# Patient Record
Sex: Female | Born: 1935 | Race: White | Hispanic: No | Smoking: Never smoker
Health system: Southern US, Community
[De-identification: ages and names within clinical notes are randomized; demographics above are authoritative.]

## PROBLEM LIST (undated history)

## (undated) DIAGNOSIS — D369 Benign neoplasm, unspecified site: Secondary | ICD-10-CM

## (undated) DIAGNOSIS — I340 Nonrheumatic mitral (valve) insufficiency: Secondary | ICD-10-CM

## (undated) DIAGNOSIS — N390 Urinary tract infection, site not specified: Secondary | ICD-10-CM

## (undated) DIAGNOSIS — K219 Gastro-esophageal reflux disease without esophagitis: Secondary | ICD-10-CM

## (undated) DIAGNOSIS — M1990 Unspecified osteoarthritis, unspecified site: Secondary | ICD-10-CM

## (undated) DIAGNOSIS — K5730 Diverticulosis of large intestine without perforation or abscess without bleeding: Secondary | ICD-10-CM

## (undated) DIAGNOSIS — K589 Irritable bowel syndrome without diarrhea: Secondary | ICD-10-CM

## (undated) DIAGNOSIS — R55 Syncope and collapse: Secondary | ICD-10-CM

## (undated) DIAGNOSIS — I5032 Chronic diastolic (congestive) heart failure: Secondary | ICD-10-CM

## (undated) DIAGNOSIS — R911 Solitary pulmonary nodule: Secondary | ICD-10-CM

## (undated) DIAGNOSIS — R569 Unspecified convulsions: Secondary | ICD-10-CM

## (undated) DIAGNOSIS — K648 Other hemorrhoids: Secondary | ICD-10-CM

## (undated) DIAGNOSIS — G629 Polyneuropathy, unspecified: Secondary | ICD-10-CM

## (undated) HISTORY — DX: Chronic diastolic (congestive) heart failure: I50.32

## (undated) HISTORY — PX: OTHER SURGICAL HISTORY: SHX169

## (undated) HISTORY — PX: TOTAL ABDOMINAL HYSTERECTOMY: SHX209

## (undated) HISTORY — DX: Other hemorrhoids: K64.8

## (undated) HISTORY — DX: Diverticulosis of large intestine without perforation or abscess without bleeding: K57.30

## (undated) HISTORY — DX: Benign neoplasm, unspecified site: D36.9

## (undated) HISTORY — PX: CHOLECYSTECTOMY: SHX55

## (undated) HISTORY — DX: Irritable bowel syndrome, unspecified: K58.9

## (undated) HISTORY — DX: Polyneuropathy, unspecified: G62.9

## (undated) HISTORY — DX: Solitary pulmonary nodule: R91.1

## (undated) HISTORY — PX: WRIST SURGERY: SHX841

## (undated) HISTORY — DX: Syncope and collapse: R55

## (undated) HISTORY — PX: BACK SURGERY: SHX140

## (undated) HISTORY — PX: APPENDECTOMY: SHX54

## (undated) HISTORY — PX: FOOT SURGERY: SHX648

## (undated) HISTORY — DX: Gastro-esophageal reflux disease without esophagitis: K21.9

## (undated) HISTORY — DX: Nonrheumatic mitral (valve) insufficiency: I34.0

---

## 2001-10-10 ENCOUNTER — Encounter: Admission: RE | Admit: 2001-10-10 | Discharge: 2001-10-10 | Payer: Self-pay | Admitting: Rheumatology

## 2001-10-10 ENCOUNTER — Encounter: Payer: Self-pay | Admitting: Rheumatology

## 2001-12-20 ENCOUNTER — Encounter: Admission: RE | Admit: 2001-12-20 | Discharge: 2001-12-20 | Payer: Self-pay | Admitting: Family Medicine

## 2001-12-20 ENCOUNTER — Encounter (HOSPITAL_COMMUNITY): Admission: RE | Admit: 2001-12-20 | Discharge: 2002-01-19 | Payer: Self-pay | Admitting: Family Medicine

## 2002-01-02 ENCOUNTER — Ambulatory Visit (HOSPITAL_COMMUNITY): Admission: RE | Admit: 2002-01-02 | Discharge: 2002-01-02 | Payer: Self-pay | Admitting: Internal Medicine

## 2002-03-07 ENCOUNTER — Encounter (HOSPITAL_COMMUNITY): Admission: RE | Admit: 2002-03-07 | Discharge: 2002-04-06 | Payer: Self-pay | Admitting: Rheumatology

## 2002-04-25 ENCOUNTER — Encounter (HOSPITAL_COMMUNITY): Admission: RE | Admit: 2002-04-25 | Discharge: 2002-05-25 | Payer: Self-pay | Admitting: Rheumatology

## 2002-12-26 ENCOUNTER — Ambulatory Visit (HOSPITAL_COMMUNITY): Admission: RE | Admit: 2002-12-26 | Discharge: 2002-12-26 | Payer: Self-pay | Admitting: Orthopaedic Surgery

## 2002-12-26 ENCOUNTER — Encounter: Payer: Self-pay | Admitting: Orthopaedic Surgery

## 2003-10-06 ENCOUNTER — Ambulatory Visit (HOSPITAL_COMMUNITY): Admission: RE | Admit: 2003-10-06 | Discharge: 2003-10-06 | Payer: Self-pay | Admitting: Orthopaedic Surgery

## 2003-10-06 ENCOUNTER — Encounter: Payer: Self-pay | Admitting: Orthopaedic Surgery

## 2005-02-04 ENCOUNTER — Encounter: Admission: RE | Admit: 2005-02-04 | Discharge: 2005-02-04 | Payer: Self-pay | Admitting: Vascular Surgery

## 2005-02-14 ENCOUNTER — Ambulatory Visit: Payer: Self-pay | Admitting: Internal Medicine

## 2005-06-06 ENCOUNTER — Ambulatory Visit: Payer: Self-pay | Admitting: Internal Medicine

## 2005-06-16 ENCOUNTER — Ambulatory Visit: Payer: Self-pay | Admitting: Internal Medicine

## 2005-06-16 ENCOUNTER — Ambulatory Visit (HOSPITAL_COMMUNITY): Admission: RE | Admit: 2005-06-16 | Discharge: 2005-06-16 | Payer: Self-pay | Admitting: Internal Medicine

## 2005-06-16 HISTORY — PX: COLONOSCOPY: SHX174

## 2005-06-20 ENCOUNTER — Ambulatory Visit (HOSPITAL_COMMUNITY): Admission: RE | Admit: 2005-06-20 | Discharge: 2005-06-20 | Payer: Self-pay | Admitting: Internal Medicine

## 2005-06-30 ENCOUNTER — Ambulatory Visit (HOSPITAL_COMMUNITY): Admission: RE | Admit: 2005-06-30 | Discharge: 2005-06-30 | Payer: Self-pay | Admitting: Orthopaedic Surgery

## 2005-08-01 ENCOUNTER — Ambulatory Visit: Payer: Self-pay | Admitting: Internal Medicine

## 2005-08-02 ENCOUNTER — Encounter (HOSPITAL_COMMUNITY): Admission: RE | Admit: 2005-08-02 | Discharge: 2005-09-01 | Payer: Self-pay | Admitting: Internal Medicine

## 2005-10-13 ENCOUNTER — Ambulatory Visit: Payer: Self-pay | Admitting: Internal Medicine

## 2005-12-01 ENCOUNTER — Ambulatory Visit: Payer: Self-pay | Admitting: Internal Medicine

## 2006-09-29 ENCOUNTER — Ambulatory Visit: Payer: Self-pay | Admitting: Internal Medicine

## 2006-12-16 ENCOUNTER — Ambulatory Visit: Payer: Self-pay | Admitting: Cardiology

## 2006-12-18 ENCOUNTER — Encounter: Payer: Self-pay | Admitting: Cardiology

## 2006-12-21 ENCOUNTER — Encounter (INDEPENDENT_AMBULATORY_CARE_PROVIDER_SITE_OTHER): Payer: Self-pay | Admitting: Specialist

## 2006-12-21 ENCOUNTER — Ambulatory Visit (HOSPITAL_COMMUNITY): Admission: RE | Admit: 2006-12-21 | Discharge: 2006-12-21 | Payer: Self-pay | Admitting: Internal Medicine

## 2006-12-21 ENCOUNTER — Ambulatory Visit: Payer: Self-pay | Admitting: Internal Medicine

## 2006-12-21 HISTORY — PX: ESOPHAGOGASTRODUODENOSCOPY: SHX1529

## 2007-04-09 ENCOUNTER — Ambulatory Visit: Payer: Self-pay | Admitting: Internal Medicine

## 2007-04-12 ENCOUNTER — Encounter (HOSPITAL_COMMUNITY): Admission: RE | Admit: 2007-04-12 | Discharge: 2007-05-12 | Payer: Self-pay | Admitting: Orthopaedic Surgery

## 2007-11-28 ENCOUNTER — Ambulatory Visit: Payer: Self-pay | Admitting: Internal Medicine

## 2008-02-20 ENCOUNTER — Ambulatory Visit: Payer: Self-pay | Admitting: Cardiology

## 2008-02-22 ENCOUNTER — Ambulatory Visit: Payer: Self-pay | Admitting: Cardiology

## 2008-02-27 ENCOUNTER — Ambulatory Visit: Payer: Self-pay | Admitting: Cardiology

## 2008-02-27 ENCOUNTER — Encounter: Payer: Self-pay | Admitting: Physician Assistant

## 2008-03-10 ENCOUNTER — Ambulatory Visit (HOSPITAL_COMMUNITY): Admission: RE | Admit: 2008-03-10 | Discharge: 2008-03-10 | Payer: Self-pay | Admitting: Orthopaedic Surgery

## 2008-03-13 ENCOUNTER — Ambulatory Visit: Payer: Self-pay | Admitting: Cardiology

## 2008-03-17 ENCOUNTER — Encounter: Payer: Self-pay | Admitting: Cardiology

## 2008-03-31 ENCOUNTER — Ambulatory Visit: Payer: Self-pay | Admitting: Cardiology

## 2008-05-05 ENCOUNTER — Ambulatory Visit: Payer: Self-pay | Admitting: Internal Medicine

## 2008-05-05 ENCOUNTER — Ambulatory Visit: Payer: Self-pay

## 2008-05-15 ENCOUNTER — Ambulatory Visit (HOSPITAL_COMMUNITY): Admission: RE | Admit: 2008-05-15 | Discharge: 2008-05-15 | Payer: Self-pay | Admitting: Internal Medicine

## 2008-05-15 ENCOUNTER — Ambulatory Visit: Payer: Self-pay | Admitting: Internal Medicine

## 2008-06-04 ENCOUNTER — Ambulatory Visit: Payer: Self-pay | Admitting: Internal Medicine

## 2008-06-18 ENCOUNTER — Encounter: Admission: RE | Admit: 2008-06-18 | Discharge: 2008-06-18 | Payer: Self-pay | Admitting: Neurology

## 2008-10-26 ENCOUNTER — Encounter: Payer: Self-pay | Admitting: Cardiology

## 2008-10-27 ENCOUNTER — Encounter: Payer: Self-pay | Admitting: Cardiology

## 2008-12-30 ENCOUNTER — Encounter: Payer: Self-pay | Admitting: Cardiology

## 2009-01-07 ENCOUNTER — Ambulatory Visit: Payer: Self-pay | Admitting: Cardiology

## 2009-01-12 ENCOUNTER — Encounter: Payer: Self-pay | Admitting: Cardiology

## 2009-01-14 ENCOUNTER — Encounter: Payer: Self-pay | Admitting: Cardiology

## 2009-02-17 ENCOUNTER — Encounter: Payer: Self-pay | Admitting: Cardiology

## 2009-02-25 ENCOUNTER — Encounter: Payer: Self-pay | Admitting: Cardiology

## 2009-02-25 ENCOUNTER — Encounter: Payer: Self-pay | Admitting: Urgent Care

## 2009-02-25 ENCOUNTER — Encounter: Payer: Self-pay | Admitting: *Deleted

## 2009-02-25 ENCOUNTER — Ambulatory Visit: Payer: Self-pay | Admitting: Internal Medicine

## 2009-02-25 DIAGNOSIS — K5730 Diverticulosis of large intestine without perforation or abscess without bleeding: Secondary | ICD-10-CM | POA: Insufficient documentation

## 2009-02-25 DIAGNOSIS — K219 Gastro-esophageal reflux disease without esophagitis: Secondary | ICD-10-CM | POA: Insufficient documentation

## 2009-02-25 DIAGNOSIS — L290 Pruritus ani: Secondary | ICD-10-CM | POA: Insufficient documentation

## 2009-02-25 DIAGNOSIS — Z91041 Radiographic dye allergy status: Secondary | ICD-10-CM | POA: Insufficient documentation

## 2009-02-25 DIAGNOSIS — K589 Irritable bowel syndrome without diarrhea: Secondary | ICD-10-CM | POA: Insufficient documentation

## 2009-03-02 ENCOUNTER — Ambulatory Visit: Payer: Self-pay | Admitting: Cardiology

## 2009-04-30 ENCOUNTER — Encounter: Payer: Self-pay | Admitting: Cardiology

## 2009-05-04 ENCOUNTER — Encounter: Payer: Self-pay | Admitting: Gastroenterology

## 2009-05-05 ENCOUNTER — Encounter: Payer: Self-pay | Admitting: Cardiology

## 2009-08-14 ENCOUNTER — Telehealth (INDEPENDENT_AMBULATORY_CARE_PROVIDER_SITE_OTHER): Payer: Self-pay

## 2009-08-14 ENCOUNTER — Encounter: Payer: Self-pay | Admitting: Cardiology

## 2009-08-17 ENCOUNTER — Telehealth (INDEPENDENT_AMBULATORY_CARE_PROVIDER_SITE_OTHER): Payer: Self-pay

## 2009-09-23 DIAGNOSIS — R55 Syncope and collapse: Secondary | ICD-10-CM | POA: Insufficient documentation

## 2009-10-15 ENCOUNTER — Ambulatory Visit: Payer: Self-pay | Admitting: Cardiology

## 2009-10-15 DIAGNOSIS — K2289 Other specified disease of esophagus: Secondary | ICD-10-CM | POA: Insufficient documentation

## 2009-10-15 DIAGNOSIS — K228 Other specified diseases of esophagus: Secondary | ICD-10-CM

## 2009-10-15 DIAGNOSIS — J984 Other disorders of lung: Secondary | ICD-10-CM | POA: Insufficient documentation

## 2009-10-15 DIAGNOSIS — K449 Diaphragmatic hernia without obstruction or gangrene: Secondary | ICD-10-CM | POA: Insufficient documentation

## 2009-10-15 DIAGNOSIS — H8110 Benign paroxysmal vertigo, unspecified ear: Secondary | ICD-10-CM | POA: Insufficient documentation

## 2009-10-22 ENCOUNTER — Ambulatory Visit: Payer: Self-pay | Admitting: Internal Medicine

## 2009-10-23 ENCOUNTER — Ambulatory Visit: Payer: Self-pay | Admitting: Internal Medicine

## 2009-10-23 DIAGNOSIS — R198 Other specified symptoms and signs involving the digestive system and abdomen: Secondary | ICD-10-CM | POA: Insufficient documentation

## 2009-10-28 ENCOUNTER — Ambulatory Visit (HOSPITAL_COMMUNITY): Admission: RE | Admit: 2009-10-28 | Discharge: 2009-10-28 | Payer: Self-pay | Admitting: Internal Medicine

## 2009-10-30 ENCOUNTER — Encounter: Payer: Self-pay | Admitting: Internal Medicine

## 2009-11-06 ENCOUNTER — Encounter: Payer: Self-pay | Admitting: Cardiology

## 2009-11-16 ENCOUNTER — Encounter: Payer: Self-pay | Admitting: Gastroenterology

## 2009-11-16 ENCOUNTER — Encounter: Payer: Self-pay | Admitting: Cardiology

## 2009-11-24 ENCOUNTER — Encounter: Payer: Self-pay | Admitting: Internal Medicine

## 2009-11-25 ENCOUNTER — Encounter: Admission: RE | Admit: 2009-11-25 | Discharge: 2009-12-17 | Payer: Self-pay | Admitting: Cardiology

## 2009-12-15 ENCOUNTER — Telehealth (INDEPENDENT_AMBULATORY_CARE_PROVIDER_SITE_OTHER): Payer: Self-pay | Admitting: *Deleted

## 2009-12-19 DIAGNOSIS — R569 Unspecified convulsions: Secondary | ICD-10-CM

## 2009-12-19 HISTORY — DX: Unspecified convulsions: R56.9

## 2009-12-25 ENCOUNTER — Encounter: Payer: Self-pay | Admitting: Cardiology

## 2009-12-29 ENCOUNTER — Encounter: Payer: Self-pay | Admitting: Cardiology

## 2010-01-20 ENCOUNTER — Encounter: Payer: Self-pay | Admitting: Cardiology

## 2010-01-26 ENCOUNTER — Encounter: Payer: Self-pay | Admitting: Cardiology

## 2010-02-02 ENCOUNTER — Encounter: Payer: Self-pay | Admitting: Cardiology

## 2010-05-27 ENCOUNTER — Encounter: Payer: Self-pay | Admitting: Cardiology

## 2010-06-10 ENCOUNTER — Ambulatory Visit: Payer: Self-pay | Admitting: Cardiology

## 2010-06-10 DIAGNOSIS — I38 Endocarditis, valve unspecified: Secondary | ICD-10-CM | POA: Insufficient documentation

## 2010-06-10 DIAGNOSIS — I2789 Other specified pulmonary heart diseases: Secondary | ICD-10-CM | POA: Insufficient documentation

## 2010-08-03 ENCOUNTER — Encounter: Payer: Self-pay | Admitting: Urgent Care

## 2010-08-04 ENCOUNTER — Encounter (INDEPENDENT_AMBULATORY_CARE_PROVIDER_SITE_OTHER): Payer: Self-pay | Admitting: *Deleted

## 2010-08-04 ENCOUNTER — Encounter: Payer: Self-pay | Admitting: Gastroenterology

## 2010-08-24 ENCOUNTER — Ambulatory Visit: Payer: Self-pay | Admitting: Internal Medicine

## 2010-08-24 DIAGNOSIS — R109 Unspecified abdominal pain: Secondary | ICD-10-CM | POA: Insufficient documentation

## 2010-08-25 ENCOUNTER — Telehealth (INDEPENDENT_AMBULATORY_CARE_PROVIDER_SITE_OTHER): Payer: Self-pay | Admitting: *Deleted

## 2010-08-26 ENCOUNTER — Encounter: Payer: Self-pay | Admitting: Cardiology

## 2010-08-31 ENCOUNTER — Encounter: Payer: Self-pay | Admitting: Cardiology

## 2010-09-01 ENCOUNTER — Ambulatory Visit: Payer: Self-pay | Admitting: Internal Medicine

## 2010-09-02 ENCOUNTER — Encounter (INDEPENDENT_AMBULATORY_CARE_PROVIDER_SITE_OTHER): Payer: Self-pay

## 2010-09-03 ENCOUNTER — Encounter: Payer: Self-pay | Admitting: Internal Medicine

## 2010-09-03 LAB — CONVERTED CEMR LAB
Albumin: 3.9 g/dL (ref 3.5–5.2)
BUN: 13 mg/dL (ref 6–23)
CO2: 28 meq/L (ref 19–32)
Calcium: 9.6 mg/dL (ref 8.4–10.5)
Chloride: 109 meq/L (ref 96–112)
Creatinine, Ser: 0.79 mg/dL (ref 0.40–1.20)
Glucose, Bld: 60 mg/dL — ABNORMAL LOW (ref 70–99)
Hemoglobin: 14.4 g/dL (ref 12.0–15.0)
Lymphocytes Relative: 32 % (ref 12–46)
Lymphs Abs: 1.8 10*3/uL (ref 0.7–4.0)
MCHC: 32.5 g/dL (ref 30.0–36.0)
Monocytes Absolute: 0.5 10*3/uL (ref 0.1–1.0)
Monocytes Relative: 8 % (ref 3–12)
Neutro Abs: 3.1 10*3/uL (ref 1.7–7.7)
Neutrophils Relative %: 54 % (ref 43–77)
Potassium: 4.4 meq/L (ref 3.5–5.3)
RBC: 5.08 M/uL (ref 3.87–5.11)
TSH: 5.694 microintl units/mL — ABNORMAL HIGH (ref 0.350–4.500)
WBC: 5.7 10*3/uL (ref 4.0–10.5)

## 2010-09-06 ENCOUNTER — Encounter: Payer: Self-pay | Admitting: Gastroenterology

## 2010-09-16 ENCOUNTER — Encounter: Payer: Self-pay | Admitting: Cardiology

## 2010-09-17 ENCOUNTER — Encounter: Payer: Self-pay | Admitting: Cardiology

## 2010-09-22 ENCOUNTER — Encounter (INDEPENDENT_AMBULATORY_CARE_PROVIDER_SITE_OTHER): Payer: Self-pay | Admitting: *Deleted

## 2010-11-18 ENCOUNTER — Ambulatory Visit: Payer: Self-pay | Admitting: Internal Medicine

## 2010-12-15 ENCOUNTER — Encounter: Payer: Self-pay | Admitting: Cardiology

## 2010-12-15 ENCOUNTER — Ambulatory Visit: Payer: Self-pay | Admitting: Cardiology

## 2011-01-18 NOTE — Assessment & Plan Note (Signed)
Summary: follow up visit - cdg   Visit Type:  Follow-up Visit Primary Care Provider:  sassor  Chief Complaint:  follow up- abd pain before and after bm.  History of Present Illness: Followup GERD. No reflux symptoms - taking Dexilant 60 mg orally twice daily. Cardiology stopped her flu cortisone recently. She lost her sister with whom she was very close back in July of this year. She states she's not been hungry. She has lost 10 pounds since November of last year. Upper GI series demonstrated a moderate size hiatal hernia and esophageal dysmotility. Stool was negative for blood. Last colonoscopy 2006-diverticulosis. Has some vague midabdominal pain from time to time; states she does not get hungry. Gallbladder is out. No nausea or vomiting melena or hematochezia.  Current Medications (verified): 1)  Colace 100 Mg Caps (Docusate Sodium) .... Take One By Mouth On Mon-Wed-Fri 2)  Levothyroxine Sodium 50 Mcg Tabs (Levothyroxine Sodium) .... Take 1 Tablet By Mouth Once A Day 3)  [facility] Spectravite Senior  Tabs (Multiple Vitamins-Minerals) .... Take 1 Tablet By Mouth Once A Day 4)  Calcium Carbonate 600 Mg Tabs (Calcium Carbonate) .... One By Mouth Daily 5)  Triple Flex 500-400-125 Mg Tabs (Glucosamine-Chondroitin-Msm) .... Take 1 Tablet By Mouth Once A Day 6)  Folic Acid 800 Mcg Tabs (Folic Acid) .... Take 1 Tablet By Mouth Once A Day 7)  Dexilant 60 Mg Cpdr (Dexlansoprazole) .... Take 1 Tablet By Mouth Two Times A Day 8)  Topiramate 25 Mg Tabs (Topiramate) .Marland Kitchen.. 1 By Mouth Qam & 2 By Mouth Qpm 9)  Vitamin D 400 Unit Caps (Cholecalciferol) .... Take 1 Tablet By Mouth Once A Day 10)  [facility]vs Cranberry 475 Mg Caps (Cranberry) .... Once Daily 11)  Flonase 50 Mcg/act Susp (Fluticasone Propionate) .... As Needed 12)  Align  Caps (Probiotic Product) .... Once Daily 13)  Lidocaine-Hydrocortisone Ace 3-0.5 % Crea (Lidocaine-Hydrocortisone Ace) .... As Needed 14)  Gabapentin 300 Mg Caps (Gabapentin) .... Take 1  Tablet By Mouth Once A Day 15)  Singulair 10 Mg Tabs (Montelukast Sodium) .... Take 1 Tablet By Mouth Once A Day 16)  Vitamin C 500 Mg Tabs (Ascorbic Acid) .... Take 1 Tablet By Mouth Once A Day 17)  Super B Complex  Tabs (B Complex-C) .... Take 1 Tablet By Mouth Once A Day 18)  Loratadine 10 Mg Tabs (Loratadine) .... Once Daily  Allergies (verified): 1)  ! Indocin 2)  ! Cephalexin 3)  ! Morphine 4)  ! Penicillin 5)  ! Codeine 6)  ! Levaquin 7)  ! * Ivp Dye 8)  ! Levaquin 9)  ! Bactrim 10)  * Contrast Dye  Past History:  Past Medical History: Last updated: 10/15/2009 SYNCOPE AND COLLAPSE (ICD-780.2) CONTRAST DYE ALLERGY (ICD-V15.08) DIVERTICULOSIS OF COLON (ICD-562.10) PRURITUS ANI (ICD-698.0) IRRITABLE BOWEL SYNDROME (ICD-564.1) GERD (ICD-530.81) Peripheral polyneuropathy Migraine headaches  1. Syncope with recurrence.   2. Positive tilt-table test on Florinef.   3. Peripheral polyneuropathy confirmed by EMG and nerve conduction       velocities.   4. Migraine headaches.   5. Ruled out for pulmonary embolism in 2007.   6. No evidence of structural heart disease.   Past Surgical History: Last updated: 09/23/2009 wrist foot Abdominal Hysterectomy-Total Appendectomy Back Surgery  Family History: Last updated: September 10, 2010 Father: deceased Mother: deceased Siblings: 6 brothers- 1 living, 5 sisters- 2 living No FH of Colon Cancer:  Social History: Last updated: 09/23/2009 Retired  Widowed  Tobacco Use - No.  Alcohol Use - no Regular Exercise - yes Drug Use - no  Risk Factors: Exercise: yes (09/23/2009)  Risk Factors: Smoking Status: never (06/10/2010)  Family History: Father: deceased Mother: deceased Siblings: 6 brothers- 1 living, 5 sisters- 2 living No FH of Colon Cancer:  Vital Signs:  Patient profile:   77 year old female Height:      70 inches Weight:      202 pounds BMI:     29.09 Temp:     97.8 degrees F oral Pulse rate:   68 /  minute BP sitting:   108 / 70  (left arm) Cuff size:   regular  Vitals Entered By: Hendricks Limes LPN (August 24, 2010 8:49 AM)  Physical Exam  General:  pleasant alert or elderly lady in no distress Lungs:  clear to auscultation Heart:  regular rate rhythm without murmur gallop rub Abdomen:  flat positive bowel sounds soft nontender without appreciable mass or organomegaly  Impression & Recommendations: Impression: Pleasant elderly lady with long-standing GERD well-controlled on b.i.d. Dexalant.  Some anorexia recently with the loss of her sister. States she  just doesn't  get hungry. Has lost 10 pounds in the past 9 months.   GI evaluation to date as outlined above. I wonder if cessation of fludrocortisone has anything to do with her symptoms. Very vague abdominal pain doesn't appear to be significant at this time.  Recommendations: She may stop  taking her morning dose of Dexalant; continue taking Dexalant 60 mg orally in the evening before supper nightly. Check IFOBT, Chem-20, TSH and CBC. follow-up visit here 3 months. If she continues to have intermittent dizziness, she is urged to get back with Drs. DeGent and Sasser.  Other Orders: T-Comprehensive Metabolic Panel [id number]) T-CBC w/Diff [id number]) T-TSH [phone])  Appended Document: Orders Update    Clinical Lists Changes  Orders: Added new Service order of Est. Patient Level III (32440) - Signed

## 2011-01-18 NOTE — Letter (Signed)
Summary: Engineer, materials at [facility]  [address]   [place], [place] [address]   Phone: [phone]  Fax: [phone]        September 22, 2010 MRN: [id number]   Ladasia Dix [address] [place], [place]  [address]   Dear [name],  Your test ordered by Selena Batten has been reviewed by your physician (or physician assistant) and was found to be normal or stable. Your physician (or physician assistant) felt no changes were needed at this time.  ____ Echocardiogram  ____ Cardiac Stress Test  __X__ Lab Work - cortisol levels within normal limits   ____ Peripheral vascular study of arms, legs or neck  ____ CT scan or X-ray  ____ Lung or Breathing test  ____ Other:   Thank you.   [name], LPN    Duane Boston, M.D., F.A.C.C. Thressa Sheller, M.D., F.A.C.C. Oneal Grout, M.D., F.A.C.C. Cheree Ditto, M.D., F.A.C.C. Daiva Nakayama, M.D., F.A.C.C. Kenney Houseman, M.D., F.A.C.C. Jeanne Ivan, PA-C

## 2011-01-18 NOTE — Assessment & Plan Note (Signed)
Summary: 3 MO FU W/RMR/GERD,ABD PAIN/SS   Visit Type:  Follow-up Visit Primary Care Provider:  sasser  Chief Complaint:  follow up- was doing good untill thanksgiving- ate some chocolate.  History of Present Illness: 77 year old lady followup GERD. Patient has done extremely well on Dexalant 60 mg orally daily taken in the evenings. No dysphagia or odynaphagia. She states she overinduldged over Thanksgiving and ate  some chocolate ice cream  and developed reflux symptoms following.  She notes some constipation with Vesicare. She also notes a dry mouth.  Her cardiologist has put her back on Florinef.  Current Problems (verified): 1)  Abdominal Pain  (ICD-789.00) 2)  Hypertension, Pulmonary  (ICD-416.8) 3)  Valvular Heart Disease  (ICD-424.90) 4)  Other Symptoms Involving Digestive System Other  (ICD-787.99) 5)  Other Specified Disorder of The Esophagus  (ICD-530.89) 6)  Hiatal Hernia With Reflux  (ICD-553.3) 7)  Pulmonary Nodule  (ICD-518.89) 8)  Benign Positional Vertigo  (ICD-386.11) 9)  Vasovagal Syncope  (ICD-780.2) 10)  Syncope and Collapse  (ICD-780.2) 11)  Contrast Dye Allergy  (ICD-V15.08) 12)  Diverticulosis of Colon  (ICD-562.10) 13)  Pruritus Ani  (ICD-698.0) 14)  Irritable Bowel Syndrome  (ICD-564.1) 15)  Gerd  (ICD-530.81)  Current Medications (verified): 1)  Colace 100 Mg Caps (Docusate Sodium) .... Take One By Mouth On Mon-Wed-Fri 2)  Levothyroxine Sodium 50 Mcg Tabs (Levothyroxine Sodium) .... Take 1 Tablet By Mouth Once A Day 3)  [facility] Spectravite Senior  Tabs (Multiple Vitamins-Minerals) .... Take 1 Tablet By Mouth Once A Day 4)  Calcium Carbonate 600 Mg Tabs (Calcium Carbonate) .... One By Mouth Daily 5)  Triple Flex 500-400-125 Mg Tabs (Glucosamine-Chondroitin-Msm) .... Take 1 Tablet By Mouth Once A Day 6)  Folic Acid 800 Mcg Tabs (Folic Acid) .... Take 1 Tablet By Mouth Once A Day 7)  Dexilant 60 Mg Cpdr (Dexlansoprazole) .... One By Mouth Each Evening 8)   Topiramate 25 Mg Tabs (Topiramate) .Marland Kitchen.. 1 By Mouth Qam & 2 By Mouth Qpm 9)  Vitamin D 400 Unit Caps (Cholecalciferol) .... Take 1 Tablet By Mouth Once A Day 10)  [facility] Cranberry 475 Mg Caps (Cranberry) .... Once Daily 11)  Flonase 50 Mcg/act Susp (Fluticasone Propionate) .... As Needed 12)  Align  Caps (Probiotic Product) .... Once Daily 13)  Lidocaine-Hydrocortisone Ace 3-0.5 % Crea (Lidocaine-Hydrocortisone Ace) .... As Needed 14)  Gabapentin 300 Mg Caps (Gabapentin) .... Take 1 Tablet By Mouth Once A Day 15)  Singulair 10 Mg Tabs (Montelukast Sodium) .... Take 1 Tablet By Mouth Once A Day 16)  Vitamin C 500 Mg Tabs (Ascorbic Acid) .... Take 1 Tablet By Mouth Once A Day 17)  Super B Complex  Tabs (B Complex-C) .... Take 1 Tablet By Mouth Once A Day 18)  Loratadine 10 Mg Tabs (Loratadine) .... Once Daily 19)  Fludrocortisone Acetate 0.1 Mg Tabs (Fludrocortisone Acetate) .... Take One Tab On Monday, Wednesday, & Friday 20)  Vesicare 10 Mg Tabs (Solifenacin Succinate) .... Once Daily  Allergies (verified): 1)  ! Indocin 2)  ! Cephalexin 3)  ! Morphine 4)  ! Penicillin 5)  ! Codeine 6)  ! Levaquin 7)  ! * Ivp Dye 8)  ! Levaquin 9)  ! Bactrim 10)  * Contrast Dye  Past History:  Past Medical History: Last updated: 10/15/2009 SYNCOPE AND COLLAPSE (ICD-780.2) CONTRAST DYE ALLERGY (ICD-V15.08) DIVERTICULOSIS OF COLON (ICD-562.10) PRURITUS ANI (ICD-698.0) IRRITABLE BOWEL SYNDROME (ICD-564.1) GERD (ICD-530.81) Peripheral polyneuropathy Migraine headaches  1. Syncope with recurrence.  2. Positive tilt-table test on Florinef.   3. Peripheral polyneuropathy confirmed by EMG and nerve conduction       velocities.   4. Migraine headaches.   5. Ruled out for pulmonary embolism in 2007.   6. No evidence of structural heart disease.   Past Surgical History: Last updated: 09/23/2009 wrist foot Abdominal Hysterectomy-Total Appendectomy Back Surgery  Family History: Last updated:  Sep 08, 2010 Father: deceased Mother: deceased Siblings: 6 brothers- 1 living, 5 sisters- 2 living No FH of Colon Cancer:  Social History: Last updated: 09/23/2009 Retired  Widowed  Tobacco Use - No.  Alcohol Use - no Regular Exercise - yes Drug Use - no  Risk Factors: Exercise: yes (09/23/2009)  Risk Factors: Smoking Status: never (06/10/2010)  Vital Signs:  Patient profile:   77 year old female Height:      70 inches Weight:      205 pounds BMI:     29.52 Temp:     97.6 degrees F oral Pulse rate:   72 / minute BP sitting:   124 / 80  (left arm) Cuff size:   regular  Vitals Entered By: Hendricks Limes LPN (November 18, 2010 9:53 AM)  Physical Exam  General:  pleasant alert lady appears her baseline Abdomen:  somewhat obese positive bowel sounds soft, nontender without appreciable mass or organomegaly  Impression & Recommendations: Impression: 77 year old lady with long-standing GERD, symptoms really controlled overall very well with Dexalant 60 mg daily taken in the evening -  better than any prior regimens.  Recent constipation most likely related to the anticholinergic effects of Vesicare.  Recommendations: Continue Dexalant 60 mg orally daily indefinitely - samples provided.  Add Colace 100 mg orally b.i.d. to her regimen. I've also recommended MiraLax 17 g orally each day-  if no bowel movement on any given day..  Unless something comes up, plan to see this nice lady back in one year.  Appended Document: Orders Update    Clinical Lists Changes  Orders: Added new Service order of Est. Patient Level IV (85277) - Signed

## 2011-01-18 NOTE — Miscellaneous (Signed)
Summary: Orders Update - AM CORTISOL,SERUM ALDOSTERONE, PRA  Clinical Lists Changes  Orders: Added new Test order of T- * Misc. Laboratory test [phone]) - Signed

## 2011-01-18 NOTE — Medication Information (Signed)
Summary: DEXILANT DR 60 MG   DEXILANT DR 60 MG   Imported By: Rexene Alberts 08/04/2010 09:32:01  _____________________________________________________________________  External Attachment:    Type:   Image     Comment:   External Document  Appended Document: DEXILANT DR 60 MG  pt has an appt to see Dr Jena Gauss on 08/24/10- cdg

## 2011-01-18 NOTE — Letter (Signed)
Summary: Normal Results Letter  [facility]  [address]   [place], [place] [address]   Phone: [phone]  Fax: [phone]    September 02, 2010  [name] [address] [place], [place]  [address] 1930/02/08   Dear [name],   Our office has been trying to contact you.  We just wanted to inform you that your stool test was normal.  If you have any questions, please call the office at [phone].   Thank you,    Hendricks Limes, LPN Cloria Spring, LPN  [facility] Ph: [phone]   Fax: [phone]

## 2011-01-18 NOTE — Letter (Signed)
Summary: [facility]  [facility]   Imported By: Zachary George 01/20/2010 11:32:20  _____________________________________________________________________  External Attachment:    Type:   Image     Comment:   External Document

## 2011-01-18 NOTE — Medication Information (Signed)
Summary: DEXILANT DR 60MG   DEXILANT DR 60MG    Imported By: Rexene Alberts 08/04/2010 10:05:06  _____________________________________________________________________  External Attachment:    Type:   Image     Comment:   External Document  Appended Document: DEXILANT DR 60MG     Prescriptions: DEXILANT 60 MG CPDR (DEXLANSOPRAZOLE) Take 1 tablet by mouth two times a day  #60 x 0   Entered and Authorized by:   Leanna Battles. Dixon Boos   Signed by:   Leanna Battles Lewis PA-C on 08/04/2010   Method used:   [facility]  [address] [phone]* (retail)       [address]       [place], [place]  [address]       Ph: [phone]       Fax: [phone]   RxID:   [id number]

## 2011-01-18 NOTE — Letter (Signed)
Summary: [facility] Fara Chute  [facility] DR. PAUL SASSER   Imported By: Zachary George 06/10/2010 11:14:15  _____________________________________________________________________  External Attachment:    Type:   Image     Comment:   External Document

## 2011-01-18 NOTE — Miscellaneous (Signed)
Summary: Rehab Report/ THERAPY EVALUATION FAXED  Rehab Report/ THERAPY EVALUATION FAXED   Imported By: Dorise Hiss 01/28/2010 14:02:04  _____________________________________________________________________  External Attachment:    Type:   Image     Comment:   External Document

## 2011-01-18 NOTE — Medication Information (Signed)
Summary: DEXILANT DR 60MG   DEXILANT DR 60MG    Imported By: Rexene Alberts 08/03/2010 09:31:07  _____________________________________________________________________  External Attachment:    Type:   Image     Comment:   External Document  Appended Document: DEXILANT DR 60MG  needs OV 1st or can get from PCP.  NS x 2 previously.  Appended Document: DEXILANT DR 60MG  MAILED LETTER FOR PT TO CALL FOR APPT

## 2011-01-18 NOTE — Miscellaneous (Signed)
Summary: update med list - Florinef  Clinical Lists Changes  Medications: Added new medication of FLUDROCORTISONE ACETATE 0.1 MG TABS (FLUDROCORTISONE ACETATE) take one tab on Monday, Wednesday, & Friday

## 2011-01-18 NOTE — Assessment & Plan Note (Signed)
Summary: 6 month fu-recv call -vs  Medications Added COLACE 100 MG CAPS (DOCUSATE SODIUM) take one by mouth on Mon-Wed-Fri [facility] SPECTRAVITE SENIOR  TABS (MULTIPLE VITAMINS-MINERALS) Take 1 tablet by mouth once a day TRIPLE FLEX 500-400-125 MG TABS (GLUCOSAMINE-CHONDROITIN-MSM) Take 1 tablet by mouth once a day FOLIC ACID 800 MCG TABS (FOLIC ACID) Take 1 tablet by mouth once a day VITAMIN D 400 UNIT CAPS (CHOLECALCIFEROL) Take 1 tablet by mouth once a day GABAPENTIN 300 MG CAPS (GABAPENTIN) Take 1 tablet by mouth once a day SINGULAIR 10 MG TABS (MONTELUKAST SODIUM) Take 1 tablet by mouth once a day VITAMIN C 500 MG TABS (ASCORBIC ACID) Take 1 tablet by mouth once a day SUPER B COMPLEX  TABS (B COMPLEX-C) Take 1 tablet by mouth once a day        Visit Type:  Follow-up Primary Provider:  Drue Dun   History of Present Illness: 77 year old female, history of neurocardiogenic syncope, normal LVF, mild mitral regurgitation, and moderate pulmonary hypertension, presents for scheduled 6 month followup.  At time of last visit, Dr. Andee Lineman referred her for a BPV evaluation of her persistent dizziness. They concluded that her symptoms were not consistent with BPV, but that she appeared to have balance and vestibular deficits. She informs me today that she was also told that physical therapy would not be beneficial. She has not had any recurrent syncope, since her last visit here. However, she continues to experience intermittent gait instability, and occasionally uses a cane. She denies any recent falls.  Dr. Andee Lineman also referred her for a followup CT angiogram of the chest, for previously noted lung nodule. This indicated no evidence of neoplasm, and no further followup was recommended. A previously noted nodule in the right lower lobe was felt to be due to old infection.  Preventive Screening-Counseling & Management  Alcohol-Tobacco     Smoking Status: never  Current Medications  (verified): 1)  Colace 100 Mg Caps (Docusate Sodium) .... Take One By Mouth On Mon-Wed-Fri 2)  Levothyroxine Sodium 50 Mcg Tabs (Levothyroxine Sodium) .... Take 1 Tablet By Mouth Once A Day 3)  [facility] Spectravite Senior  Tabs (Multiple Vitamins-Minerals) .... Take 1 Tablet By Mouth Once A Day 4)  Calcium Carbonate 600 Mg Tabs (Calcium Carbonate) .... One By Mouth Daily 5)  Triple Flex 500-400-125 Mg Tabs (Glucosamine-Chondroitin-Msm) .... Take 1 Tablet By Mouth Once A Day 6)  Folic Acid 800 Mcg Tabs (Folic Acid) .... Take 1 Tablet By Mouth Once A Day 7)  Dexilant 60 Mg Cpdr (Dexlansoprazole) .... Take 1 Tablet By Mouth Two Times A Day 8)  Dilantin 100 Mg Caps (Phenytoin Sodium Extended) .... 2 By Mouth Once Daily Bedtime 9)  Topiramate 25 Mg Tabs (Topiramate) .Marland Kitchen.. 1 By Mouth Qam & 2 By Mouth Qpm 10)  Fludrocortisone Acetate 0.1 Mg Tabs (Fludrocortisone Acetate) .... One  On Mon Wed &fri 11)  Vitamin D 400 Unit Caps (Cholecalciferol) .... Take 1 Tablet By Mouth Once A Day 12)  [facility]vs Cranberry 475 Mg Caps (Cranberry) .... Once Daily 13)  Flonase 50 Mcg/act Susp (Fluticasone Propionate) .... As Needed 14)  Align  Caps (Probiotic Product) .... Once Daily 15)  Lidocaine-Hydrocortisone Ace 3-0.5 % Crea (Lidocaine-Hydrocortisone Ace) .... As Needed 16)  Gabapentin 300 Mg Caps (Gabapentin) .... Take 1 Tablet By Mouth Once A Day 17)  Singulair 10 Mg Tabs (Montelukast Sodium) .... Take 1 Tablet By Mouth Once A Day 18)  Vitamin C 500 Mg Tabs (Ascorbic Acid) .Marland KitchenMarland KitchenMarland Kitchen  Take 1 Tablet By Mouth Once A Day 19)  Super B Complex  Tabs (B Complex-C) .... Take 1 Tablet By Mouth Once A Day  Allergies: 1)  ! Indocin 2)  ! Cephalexin 3)  ! Morphine 4)  ! Penicillin 5)  ! Codeine 6)  ! Levaquin 7)  ! * Ivp Dye 8)  ! Levaquin 9)  ! Bactrim 10)  * Contrast Dye  Comments:  Nurse/Medical Assistant: The patient's medication list and allergies were reviewed with the patient and were updated in the Medication and Allergy  Lists.  Past History:  Past Medical History: Last updated: 10/15/2009 SYNCOPE AND COLLAPSE (ICD-780.2) CONTRAST DYE ALLERGY (ICD-V15.08) DIVERTICULOSIS OF COLON (ICD-562.10) PRURITUS ANI (ICD-698.0) IRRITABLE BOWEL SYNDROME (ICD-564.1) GERD (ICD-530.81) Peripheral polyneuropathy Migraine headaches  1. Syncope with recurrence.   2. Positive tilt-table test on Florinef.   3. Peripheral polyneuropathy confirmed by EMG and nerve conduction       velocities.   4. Migraine headaches.   5. Ruled out for pulmonary embolism in 2007.   6. No evidence of structural heart disease.   Review of Systems  The patient denies chest pain, syncope, and dyspnea on exertion.         denies tachypalpitations. Remaining systems reviewed, and are negative.  Vital Signs:  Patient profile:   77 year old female Height:      70 inches Weight:      203 pounds Pulse rate:   70 / minute BP sitting:   141 / 115  (left arm) Cuff size:   large  Vitals Entered By: Carlye Grippe (June 10, 2010 10:40 AM)  Serial Vital Signs/Assessments:  Time      Position  BP       Pulse  Resp  Temp     By 11:20 AM  R Arm     109/74                         Cyril Loosen, RN, BSN 11:20 AM  L Arm     106/72                         Cyril Loosen, RN, BSN   Physical Exam  Additional Exam:  GEN:77 year old female, sitting upright, in no distress HEENT: NCAT,PERRLA,EOMI NECK: palpable pulses, no bruits; no JVD; no TM LUNGS: CTA bilaterally HEART: RRR (S1S2); no significant murmurs; no rubs; no gallops ABD: soft, NT; intact BS EXT: intact distal pulses; no edema SKIN: warm, dry MUSC: no obvious deformity NEURO: A/O (x3)     Impression & Recommendations:  Problem # 1:  SYNCOPE AND COLLAPSE (ICD-780.2) no recurrent episodes, since last visit here. Subsequent evaluation for BPV proved negative, but suggested balance and vestibular deficits. Patient does suggest symptoms of gait instability, but no recent falls.  She occasionally ambulates with the use of a cane. No further cardiac workup at this time. Will schedule a return clinic visit with Dr. Andee Lineman in 6 months.  Problem # 2:  PULMONARY NODULE (ICD-518.89) subsequent followup CT angiogram of the chest was negative for evidence of neoplasm; no further workup was recommended.  Problem # 3:  VALVULAR HEART DISEASE (ICD-424.90) Previous echocardiogram from October '10 indicated mild MR. We'll continue to monitor clinically.   Problem # 4:  HYPERTENSION, PULMONARY (ICD-416.8) Assessed as moderate by 2-D echo, 8/10. Continue to monitor closely.  Patient Instructions: 1)  Your physician wants you to follow-up  in: 6 months. You will receive a reminder letter in the mail one-two months in advance. If you don't receive a letter, please call our office to schedule the follow-up appointment. 2)  Your physician recommends that you continue on your current medications as directed. Please refer to the Current Medication list given to you today.

## 2011-01-18 NOTE — Letter (Signed)
Summary: Plan of Care, Need to Discuss  [facility]  [address]   [place], [place] [address]   Phone: [phone]  Fax: [phone]    September 02, 2010  [name] [address] [place], [place]  [address] May 02, 1930   Dear [name],   We are writing this letter to inform you of treatment plans and/or discuss your plan of care.  We have tried several times to contact you; however, we have yet to reach you.  We ask that you please contact our office for follow-up on your gastrointestinal issues.  We can  be reached at [phone] to schedule an appointment, or to speak with someone regarding your health care needs.  Please do not neglect your health.   Sincerely,    Cloria Spring LPN  [facility] Ph: [phone]    Fax: [phone]

## 2011-01-18 NOTE — Medication Information (Signed)
Summary: [name] DR  [name] DR   Imported By: Rexene Alberts 09/06/2010 10:10:47  _____________________________________________________________________  External Attachment:    Type:   Image     Comment:   External Document  Appended Document: DEXILANT DR    Prescriptions: DEXILANT 60 MG CPDR (DEXLANSOPRAZOLE) one by mouth each evening  #30 x 11   Entered and Authorized by:   Leanna Battles. Dixon Boos   Signed by:   Leanna Battles Lewis PA-C on 09/06/2010   Method used:   [facility]        [facility]  [address] [id number]       [address]       [place], [place]  [address]       Ph: [phone] or [phone]       Fax: [phone]   RxID:   [id number]

## 2011-01-18 NOTE — Progress Notes (Signed)
Summary: restart Florinef   Phone Note Call from Patient   Summary of Call: Wanted return call, stated she had question for Dr. Andee Lineman.  Left message to return call.  [name], LPN  August 25, 2010 10:02 AM   Follow-up for Phone Call        Saw PA in June and said he did not mention whether or not she was to continue her Florinef.  States her bottle had no refills, MD must authorize.  She did not ask this at her OV.  Advised her that typically that means that she just needs to call the office to request refill and that it does not mean that she is to stop med.  Stated that she was just not comfortable restarting until she checks with Dr. Andee Lineman first.  Please advise, not due for f/u again till December.   Follow-up by: [name], LPN,  August 27, 2010 10:47 AM  Additional Follow-up for Phone Call Additional follow up Details #1::        Would keep on Florinef 0.1 mg by mouth M/W/F as previously taken.  Additional Follow-up by: [name], MD, [facility],  August 31, 2010 6:40 AM    Additional Follow-up for Phone Call Additional follow up Details #2::    Patient notified.   Advised 6 refills were approved on 6/29 to [facility]  States she wanted to pick up in [place] today.  Advised her that she can call and have transferred [facility] another [facility] location herself since refill has already been done.  Patient verbalized understanding.  Follow-up by: [name], LPN,  August 31, 2010 1:38 PM

## 2011-01-18 NOTE — Assessment & Plan Note (Signed)
Summary: DROPPED OFF STOOL/SS   Pt returned one iFOBT and it was negative.    Allergies: 1)  ! Indocin 2)  ! Cephalexin 3)  ! Morphine 4)  ! Penicillin 5)  ! Codeine 6)  ! Levaquin 7)  ! * Ivp Dye 8)  ! Levaquin 9)  ! Bactrim 10)  * Contrast Dye  Other Orders: Immuno-chemical Fecal Occult ([address])  Appended Document: DROPPED OFF STOOL/SS stool negative for blood; keep f/u appt as planned  Appended Document: DROPPED OFF STOOL/SS DS has been trying to contact pt for several days. Will mail letter to pt  Appended Document: DROPPED OFF STOOL/SS pt aware

## 2011-01-18 NOTE — Miscellaneous (Signed)
Summary: Rehab Report/ CARDIAC REHAB DISCHARGE SUMMARY [facility]   Rehab Report/ CARDIAC REHAB DISCHARGE SUMMARY [facility]   Imported By: Dorise Hiss 05/27/2010 15:02:18  _____________________________________________________________________  External Attachment:    Type:   Image     Comment:   External Document

## 2011-01-18 NOTE — Letter (Signed)
Summary: Recall Office Visit  [facility]  [address]   [place], [place] [address]   Phone: [phone]  Fax: [phone]      August 04, 2010   Marji Buzby [address] [place], [place]  [address] Apr 25, 1930   Dear [name],   According to our records, it is time for you to schedule a follow-up office visit with [place].   At your convenience, please call [phone] to schedule an office visit. If you have any questions, concerns, or feel that this letter is in error, we would appreciate your call.   Sincerely,    Diana Eves  [facility] Ph: [phone]   Fax: [phone]

## 2011-01-18 NOTE — Letter (Signed)
Summary: RECORDS FROM DR [facility] FROM DR SASSER   Imported By: Rexene Alberts 09/03/2010 10:44:29  _____________________________________________________________________  External Attachment:    Type:   Image     Comment:   External Document

## 2011-01-18 NOTE — Letter (Signed)
Summary: External Correspondence/ FAXED [facility]  External Correspondence/ FAXED [facility]   Imported By: Dorise Hiss 01/05/2010 12:02:33  _____________________________________________________________________  External Attachment:    Type:   Image     Comment:   External Document

## 2011-01-18 NOTE — Miscellaneous (Signed)
Summary: Rehab Report/ [facility]  Rehab Report/ [facility]   Imported By: Dorise Hiss 12/28/2009 15:01:23  _____________________________________________________________________  External Attachment:    Type:   Image     Comment:   External Document

## 2011-01-20 NOTE — Assessment & Plan Note (Signed)
Summary: f22m  --agh  Medications Added COLACE 100 MG CAPS (DOCUSATE SODIUM) take one by mouth daily TOPIRAMATE 25 MG TABS (TOPIRAMATE) Take 2 tablet by mouth two times a day CRANBERRY JUICE EXTRACT 1000 MG CAPS (CRANBERRY) Take 2 tablet by mouth once a day PREDNISONE 20 MG TABS (PREDNISONE) Take 1 tablet by mouth once a day      Allergies Added:   Visit Type:  Follow-up Primary Kaizlee Carlino:  sasser   History of Present Illness: the patient is a 77 year old female with a prior history of neurocardiogenic syncope, normal left ventricular function mild mitral regurgitation and moderate pulmonary hypertension. The patient's dizziness has improved. She has been maintained on Florinef. There was some question whether she had benign positional vertigo but this appeared not to be the case. She's also been followed with CT and chest for a previous lung nodule. This is felt to be stable now.  Patient has no history of significant structural heart disease. She denies any chest pain shortness of breath orthopnea PND. She does have some complaints of generalized weakness.X.  The patient also complains of neck and back pain and is followed by primary care physician for this problem    Preventive Screening-Counseling & Management  Alcohol-Tobacco     Smoking Status: never  Current Medications (verified): 1)  Colace 100 Mg Caps (Docusate Sodium) .... Take One By Mouth Daily 2)  Levothyroxine Sodium 50 Mcg Tabs (Levothyroxine Sodium) .... Take 1 Tablet By Mouth Once A Day 3)  [facility] Spectravite Senior  Tabs (Multiple Vitamins-Minerals) .... Take 1 Tablet By Mouth Once A Day 4)  Calcium Carbonate 600 Mg Tabs (Calcium Carbonate) .... One By Mouth Daily 5)  Triple Flex 500-400-125 Mg Tabs (Glucosamine-Chondroitin-Msm) .... Take 1 Tablet By Mouth Once A Day 6)  Folic Acid 800 Mcg Tabs (Folic Acid) .... Take 1 Tablet By Mouth Once A Day 7)  Dexilant 60 Mg Cpdr (Dexlansoprazole) .... One By Mouth Each  Evening 8)  Topiramate 25 Mg Tabs (Topiramate) .... Take 2 Tablet By Mouth Two Times A Day 9)  Cranberry Juice Extract 1000 Mg Caps (Cranberry) .... Take 2 Tablet By Mouth Once A Day 10)  Flonase 50 Mcg/act Susp (Fluticasone Propionate) .... As Needed 11)  Align  Caps (Probiotic Product) .... Once Daily 12)  Lidocaine-Hydrocortisone Ace 3-0.5 % Crea (Lidocaine-Hydrocortisone Ace) .... As Needed 13)  Singulair 10 Mg Tabs (Montelukast Sodium) .... Take 1 Tablet By Mouth Once A Day 14)  Vitamin C 500 Mg Tabs (Ascorbic Acid) .... Take 1 Tablet By Mouth Once A Day 15)  Super B Complex  Tabs (B Complex-C) .... Take 1 Tablet By Mouth Once A Day 16)  Loratadine 10 Mg Tabs (Loratadine) .... Once Daily 17)  Fludrocortisone Acetate 0.1 Mg Tabs (Fludrocortisone Acetate) .... Take One Tab On Monday, Wednesday, & Friday 18)  Vesicare 10 Mg Tabs (Solifenacin Succinate) .... Once Daily 19)  Prednisone 20 Mg Tabs (Prednisone) .... Take 1 Tablet By Mouth Once A Day  Allergies (verified): 1)  ! Indocin 2)  ! Cephalexin 3)  ! Morphine 4)  ! Penicillin 5)  ! Codeine 6)  ! Levaquin 7)  ! * Ivp Dye 8)  ! Levaquin 9)  ! Bactrim 10)  * Contrast Dye  Comments:  Nurse/Medical Assistant: The patient's medication list and allergies were reviewed with the patient and were updated in the Medication and Allergy Lists.  Past History:  Past Medical History: Last updated: 10/15/2009 SYNCOPE AND COLLAPSE (ICD-780.2) CONTRAST DYE  ALLERGY (ICD-V15.08) DIVERTICULOSIS OF COLON (ICD-562.10) PRURITUS ANI (ICD-698.0) IRRITABLE BOWEL SYNDROME (ICD-564.1) GERD (ICD-530.81) Peripheral polyneuropathy Migraine headaches  1. Syncope with recurrence.   2. Positive tilt-table test on Florinef.   3. Peripheral polyneuropathy confirmed by EMG and nerve conduction       velocities.   4. Migraine headaches.   5. Ruled out for pulmonary embolism in 2007.   6. No evidence of structural heart disease.   Past Surgical  History: Last updated: 09/23/2009 wrist foot Abdominal Hysterectomy-Total Appendectomy Back Surgery  Family History: Last updated: 29-Aug-2010 Father: deceased Mother: deceased Siblings: 6 brothers- 1 living, 5 sisters- 2 living No FH of Colon Cancer:  Social History: Last updated: 09/23/2009 Retired  Widowed  Tobacco Use - No.  Alcohol Use - no Regular Exercise - yes Drug Use - no  Risk Factors: Exercise: yes (09/23/2009)  Risk Factors: Smoking Status: never (12/15/2010)  Review of Systems       The patient complains of fatigue.  The patient denies malaise, fever, weight gain/loss, vision loss, decreased hearing, hoarseness, chest pain, palpitations, shortness of breath, prolonged cough, wheezing, sleep apnea, coughing up blood, abdominal pain, blood in stool, nausea, vomiting, diarrhea, heartburn, incontinence, blood in urine, muscle weakness, joint pain, leg swelling, rash, skin lesions, headache, fainting, dizziness, depression, anxiety, enlarged lymph nodes, easy bruising or bleeding, and environmental allergies.    Vital Signs:  Patient profile:   77 year old female Height:      70 inches Weight:      206 pounds Pulse rate:   66 / minute BP sitting:   113 / 79  (left arm) Cuff size:   large  Vitals Entered By: Carlye Grippe (December 15, 2010 10:10 AM)  Physical Exam  Additional Exam:  GEN:77 year old female, sitting upright, in no distress HEENT: NCAT,PERRLA,EOMI NECK: palpable pulses, no bruits; no JVD; no TM LUNGS: CTA bilaterally HEART: RRR (S1S2); no significant murmurs; no rubs; no gallops ABD: soft, NT; intact BS EXT: intact distal pulses; no edema SKIN: warm, dry MUSC: no obvious deformity NEURO: A/O (x3)     Impression & Recommendations:  Problem # 1:  VALVULAR HEART DISEASE (ICD-424.90) the patient has history of mild mitral regurgitation. She does however have pulmonary hypertension and therefore an echo will be scheduled in 6  months. Orders: EKG w/ Interpretation (93000)  Problem # 2:  PULMONARY NODULE (ICD-518.89) stable  Problem # 3:  SYNCOPE AND COLLAPSE (ICD-780.2) postictal table test and neurocardiogenic syncope improved with Florinef Her updated medication list for this problem includes:    Prednisone 20 Mg Tabs (Prednisone) .Marland Kitchen... Take 1 tablet by mouth once a day  Patient Instructions: 1)  Echo in 6 months prior to next visit 2)  Follow up in  6 months

## 2011-03-01 ENCOUNTER — Encounter: Payer: Self-pay | Admitting: Urgent Care

## 2011-03-08 NOTE — Medication Information (Signed)
Summary: [facility]  [facility]   Imported By: Rexene Alberts 03/01/2011 14:44:48  _____________________________________________________________________  External Attachment:    Type:   Image     Comment:   External Document

## 2011-05-03 NOTE — Assessment & Plan Note (Signed)
[facility]                          [facility] OFFICE NOTE   KARIA, EHRESMAN                    MRN:          [id number]  DATE:02/20/2008                            DOB:          16-Sep-1930    REASON FOR CONSULTATION:  Syncope.   HISTORY OF THE PRESENT ILLNESS:  The patient is a delightful 77 year old  female with a history of atypical chest pain, previously referred to [place]  here at [facility] in December 2007.  At that time, she was  evaluated with an adenosine stress Cardiolite for atypical chest pain,  and this was negative for any definite evidence of ischemia.  A 2-D  echocardiogram was also done, revealing mild LVH with normal LVEF (55 -  60%) and no wall motion abnormalities or significant valvular heart  disease.  There was some suggestion of mild pulmonary hypertension,  however.  The patient is now referred to Dr. Andee Lineman for evaluation of  recent episodes of recurrent syncope.   The patient describes several discrete episodes over the recent past few  weeks, during which she feels that she momentarily passes out.  However, she is aware when she hits the floor or ground.  Her initial  episode was on October 27, 2008, which resulted in a laceration of the  chin, requiring four stitches in the emergency room, as well as fracture  of the left wrist.  She apparently did not require hospitalization.  Since then, she has had several episodes, some of which have been  witnessed.  There are never any prodromal symptoms of chest pain, tachy  palpitations, diaphoresis or nausea.  There is also no sensation of  vertigo.  Also of note, these symptoms are not strictly correlated to  postural changes.  In fact, her first episode occurred as she was about  to get out of her car, during which she fell directly onto the sidewalk.  She had not yet even stood up.  The patient denies any prior or current  history of tachy palpitations.  She  also denies any exertional chest  discomfort.  She does report some exertional dyspnea, however.   Electrocardiogram today reveals normal sinus rhythm at 69 beats per  minute with normal axis and nonspecific ST abnormalities.   ALLERGIES:  PENICILLIN.   CURRENT MEDICATIONS:  1. Gabapentin 300 twice a day.  2. Prevacid 30 twice a day.  3. Citalopram 30 daily.  4. Celebrex 200 twice a day.  5. Docusate 100 twice a day.  6. Levothyroxine 75 mg daily.  7. Doxycycline 100 twice a day.  8. Glucosamine.   PAST MEDICAL HISTORY:  1. Atypical chest pain:      a.     Low-risk adenosine stress Cardiolite, EF 64%, December 2007.  2. Mild pulmonary hypertension:      a.     By 2-D echo, December 2007.  3. GERD.  4. Treated hypothyroidism.  5. Peripheral neuropathy.  6. Osteoarthritis.  7. Chronic sinusitis.  8. Nonobstructive cerebrovascular disease by carotid Dopplers in 2005.   PAST SURGICAL HISTORY:  1. Appendectomy.  2. Lumbar laminectomy.  3. Bilateral foot surgery.  4. Bilateral carpal tunnel release.  5. TAH/BSO.   REVIEW OF SYSTEMS:  Patient has noted some recent occasional sharp left  temporal headaches.  She has difficulty in standing after falling to the  floor.  She has been having to use a cane for assistance in ambulation.  She also is on her third round of antibiotics for treatment of recent  bronchitis.  She denies any history of hypertension or diabetes  mellitus.  Otherwise as noted per the HPI.  The remaining systems are  negative.   SOCIAL HISTORY:  The patient is widowed, has four grown children and  lives alone.  She has never smoked tobacco or used alcohol.   FAMILY HISTORY:  The patient's father is deceased at age 54 due to  complications of stroke.  Mother died at age 48.   PHYSICAL EXAMINATION:  VITAL SIGNS:  Blood pressure 117/70, and pulse 64  and regular.  Weight 217 pounds.  GENERAL APPEARANCE:  In general this is a 77 year old female sitting   upright and in no distress.  HEENT:  Normocephalic and atraumatic.  NECK:  The neck has palpable bilateral carotid pulses without bruits; no  JVD at 90 degrees.  LUNGS:  The lungs are clear to auscultation in all fields.  HEART:  The heart has a regular rate and rhythm, S1 and S2.  There is a  soft grade 1/6 short systolic ejection murmur at the base.  No rubs.  ABDOMEN:  The abdomen is soft and nontender with intact bowel sounds.  No pulsatile the epigastric mass.  EXTREMITIES:  The extremities have minimally palpable dorsalis pedis  pulses with no significant pedal edema.  NEUROLOGIC EXAMINATION:  Neurologically she is alert and oriented.   IMPRESSION:  1. Recurrent syncope:      a.     Rule out dysrhythmia or sick sinus syndrome.      b.     Rule out dysautonomia.  2. History of preserved left ventricular function.   PLAN:  1. Two-dimensional echocardiography for reassessment of left      ventricular function and rule out of underlying structural      abnormalities.  2. CardioNet monitoring to rule out dysrhythmia.  3. The patient is to refrain from driving, pending completion of her      evaluation.  4. Check orthostatic blood pressures to rule out postural hypotension.  5. Computerized tomographic scan of the head, with contrast, to rule      out primary neurogenic etiology.  6. Erythrocyte sedimentation rate and CBC/differential.  If the ESR is      elevated, then this may suggest that the patient has polymyalgia      rheumatica as a possible explanation for her generalized limb      weakness, as well as recent headache.  If, however, this is      negative, then we would consider repeat nerve conduction studies.      The patient reports having had these done in the remote past.  7. Schedule early clinic follow up for review of these study results      and further recommendations.      Gene Serpe, PA-C  Electronically Signed      Learta Codding, MD,FACC   Electronically Signed   GS/MedQ  DD: 02/20/2008  DT: 02/21/2008  Job #: [id number]   cc:   Fara Chute

## 2011-05-03 NOTE — Discharge Summary (Signed)
[name], [name]             ACCOUNT NO.:  [id number]   MEDICAL RECORD NO.:  [id number]          PATIENT TYPE:  OIB   LOCATION:  2899                         FACILITY:  [facility]   PHYSICIAN:  Doylene Canning. Ladona Ridgel, MD    DATE OF BIRTH:  July 17, 1930   DATE OF ADMISSION:  05/15/2008  DATE OF DISCHARGE:                               DISCHARGE SUMMARY   This patient has several allergies and they are to MORPHINE, PENICILLIN,  LEVAQUIN, DOXYCYCLINE, INDOCIN, and CODEINE.   FINAL DIAGNOSES:  1. Tilt study on May 15, 2008, no inducible syncope.      a.     Carotid sinus massage administered and a negative study       there.      b.     Overall blood pressure drop of 60 mm of mercury with       questionable clinical significance.  2. History of recurrent syncope.      a.     Echocardiogram on February 27, 2008, ejection fraction of 55%       to 60%.  No left ventricular wall motion abnormalities.  All       valves are normal.  Moderate pulmonary hypertension.      b.     Carotid duplex ultrasound on May 05, 2008, a tortuous       redundant proximal right common carotid artery with 1-2, 360-       degree loops.  She has a serpentine proximal left common carotid       artery.  The internal carotid arteries 0% to 39% stenosis with no       plaque.      c.     CardioNet monitor, no arrhythmias noted during the study,       even though the patient had clinical occurrences of presyncope.      d.     Cardiolite study on December 2007, negative for ischemia.      e.     The patient has history of headaches.  Erythrocyte       sedimentation rate was negative.  The patient rules out for       temporal arteritis/polymyalgia rheumatica.   SECONDARY DIAGNOSES:  1. Peripheral neuropathy.  2. History of migraine headaches.  3. Remote pulmonary embolism.  4. Status post cholecystectomy, appendectomy, lateral foot surgery,      and hysterectomy.   PROCEDURE:  May 15, 2008, head-up tilt study, which was  negative for  inducible syncope, Dr. [name].  The patient referred back to Dr.  [name].   BRIEF HISTORY:  Ms. [name] is a 77 year old female.  She has history of  recurrent syncope.  She had episodes of syncope when she was wearing a  monitor and there were no obvious arrhythmias.   She notes these episodes have been present for over a year, the first  one occurred back in December 2008.  The patient had an upper  respiratory infection.  There have been occasional episodes going back 5  years, however.   Since January of this year 2009.  She has had several episodes of frank  syncope.  Usually, these occur when she is standing.  She often falls,  but she had two episodes while she was sitting.  They are sudden and  onset.  They have lasted only a few seconds.  She has no prodrome or no  warning.  There was no loss of bowel or bladder tone.  She feels weak  and tired after the episodes.  They are occasionally related to  headache.   This patient has unexplained syncope.  The record was not completely  clear as to what was happening to this patient.  Recommendation was made  for a tilt study.  She will appear at [facility] electively.   HOSPITAL COURSE:  The patient presents electively on May 15, 2008.  She  underwent head-up tilt study.  She had a carotid massage and there was  no inducible syncope.  There was however a 60 mm drop in blood pressure.  The patient was without symptoms with this, however, and this has a  questionable clinical significance.   The patient discharging home on her pre-procedure medications, which  include,  1. Gabapentin 300 mg twice daily.  2. Prevacid 30 mg twice daily.  3. Citalopram 30 mg daily.  4. Celebrex 200 mg twice daily.  5. Docusate 100 mg twice daily.  6. Levothyroxine 75 mcg daily.  7. Doxycycline 100 mg twice daily.  8. Glucosamine.  9. The patient also takes Culturelle daily.   We will check to see if she has to  follow with Dr. Andee Lineman and put this  on her discharge summary as well.   LABORATORY STUDIES:  None pertinent to this admission.      Maple Mirza, PA      Doylene Canning. Ladona Ridgel, MD  Electronically Signed    GM/MEDQ  D:  05/15/2008  T:  05/16/2008  Job:  [id number]   cc:   Learta Codding, MD,FACC

## 2011-05-03 NOTE — Assessment & Plan Note (Signed)
[facility]                          [facility] OFFICE NOTE   [name], [name]                    MRN:          [id number]  DATE:03/02/2009                            DOB:          Oct 09, 1930    REFERRING PHYSICIAN:  Dr. Fara Chute   HISTORY OF PRESENT ILLNESS:  The patient is a 79-year female with  history of syncope with the last episode in 2009.  The patient has  reportedly a known polyneuropathy, but also a seizure disorder and  followed by Dr. Pearlean Brownie.  She has requested EMGs and nerve conduction  velocities, but have not obtained these results.  The patient states  that she still has episodes of staggering wide-based gait.  She also  reports dizziness, she has been seen by ENT.  She was told that there  possibly could be a problem in her inner ear.  She also had a tilt-table  testing done which was positive and recommendations were given to  increase salt and water intake.  We added Florinef into the patient's  medical regimen which has now caused some edema in the lower  extremities.  However, she has had no further syncope.   MEDICATIONS:  1. Phenytoin 100 mg 2 tablets at bedtime.  2. Levothyroxine 50 mcg p.o. daily.  3. Kapidex 60 mg p.o. b.i.d.  4. Enablex 50 mg p.o. daily.  5. Fludrocortisone 0.1 mg p.o. daily.  6. Benefiber 2 tablets p.o. daily.  7. Celebrex 200 mg p.o. b.i.d.  8. Docusate 100 mg p.o. daily.  9. Topamax 25 mg 2 tablets p.o. b.i.d.  10.Calcium D.  11.Vitamin C.  12.Vitamin B.  13.Glucosamine.  14.Folic acid.  15.Multivitamin.  16.Vitamin E.   PHYSICAL EXAMINATION:  VITAL SIGNS:  Blood pressure 133/81, heart rate  64, weight 216.  NECK:  Normal carotid upstrokes.  No carotid bruits.  LUNGS:  Clear breath sounds bilaterally.  HEART:  Regular rate and rhythm.  Normal S1 and S2.  No murmurs, rubs,  or gallops.  ABDOMEN:  Soft, nontender.  No rebound or guarding.  Good bowel sounds.  EXTREMITIES:  No  cyanosis or clubbing.  There is 1+ ankle edema.  NEURO:  The patient is alert, oriented, and grossly nonfocal.   PROBLEM LIST:  1. Syncope with recurrence.  2. Positive tilt-table test on Florinef.  3. Peripheral polyneuropathy confirmed by EMG and nerve conduction      velocities.  4. Migraine headaches.  5. Ruled out for pulmonary embolism in 2007.  6. No evidence of structural heart disease.   PLAN:  1. The patient does have some peripheral edema likely from Florinef      and we will cut back to 0.1 mg Monday, Wednesday, Friday.  2. No further cardiac workup, however, is required.  I suggested the      patient follow up with her ENT doctor, as it likely that there is      an ENT cause to her dizziness.     Learta Codding, MD,FACC  Electronically Signed    GED/MedQ  DD: 03/02/2009  DT: 03/03/2009  Job #:  [id number]   cc:   Fara Chute

## 2011-05-03 NOTE — Assessment & Plan Note (Signed)
[facility]                         ELECTROPHYSIOLOGY OFFICE NOTE   HALYNN, REITANO                    MRN:          [id number]  DATE:05/05/2008                            DOB:          1930/07/21    Ms. [name] is referred today by Dr. Andee Lineman for consideration and  evaluation of recurrent syncope.  The patient is a very pleasant elderly  woman with a history of recurrent episodes of syncope.  She apparently  was wearing a monitor when she had one of these, and there was no  obvious arrhythmias, although it is unclear that she actually passed  out.  She notes that these episodes been present now for over a year  with first one occurring after a URI back in December of 2008.  In  retrospect, she does note that there may have been occasional episodes  over 5 years ago.  Since January of this year, she has had several  episodes of frank syncope.  Usually, these are when she is standing.  She will often fall.  She has had actually two episodes occurring while  she is sitting.  There is a sudden onset.  They typically last only for  a few seconds.  There is no warning.  She has had no loss of bowel or  bladder tone.  She does feel weak and tired after the episodes.  They  are occasionally related to headache.   Her past medical history is notable for peripheral neuropathy.  She has  a history of migraine headaches.  She has a remote history of pulmonary  embolism.  She is status post cholecystectomy.  She does not have known  structural heart disease.   FAMILY HISTORY:  Is noncontributory with her advanced age.   MEDICATIONS:  Include:  1. Gabapentin 300 twice daily.  2. Prevacid 30 a day.  3. Celebrex 200 twice a day.  4. Colace 100 twice daily.  5. Synthroid 75 mcg daily.  6. Doxycycline and glucosamine.   PAST SURGICAL HISTORY:  Is notable for appendectomy and laminectomy and  bilateral foot surgery.  She is status post hysterectomy.   SOCIAL HISTORY:  The patient is widowed.  She has four grown children.  She denies tobacco or ethanol use.   REVIEW OF SYSTEMS:  Is notable for occasional sharp headache.  She walks  with a cane.  She has had multiple treatments for bronchitis with  productive cough largely resolved.  Otherwise all systems reviewed and  negative except as noted in HPI.   PHYSICAL EXAMINATION:  The patient is a pleasant well-appearing elderly  woman in no acute distress.  The patient's only significant finding by  echocardiogram was fairly moderate pulmonary hypertension.  The rest of  her physical exam is notable for a blood pressure of 130/92, the pulse  was 76 and regular, respirations were 18.  Weight was 221 pounds.  HEENT:  Normocephalic and atraumatic.  Pupils equal and round.  Oropharynx moist.  Sclerae anicteric.  NECK:  Revealed 7-cm jugular venous distention.  There was no  thyromegaly.  Trachea was midline.  Carotid 2+ and  symmetric.  LUNGS:  Clear bilaterally to auscultation.  No wheezes, rales or rhonchi  were present.  There is no increased work of breathing.  CARDIOVASCULAR EXAM:  Revealed a regular rhythm with normal S1 and S2.  There are no murmurs, rubs or gallops that I could appreciate.  I did  not appreciate a right-sided S4 today.  PMI was not enlarged nor was it  laterally displaced.  ABDOMINAL EXAM:  Was soft and nontender.  There was no organomegaly.  Bowel sounds were present.  There was no rebound or guarding.  EXTREMITIES:  Demonstrated no cyanosis, clubbing or edema.  Pulses 2+  and symmetric.  NEUROLOGIC:  Alert and oriented x3.  Cranial nerves intact.  Strength  was 5/5 and symmetric.   Prior EKG demonstrates sinus rhythm.  Prior echocardiograms as  previously noted.   IMPRESSION:  Unexplained syncope.  The record is not completely clear,  and I need to confirm with Dr. Andee Lineman.  It appears though that she has  worn a monitor and had syncope when she did not have  bradycardia, though  it is not completely clear.  I have recommended that she proceed with  head-up tilt table testing.  Part of her syncopal prodrome sounds like  she is having neurally mediated phenomena, part of it sounds like she  may having bradycardia as a cause.  Hopefully, a tilt table will help [place]  short this out.  She may ultimately require implantable loop recorder  insertion as well.     Doylene Canning. Ladona Ridgel, MD  Electronically Signed    GWT/MedQ  DD: 05/05/2008  DT: 05/05/2008  Job #: [id number]   cc:   Learta Codding, MD,FACC

## 2011-05-03 NOTE — Assessment & Plan Note (Signed)
[facility]                          [facility] NOTE   [name], [name]                    MRN:          [id number]  DATE:03/31/2008                            DOB:          [birth year]    REFERRING PHYSICIAN:  Fara Chute   HISTORY OF PRESENT ILLNESS:  The patient is a pleasant 77 year old  female with history of recurrent syncope which has been longstanding.  She was seen in the office by Rozell Searing on February 20, 2008.  She  underwent cardiovascular testing with a echocardiogram which showed  essentially normal LV function with mild to moderate pulmonary  hypertension.  The patient had a prior CT scan in 2007 which was  negative for pulmonary embolism and chronic thromboembolic disease.  She  also underwent a CardioNet monitor, and, despite several episodes of  lightheadedness, dizziness and presyncope, no significant arrhythmias  were noted.  The patient also in the past has had a negative ischemia  workup done.  Cardiolite is dated December 18, 2006.  Please see the  dictated note from Gene Serpe regarding description the patient's  syncope. Of importance, however, is that the patient states that over  the last 2 weeks her symptoms have significantly improved.  It is also  of note that she states that usually she develops a shooting pain in the  left temporal area which then radiates over the remainder of the cranium  anteriorly and occipitally.  That is followed by a dull headache, and  briefly thereafter she will pass out.  Of note, the patient had a sed  rate done to rule out temporal arthritis, and this was negative.  The  patient has also a significant amount of stress.  She has a known  peripheral polyneuropathy in upper and lower extremities.  Of note,  also noticed a pulsatile mass at the base of the patient's right neck  related to her carotid artery.   MEDICATIONS:  Are listed in the chart.   PHYSICAL EXAMINATION:   VITAL SIGNS:  Blood pressure 126/80, heart rate  70, and weight 229 pounds.  NECK:  Normal carotid strokes, no carotid bruits.  There is a pulsatile  mass at the base of the right neck related to the patient's carotid  artery.  There are no carotid bruits, however.  LUNGS:  Clear breath sounds bilaterally.  HEART:  Regular rate and rhythm.  Normal S1 andS2.  No murmurs, rubs or  gallops.  ABDOMEN:  Soft, nontender.  No rebound or guarding.  Good bowel sounds.  EXTREMITIES:  No cyanosis, clubbing or edema.  NEUROLOGIC:  Patient is alert, oriented and grossly nonfocal.   PROBLEM LIST:  1. Recurrent syncope.      a.     Rule out vasodepressor syncope (no evidence of       cardioinhibitor response with negative CardioNet monitoring).      b.     Rule out migraine accompene'.      c.     Rule out Pott syndrome, unlikely.  2. Peripheral polyneuropathy.  3. Migraine headaches.  4. Rule out for pulmonary  embolism in 2007.  5. Negative ischemia workup and no definite structural heart disease.   PLAN:  1. The patient's syncope remains puzzling . It does not appear to be      related to cardioinhibitor response.  There is no evidence of      bradycardia on CardioNet monitor. However, there could still be a      vasodepressor response associated, and I suggest the patient may      require further EP evaluation with tilt table testing.  2. The patient also will be referred for neurology evaluation.  3. She already had a CT scan of the head which was unrevealing, but      she may need ENG and nerve conduction velocity testing done.  4. The patient will follow up with EP and neurology, the former for      possible tilt table testing.  5. The patient will also get carotid Doppler study done in our      [place] office to evaluate her pulsatile right carotid mass      which could represent a common carotid artery aneurysm.     Diana Codding, MD,FACC  Electronically Signed    GED/MedQ   DD: 03/31/2008  DT: 03/31/2008  Job #: [id number]   cc:   Fara Chute

## 2011-05-03 NOTE — Assessment & Plan Note (Signed)
[facility]                          [facility] OFFICE NOTE   ONIE, KASPAREK                    MRN:          [id number]  DATE:01/07/2009                            DOB:          February 14, 1930    REFERRING PHYSICIAN:  Fara Chute   HISTORY OF PRESENT ILLNESS:  The patient is a 77 year old female with a  history of recurrent syncope, although her last syncopal episode was in  November 2009.  The patient has a known polyneuropathy and also seizure  disorder is followed by Dr. Pearlean Brownie.  The entire etiology of her  neuropathic syndrome, however, has not been determined yet.  She still  has episodes of a staggering and wide-based gait.  She also has frequent  episodes of dizziness.  She did undergo tilt table testing after I  referred the patient to Dr. Ladona Ridgel and she reportedly tested positive,  and recommendations were given to increase her salt and water intake.  This is rather difficult for the patient to do enough given her  prescription of Florinef 0.1 mg p.o. daily today to see if this might  help her symptomatology.  From a cardiac perspective, however, the  patient is stable.   MEDICATIONS:  1. Gabapentin 300 mg p.o. b.i.d.  2. Prevacid.  3. Citalopram.  4. Celebrex.  5. Docusate sodium.  6. Levothyroxine.  7. B complex.  8. Folic acid.  9. Calcium.  10.Spectravite.  11.Glucosamine.  12.Topamax.  13.Levothyroxine.  14.Propoxyphene.  15.Meclizine p.r.n.   PHYSICAL EXAMINATION:  VITAL SIGNS:  Blood pressure is 120/78 and heart  rate is 62.  GENERAL:  A well-nourished white female in no apparent distress.  HEENT:  Pupils, eyes are clear. Conjunctivae are clear.  NECK:  Supple.  Normal carotid upstroke and no carotid bruits.  LUNGS:  Clear breath sounds bilaterally.  HEART:  Regular rate and rhythm.  Normal S1 and S2.  No murmur, rubs, or  gallops.  ABDOMEN:  Soft and nontender.  No rebound or guarding.  Good bowel  sounds.  EXTREMITIES:  No cyanosis, clubbing, or edema.   PROBLEM LIST:  1. Recurrent syncope.      a.     Vasodepressor syncope, positive tilt table test.      b.     Seizure disorder.      c.     Peripheral polyneuropathy confirmed by electromyelography       and nerve conduction velocity studies (I do not have those results       available per Dr. Pearlean Brownie).  2. Rule out migraine headaches.  3. Rule out for pulmonary embolism in 2007, negative ischemia workup      and no definite structural heart disease.   PLAN:  1. Please see my note of March 31, 2008, when I set up the patient for      all the referrals including Neurology and if he unfortunately had      gotten no results back from any of the consultants then we will      request all the study results.  2. Given the fact that she has  unexplained polyneuropathy, which      appears to be progressive, I am planning to consider diagnosis of      amyloidosis and I have ordered SPEP, UPEP, immunofixation, and      serum free light chain assay.  3. I will have the patient come back in 1 month after I start her on      Florinef and reassess the situation, and spent some extra time in      the office and see if we can find a unifying diagnosis for her      problems.     Learta Codding, MD,FACC  Electronically Signed    GED/MedQ  DD: 01/07/2009  DT: 01/07/2009  Job #: [id number]   cc:   Fara Chute

## 2011-05-03 NOTE — Assessment & Plan Note (Signed)
NAMEDEVI, HOPMAN              CHART#:  [id number]   DATE:  06/04/2008                       DOB:  [birth year]   Followup gastroesophageal reflux disease and history of left-sided  diverticulosis from 2003 colonoscopy.   The patient returns, she is having some occasional breakthrough symptoms  on Prevacid 30 mg orally b.i.d., although unfortunately she has gained 8  pounds since her last visit and is significantly over her ideal body  weight.  Unfortunately, she has been blacking out.  She had a negative  cardiac workup under direction of Dr Andee Lineman.  She is going to see the  neurologist in the near future.  In one of her syncopal episode, she  suffered a left wrist fracture and that has slowed her down  considerably.  She has not been as much active as she has been  previously because of her fracture.  She is taking a probiotic way of  Culturette once daily.  She is having fairly regular bowel movements.  She has a history of diverticulosis.   Her concern is reflux symptoms have flared up at night and early  morning.  She has retrosternal burning and burning throat and nausea in  the morning, which she attributes to gastroesophageal reflux disease.   CURRENT MEDICATIONS:  See updated list.   ALLERGIES:  INDOCIN, CEPHALEXIN, MORPHINE, PENICILLIN, CODEINE,  LEVAQUIN, and IVP DYE.   PHYSICAL EXAMINATION:  GENERAL:  Today, she appears at baseline.  VITAL SIGNS:  Weight 220, height 5 feet 10, temperature 98.5, BP 110/76,  and pulse 64.  SKIN:  Warm and dry.  CHEST:  Lungs are clear to auscultation.  CARDIAC:  Regular rate and rhythm without murmur, gallop, or rub.  ABDOMEN:  Nondistended, obese, positive bowel sounds, soft, nontender  without appreciable mass or splenomegaly.   ASSESSMENT:  Gastroesophageal reflux disease symptoms, somewhat  refracted to b.i.d. Prevacid.  This was in the setting of progressive  weight gain.  I pointed out  that progressive weight gain  will  undermine our efforts treating gastroesophageal reflux disease.   Constipation much better on probiotic therapy.   RECOMMENDATIONS:  1. Continue probiotic way of Culturette daily.  2. We will switch her out to Kapidex 60 mg b.i.d. for the next 2 weeks      to see if this works better for her.  She is to let us know.  I      have provided her with samples.  3. I have encouraged her to try to accomplish weight loss at 20 pounds      in the next year.  Unless something comes up, plan to see this nice      lady back in 1 year.       Jonathon Bellows, M.D.  Electronically Signed     RMR/MEDQ  D:  06/04/2008  T:  06/05/2008  Job:  [id number]   cc:   Fara Chute

## 2011-05-03 NOTE — Op Note (Signed)
[name], [name]             ACCOUNT NO.:  [id number]   MEDICAL RECORD NO.:  [id number]          PATIENT TYPE:  OIB   LOCATION:  2899                         FACILITY:  [facility]   PHYSICIAN:  Doylene Canning. Ladona Ridgel, MD    DATE OF BIRTH:  06-21-1930   DATE OF PROCEDURE:  05/15/2008  DATE OF DISCHARGE:                               OPERATIVE REPORT   PROCEDURE PERFORMED:  Head-up tilt-table testing with carotid sinus  massage.   INTRODUCTION:  The patient is a very pleasant elderly woman with a  history of recurrent syncope.  She has worn a monitor and no obvious  arrhythmias demonstrated while she was wearing the monitor.  The patient  is now because of her unexplained syncope is referred for head-up tilt-  table testing.   PROCEDURE:  After informed consent was obtained, the patient was taken  to the Diagnostic EP Lab in a fasting state.  After usual preparation,  she was placed in the supine position.  Her initial blood pressure was  in the 10s with a heart rate in the 70 beat per minute range.  She was  tilted in a 70-degree position for a total of 45 minutes.  Her blood  pressure initially decreased from the mid 150s to the mid 130s.  Her  heart rate did not change.  Over the next 45 minutes, she was observed  and her blood pressure gradually drifted down.  After approximately 30  minutes of tilting, the blood pressure reached its low of 85/58, heart  rate remained in the 60-70 range.  A carotid massage was carried out  both in the right and left carotid arteries sequentially and this  demonstrated no bradycardia.  The patient remained with blood pressures  in the low 100 to the high at 90s over the 45 minutes of tilting and  then was placed back in the supine position and was returned to her room  in satisfactory condition.   COMPLICATIONS:  There were no immediate procedure complications.   RESULTS:  Demonstrates a head-up tilt-table test which was negative for  inducible  syncope but was positive for demonstration of a drop of blood  pressure of over 50 points from baseline supine position to the head-up  tilt-table position.  The clinical significance of this blood pressure  drop is unclear though it does suggest that she at least had some venous  insufficiency and venous pooling.      Doylene Canning. Ladona Ridgel, MD  Electronically Signed     GWT/MEDQ  D:  05/15/2008  T:  05/16/2008  Job:  [id number]   cc:   Learta Codding, MD,FACC

## 2011-05-06 NOTE — Op Note (Signed)
[facility]  Patient:    [name], [name] Visit Number: [id number] MRN: [id number]          Service Type: END Location: DAY Attending Physician:  Jonathon Bellows Dictated by:   Roetta Sessions, M.D. Proc. Date: 01/02/02 Admit Date:  01/02/2002   CC:         Fara Chute, M.D., [facility], [place], [place].   Operative Report  PROCEDURE:  EGD with biopsy followed by screening colonoscopy.  INDICATIONS FOR PROCEDURE:  The patient is a 77 year old lady with long-standing gastroesophageal reflux symptoms.  EGD is now being done to further evaluate her long-standing symptoms and to evaluate her for complications.  She is also undergoing screening colonoscopy. This approach has been discussed with the patient previously in my office.  The potential risks, benefits, alternatives had been reviewed and questions answered, and she is agreeable.  Please see my December 04, 2001, office note for more information.  ANESTHESIA: Conscious sedation.  PROCEDURE NOTE:  O2 saturation, blood pressure, pulse rate, and respirations were monitored throughout the entirity of both procedures.  Conscious sedation for both procedures, Versed 3 mg in divided doses, Demerol 50 mg IV in divided doses, and Cetacaine spray for topical oropharyngeal anesthesia.  INSTRUMENTS:  Olympus video gastroscope and colonoscope.  EGD FINDINGS:  Examination of ______ esophagus revealed normal mucosa and no evidence of Barretts esophagus.  There was a patulous EG junction and stomach.  The gastric cavity was emptied and insufflated with air.  Thorough examination of the gastric mucosa including retroflexion of the proximal stomach and esophagogastric junction demonstrated only multiple small, 2-3 mm polyps.  The pylorus was patent and easily traversed. The descending duodenum, bulb, and second portion appeared normal.  ______ diagnostic maneuver was performed.  One of the polyps in  the fundus was biopsied.  The patient tolerated the procedure well and was prepared for colonoscopy.  Digital rectal examination revealed no abnormalities.  ENDOSCOPIC FINDINGS:  The prep was good.  Rectum:  The examination of the rectal mucosa including retroflexed view of the anal verge revealed no internal hemorrhoids.  Colonic mucosa was ______ from the rectosigmoid junction through the left transverse, right colon, to the area of the appendiceal orifice, the ileocecal valve, and cecum.  The patient had shallow left-sided diverticula.  The remainder of the colonic mucosa appeared normal from the level of the cecum and ileocecal valve.  The scope was slowly withdrawn.  All previously mentioned mucosal surfaces were again seen.  No other abnormalities were observed.  The patient tolerated the procedure well. The cecum, ileocecal valve, and appendiceal orifice were photographed for the record.  The patient tolerated both procedures well and was reactive.  ENDOSCOPY IMPRESSION:  Patulous EG junction.  Fundal polyps of doubtful clinical significance, biopsied.  The remainder of the upper GI tract appeared normal.  COLONOSCOPY FINDINGS: 1. Internal hemorrhoids; otherwise, normal rectum. 2. Shallow left-sided diverticulum. 3. Remainder of colonic mucosa appearing normal.  RECOMMENDATIONS: 1. Diverticulosis literature provided to the patient. 2. Continue Nexium. 3. Follow up on pathology. 4. Office visit with [place] in three to four weeks to assess her progress. Dictated by:   Roetta Sessions, M.D. Attending Physician:  Jonathon Bellows DD:  01/02/02 TD:  01/02/02 Job: [id number]

## 2011-05-06 NOTE — H&P (Signed)
NAMEDAWSYN, ZURN             ACCOUNT NO.:  [id number]   MEDICAL RECORD NO.:  [id number]          PATIENT TYPE:  AMB   LOCATION:  DAY                           FACILITY:  [facility]   PHYSICIAN:  R. Roetta Sessions, M.D. DATE OF BIRTH:  [birth year]   DATE OF ADMISSION:  06/16/2005  DATE OF DISCHARGE:  LH                                HISTORY & PHYSICAL   CHIEF COMPLAINT:  Heme positive stools.   HISTORY OF PRESENT ILLNESS:  The patient is a pleasant 77 year old Caucasian  female followed in primary by Dr. Fara Chute over in [place] who was found to  be hemoccult positive on digital exam by Dr. Neita Carp recently. She was  complaining of some pruritus ani. She has not had any gross blood per rectum  or anything that sounds like melena recently. She tells me her itching has  resolved. She was having two to three bowel movements daily, but really  since taking Citrucel fairly regularly, she is having one formed bowel  movement daily. She denies having any incontinence recently. She went to a  conference in [place] over this past weekend and developed nausea and  vomiting which lasted approximately 24 hours. This has resolved. Her reflux  symptoms were well controlled on Prevacid 30 mg orally b.i.d. She underwent  a colonoscopy back on January 02, 2002 for colorectal cancer screening. She  was found to have internal hemorrhoids and some swallow left sided  diverticula. Remainder of colonic mucosa appeared normal. EGD revealed a  patulous EG junction and a fundal polyp (biopsy confirmed benignity). The  remainder of upper GI tract appeared normal. Dr. Neita Carp prescribed  Proctofoam which has been associated with essential resolution of her  pruritus ani symptoms.   PAST MEDICAL HISTORY:  1.  Hypothyroidism.  2.  Chronic osteoarthritis.   PAST SURGICAL HISTORY:  Back and bilateral wrist surgery, both feet.  Hysterectomy, D&C, appendectomy.   CURRENT MEDICATIONS:  1.  Synthroid 75 mcg  daily.  2.  Premarin 0.125 mg daily.  3.  Celebrex 200 mg 1 b.i.d.  4.  Multivitamin daily.  5.  Vitamin E supplement daily.  6.  Vitamin C.  7.  Calcium supplement daily.  8.  Glucosamine once daily.  9.  Darvocet-N 100 p.r.n.  10. Prevacid 30 mg orally b.i.d.  11. Levbid 1 b.i.d.  12. Proctofoam b.i.d.   ALLERGIES:  KEFLEX, PENICILLIN, CODEINE, MORPHINE, INDOCIN.   SOCIAL HISTORY:  There is no family history of colorectal cancer. She  recently lost a brother to a chronic illness related to a blood disorder.   REVIEW OF SYSTEMS:  No recent chest pain. No dyspnea. No fever, chills. She  has lost 5-1/2 pounds since her last visit here in an concerned effort to  become more healthy.   PHYSICAL EXAMINATION:  GENERAL:  An elderly 77 year old lady resting  comfortably in no acute distress. Weight 208, height 5 foot 10. Temperature  98.1, BP 132/72, pulse 70.  SKIN:  Warm and dry.  CHEST:  Lungs are clear to auscultation.  CARDIAC:  Regular rate and rhythm without murmur.  ABDOMEN:  Nondistended. Positive bowel sounds. Soft, nontender. Without  appreciable mass or organomegaly.  EXTREMITIES:  No edema.  RECTAL:  Deferred until the time of colonoscopy.   IMPRESSION:  The patient is a pleasant 77 year old lady with recent symptoms  consistent with pruritus ani. She was found to have Hemoccult positive on  digital rectal exam. Clinically, she has not noted any GI bleeding, and it  has been three and a half years since she last had her lower GI tract  imaged. I feel it would be in her best interest and agree with Dr. Neita Carp  that colonoscopy now would be in order. To this end, I have recommended a  colonoscopy to the patient. Potential risks, benefits, and alternatives have  been reviewed. She wonders if she should not have her upper GI tract  evaluated, since it has also been three and a half years since this was  done.   I do not feel strongly that this is an issue. She recently  had a self-  limited illness characterized by nausea and vomiting when she was on a trip.  This certainly could have been a viral or toxic/food poisoning episode. I  suspect those symptoms will indeed turn out to be self-limiting; however, we  will leave the door open should she have any interim symptoms between now  and time colonoscopy is to be performed. In the meantime, I have encouraged  her to continue taking Citrucel daily as this I feel is helping this nice  lady and finished a course of Proctofoam as previously prescribed by Dr.  Neita Carp.   Gastroesophageal reflux disease reflux disease symptoms seem to be well  controlled on a b.i.d. regimen of Prevacid. She is to continue that for the  time being. Further recommendations to follow.   I would like to thank Dr. Fara Chute for allowing me to see this nice lady  once again.       RMR/MEDQ  D:  06/06/2005  T:  06/06/2005  Job:  [id number]

## 2011-05-06 NOTE — Op Note (Signed)
NAMECARIGAN, LISTER             ACCOUNT NO.:  [id number]   MEDICAL RECORD NO.:  [id number]          PATIENT TYPE:  AMB   LOCATION:  DAY                           FACILITY:  [facility]   PHYSICIAN:  R. Roetta Sessions, M.D. DATE OF BIRTH:  1930/05/25   DATE OF PROCEDURE:  DATE OF DISCHARGE:                               OPERATIVE REPORT   PREOPERATIVE DIAGNOSIS:   POSTOPERATIVE DIAGNOSIS:   PROCEDURE:  Esophagogastroduodenoscopy with biopsy.   SURGEON:  Dr. Jonathon Bellows.   ANESTHESIA:   INDICATIONS FOR PROCEDURE:  The patient is a 77 year old lady with  recent atypical chest and upper abdominal discomfort for which she is  admitted times two to ________ Hospital.  She is currently admitted  there now.  Dr. Neita Carp called me and asked me to look at her upper GI  tract.  This lady has had extensive cardiac evaluation and the cause for  symptoms have not been found.  She did have a HIDA scan last night with  [facility].  She was given two doses of CCK, and her gallbladder EF was zero.   The patient tells me this morning that she has some vague reproduction  of her chest syndromes after this study was done last evening.   It is notable this lady had a HIDA with fatty meal challenge ordered by  me over at [facility] back on August 02, 2005.  Her gallbladder EF was  71% at that time, with fatty meal challenge.  An EGD is now being done  to ________ potential risks, benefits and alternatives have been  reviewed.  She does have an erythematous rash on her trunk and arms  which she has had for a few days.  It is thought that this may be due to  sulfa reaction versus CT contrast reaction.   MONITORING:  Oxygen saturation, blood pressure, and pulse oximetry were  monitored throughout the entire procedure.   SEDATION:  Versed 3 mg IV, Demerol 50 mg IV in divided doses.  Cetacaine  spray for topical pharyngeal anesthesia.  She had Benadryl 25 mg IV.   INSTRUMENT:  Phentex video system.   FINDINGS:  Examination of the esophagus revealed normal-appearing  esophageal mucosa.  The EG junction was patulous and easily traversed.  Entering the stomach, the ________ gastric area was empty and  insufflated with air.  During the examination, the gastric mucosa,  retroflexion of the proximal stomach, esophagogastric junction, ________  , hiatal hernia, and multiple 3 to 4 mm fundal gland appearing polyps.  The gastric mucosa otherwise appeared normal.  The pylorus was patent  and easily traversed.  Examination of the bulb and second portion  revealed no abnormalities.  Therapeutic/diagnostic maneuver was  performed:  One of fundal gland polyps were biopsied for histologic  study.   The patient tolerated the procedure well ________ endoscopy.   IMPRESSION:  1. Normal esophagus.  2. Patulous EG junction, small to moderate sized hiatal hernia,      multiple fundal gland type appearing polyps biopsied, otherwise      normal.  Stomach:  Normal ________ .  DISCUSSION:  EG findings do not explain the patient's recent symptoms.  She does appear to have significant biliary dyskinesia on a recent HIDA  with CCK challenge times two.  This is noted to be a marked diminution  in gallbladder function as compared to the HIDA with fatty meal  challenge back in August of 2006.  She does report to me a vague  reproduction of her chest symptoms last evening after the Hiatus scan.   Her symptoms are atypical and somewhat unusual.   RECOMMENDATIONS:  1. Continue proton pump inhibitor.  2. Go ahead and consider elective cholecystectomy, although, it cannot      be guaranteed that all of her symptoms would subside once she gets      her gallbladder out.  3. I have discussed my findings and recommendations with Dr. Fara Chute, as well as the patient and family members this morning.      Jonathon Bellows, M.D.  Electronically Signed     RMR/MEDQ  D:  12/21/2006  T:  12/21/2006  Job:   [id number]   cc:   Fara Chute  Fax: [phone]   Jonita Albee, [facility]

## 2011-05-06 NOTE — Assessment & Plan Note (Signed)
[name], [name]              CHART#:  [id number]   DATE:  12/18/2007                       DOB:  [birth year]   Follow-up Gastroesophageal Reflux Disease, gastric polyps proven to be  benign on EGD status post microscopic cholecystectomy related to  dyskinesia early 2008, left sided diverticulosis on 2003 colonoscopy.   Chronic constipation.  Last seen 04/09/07.  She has been under quite a  bit of stress this past year with her brother, has advanced Alzheimer's.  She did quit her job but then started doing some part time work back  with employer again.  She does not take her fiber all the time.  She  took Librarian, academic for a few weeks (samples). Felt much better but then came off  this agent.  She does take a generic fiber supplement daily when she  remembers.  She has not had any rectal bleeding or melena.  Her weight  is up 4 pounds from what it was in 03/2007.   CURRENT MEDICATIONS:  See updated list.   ALLERGIES:  Penicillin. codeine, morphine, Indocin, cephalexin.   PHYSICAL EXAMINATION:  VITALS:  She appears her baseline, weight 212,  height 5 feet 10 inches, temp 97.8, blood pressure 130/84, pulse is 72.  CHEST:  Lungs are clear to auscultation.  CARDIAC EXAM:  Regular rate and rhythm without murmur, gallop or rub.  ABDOMEN:  Nondistended.  Positive bowel sounds soft, entirely nontender,  no appreciable mass or organomegaly.   ASSESSMENT/PLAN:  1. GERD symptoms controlled on Prevacid.  She is taking the evening      dose at bed time.  I have asked her to take it before supper as      taking this agent before a meal works better.  2. Back on a probiotic.  Will start on some Align samples one tablet      daily. She should take a daily fiber supplement and then take it      every day.  Unless something comes up plan to see this nice lady      back in 6 months.      Jonathon Bellows, M.D.  Electronically Signed    RMR/MEDQ  D:  11/28/2007  T:  11/29/2007  Job:  [id number]   cc:   Fara Chute

## 2011-05-06 NOTE — Consult Note (Signed)
[facility]  Patient:    [name], [name] Visit Number: [id number] MRN: [id number]          Service Type: [facility] Location: [facility] Attending Physician:  Aundra Dubin Dictated by:   Nathaneil Canary, M.D. Proc. Date: 04/25/02 Admit Date:  04/25/2002   CC:         Paul L. Neita Carp, M.D.   Consultation Report  CHIEF COMPLAINT:  Right knee, left ankle, back pain.  BRIEF HISTORY:  The patient returns for an earlier appointment because her left knee has been aching and swelling for 1 week.  She remembers no trauma. She had the knee x-rayed through Dr. Neita Carp yesterday.  It hurts when she puts her weight on it.  Another problem is the left ankle.  This hurts to the lateral ankle and also hurts with walking.  Her weight is down 4 pounds and is overall stable.  Her back pain persists but is okay.  She has not been able to return to doing her water aerobics.  There have been no swollen joints except possibly the right knee.  There has been no fever, cough, URIs, rashes, or shortness of breath.  MEDICATIONS: 1. Celebrex 200 mg q.d. 2. Premarin 0.625 mg q. day. 3. Synthroid 75 mcg q. day. 4. Darvocet p.r.n. 5. Aciphex q. day. 6. Elavil 10 mg q. day. 7. Actonel 35 mg q. week.  PHYSICAL EXAMINATION:  VITAL SIGNS:  Weight 220 pounds.  Blood pressure 100/70, respirations 16.  GENERAL:  No distress.  LUNGS:  Clear.  HEART:  Regular.  EXTREMITIES:  Lower extremities:  Trace edema.  MUSCULOSKELETAL:  The hands show minor degenerative type changes and are cool and nontender.  Wrists, elbows, shoulders have good range of motion.  The back has mild tenderness across the low back.  The right knee has mild warmth and no appreciable effusion.  She has moderate medial joint line tenderness.  The left ankle is cool but very tender anteriorly to the lateral malleolus.  PROCEDURE:  Right knee injections.  DESCRIPTION OF PROCEDURE:  The skin was cleaned with  Betadine and alcohol swabs and cooled with ethyl chloride.  Then 80 mg of Depo-Medrol and 1 cc. of 2% lidocaine was injected.  ASSESSMENT AND PLAN: 1. Right knee pain.  This could be osteoarthritis or a problem with the    meniscus or cartilage.  She will bring the x-rays on return so I can review    them.   She is advised to rest the knee for 24 hours and then resume fairly    normal activities. 2. Foot pain.  I felt this is a neuropathy.  I cannot really tell that it is    any better with low dose Elavil.  She will continue with this as she has    found that it is fairly reasonable for her sleep. 3. Left ankle pain.  This may be a tendonitis type of problem.  It seemed to    be very tender at the lateral malleolus.  I would consider a possible    injection or return.  FOLLOWUP:  I will see her back on May 28, 2002. Dictated by:   Nathaneil Canary, M.D. Attending Physician:  Aundra Dubin DD:  04/25/02 TD:  04/26/02 Job: [id number]

## 2011-05-06 NOTE — Consult Note (Signed)
[facility]  Patient:    [name], [name] Visit Number: [id number] MRN: [id number]          Service Type: [facility] Location: [facility] Attending Physician:  Donnal Moat Dictated by:   Nathaneil Canary, M.D. Proc. Date: 03/07/02 Admit Date:  03/07/2002   CC:         Paul L. Neita Carp, M.D.   Consultation Report  CHIEF COMPLAINT:  Back pain.  BRIEF HISTORY:  Mr. Burklow returns for follow up and she has had several things to occur since seeing her in early January.  She had 6 weeks of a sharp headache.  She was placed on prednisone for a short period of time but she said this did not help and gave her side effects.  She eventually went to a headache clinic with Dr. Santiago Glad.  Her headaches are better at this time.  She woke up with a sharp headache.  She was offered Tegretol but did not want to take this.  At this time she says her back is still hurting.  She was advised to stop her exercises when the headache was acute.  She has started to do this some recently and finds that it does help her back.  She has also had an EGD and colonoscopy since seeing her.  Her weight is up 5 lbs.  She is not having visual changes.  She does have some achiness in her jaw but not with eating.  She feels her right knee has been swollen.  MEDICATIONS: 1. Guafenex p.r.n. 2. Celebrex 200 mg q. day. 3. Premarin 0.625 mg q. day. 4. Synthroid 0.75 mg q. day. 5. Darvocet b.i.d. 6. Aciphex 1 q. day. 7. Elavil 10 mg q. day.  PHYSICAL EXAMINATION:  VITAL SIGNS:  Weight 224 pounds.  Blood pressure 130/80, respirations 18.  GENERAL:  No distress.  LUNGS:  Clear.  HEART: Regular, no murmur.  EXTREMITIES:  Lower extremities trace edema.  MUSCULOSKELETAL:  Hands and wrists are cool and nontender.  Elbows, shoulders, good range of motion.  BACK:  Was tender across the low back but only mildly. The right knee is mildly swollen and has possibly a slight effusion and  was warm.  It was not very tender with flexion.  The left knee was cool and nontender.  The ankles and feet were nontender.  ASSESSMENT AND PLAN: 1. BACK PAIN:  I suspect that the back and knee are primarily osteoarthritis    type of pain.  She will continue with the celebrex and darvocet.  I believe    she is safe in continuing with her exercises.  She has been going to water    aerobics and this would be fine.  2. FOOT PAIN:  This is a suspected neuropathy.  She has been started today on    Elavil at 10 mg h.s.  3. HEADACHES: She also has some vague achiness to her jaw.  We will check and    ESR again.  It was normal when we checked it about 4 months ago.  The    pattern of waking up with a headache is not very suggestive of temporal    arteritis.  FOLLOWUP: She will return in 3 months. Dictated by:   Nathaneil Canary, M.D. Attending Physician:  Donnal Moat DD:  03/07/02 TD:  03/08/02 Job: [id number]

## 2011-05-06 NOTE — H&P (Signed)
NAMEMARTIZA, Collins             ACCOUNT NO.:  [id number]   MEDICAL RECORD NO.:  [id number]          PATIENT TYPE:  OUT   LOCATION:  RAD                           FACILITY:  [facility]   PHYSICIAN:  R. Roetta Sessions, M.D. DATE OF BIRTH:  [birth year]   DATE OF ADMISSION:  06/30/2005  DATE OF DISCHARGE:  07/13/2006LH                                HISTORY & PHYSICAL   REASON FOR VISIT:  Follow up on gastroesophageal reflux disease, early study  pruritus ani, difficulty evacuating.   HISTORY OF PRESENT ILLNESS:  Mrs.  [name] was last seen October 13, 2005.  She was doing fairly well.  We tried her on some AnaMantle for her rectal  itching as this helps tremendously when she has it.  She uses on a p.r.n.  basis. She continues to take Prevacid 30 mg orally b.i.d. with fairly good  control of her reflux symptoms.  She has been unable to loose any weight.  She does complain of heart valve movements and difficulty cleaning herself  after a bowel movement on a regular basis.  Although, she does have a bowel  movement 1-2 times daily, she is only taken two Citrucel pills daily.  Otherwise, she has had on current illnesses.  She has been tied up with her  daughter's surgical problems out of town.   CURRENT MEDICATIONS:  See updated list.   ALLERGIES:  INDOCIN, CEPHALOSPORIN, MORPHINE, PENICILLIN, CODEINE.   PHYSICAL EXAMINATION:  VITAL SIGNS:  She appears at baseline, weight 209.5,  height 5 feet 10, temperature 97.4, blood pressure 120/80, pulse 64.  SKIN:  Warm and dry.  ABDOMEN:  Obese.  Positive bowel sounds.  Soft, nontender without  appreciable mass or organomegaly.   ASSESSMENT:  1.  Gastroesophageal reflux symptoms well controlled on b.i.d. Prevacid.  I      do not think we can really back her off as she will likely have flaring      symptoms.  She really needs to loose weight.  We discussed this goal at      some length.  2.  Some relative constipation.  She is really not taking  enough fiber      supplements, although, her bowel movement frequency is okay.  I have      asked her to stop Citrucel and start taking a syllium fiber supplement      by way of Metamucil powder or wafers with adequate fluids daily.      Samples given.  3.  Pruritus ani.  Will continued the AnaMantle cream on a p.r.n. basis.      Hopefully, bulking her fiber intake will help with personal hygiene and      her pruritus ani symptoms.   Unless something comes up, plan to see this nice lady back in three months.      Jonathon Bellows, M.D.  Electronically Signed     RMR/MEDQ  D:  12/01/2005  T:  12/01/2005  Job:  [id number]

## 2011-05-06 NOTE — Op Note (Signed)
[name], [name]             ACCOUNT NO.:  [id number]   MEDICAL RECORD NO.:  [id number]          PATIENT TYPE:  AMB   LOCATION:  DAY                           FACILITY:  [facility]   PHYSICIAN:  R. Roetta Sessions, M.D. DATE OF BIRTH:  1930/07/05   DATE OF PROCEDURE:  06/16/2005  DATE OF DISCHARGE:                                 OPERATIVE REPORT   PROCEDURE:  Colonoscopy followed by diagnostic esophagogastroduodenoscopy.   INDICATIONS FOR PROCEDURE:  The patient is a 77 year old lady found to be  Hemoccult positive. There has been no overt GI bleeding. Colonoscopy is now  being done. She also has told me today that she has had some early satiety  and does not have much of an appetite. Her symptoms are well controlled on  Prevacid. She does take Celebrex. Colonoscopy is now being done. I told her  if colonoscopy were entirely unremarkable we would consider doing an EGD.  She has asked that we do an EGD either way. Her gallbladder remains in situ.  She denies frank abdominal pain. Colonoscopy and EGD are now being done.  This approach has been discussed with patient at length. Potential risks,  benefits, and alternatives have been reviewed and questions answered. She is  agreeable. Please see documentation in the medical record.   PROCEDURE NOTE:  O2 saturation, blood pressure, pulse, and respirations were  monitored throughout the entire procedure. Conscious sedation with Versed 5  mg IV and Demerol 75 mg IV in divided doses.   FINDINGS:  COLONOSCOPY:  Digital rectal exam revealed no abnormalities.   ENDOSCOPIC FINDINGS:  Prep was adequate.   Rectum:  Examination of the rectal mucosa revealed only internal  hemorrhoids. Rectal vault was somewhat small. I was unable to retroflex  today. For the same reason, I was able to see the rectal mucosa very well.   Colon:  Colonic mucosa was surveyed from the rectosigmoid junction through  the left, transverse, and right colon to the area of  the appendiceal  orifice, ileocecal valve, and cecum. These structures were well seen and  photographed for the record. The terminal ileum was intubated to 10 cm. From  this level, the scope was slowly withdrawn, and all previously mentioned  mucosal surfaces were again seen. The patient was noted to have sigmoid  diverticula. The remainder of colonic mucosa appeared normal. The patient  tolerated the procedure well and was prepared for upper endoscopy.   FINDINGS:  ESOPHAGOGASTRODUODENOSCOPY:  Examination of the tubular esophagus  revealed a somewhat patulous EG junction. Otherwise esophageal mucosa  appeared normal.   Stomach:  Gastric cavity was empty and insufflated well with air. Thorough  examination of gastric mucosa including retroflexed view of the proximal  stomach and esophagogastric junction demonstrated multiple 2 to 3 mm fundal  gland polyps and a small hiatal hernia. Pylorus was patent and easily  traversed. Examination of bulb and second portion revealed no abnormalities.   THERAPEUTIC/DIAGNOSTIC MANEUVERS:  None.   The patient tolerated the procedure well and was reactive to endoscopy.   IMPRESSION:  COLONOSCOPY:  Internal hemorrhoids. Otherwise normal colon.  Left  sided diverticula. The remainder of colonic mucosa and terminal ileum  mucosal appeared normal.   ESOPHAGOGASTRODUODENOSCOPY:  Patulous esophagogastric junction with small  hiatal hernia. Otherwise normal esophagus and stomach aside from fundal  gland polyps. Normal D1 and D2.   RECOMMENDATIONS:  Will go ahead and get a gallbladder ultrasound. Her upper  GI symptoms are somewhat nonspecific. We will see where we stand with her  hemoglobin and baseline LFTs today. She has no significant pathology in  either upper or lower GI tract to explain the Hemoccult positive stool. She  certainly might have NSAID-induced damage of her small bowel somewhere along  the way, producing a Hemoccult positive stool. Will  review the above  mentioned studies when they become available and then will decide about a  capsule study of her small bowel. Further recommendations to follow.       RMR/MEDQ  D:  06/16/2005  T:  06/16/2005  Job:  [id number]   cc:   Fara Chute  [address] [place]  [place] [address]  Fax: [phone]

## 2011-05-06 NOTE — Assessment & Plan Note (Signed)
[facility]  Patient:    [name], [name] Visit Number: [id number] MRN: [id number]          Service Type: REC Location: [facility] Attending Physician:  Estanislado Pandy Dictated by:   Nathaneil Canary, M.D. Admit Date:  12/20/2001   CC:         Darreld Mclean, M.D.  Paul L. Neita Carp, M.D.   [facility]  CHIEF COMPLAINT:  Back pain, foot pain, hands.  This is the first office visit since seeing Ms. Eichler on October 10, 2001. She brought by the office, in early November, notes describing an ER visit on June 05, 2000.  The diagnosis was lumbar strain and x-rays showed disk narrowing at L5-S1.  She worked with a Dr. Leary Roca at some point.  X-rays of her back showed a possible pars fracture and spondylolisthesis in June 2001. The x-ray performed on October 10, 2001 showed five lumbar vertebrae and vertebroplasty noted at L4-5.  Labs from October 10, 2001 showed an ESR of 2, WBC 6.6, hemoglobin 14.7, platelets 195,000, AST 31, calcium 9.5, creatinine 0.8, glucose 79, albumin 3.8.  At this time, she continues to hurt in her feet but they are some improved. She worked with Dr. Denny Peon. Ulice Brilliant, who has performed further injections in the toe area.  I believe she was tried on Neurontin but was able to take it less than two weeks because of significant reflux.  Her weight is up 7 pounds. There has been no hand swelling but her hands ache.  She denies headaches, jaw claudication or visual changes.  She continues to ache across the low back, more on the left, and there is no radiating pain.  Her sleep is moderately nonrestorative but she says she generally sleeps well.  The past medical history, drug intolerances, family and social history are all reviewed from October 10, 2001 and are unchanged.  MEDICINES: 1. Celebrex 200 mg q.d. 2. Nexium 40 mg q.d. 3. Premarin 0.625 mg q.d. 4. Synthroid 75 mcg q.d. 5. Recent Z-Pak. 6. Darvocet p.r.n. 7. Tylenol  Extra-Strength p.r.n. 8. Guaifenex half b.i.d.  PHYSICAL EXAMINATION:  VITAL SIGNS:  Weight is 219 pounds.  Blood pressure 150/80, respirations 16, pulse 76.  GENERAL:  No distress.  SKIN:  Clear.  NECK:  Negative JVD.  LUNGS:  Clear.  HEART:  Regular with no murmur.  EXTREMITIES:  Lower extremities:  Trace edema.  MUSCULOSKELETAL:  There is no hand or finger swelling or tenderness.  Wrists, elbows, shoulders:  Good range of motion.  Trigger points at the elbow, shoulder, neck, occiput, anterior chest are mildly tender.  Low back is tender, particularly at the left SI area.  There was no guarding with movement on the table.  The hips, knees and ankles have a good range of motion and show no arthritis or swelling.  The feet have bunion formation and some overlapping toes.  They were moderately tender with compression.  NEUROLOGIC:  Strength is 5/5.  Negative SLRs.  DTRs are 2+ throughout.  A&O x3.  Sensation intact.  ASSESSMENT AND PLAN: 1. Back pain.  This is the problem that I can continue to help her with.  I    will have her go to physical therapy to learn some stretching and range of    motion to see if this will improve how she is doing.  The Celebrex helps    her back and hands.  I would not add further medicines at this time.  She    also uses Tylenol and occasional Darvocet.  I do not believe that she has    an herniated nucleus pulposus. 2. Foot pain.  She continues to work with Dr. Ulice Brilliant.  It seems that she did    not tolerate the Neurontin. 3. Doubtful polymyalgia rheumatica.  She is not describing pain in a pattern    that I would consider this diagnosis at this point.  She has also had two    fairly recent erythrocyte sedimentation rates that were normal. 4. Hypothyroidism.  She will return in two months.  TIME:  Forty minutes. Dictated by:   Nathaneil Canary, M.D. Attending Physician:  Estanislado Pandy DD:  12/20/01 TD:  12/20/01 Job: [id number]

## 2011-05-30 ENCOUNTER — Encounter: Payer: Self-pay | Admitting: Cardiology

## 2011-06-03 ENCOUNTER — Ambulatory Visit (INDEPENDENT_AMBULATORY_CARE_PROVIDER_SITE_OTHER): Payer: Medicare Other | Admitting: Internal Medicine

## 2011-06-03 ENCOUNTER — Encounter: Payer: Self-pay | Admitting: Internal Medicine

## 2011-06-03 DIAGNOSIS — R059 Cough, unspecified: Secondary | ICD-10-CM

## 2011-06-03 DIAGNOSIS — K219 Gastro-esophageal reflux disease without esophagitis: Secondary | ICD-10-CM

## 2011-06-03 DIAGNOSIS — R05 Cough: Secondary | ICD-10-CM

## 2011-06-03 NOTE — Progress Notes (Signed)
Primary Care Physician:  Estanislado Pandy, MD  Primary Gastroenterologist:  Dr. Jena Gauss  Chief Complaint  Patient presents with  . Follow-up    cough alot and has hoarness    HPI:  [name] is a 77 y.o. female here here for followup. Long-standing GERD has been well-controlled on Dexilant 60 mg orally daily she had been on a twice a day regimen but were able to back off to once daily. She does have a breakthrough reflux symptoms she describes as heartburn 3 times or so weakly. No dysphagia done of angina rectal bleeding or melena. She continues to be troubled with chronic nonproductive cough. She continues to be significantly over her ideal body weight. She has a tendency to consume quite a bit of junk last colonoscopy 2006. Diverticulosis. Due for screening 2016.  Past Medical History  Diagnosis Date  . Syncope and collapse   . Allergy to radiographic dye   . Diverticulosis of colon (without mention of hemorrhage)   . Pruritus ani   . Irritable bowel syndrome   . GERD (gastroesophageal reflux disease)   . Peripheral polyneuropathy     confirmed by EMG and nerve conduction velocities   . Migraine     syncope with recurrence. postive table-tilt test on Florinef. reuled out pulmonary embolism in 2007. no evidence of structural heart disease.    Past Surgical History  Procedure Date  . Wrist surgery   . Foot surgery   . Total abdominal hysterectomy   . Appendectomy   . Back surgery   . Back pain     going to chiropracator for back and hip pain    Current Outpatient Prescriptions  Medication Sig Dispense Refill  . B Complex-Folic Acid (SUPER B COMPLEX MAXI) TABS Take 1 tablet by mouth daily.        . calcium carbonate (OS-CAL) 600 MG TABS Take 600 mg by mouth daily.        . cefUROXime (CEFTIN) 250 MG tablet Take 250 mg by mouth 2 (two) times daily.        . Cranberry Juice Extract 1000 MG CAPS Take 2 capsules by mouth daily.        Marland Kitchen dexlansoprazole (DEXILANT) 60 MG  capsule Take 60 mg by mouth every evening.        . docusate sodium (COLACE) 100 MG capsule Take 100 mg by mouth daily.        Marland Kitchen FLUDROCORTISONE ACETATE PO Take 1 tablet by mouth. Take 1 0.1mg  tablet every M, W, Fr.       . fluticasone (FLONASE) 50 MCG/ACT nasal spray Place 2 sprays into the nose as needed.        . folic acid (FOLVITE) 800 MCG tablet Take 800 mcg by mouth daily.        . Glucosamine-Chondroitin-MSM (TRIPLE FLEX) 500-400-125 MG TABS Take 1 tablet by mouth daily.        Marland Kitchen levothyroxine (SYNTHROID) 50 MCG tablet Take 50 mcg by mouth daily.        . Lidocaine-Hydrocortisone Ace 3-0.5 % CREA Apply topically as needed.        . loratadine (CLARITIN) 10 MG tablet Take 10 mg by mouth daily.        . montelukast (SINGULAIR) 10 MG tablet Take 10 mg by mouth daily.        . Multiple Vitamins-Minerals ([employer] SPECTRAVITE SENIOR) TABS Take 1 tablet by mouth daily.        . Probiotic Product (  ALIGN PO) Take by mouth daily.        . solifenacin (VESICARE) 10 MG tablet Take 10 mg by mouth daily.        Marland Kitchen topiramate (TOPAMAX) 25 MG tablet Take 50 mg by mouth 2 (two) times daily.        . vitamin C (ASCORBIC ACID) 500 MG tablet Take 500 mg by mouth daily.        . predniSONE (DELTASONE) 20 MG tablet Take 20 mg by mouth daily.          Allergies as of 06/03/2011 - Review Complete 06/03/2011  Allergen Reaction Noted  . Cephalexin  02/25/2009  . Codeine  02/25/2009  . Indomethacin  02/25/2009  . Levofloxacin  02/25/2009  . Morphine  02/25/2009  . Penicillins  02/25/2009  . Sulfamethoxazole w/trimethoprim      Family History  Problem Relation Age of Onset  . Colon cancer Neg Hx     History   Social History  . Marital Status: Widowed    Spouse Name: N/A    Number of Children: N/A  . Years of Education: N/A   Occupational History  . Not on file.   Social History Main Topics  . Smoking status: Unknown If Ever Smoked  . Smokeless tobacco: Not on file   Comment: tobacco use - no    . Alcohol Use: No  . Drug Use: Yes  . Sexually Active: Not on file   Other Topics Concern  . Not on file   Social History Narrative   Retired, widowed, gets regular exercise.       ROS:  General: Negative for anorexia, weight loss, fever, chills, fatigue, weakness. Eyes: Negative for vision changes.  ENT: Negative for hoarseness, difficulty swallowing , nasal congestion. CV: Negative for chest pain, angina, palpitations, dyspnea on exertion, peripheral edema.  Respiratory: Negative for dyspnea at rest, dyspnea on exertion; patient does have chronic; no sputum, wheezing.  GI: See history of present illness. GU:  Negative for dysuria, hematuria, urinary incontinence, urinary frequency, nocturnal urination.  MS: Negative for joint pain, low back pain.  Derm: Negative for rash or itching.  Neuro: Negative for weakness, abnormal sensation, seizure, frequent headaches, memory loss, confusion.  Psych: Negative for anxiety, depression, suicidal ideation, hallucinations.  Endo: Negative for unusual weight change.  Heme: Negative for bruising or bleeding. Allergy: Negative for rash or hives.    Physical Examination: BP 124/72  Pulse 68  Temp(Src) 97.3 F (36.3 C) (Temporal)  Ht 5\' 10"  (1.778 m)  Wt 212 lb 12.8 oz (96.525 kg)  BMI 30.53 kg/m2   General: Well-nourished, well-developed in no acute distress.  Head: Normocephalic, atraumatic.   Eyes: Conjunctiva pink, no icterus. Mouth: Oropharyngeal mucosa moist and pink , no lesions erythema or exudate. Neck: Supple without thyromegaly, masses, or lymphadenopathy.  Lungs: Clear to auscultation bilaterally.  Heart: Regular rate and rhythm, no murmurs rubs or gallops.  Abdomen: Bowel sounds are normal, nontender, nondistended, no hepatosplenomegaly or masses, no abdominal bruits or    hernia , no rebound or guarding.   Extremities: No lower extremity edema.  Neuro: Alert and oriented x 4 , grossly normal neurologically.  Skin:  Warm and dry, no rash or jaundice.   Psych: Alert and cooperative, normal mood and affect.

## 2011-06-03 NOTE — Assessment & Plan Note (Signed)
Fairly well controlled with Exelon however more recently she has had breakthrough heartburn symptoms. No alarm features. She is significantly over her ideal body weight and dietary indiscretion, no doubt, is contributing to her symptoms.  Recommendations: Anti-reflux diet/lifestyle reviewed. I recommended a 10-15 pound weight loss over the next 12 months. For the next month increase Dexilant 60 mg orally twice daily samples provided.  She has a nagging cough. This is bothersome for this nice lady. I suggested she followup with Dr. Neita Carp in regards to this problem. At this time, I am not inclined to think that her reflux is related to cough although it would remain a remote possibility.  We'll plan to see this nice lady back in 6 months.  Due for probably one more screening colonoscopy in 2016.

## 2011-06-08 ENCOUNTER — Telehealth: Payer: Self-pay | Admitting: *Deleted

## 2011-06-08 ENCOUNTER — Encounter: Payer: Self-pay | Admitting: *Deleted

## 2011-06-08 ENCOUNTER — Ambulatory Visit (INDEPENDENT_AMBULATORY_CARE_PROVIDER_SITE_OTHER): Payer: Medicare Other | Admitting: Cardiology

## 2011-06-08 ENCOUNTER — Encounter: Payer: Self-pay | Admitting: Cardiology

## 2011-06-08 VITALS — BP 123/79 | HR 64 | Ht 70.0 in | Wt 212.0 lb

## 2011-06-08 DIAGNOSIS — I340 Nonrheumatic mitral (valve) insufficiency: Secondary | ICD-10-CM | POA: Insufficient documentation

## 2011-06-08 DIAGNOSIS — R55 Syncope and collapse: Secondary | ICD-10-CM

## 2011-06-08 DIAGNOSIS — I2789 Other specified pulmonary heart diseases: Secondary | ICD-10-CM

## 2011-06-08 DIAGNOSIS — R072 Precordial pain: Secondary | ICD-10-CM

## 2011-06-08 DIAGNOSIS — R079 Chest pain, unspecified: Secondary | ICD-10-CM

## 2011-06-08 NOTE — Telephone Encounter (Signed)
No precert required 

## 2011-06-08 NOTE — Assessment & Plan Note (Signed)
The patient has both typical and atypical chest pain features. We'll schedule her for a dobutamine echocardiogram as well as a resting echocardiogram.

## 2011-06-08 NOTE — Assessment & Plan Note (Signed)
We'll schedule followup echocardiogram.

## 2011-06-08 NOTE — Progress Notes (Signed)
HPI The patient is a 77 year old female with a history of neurocardiogenic syncope, normal LV function, mild mitral regurgitation moderate pulmonary hypertension. Just prior history of dizziness but has had no recurrent problems on Florinef. She has been doing well. She reports no orthopnea PND she reports no palpitations or syncope. She had some spinal problem but he appeared to have resolved. The patient does report some left-sided chest pain slightly worsened with exertion but also worsened with palpation.  Allergies  Allergen Reactions  . Cephalexin   . Codeine   . Indomethacin   . Levofloxacin   . Morphine   . Penicillins   . Sulfamethoxazole W/Trimethoprim     Current Outpatient Prescriptions on File Prior to Visit  Medication Sig Dispense Refill  . B Complex-Folic Acid (SUPER B COMPLEX MAXI) TABS Take 1 tablet by mouth daily.        . calcium carbonate (OS-CAL) 600 MG TABS Take 600 mg by mouth daily.        . cefUROXime (CEFTIN) 250 MG tablet Take 250 mg by mouth 2 (two) times daily.        . Cranberry Juice Extract 1000 MG CAPS Take 2 capsules by mouth daily.        Marland Kitchen dexlansoprazole (DEXILANT) 60 MG capsule Take 60 mg by mouth every evening.        . docusate sodium (COLACE) 100 MG capsule Take 100 mg by mouth daily.        Marland Kitchen FLUDROCORTISONE ACETATE PO Take 1 tablet by mouth. Take 1 0.1mg  tablet every M, W, Fr.       . fluticasone (FLONASE) 50 MCG/ACT nasal spray Place 2 sprays into the nose as needed.        . folic acid (FOLVITE) 800 MCG tablet Take 800 mcg by mouth daily.        . Glucosamine-Chondroitin-MSM (TRIPLE FLEX) 500-400-125 MG TABS Take 1 tablet by mouth daily.        Marland Kitchen levothyroxine (SYNTHROID) 50 MCG tablet Take 50 mcg by mouth daily.        . Lidocaine-Hydrocortisone Ace 3-0.5 % CREA Apply topically as needed.        . loratadine (CLARITIN) 10 MG tablet Take 10 mg by mouth daily.        . montelukast (SINGULAIR) 10 MG tablet Take 10 mg by mouth daily.        .  Multiple Vitamins-Minerals (CVS SPECTRAVITE SENIOR) TABS Take 1 tablet by mouth daily.        . predniSONE (DELTASONE) 20 MG tablet Take 20 mg by mouth daily.        . Probiotic Product (ALIGN PO) Take by mouth daily.        . solifenacin (VESICARE) 10 MG tablet Take 10 mg by mouth daily.        Marland Kitchen topiramate (TOPAMAX) 25 MG tablet Take 50 mg by mouth 2 (two) times daily.        . vitamin C (ASCORBIC ACID) 500 MG tablet Take 500 mg by mouth daily.          Past Medical History  Diagnosis Date  . Syncope and collapse     Neurocardiogenic syncope with positive tilt table test on Florinef  . Allergy to radiographic dye   . Diverticulosis of colon (without mention of hemorrhage)   . Pruritus ani   . Irritable bowel syndrome   . GERD (gastroesophageal reflux disease)   . Peripheral polyneuropathy  confirmed by EMG and nerve conduction velocities   . Migraine     syncope with recurrence. postive table-tilt test on Florinef. reuled out pulmonary embolism in 2007. no evidence of structural heart disease.  . Pulmonary nodule   . Mild mitral regurgitation by prior echocardiogram     Past Surgical History  Procedure Date  . Wrist surgery   . Foot surgery   . Total abdominal hysterectomy   . Appendectomy   . Back surgery   . Back pain     going to chiropracator for back and hip pain    Family History  Problem Relation Age of Onset  . Colon cancer Neg Hx     History   Social History  . Marital Status: Widowed    Spouse Name: N/A    Number of Children: N/A  . Years of Education: N/A   Occupational History  . Not on file.   Social History Main Topics  . Smoking status: Never Smoker   . Smokeless tobacco: Never Used   Comment: tobacco use - no  . Alcohol Use: No  . Drug Use: Yes  . Sexually Active: Not on file   Other Topics Concern  . Not on file   Social History Narrative   Retired, widowed, gets regular exercise.     :[id number] positives as outlined above.  The remainder of the 18  point review of systems is negative  PHYSICAL EXAM BP 123/79  Pulse 64  Ht 5\' 10"  (1.778 m)  Wt 212 lb (96.163 kg)  BMI 30.42 kg/m2  General: Well-developed, well-nourished in no distress Head: Normocephalic and atraumatic Eyes:PERRLA/EOMI intact, conjunctiva and lids normal Ears: No deformity or lesions Mouth:normal dentition, normal posterior pharynx Neck: Supple, no JVD.  No masses, thyromegaly or abnormal cervical nodes Lungs: Normal breath sounds bilaterally without wheezing.  Normal percussion Cardiac: regular rate and rhythm with normal S1 and S2, no S3 or S4.  PMI is normal.  No pathological murmurs Abdomen: Normal bowel sounds, abdomen is soft and nontender without masses, organomegaly or hernias noted.  No hepatosplenomegaly MSK: Back normal, normal gait muscle strength and tone normal Vascular: Pulse is normal in all 4 extremities Extremities: No peripheral pitting edema Neurologic: Alert and oriented x 3 Skin: Intact without lesions or rashes Lymphatics: No significant adenopathy Psychologic: Normal affect   ECG: Normal sinus rhythm normal EKG.  ASSESSMENT AND PLAN

## 2011-06-08 NOTE — Assessment & Plan Note (Signed)
Doing well on Florinef. No recurrent problems. No syncope.

## 2011-06-08 NOTE — Telephone Encounter (Signed)
dobutamine echocardiogram scheduled for 06-10-2011 @ [facility] Checking percert

## 2011-06-08 NOTE — Patient Instructions (Signed)
Your physician wants you to follow-up in: 6 months. You will receive a reminder letter in the mail one-two months in advance. If you don't receive a letter, please call our office to schedule the follow-up appointment. Your physician has requested that you have a dobutamine echocardiogram. For further information please visit [email or web address] Please follow instruction sheet as given. Your physician recommends that you continue on your current medications as directed. Please refer to the Current Medication list given to you today.

## 2011-06-10 DIAGNOSIS — R079 Chest pain, unspecified: Secondary | ICD-10-CM

## 2011-06-15 ENCOUNTER — Other Ambulatory Visit: Payer: Self-pay | Admitting: Cardiology

## 2011-06-15 ENCOUNTER — Telehealth: Payer: Self-pay | Admitting: *Deleted

## 2011-06-15 DIAGNOSIS — I517 Cardiomegaly: Secondary | ICD-10-CM

## 2011-06-15 NOTE — Telephone Encounter (Signed)
Patient notified of stress echo results & verbalized understanding.

## 2011-06-21 ENCOUNTER — Other Ambulatory Visit: Payer: Self-pay | Admitting: Cardiology

## 2011-06-21 DIAGNOSIS — R002 Palpitations: Secondary | ICD-10-CM

## 2011-06-23 ENCOUNTER — Other Ambulatory Visit (INDEPENDENT_AMBULATORY_CARE_PROVIDER_SITE_OTHER): Payer: Medicare Other | Admitting: *Deleted

## 2011-06-23 DIAGNOSIS — R002 Palpitations: Secondary | ICD-10-CM

## 2011-06-23 DIAGNOSIS — R079 Chest pain, unspecified: Secondary | ICD-10-CM

## 2011-07-05 ENCOUNTER — Encounter: Payer: Self-pay | Admitting: *Deleted

## 2011-07-11 ENCOUNTER — Encounter: Payer: Self-pay | Admitting: Cardiology

## 2011-07-11 ENCOUNTER — Other Ambulatory Visit: Payer: Self-pay | Admitting: Cardiology

## 2011-07-11 ENCOUNTER — Telehealth: Payer: Self-pay | Admitting: *Deleted

## 2011-07-11 ENCOUNTER — Ambulatory Visit (INDEPENDENT_AMBULATORY_CARE_PROVIDER_SITE_OTHER): Payer: Medicare Other | Admitting: Cardiology

## 2011-07-11 ENCOUNTER — Encounter: Payer: Self-pay | Admitting: *Deleted

## 2011-07-11 VITALS — BP 117/77 | HR 62 | Resp 21 | Ht 70.0 in | Wt 211.4 lb

## 2011-07-11 DIAGNOSIS — I5189 Other ill-defined heart diseases: Secondary | ICD-10-CM | POA: Insufficient documentation

## 2011-07-11 DIAGNOSIS — I340 Nonrheumatic mitral (valve) insufficiency: Secondary | ICD-10-CM

## 2011-07-11 DIAGNOSIS — I359 Nonrheumatic aortic valve disorder, unspecified: Secondary | ICD-10-CM

## 2011-07-11 DIAGNOSIS — R0602 Shortness of breath: Secondary | ICD-10-CM

## 2011-07-11 DIAGNOSIS — R079 Chest pain, unspecified: Secondary | ICD-10-CM

## 2011-07-11 DIAGNOSIS — R222 Localized swelling, mass and lump, trunk: Secondary | ICD-10-CM

## 2011-07-11 DIAGNOSIS — I059 Rheumatic mitral valve disease, unspecified: Secondary | ICD-10-CM

## 2011-07-11 DIAGNOSIS — I38 Endocarditis, valve unspecified: Secondary | ICD-10-CM

## 2011-07-11 DIAGNOSIS — J984 Other disorders of lung: Secondary | ICD-10-CM

## 2011-07-11 NOTE — Assessment & Plan Note (Signed)
Possible cardiac mass noted in the right atrium I stress echocardiogram. However no mention made in formal echocardiogram. Nevertheless this will be evaluated by transesophageal echocardiogram

## 2011-07-11 NOTE — Telephone Encounter (Signed)
No precert required 

## 2011-07-11 NOTE — Assessment & Plan Note (Signed)
Stress echocardiogram was within normal limits and shows no evidence of ischemia.

## 2011-07-11 NOTE — Assessment & Plan Note (Signed)
Although there is moderate tricuspid regurgitation no evidence of significant pulmonary hypertension. As outlined below we will proceed with a transesophageal echocardiogram. The patient has agreed to the procedures and risks and benefits were explained.

## 2011-07-11 NOTE — Patient Instructions (Addendum)
   TEE - Thursday morning Your physician wants you to follow up in: 6 months.  You will receive a reminder letter in the mail one-two months in advance.  If you don't receive a letter, please call our office to schedule the follow up appointment

## 2011-07-11 NOTE — Assessment & Plan Note (Signed)
Followed by the patient's primary care physician 

## 2011-07-11 NOTE — Telephone Encounter (Signed)
TEE Scheduled for 07-14-2011 @ [facility] Checking percert

## 2011-07-11 NOTE — Assessment & Plan Note (Signed)
Although I cannot hear a significant murmur on physical examination, recent echocardiogram suggests moderate mitral regurgitation, possibly severe based on PISA calculation although the jet is eccentric. Particularly in light of the patient's symptoms of increased shortness of breath we'll further evaluate with a transesophageal echocardiogram.

## 2011-07-11 NOTE — Progress Notes (Signed)
HPI The patient is a 77 year old female with a history of neurocardiogenic syncope, normal LV function and prior mild mitral regurgitation as well as moderate pulmonary hypertension. During the last office visit she complained of some left-sided chest pain slightly worse with exertion but also worsened at rest. She was referred for stress echocardiogram. This showed no ischemia however there was mention made of possible mass in the right atrium. Therefore a dedicated echocardiogram was ordered. Interpretation by Dr. Diona Browner suggested that there was moderate mitral regurgitation with a Pisa calculation suggests possibly severe mitral regurgitation. The left atrium is also mildly dilated and there was moderate tricuspid regurgitation but no significant pulmonary hypertension. The patient now presents for followup to discuss the possibility of a transesophageal echocardiogram. She states that she remains short of breath both at rest on minimal exertion she has occasional left-sided chest pain but this is very localized to the left sternal border. This is not worsened by exertion. Of note is that there was no mention made of a mass in the right atrium is previously suspected on the stress echocardiogram. The patient also had some spinal problems and had an MRI done which showed multiple hemangiomas but no spinal stenosis or nerve root encroachment.  Allergies  Allergen Reactions  . Cephalexin   . Codeine   . Indomethacin   . Levofloxacin   . Morphine   . Penicillins   . Sulfamethoxazole W/Trimethoprim     Current Outpatient Prescriptions on File Prior to Visit  Medication Sig Dispense Refill  . B Complex-Folic Acid (SUPER B COMPLEX MAXI) TABS Take 1 tablet by mouth daily.        . calcium carbonate (OS-CAL) 600 MG TABS Take 600 mg by mouth daily.        . cefUROXime (CEFTIN) 250 MG tablet Take 250 mg by mouth 2 (two) times daily.        . Cranberry Juice Extract 1000 MG CAPS Take 2 capsules by mouth  daily.        Marland Kitchen dexlansoprazole (DEXILANT) 60 MG capsule Take 60 mg by mouth every evening.       . docusate sodium (COLACE) 100 MG capsule Take 100 mg by mouth daily.        Marland Kitchen FLUDROCORTISONE ACETATE PO Take 1 tablet by mouth. Take 1 0.1mg  tablet every M, W, Fr.       . fluticasone (FLONASE) 50 MCG/ACT nasal spray Place 2 sprays into the nose as needed.        . folic acid (FOLVITE) 800 MCG tablet Take 800 mcg by mouth daily.        . Glucosamine-Chondroitin-MSM (TRIPLE FLEX) 500-400-125 MG TABS Take 1 tablet by mouth daily.        Marland Kitchen levothyroxine (SYNTHROID) 50 MCG tablet Take 50 mcg by mouth daily.        . Lidocaine-Hydrocortisone Ace 3-0.5 % CREA Apply topically as needed.        . loratadine (CLARITIN) 10 MG tablet Take 10 mg by mouth daily.        . montelukast (SINGULAIR) 10 MG tablet Take 10 mg by mouth daily.        . Multiple Vitamins-Minerals ([employer]S SPECTRAVITE SENIOR) TABS Take 1 tablet by mouth daily.        . predniSONE (DELTASONE) 20 MG tablet Take 20 mg by mouth daily.        . Probiotic Product (ALIGN PO) Take by mouth daily.        Marland Kitchen  solifenacin (VESICARE) 10 MG tablet Take 10 mg by mouth daily.        Marland Kitchen topiramate (TOPAMAX) 25 MG tablet Take 50 mg by mouth 2 (two) times daily.        . vitamin C (ASCORBIC ACID) 500 MG tablet Take 500 mg by mouth daily.          Past Medical History  Diagnosis Date  . Syncope and collapse     Neurocardiogenic syncope with positive tilt table test on Florinef  . Allergy to radiographic dye   . Diverticulosis of colon (without mention of hemorrhage)   . Pruritus ani   . Irritable bowel syndrome   . GERD (gastroesophageal reflux disease)   . Peripheral polyneuropathy     confirmed by EMG and nerve conduction velocities   . Migraine     syncope with recurrence. postive table-tilt test on Florinef. reuled out pulmonary embolism in 2007. no evidence of structural heart disease.  . Pulmonary nodule   . Mild mitral regurgitation by prior  echocardiogram     Past Surgical History  Procedure Date  . Wrist surgery   . Foot surgery   . Total abdominal hysterectomy   . Appendectomy   . Back surgery   . Back pain     going to chiropracator for back and hip pain    Family History  Problem Relation Age of Onset  . Colon cancer Neg Hx     History   Social History  . Marital Status: Widowed    Spouse Name: N/A    Number of Children: N/A  . Years of Education: N/A   Occupational History  . Not on file.   Social History Main Topics  . Smoking status: Never Smoker   . Smokeless tobacco: Never Used   Comment: tobacco use - no  . Alcohol Use: No  . Drug Use: Yes  . Sexually Active: Not on file   Other Topics Concern  . Not on file   Social History Narrative   Retired, widowed, gets regular exercise.     [id number] positives as outlined above. The remainder of the 18  point review of systems is negative  PHYSICAL EXAM BP 117/77  Pulse 62  Resp 21  Ht 5\' 10"  (1.778 m)  Wt 211 lb 6.4 oz (95.89 kg)  BMI 30.33 kg/m2  SpO2 92%  General: Well-developed, well-nourished in no distress Head: Normocephalic and atraumatic Eyes:PERRLA/EOMI intact, conjunctiva and lids normal Ears: No deformity or lesions Mouth:normal dentition, normal posterior pharynx Neck: Supple, no JVD.  No masses, thyromegaly or abnormal cervical nodes Lungs: Normal breath sounds bilaterally without wheezing.  Normal percussion Cardiac: regular rate and rhythm with normal S1 and S2, no S3 or S4.  PMI is normal.  No pathological murmurs. I could not hear a murmur of mitral regurgitation. Abdomen: Normal bowel sounds, abdomen is soft and nontender without masses, organomegaly or hernias noted.  No hepatosplenomegaly MSK: Back normal, normal gait muscle strength and tone normal Vascular: Pulse is normal in all 4 extremities Extremities: No peripheral pitting edema Neurologic: Alert and oriented x 3 Skin: Intact without lesions or  rashes Lymphatics: No significant adenopathy Psychologic: Normal affect   ECG: Not available.  ASSESSMENT AND PLAN

## 2011-07-14 DIAGNOSIS — I059 Rheumatic mitral valve disease, unspecified: Secondary | ICD-10-CM

## 2011-09-14 ENCOUNTER — Other Ambulatory Visit: Payer: Self-pay | Admitting: *Deleted

## 2011-09-14 ENCOUNTER — Other Ambulatory Visit: Payer: Self-pay

## 2011-09-14 MED ORDER — DEXLANSOPRAZOLE 60 MG PO CPDR: 60.0000 mg | DELAYED_RELEASE_CAPSULE | Freq: Every day | ORAL | Status: DC

## 2011-09-14 MED ORDER — FLUDROCORTISONE ACETATE 0.1 MG PO TABS: 0.1000 mg | ORAL_TABLET | ORAL | Status: AC

## 2011-09-30 ENCOUNTER — Encounter: Payer: Self-pay | Admitting: Internal Medicine

## 2011-10-10 ENCOUNTER — Ambulatory Visit: Payer: Medicare Other | Admitting: Gastroenterology

## 2011-10-10 ENCOUNTER — Telehealth: Payer: Self-pay | Admitting: Internal Medicine

## 2011-10-10 MED ORDER — DEXLANSOPRAZOLE 60 MG PO CPDR: 60.0000 mg | DELAYED_RELEASE_CAPSULE | Freq: Every day | ORAL | Status: DC

## 2011-10-10 NOTE — Telephone Encounter (Signed)
Pt called this afternoon. She has 4-5 Dexilant capsules left and needs for [place] to fax a refill request to [facility] for a 3 month supply. She gave me the fax [phone].  She has OV with RMR on Nov 15, but may need a few samples to hold her over until them. Any questions you can reach her at [phone]

## 2011-10-10 NOTE — Telephone Encounter (Signed)
Routed to refill box 

## 2011-10-10 NOTE — Telephone Encounter (Signed)
We do not have any Dexilant samples currently. She can check back next week. See RX.

## 2011-11-03 ENCOUNTER — Ambulatory Visit: Payer: Medicare Other | Admitting: Internal Medicine

## 2011-11-07 ENCOUNTER — Ambulatory Visit (INDEPENDENT_AMBULATORY_CARE_PROVIDER_SITE_OTHER): Payer: Medicare Other | Admitting: Internal Medicine

## 2011-11-07 ENCOUNTER — Encounter: Payer: Self-pay | Admitting: Internal Medicine

## 2011-11-07 VITALS — BP 120/70 | HR 78 | Temp 97.4°F | Ht 70.0 in | Wt 210.4 lb

## 2011-11-07 DIAGNOSIS — K219 Gastro-esophageal reflux disease without esophagitis: Secondary | ICD-10-CM

## 2011-11-07 NOTE — Patient Instructions (Signed)
Continue Dexilant daily  Use Miralax nightly as needed  See Dr. Roxy Manns in reference to hoarseness  Office visit March 2013  Colonoscopy in 2016

## 2011-11-09 NOTE — Progress Notes (Signed)
Primary Care Physician:  Estanislado Pandy, MD Primary Gastroenterologist:  Dr.   Pre-Procedure History & Physical: HPI:  [name] is a 77 y.o. female here for followup of GERD. Has had chronic hoarseness and productive cough intermittently. Typical reflux symptoms well controlled on current PPI regimen. No dysphagia. Has not seen Dr. Carmie Kanner, her ENT for hoarseness for quite a while. She has seen him for a some problems with her years more recently. She has not been able to accomplish significant weight loss he remains significantly these. No no melena. Last colonoscopy 2006-diverticulosis. The routine screening 2016.  Past Medical History  Diagnosis Date  . Syncope and collapse     Neurocardiogenic syncope with positive tilt table test on Florinef  . Allergy to radiographic dye 06/13/2005    tcs by Dr. Jena Gauss  . Diverticulosis of colon (without mention of hemorrhage)   . Pruritus ani   . Irritable bowel syndrome   . GERD (gastroesophageal reflux disease)   . Peripheral polyneuropathy     confirmed by EMG and nerve conduction velocities   . Migraine     syncope with recurrence. postive table-tilt test on Florinef. reuled out pulmonary embolism in 2007. no evidence of structural heart disease.  . Pulmonary nodule   . Mild mitral regurgitation by prior echocardiogram   . Hemorrhoids, internal 06/16/2005    tcs by Dr. Jena Gauss    Past Surgical History  Procedure Date  . Wrist surgery   . Foot surgery   . Total abdominal hysterectomy   . Appendectomy   . Back surgery   . Back pain     going to chiropracator for back and hip pain  . Colonoscopy 06/16/2005    internal hemorrhoids, L side diverticula    Prior to Admission medications   Medication Sig Start Date End Date Taking? Authorizing Provider  calcium carbonate (OS-CAL) 600 MG TABS Take 600 mg by mouth daily.     Yes Historical Provider, MD  Cranberry Juice Extract 1000 MG CAPS Take 2 capsules by mouth daily.     Yes Historical  Provider, MD  dexlansoprazole (DEXILANT) 60 MG capsule Take 1 capsule (60 mg total) by mouth daily. 10/10/11  Yes Lorenza Burton, NP  docusate sodium (COLACE) 100 MG capsule Take 100 mg by mouth daily.     Yes Historical Provider, MD  fludrocortisone (FLORINEF) 0.1 MG tablet Take 1 tablet (0.1 mg total) by mouth 3 (three) times a week. On M-W-F only 09/14/11 09/13/12 Yes Peyton Bottoms, MD  fluticasone [facility]) 50 MCG/ACT nasal spray Place 2 sprays into the nose as needed.     Yes Historical Provider, MD  Glucosamine-Chondroitin-MSM (TRIPLE FLEX) 500-400-125 MG TABS Take 1 tablet by mouth daily.     Yes Historical Provider, MD  levothyroxine (SYNTHROID) 50 MCG tablet Take 75 mcg by mouth daily.    Yes Historical Provider, MD  Lidocaine-Hydrocortisone Ace 3-0.5 % CREA Apply topically as needed.     Yes Historical Provider, MD  loratadine (CLARITIN) 10 MG tablet Take 10 mg by mouth daily.     Yes Historical Provider, MD  montelukast (SINGULAIR) 10 MG tablet Take 10 mg by mouth daily.     Yes Historical Provider, MD  Multiple Vitamins-Minerals (CVS SPECTRAVITE SENIOR) TABS Take 1 tablet by mouth daily.     Yes Historical Provider, MD  predniSONE (DELTASONE) 20 MG tablet Take 20 mg by mouth daily.     Yes Historical Provider, MD  Probiotic Product (ALIGN PO) Take by  mouth daily.     Yes Historical Provider, MD  solifenacin (VESICARE) 10 MG tablet Take 10 mg by mouth daily.     Yes Historical Provider, MD  topiramate (TOPAMAX) 25 MG tablet Take 50 mg by mouth 2 (two) times daily.     Yes Historical Provider, MD  vitamin C (ASCORBIC ACID) 500 MG tablet Take 500 mg by mouth daily.     Yes Historical Provider, MD  B Complex-Folic Acid (SUPER B COMPLEX MAXI) TABS Take 1 tablet by mouth daily.      Historical Provider, MD  cefUROXime (CEFTIN) 250 MG tablet Take 250 mg by mouth 2 (two) times daily.     Historical Provider, MD  folic acid (FOLVITE) 800 MCG tablet Take 800 mcg by mouth daily.      Historical  Provider, MD    Allergies as of 11/07/2011 - Review Complete 11/07/2011  Allergen Reaction Noted  . Cephalexin  02/25/2009  . Codeine  02/25/2009  . Indomethacin  02/25/2009  . Levofloxacin  02/25/2009  . Morphine  02/25/2009  . Sulfamethoxazole w/trimethoprim    . Penicillins Rash 02/25/2009    Family History  Problem Relation Age of Onset  . Colon cancer Neg Hx     History   Social History  . Marital Status: Widowed    Spouse Name: N/A    Number of Children: N/A  . Years of Education: N/A   Occupational History  . Not on file.   Social History Main Topics  . Smoking status: Never Smoker   . Smokeless tobacco: Never Used   Comment: tobacco use - no  . Alcohol Use: No  . Drug Use: Yes  . Sexually Active: Not on file   Other Topics Concern  . Not on file   Social History Narrative   Retired, widowed, gets regular exercise.     Review of Systems: See HPI, otherwise negative ROS  Physical Exam: BP 120/70  Pulse 78  Temp(Src) 97.4 F (36.3 C) (Temporal)  Ht 5\' 10"  (1.778 m)  Wt 210 lb 6.4 oz (95.437 kg)  BMI 30.19 kg/m2 General:   Alert,  Well-developed, well-nourished, pleasant and cooperative in NAD Head:  Normocephalic and atraumatic. ENeck:  Supple; no masses or thyromegaly. Lungs:  Clear throughout to auscultation.   No wheezes, crackles, or rhonchi. No acute distress. Heart:  Regular rate and rhythm; no murmurs, clicks, rubs,  or gallops. Abdomen:  Obese. Positive bowel sounds, soft nontender without appreciable mass or organomegalyExtremities

## 2011-11-09 NOTE — Assessment & Plan Note (Signed)
Typical GERD symptoms well controlled on her current PPI regimen. She has a productive cough and intermittent hoarseness which may or may not have anything to do with reflux. She really needs to see her ENT, Dr. Roxy Manns in [place] for further evaluation.  Recommendations:   Continue current PPI regimen.  ENT evaluation as recommended.  Followup 2013.  Routine screening colonoscopy 2016.

## 2011-11-15 NOTE — Progress Notes (Signed)
Cc to PCP 

## 2011-12-26 ENCOUNTER — Encounter: Payer: Self-pay | Admitting: Cardiology

## 2011-12-26 ENCOUNTER — Ambulatory Visit (INDEPENDENT_AMBULATORY_CARE_PROVIDER_SITE_OTHER): Payer: Medicare Other | Admitting: Cardiology

## 2011-12-26 VITALS — BP 138/80 | HR 71 | Ht 69.0 in | Wt 210.1 lb

## 2011-12-26 DIAGNOSIS — R55 Syncope and collapse: Secondary | ICD-10-CM | POA: Diagnosis not present

## 2011-12-26 MED ORDER — NITROGLYCERIN 0.4 MG SL SUBL: 0.4000 mg | SUBLINGUAL_TABLET | SUBLINGUAL | Status: DC | PRN

## 2011-12-26 NOTE — Progress Notes (Signed)
Diana Bottoms, MD, [facility] Board Certified in Adult Cardiovascular Medicine,Internal Medicine and Critical Care Medicine    CC: Followup patient with mitral regurgitation.  HPI:  Patient is an elderly female who had a prior workup for her neck mass. TEE showed no significant abnormalities. She does have moderate mitral regurgitation but is essentially asymptomatic. She has only mildly decreased exercise tolerance. She is able to do her activities of daily living without any difficulty. She is able to vacuum her house without any shortness of breath. She denies any chest pain. She does report some hoarseness and cough which does not appear to be related to any medications. She does report occasional chest pain but this is usually in the evening when laying down and typically last for 45 minutes. She has never tried nitroglycerin and her prescription has expired. She reports no chest pain on exertion    PMH: reviewed and listed in Problem List in Electronic Records (and see below) Past Medical History  Diagnosis Date  . Syncope and collapse     Neurocardiogenic syncope with positive tilt table test on Florinef  . Allergy to radiographic dye 06/13/2005    tcs by Dr. Jena Gauss  . Diverticulosis of colon (without mention of hemorrhage)   . Pruritus ani   . Irritable bowel syndrome   . GERD (gastroesophageal reflux disease)   . Peripheral polyneuropathy     confirmed by EMG and nerve conduction velocities   . Migraine     syncope with recurrence. postive table-tilt test on Florinef. reuled out pulmonary embolism in 2007. no evidence of structural heart disease.  . Pulmonary nodule   . Mild mitral regurgitation by prior echocardiogram   . Hemorrhoids, internal 06/16/2005    tcs by Dr. Jena Gauss   Past Surgical History  Procedure Date  . Wrist surgery   . Foot surgery   . Total abdominal hysterectomy   . Appendectomy   . Back surgery   . Back pain     going to chiropracator for back and  hip pain  . Colonoscopy 06/16/2005    internal hemorrhoids, L side diverticula    Allergies/SH/FHX : available in Electronic Records for review  Allergies  Allergen Reactions  . Cephalexin   . Codeine   . Indomethacin   . Levofloxacin   . Morphine   . Sulfamethoxazole W/Trimethoprim   . Penicillins Rash   History   Social History  . Marital Status: Widowed    Spouse Name: N/A    Number of Children: N/A  . Years of Education: N/A   Occupational History  . Not on file.   Social History Main Topics  . Smoking status: Never Smoker   . Smokeless tobacco: Never Used   Comment: tobacco use - no  . Alcohol Use: No  . Drug Use: Yes  . Sexually Active: Not on file   Other Topics Concern  . Not on file   Social History Narrative   Retired, widowed, gets regular exercise.    Family History  Problem Relation Age of Onset  . Colon cancer Neg Hx     Medications: Current Outpatient Prescriptions  Medication Sig Dispense Refill  . calcium carbonate (OS-CAL) 600 MG TABS Take 600 mg by mouth daily.        . cefUROXime (CEFTIN) 250 MG tablet Take 250 mg by mouth at bedtime.       . Cranberry Juice Extract 1000 MG CAPS Take 2 capsules by mouth daily.        Marland Kitchen  dexlansoprazole (DEXILANT) 60 MG capsule Take 1 capsule (60 mg total) by mouth daily.  90 capsule  3  . docusate sodium (COLACE) 100 MG capsule Take 100 mg by mouth. Takes 3 x week      . fludrocortisone (FLORINEF) 0.1 MG tablet Take 1 tablet (0.1 mg total) by mouth 3 (three) times a week. On M-W-F only  45 tablet  3  . fluticasone (FLONASE) 50 MCG/ACT nasal spray Place 2 sprays into the nose as needed.        Marland Kitchen levothyroxine (SYNTHROID) 50 MCG tablet Take 75 mcg by mouth daily.       . Lidocaine-Hydrocortisone Ace 3-0.5 % CREA Apply topically as needed.        . loratadine (CLARITIN) 10 MG tablet Take 10 mg by mouth daily.        . montelukast (SINGULAIR) 10 MG tablet Take 10 mg by mouth daily.        . Multiple  Vitamins-Minerals (CVS SPECTRAVITE SENIOR) TABS Take 1 tablet by mouth daily.        . Probiotic Product (ALIGN PO) Take by mouth daily.        . solifenacin (VESICARE) 10 MG tablet Take 10 mg by mouth daily.        Marland Kitchen topiramate (TOPAMAX) 50 MG tablet Take 50 mg by mouth 2 (two) times daily.        . vitamin C (ASCORBIC ACID) 500 MG tablet Take 500 mg by mouth daily.        . nitroGLYCERIN (NITROSTAT) 0.4 MG SL tablet Place 1 tablet (0.4 mg total) under the tongue every 5 (five) minutes as needed.  25 tablet  3    ROS: No nausea or vomiting. No fever or chills.No melena or hematochezia.No bleeding.No claudication  Physical Exam: BP 138/80  Pulse 71  Ht 5\' 9"  (1.753 m)  Wt 210 lb 1.9 oz (95.31 kg)  BMI 31.03 kg/m2 General: Well-nourished white female in no distress Neck: Normal carotid upstroke no carotid bruits. No thyromegaly nonnodular thyroid Lungs: Clear breath sounds bilaterally. No wheezing Cardiac: Regular rate and rhythm with normal S1-S2 Vascular: No edema. Normal distal pulses Skin: Warm and dry Physcologic: Normal affect  12lead ECG: Limited bedside ECHO:N/A   Patient Active Problem List  Diagnoses  . BENIGN POSITIONAL VERTIGO  . HYPERTENSION, PULMONARY  . VALVULAR HEART DISEASE  . PULMONARY NODULE  . GERD  . OTHER SPECIFIED DISORDER OF THE ESOPHAGUS  . HIATAL HERNIA WITH REFLUX  . DIVERTICULOSIS OF COLON  . IRRITABLE BOWEL SYNDROME  . PRURITUS ANI  . Syncope and collapse  . OTHER SYMPTOMS INVOLVING DIGESTIVE SYSTEM OTHER  . ABDOMINAL PAIN  . CONTRAST DYE ALLERGY  . Mild mitral regurgitation by prior echocardiogram  . Chest pain  . Cardiac mass    PLAN   The patient is a well from a cardiac perspective. No further testing is needed.  She has a history of neurocardiogenic syncope with positive tilt table test. She is doing well on Florinef.  She has some lower extremity edema which is secondary to varicose veins and I do not think we need to cut back  on her Florinef.  Asked her to discuss the etiology of her cough with her primary care physician but it sounds it may be secondary to postnasal drip

## 2011-12-26 NOTE — Patient Instructions (Signed)
Your physician wants you to follow-up in: 6 months. You will receive a reminder letter in the mail one-two months in advance. If you don't receive a letter, please call our office to schedule the follow-up appointment. Your physician recommends that you continue on your current medications as directed. Please refer to the Current Medication list given to you today. Your physician has requested that you have an echocardiogram. Echocardiography is a painless test that uses sound waves to create images of your heart. It provides your doctor with information about the size and shape of your heart and how well your heart's chambers and valves are working. This procedure takes approximately one hour. There are no restrictions for this procedure. ECHO DUE 06/2012

## 2011-12-28 ENCOUNTER — Ambulatory Visit: Payer: Medicare Other | Admitting: Cardiology

## 2011-12-30 DIAGNOSIS — R350 Frequency of micturition: Secondary | ICD-10-CM | POA: Diagnosis not present

## 2011-12-30 DIAGNOSIS — R351 Nocturia: Secondary | ICD-10-CM | POA: Diagnosis not present

## 2011-12-30 DIAGNOSIS — N390 Urinary tract infection, site not specified: Secondary | ICD-10-CM | POA: Diagnosis not present

## 2012-01-09 DIAGNOSIS — R110 Nausea: Secondary | ICD-10-CM | POA: Diagnosis not present

## 2012-01-09 DIAGNOSIS — R220 Localized swelling, mass and lump, head: Secondary | ICD-10-CM | POA: Diagnosis not present

## 2012-01-09 DIAGNOSIS — S022XXA Fracture of nasal bones, initial encounter for closed fracture: Secondary | ICD-10-CM | POA: Diagnosis not present

## 2012-01-09 DIAGNOSIS — S0083XA Contusion of other part of head, initial encounter: Secondary | ICD-10-CM | POA: Diagnosis not present

## 2012-01-09 DIAGNOSIS — S1093XA Contusion of unspecified part of neck, initial encounter: Secondary | ICD-10-CM | POA: Diagnosis not present

## 2012-01-09 DIAGNOSIS — R079 Chest pain, unspecified: Secondary | ICD-10-CM | POA: Diagnosis not present

## 2012-01-09 DIAGNOSIS — K449 Diaphragmatic hernia without obstruction or gangrene: Secondary | ICD-10-CM | POA: Diagnosis not present

## 2012-01-09 DIAGNOSIS — R51 Headache: Secondary | ICD-10-CM | POA: Diagnosis not present

## 2012-01-09 DIAGNOSIS — S40019A Contusion of unspecified shoulder, initial encounter: Secondary | ICD-10-CM | POA: Diagnosis not present

## 2012-01-09 DIAGNOSIS — S0003XA Contusion of scalp, initial encounter: Secondary | ICD-10-CM | POA: Diagnosis not present

## 2012-01-09 DIAGNOSIS — Z79899 Other long term (current) drug therapy: Secondary | ICD-10-CM | POA: Diagnosis not present

## 2012-01-09 DIAGNOSIS — M5030 Other cervical disc degeneration, unspecified cervical region: Secondary | ICD-10-CM | POA: Diagnosis not present

## 2012-01-09 DIAGNOSIS — R109 Unspecified abdominal pain: Secondary | ICD-10-CM | POA: Diagnosis not present

## 2012-01-09 DIAGNOSIS — R42 Dizziness and giddiness: Secondary | ICD-10-CM | POA: Diagnosis not present

## 2012-01-10 DIAGNOSIS — R51 Headache: Secondary | ICD-10-CM | POA: Diagnosis not present

## 2012-01-10 DIAGNOSIS — Z79899 Other long term (current) drug therapy: Secondary | ICD-10-CM | POA: Diagnosis not present

## 2012-01-10 DIAGNOSIS — R110 Nausea: Secondary | ICD-10-CM | POA: Diagnosis not present

## 2012-01-10 DIAGNOSIS — R1013 Epigastric pain: Secondary | ICD-10-CM | POA: Diagnosis not present

## 2012-01-10 DIAGNOSIS — R109 Unspecified abdominal pain: Secondary | ICD-10-CM | POA: Diagnosis not present

## 2012-01-10 DIAGNOSIS — R42 Dizziness and giddiness: Secondary | ICD-10-CM | POA: Diagnosis not present

## 2012-01-18 DIAGNOSIS — S022XXA Fracture of nasal bones, initial encounter for closed fracture: Secondary | ICD-10-CM | POA: Diagnosis not present

## 2012-01-24 DIAGNOSIS — H0230 Blepharochalasis unspecified eye, unspecified eyelid: Secondary | ICD-10-CM | POA: Diagnosis not present

## 2012-01-24 DIAGNOSIS — Z9889 Other specified postprocedural states: Secondary | ICD-10-CM | POA: Diagnosis not present

## 2012-02-01 DIAGNOSIS — L609 Nail disorder, unspecified: Secondary | ICD-10-CM | POA: Diagnosis not present

## 2012-02-01 DIAGNOSIS — L851 Acquired keratosis [keratoderma] palmaris et plantaris: Secondary | ICD-10-CM | POA: Diagnosis not present

## 2012-02-01 DIAGNOSIS — I70209 Unspecified atherosclerosis of native arteries of extremities, unspecified extremity: Secondary | ICD-10-CM | POA: Diagnosis not present

## 2012-02-13 DIAGNOSIS — J342 Deviated nasal septum: Secondary | ICD-10-CM | POA: Diagnosis not present

## 2012-02-13 DIAGNOSIS — S022XXA Fracture of nasal bones, initial encounter for closed fracture: Secondary | ICD-10-CM | POA: Diagnosis not present

## 2012-02-20 DIAGNOSIS — Z79899 Other long term (current) drug therapy: Secondary | ICD-10-CM | POA: Diagnosis not present

## 2012-02-20 DIAGNOSIS — E669 Obesity, unspecified: Secondary | ICD-10-CM | POA: Diagnosis not present

## 2012-02-20 DIAGNOSIS — R5381 Other malaise: Secondary | ICD-10-CM | POA: Diagnosis not present

## 2012-02-20 DIAGNOSIS — E039 Hypothyroidism, unspecified: Secondary | ICD-10-CM | POA: Diagnosis not present

## 2012-02-23 DIAGNOSIS — R269 Unspecified abnormalities of gait and mobility: Secondary | ICD-10-CM | POA: Diagnosis not present

## 2012-02-23 DIAGNOSIS — G63 Polyneuropathy in diseases classified elsewhere: Secondary | ICD-10-CM | POA: Diagnosis not present

## 2012-02-28 DIAGNOSIS — R5383 Other fatigue: Secondary | ICD-10-CM | POA: Diagnosis not present

## 2012-02-28 DIAGNOSIS — M545 Low back pain, unspecified: Secondary | ICD-10-CM | POA: Diagnosis not present

## 2012-02-28 DIAGNOSIS — G43919 Migraine, unspecified, intractable, without status migrainosus: Secondary | ICD-10-CM | POA: Diagnosis not present

## 2012-02-28 DIAGNOSIS — E039 Hypothyroidism, unspecified: Secondary | ICD-10-CM | POA: Diagnosis not present

## 2012-02-28 DIAGNOSIS — R300 Dysuria: Secondary | ICD-10-CM | POA: Diagnosis not present

## 2012-02-28 DIAGNOSIS — R5381 Other malaise: Secondary | ICD-10-CM | POA: Diagnosis not present

## 2012-02-28 DIAGNOSIS — G619 Inflammatory polyneuropathy, unspecified: Secondary | ICD-10-CM | POA: Diagnosis not present

## 2012-02-28 DIAGNOSIS — K2100 Gastro-esophageal reflux disease with esophagitis, without bleeding: Secondary | ICD-10-CM | POA: Diagnosis not present

## 2012-02-28 DIAGNOSIS — N3000 Acute cystitis without hematuria: Secondary | ICD-10-CM | POA: Diagnosis not present

## 2012-03-06 DIAGNOSIS — L570 Actinic keratosis: Secondary | ICD-10-CM | POA: Diagnosis not present

## 2012-03-06 DIAGNOSIS — L280 Lichen simplex chronicus: Secondary | ICD-10-CM | POA: Diagnosis not present

## 2012-03-06 DIAGNOSIS — Z85828 Personal history of other malignant neoplasm of skin: Secondary | ICD-10-CM | POA: Diagnosis not present

## 2012-03-12 ENCOUNTER — Encounter: Payer: Self-pay | Admitting: Gastroenterology

## 2012-03-29 ENCOUNTER — Encounter: Payer: Self-pay | Admitting: Internal Medicine

## 2012-03-29 DIAGNOSIS — R350 Frequency of micturition: Secondary | ICD-10-CM | POA: Diagnosis not present

## 2012-03-29 DIAGNOSIS — N318 Other neuromuscular dysfunction of bladder: Secondary | ICD-10-CM | POA: Diagnosis not present

## 2012-04-04 DIAGNOSIS — L723 Sebaceous cyst: Secondary | ICD-10-CM | POA: Diagnosis not present

## 2012-04-04 DIAGNOSIS — L570 Actinic keratosis: Secondary | ICD-10-CM | POA: Diagnosis not present

## 2012-04-04 DIAGNOSIS — D1800 Hemangioma unspecified site: Secondary | ICD-10-CM | POA: Diagnosis not present

## 2012-04-10 ENCOUNTER — Encounter: Payer: Self-pay | Admitting: Internal Medicine

## 2012-04-10 ENCOUNTER — Ambulatory Visit (INDEPENDENT_AMBULATORY_CARE_PROVIDER_SITE_OTHER): Payer: Medicare Other | Admitting: Internal Medicine

## 2012-04-10 DIAGNOSIS — K219 Gastro-esophageal reflux disease without esophagitis: Secondary | ICD-10-CM | POA: Diagnosis not present

## 2012-04-10 DIAGNOSIS — K5900 Constipation, unspecified: Secondary | ICD-10-CM | POA: Diagnosis not present

## 2012-04-10 NOTE — Progress Notes (Signed)
Primary Care Physician:  Estanislado Pandy, MD, MD Primary Gastroenterologist:  Dr. Jena Gauss  Pre-Procedure History & Physical: HPI:  [name] is a 78 y.o. female here for GERD. Since last fall. Reflux symptoms doing well on Dexilant 60 mg orally once daily. She is seeing her ENT physician tomorrow at [place] no dysphagia no melena some chronic constipation. She's due for average risk screening colonoscopy 2016. She's not had any rectal bleeding. She's going to [facility] for weight loss.  There is one to 3 days without a bowel movement. Cannot remember to take MiraLax on a regular basis. Her VESIcare has been reduced to one half the usual dose. Her fourth r great-grandchild is on the way.  Past Medical History  Diagnosis Date  . Syncope and collapse     Neurocardiogenic syncope with positive tilt table test on Florinef  . Allergy to radiographic dye 06/13/2005    tcs by Dr. Jena Gauss  . Diverticulosis of colon (without mention of hemorrhage)   . Pruritus ani   . Irritable bowel syndrome   . GERD (gastroesophageal reflux disease)   . Peripheral polyneuropathy     confirmed by EMG and nerve conduction velocities   . Migraine     syncope with recurrence. postive table-tilt test on Florinef. reuled out pulmonary embolism in 2007. no evidence of structural heart disease.  . Pulmonary nodule   . Mild mitral regurgitation by prior echocardiogram   . Hemorrhoids, internal 06/16/2005    tcs by Dr. Jena Gauss    Past Surgical History  Procedure Date  . Wrist surgery   . Foot surgery   . Total abdominal hysterectomy   . Appendectomy   . Back surgery   . Back pain     going to chiropracator for back and hip pain  . Colonoscopy 06/16/2005    internal hemorrhoids, L side diverticula - Dr. Jena Gauss  . Esophagogastroduodenoscopy 12/21/2006      Dr. Elmer Ramp esophagus/Patulous EG junction, small to moderate sized hiatal hernia,   multiple fundal gland type appearing polyps biopsied, otherwise normal     Prior to Admission medications   Medication Sig Start Date End Date Taking? Authorizing Provider  calcium carbonate (OS-CAL) 600 MG TABS Take 600 mg by mouth daily.     Yes Historical Provider, MD  Cranberry Juice Extract 1000 MG CAPS Take 2 capsules by mouth daily.     Yes Historical Provider, MD  dexlansoprazole (DEXILANT) 60 MG capsule Take 1 capsule (60 mg total) by mouth daily. 10/10/11  Yes Joselyn Arrow, NP  fludrocortisone (FLORINEF) 0.1 MG tablet Take 1 tablet (0.1 mg total) by mouth 3 (three) times a week. On M-W-F only 09/14/11 09/13/12 Yes June Leap, MD  fluticasone [facility]) 50 MCG/ACT nasal spray Place 2 sprays into the nose as needed.     Yes Historical Provider, MD  gabapentin (NEURONTIN) 300 MG capsule Take 100 mg by mouth 3 (three) times daily.   Yes Historical Provider, MD  levothyroxine (SYNTHROID) 50 MCG tablet Take 75 mcg by mouth daily.    Yes Historical Provider, MD  Lidocaine-Hydrocortisone Ace 3-0.5 % CREA Apply topically as needed.     Yes Historical Provider, MD  loratadine (CLARITIN) 10 MG tablet Take 10 mg by mouth daily.     Yes Historical Provider, MD  montelukast (SINGULAIR) 10 MG tablet Take 10 mg by mouth daily.     Yes Historical Provider, MD  Multiple Vitamins-Minerals ([employer] SPECTRAVITE SENIOR) TABS Take 1 tablet by mouth daily.  Yes Historical Provider, MD  nitroGLYCERIN (NITROSTAT) 0.4 MG SL tablet Place 1 tablet (0.4 mg total) under the tongue every 5 (five) minutes as needed. 12/26/11  Yes June Leap, MD  Probiotic Product (ALIGN PO) Take by mouth daily.     Yes Historical Provider, MD  solifenacin (VESICARE) 10 MG tablet Take 10 mg by mouth daily.     Yes Historical Provider, MD  topiramate (TOPAMAX) 50 MG tablet Take 50 mg by mouth 3 (three) times daily. 2 in the morning  1 at night    Yes Historical Provider, MD  vitamin C (ASCORBIC ACID) 500 MG tablet Take 500 mg by mouth daily.     Yes Historical Provider, MD  cefUROXime (CEFTIN) 250 MG  tablet Take 250 mg by mouth at bedtime.     Historical Provider, MD  docusate sodium (COLACE) 100 MG capsule Take 100 mg by mouth. Takes 3 x week    Historical Provider, MD    Allergies as of 04/10/2012 - Review Complete 04/10/2012  Allergen Reaction Noted  . Cephalexin  02/25/2009  . Codeine  02/25/2009  . Indomethacin  02/25/2009  . Levofloxacin  02/25/2009  . Morphine  02/25/2009  . Sulfamethoxazole w/trimethoprim    . Penicillins Rash 02/25/2009    Family History  Problem Relation Age of Onset  . Colon cancer Neg Hx     History   Social History  . Marital Status: Widowed    Spouse Name: N/A    Number of Children: N/A  . Years of Education: N/A   Occupational History  . Not on file.   Social History Main Topics  . Smoking status: Never Smoker   . Smokeless tobacco: Never Used   Comment: tobacco use - no  . Alcohol Use: No  . Drug Use: Yes  . Sexually Active: Not on file   Other Topics Concern  . Not on file   Social History Narrative   Retired, widowed, gets regular exercise.     Review of Systems: See HPI, otherwise negative ROS  Physical Exam: BP 122/74  Pulse 68  Temp(Src) 97.7 F (36.5 C) (Temporal)  Ht 5\' 10"  (1.778 m)  Wt 208 lb 9.6 oz (94.62 kg)  BMI 29.93 kg/m2 General:   Alert,  Well-developed, well-nourished, pleasant and cooperative in NAD Skin:  Intact without significant lesions or rashes. Eyes:  Sclera clear, no icterus.   Conjunctiva pink. Ears:  Normal auditory acuity. Nose:  No deformity, discharge,  or lesions. Mouth:  No deformity or lesions. Neck:  Supple; no masses or thyromegaly. No significant cervical adenopathy. Lungs:  Clear throughout to auscultation.   No wheezes, crackles, or rhonchi. No acute distress. Heart:  Regular rate and rhythm; no murmurs, clicks, rubs,  or gallops. Abdomen: Non-distended, normal bowel sounds.  Soft and nontender without appreciable mass or hepatosplenomegaly.  Pulses:  Normal pulses  noted. Extremities:  Without clubbing or edema.

## 2012-04-10 NOTE — Assessment & Plan Note (Signed)
GERD symptoms seemed  to settled down with Dexilant 60 mg orally daily. She has a history of ENT component. She is planning  to see her ENT physician in the near future. I recommended she continue on Dexilant 60 mg daily. I feel the benefits outweigh the risks.   Constipation likely multifactorial in etiology. I'm pleased to note she's able to back off on VESIcare. I recommended she take MiraLax one half to one capful daily and get into a routine to facilitate a reasonably good bowel movement once daily to every other day.   Plan office visit here in 12 months. Will have her return in one stool sample for fecal occult blood testing.

## 2012-04-10 NOTE — Patient Instructions (Signed)
Miralax 1/2 to 1 capful daily  Continue Dexilant 60 mg daily  Office visit  12 months  Colonoscopy 2016  One stool check for occult blood

## 2012-04-11 DIAGNOSIS — J3089 Other allergic rhinitis: Secondary | ICD-10-CM | POA: Diagnosis not present

## 2012-04-11 DIAGNOSIS — J301 Allergic rhinitis due to pollen: Secondary | ICD-10-CM | POA: Diagnosis not present

## 2012-04-11 DIAGNOSIS — J3481 Nasal mucositis (ulcerative): Secondary | ICD-10-CM | POA: Diagnosis not present

## 2012-04-11 DIAGNOSIS — H903 Sensorineural hearing loss, bilateral: Secondary | ICD-10-CM | POA: Diagnosis not present

## 2012-04-20 ENCOUNTER — Ambulatory Visit (INDEPENDENT_AMBULATORY_CARE_PROVIDER_SITE_OTHER): Payer: Medicare Other | Admitting: Internal Medicine

## 2012-04-20 DIAGNOSIS — K649 Unspecified hemorrhoids: Secondary | ICD-10-CM | POA: Diagnosis not present

## 2012-04-26 DIAGNOSIS — R350 Frequency of micturition: Secondary | ICD-10-CM | POA: Diagnosis not present

## 2012-04-26 DIAGNOSIS — N390 Urinary tract infection, site not specified: Secondary | ICD-10-CM | POA: Diagnosis not present

## 2012-04-27 NOTE — Progress Notes (Signed)
Hemoccult negative stool. Followup as planned previously

## 2012-04-30 ENCOUNTER — Other Ambulatory Visit: Payer: Self-pay | Admitting: Cardiology

## 2012-04-30 DIAGNOSIS — R55 Syncope and collapse: Secondary | ICD-10-CM

## 2012-05-02 DIAGNOSIS — L851 Acquired keratosis [keratoderma] palmaris et plantaris: Secondary | ICD-10-CM | POA: Diagnosis not present

## 2012-05-02 DIAGNOSIS — L609 Nail disorder, unspecified: Secondary | ICD-10-CM | POA: Diagnosis not present

## 2012-05-02 DIAGNOSIS — I70209 Unspecified atherosclerosis of native arteries of extremities, unspecified extremity: Secondary | ICD-10-CM | POA: Diagnosis not present

## 2012-05-10 DIAGNOSIS — N390 Urinary tract infection, site not specified: Secondary | ICD-10-CM | POA: Diagnosis not present

## 2012-05-10 DIAGNOSIS — R350 Frequency of micturition: Secondary | ICD-10-CM | POA: Diagnosis not present

## 2012-05-23 DIAGNOSIS — R269 Unspecified abnormalities of gait and mobility: Secondary | ICD-10-CM | POA: Diagnosis not present

## 2012-05-23 DIAGNOSIS — G40309 Generalized idiopathic epilepsy and epileptic syndromes, not intractable, without status epilepticus: Secondary | ICD-10-CM | POA: Diagnosis not present

## 2012-05-28 DIAGNOSIS — R350 Frequency of micturition: Secondary | ICD-10-CM | POA: Diagnosis not present

## 2012-05-28 DIAGNOSIS — N318 Other neuromuscular dysfunction of bladder: Secondary | ICD-10-CM | POA: Diagnosis not present

## 2012-06-18 DIAGNOSIS — N390 Urinary tract infection, site not specified: Secondary | ICD-10-CM | POA: Diagnosis not present

## 2012-06-26 DIAGNOSIS — K2100 Gastro-esophageal reflux disease with esophagitis, without bleeding: Secondary | ICD-10-CM | POA: Diagnosis not present

## 2012-06-26 DIAGNOSIS — H6120 Impacted cerumen, unspecified ear: Secondary | ICD-10-CM | POA: Diagnosis not present

## 2012-06-26 DIAGNOSIS — H903 Sensorineural hearing loss, bilateral: Secondary | ICD-10-CM | POA: Diagnosis not present

## 2012-06-27 ENCOUNTER — Other Ambulatory Visit: Payer: Self-pay

## 2012-06-27 ENCOUNTER — Other Ambulatory Visit: Payer: Medicare Other

## 2012-06-27 ENCOUNTER — Other Ambulatory Visit (INDEPENDENT_AMBULATORY_CARE_PROVIDER_SITE_OTHER): Payer: Medicare Other

## 2012-06-27 DIAGNOSIS — I059 Rheumatic mitral valve disease, unspecified: Secondary | ICD-10-CM

## 2012-06-27 DIAGNOSIS — R609 Edema, unspecified: Secondary | ICD-10-CM | POA: Diagnosis not present

## 2012-06-27 DIAGNOSIS — K2100 Gastro-esophageal reflux disease with esophagitis, without bleeding: Secondary | ICD-10-CM | POA: Diagnosis not present

## 2012-06-27 DIAGNOSIS — G619 Inflammatory polyneuropathy, unspecified: Secondary | ICD-10-CM | POA: Diagnosis not present

## 2012-06-27 DIAGNOSIS — E039 Hypothyroidism, unspecified: Secondary | ICD-10-CM | POA: Diagnosis not present

## 2012-06-27 DIAGNOSIS — N3090 Cystitis, unspecified without hematuria: Secondary | ICD-10-CM | POA: Diagnosis not present

## 2012-06-27 DIAGNOSIS — R55 Syncope and collapse: Secondary | ICD-10-CM

## 2012-06-27 DIAGNOSIS — N3000 Acute cystitis without hematuria: Secondary | ICD-10-CM | POA: Diagnosis not present

## 2012-06-27 DIAGNOSIS — E669 Obesity, unspecified: Secondary | ICD-10-CM | POA: Diagnosis not present

## 2012-06-27 DIAGNOSIS — G622 Polyneuropathy due to other toxic agents: Secondary | ICD-10-CM | POA: Diagnosis not present

## 2012-06-27 DIAGNOSIS — R5381 Other malaise: Secondary | ICD-10-CM | POA: Diagnosis not present

## 2012-07-02 ENCOUNTER — Telehealth: Payer: Self-pay | Admitting: Physician Assistant

## 2012-07-02 NOTE — Telephone Encounter (Signed)
Patient was calling to get test results

## 2012-07-02 NOTE — Telephone Encounter (Signed)
Patient notified of stable echo results.

## 2012-07-03 ENCOUNTER — Ambulatory Visit: Payer: Medicare Other | Admitting: Cardiology

## 2012-08-07 DIAGNOSIS — R350 Frequency of micturition: Secondary | ICD-10-CM | POA: Diagnosis not present

## 2012-08-07 DIAGNOSIS — N318 Other neuromuscular dysfunction of bladder: Secondary | ICD-10-CM | POA: Diagnosis not present

## 2012-08-07 DIAGNOSIS — R82998 Other abnormal findings in urine: Secondary | ICD-10-CM | POA: Diagnosis not present

## 2012-08-10 DIAGNOSIS — B351 Tinea unguium: Secondary | ICD-10-CM | POA: Diagnosis not present

## 2012-08-10 DIAGNOSIS — I70209 Unspecified atherosclerosis of native arteries of extremities, unspecified extremity: Secondary | ICD-10-CM | POA: Diagnosis not present

## 2012-08-10 DIAGNOSIS — L851 Acquired keratosis [keratoderma] palmaris et plantaris: Secondary | ICD-10-CM | POA: Diagnosis not present

## 2012-08-22 DIAGNOSIS — R599 Enlarged lymph nodes, unspecified: Secondary | ICD-10-CM | POA: Diagnosis not present

## 2012-08-22 DIAGNOSIS — J209 Acute bronchitis, unspecified: Secondary | ICD-10-CM | POA: Diagnosis not present

## 2012-08-22 DIAGNOSIS — J040 Acute laryngitis: Secondary | ICD-10-CM | POA: Diagnosis not present

## 2012-09-10 DIAGNOSIS — Z85828 Personal history of other malignant neoplasm of skin: Secondary | ICD-10-CM | POA: Diagnosis not present

## 2012-09-10 DIAGNOSIS — L570 Actinic keratosis: Secondary | ICD-10-CM | POA: Diagnosis not present

## 2012-09-10 DIAGNOSIS — M542 Cervicalgia: Secondary | ICD-10-CM | POA: Diagnosis not present

## 2012-09-10 DIAGNOSIS — R499 Unspecified voice and resonance disorder: Secondary | ICD-10-CM | POA: Diagnosis not present

## 2012-09-12 ENCOUNTER — Other Ambulatory Visit: Payer: Self-pay | Admitting: Physician Assistant

## 2012-09-12 ENCOUNTER — Telehealth: Payer: Self-pay

## 2012-09-12 ENCOUNTER — Encounter: Payer: Self-pay | Admitting: *Deleted

## 2012-09-12 ENCOUNTER — Ambulatory Visit (INDEPENDENT_AMBULATORY_CARE_PROVIDER_SITE_OTHER): Payer: Medicare Other | Admitting: Physician Assistant

## 2012-09-12 ENCOUNTER — Encounter: Payer: Self-pay | Admitting: Physician Assistant

## 2012-09-12 VITALS — BP 102/69 | HR 64 | Ht 69.0 in | Wt 201.0 lb

## 2012-09-12 DIAGNOSIS — R072 Precordial pain: Secondary | ICD-10-CM

## 2012-09-12 DIAGNOSIS — I38 Endocarditis, valve unspecified: Secondary | ICD-10-CM

## 2012-09-12 MED ORDER — ASPIRIN EC 81 MG PO TBEC: 81.0000 mg | DELAYED_RELEASE_TABLET | Freq: Every day | ORAL | Status: DC

## 2012-09-12 MED ORDER — NITROGLYCERIN 0.4 MG SL SUBL: 0.4000 mg | SUBLINGUAL_TABLET | SUBLINGUAL | Status: DC | PRN

## 2012-09-12 NOTE — Assessment & Plan Note (Signed)
We'll order a Lexiscan Myoview for risk stratification. Patient has no known CAD, and had a nondiagnostic, low risk adenosine Cardiolite study in 2007. Patient will be started on ASA 81 mg daily, and return to see me in 2 weeks for for review of study results and further recommendations. Will also provide her with NTG tablets.

## 2012-09-12 NOTE — Patient Instructions (Signed)
   Aspirin 81mg  daily  Nitroglycerin refill sent to pharmacy Continue all other current medications.  Lexiscan Myoview (stress test) Office will contact with results Follow up in  2 weeks

## 2012-09-12 NOTE — Telephone Encounter (Signed)
Lexiscan Myoview (stress test) 786.51, 424.90 Weight 201   Thursday, September 20, 2012 [facility]

## 2012-09-12 NOTE — Progress Notes (Signed)
Primary Cardiologist: [name], MD   HPI: Patient presents for scheduled followup. When last seen in January, Dr. Andee Lineman ordered a surveillance echocardiogram for reassessment of MR.   - EF 60-65%, grade 1 diastolic dysfunction, no focal WMAs, mild MR, moderate TR.  Clinically, she reports no significant change in baseline exercise tolerance level. Of note, however, she continues to experience the same left upper chest CP, which she previously described to Dr. Andee Lineman. This is unpredictable in onset, and not correlated with activity or exertion. She had 2 episodes last month, each lasting approximately an hour, and occurring at rest. Also, she experiences mid scapular discomfort, underneath her bra strap, when vacuuming or sweeping floors. This is not related to movement, and is relieved by rest. She suggests that this is new, having developed over the last few months.   Allergies  Allergen Reactions  . Cephalexin   . Codeine   . Indomethacin   . Levofloxacin   . Morphine   . Sulfamethoxazole W-Trimethoprim   . Penicillins Rash    Current Outpatient Prescriptions  Medication Sig Dispense Refill  . Cholecalciferol (VITAMIN D-3) 1000 UNITS CAPS Take 1 capsule by mouth daily.      . clotrimazole (LOTRIMIN) 1 % cream Apply 1 application topically 2 (two) times daily.      . Cranberry-Vitamin C-Vitamin E (CRANBERRY PLUS VITAMIN C) 4200-20-3 MG-MG-UNIT CAPS Take 1 capsule by mouth daily.      Marland Kitchen dexlansoprazole (DEXILANT) 60 MG capsule Take 1 capsule (60 mg total) by mouth daily.  90 capsule  3  . docusate sodium (COLACE) 100 MG capsule Take 100 mg by mouth. Takes 3 x week      . estradiol (ESTRACE) 0.1 MG/GM vaginal cream Place 0.5 g vaginally 2 (two) times a week.      . fludrocortisone (FLORINEF) 0.1 MG tablet Take 1 tablet (0.1 mg total) by mouth 3 (three) times a week. On M-W-F only  45 tablet  3  . fluticasone (FLONASE) 50 MCG/ACT nasal spray Place 2 sprays into the nose as needed.         . gabapentin (NEURONTIN) 300 MG capsule Take 100 mg by mouth 3 (three) times daily.      . Glucosamine-Chondroitin-MSM 750-400-375 MG TABS Take 1 tablet by mouth daily.      Marland Kitchen levothyroxine (SYNTHROID) 50 MCG tablet Take 75 mcg by mouth daily.       Marland Kitchen LIDOCAINE-HYDROCORTISONE ACE EX Apply 1 application topically as needed.      . loratadine (CLARITIN) 10 MG tablet Take 10 mg by mouth daily.        . montelukast (SINGULAIR) 10 MG tablet Take 10 mg by mouth daily.        . Multiple Vitamins-Minerals ([employer] SPECTRAVITE SENIOR) TABS Take 1 tablet by mouth daily.        . nitroGLYCERIN (NITROSTAT) 0.4 MG SL tablet Place 1 tablet (0.4 mg total) under the tongue every 5 (five) minutes as needed.  25 tablet  3  . Probiotic Product (ALIGN PO) Take by mouth daily.        . solifenacin (VESICARE) 10 MG tablet Take 10 mg by mouth daily.        Marland Kitchen topiramate (TOPAMAX) 50 MG tablet 2 in the morning  1 at night       . vitamin C (ASCORBIC ACID) 500 MG tablet Take 1,000 mg by mouth daily.       . vitamin E 400 UNIT capsule Take 400  Units by mouth daily.      Marland Kitchen DISCONTD: nitroGLYCERIN (NITROSTAT) 0.4 MG SL tablet Place 1 tablet (0.4 mg total) under the tongue every 5 (five) minutes as needed.  25 tablet  3  . aspirin EC 81 MG tablet Take 1 tablet (81 mg total) by mouth daily.        Past Medical History  Diagnosis Date  . Syncope and collapse     Neurocardiogenic syncope with positive tilt table test on Florinef  . Allergy to radiographic dye 06/13/2005    tcs by Dr. Jena Gauss  . Diverticulosis of colon (without mention of hemorrhage)   . Pruritus ani   . Irritable bowel syndrome   . GERD (gastroesophageal reflux disease)   . Peripheral polyneuropathy     confirmed by EMG and nerve conduction velocities   . Migraine     syncope with recurrence. postive table-tilt test on Florinef. reuled out pulmonary embolism in 2007. no evidence of structural heart disease.  . Pulmonary nodule   . Mild mitral  regurgitation by prior echocardiogram   . Hemorrhoids, internal 06/16/2005    tcs by Dr. Jena Gauss    Past Surgical History  Procedure Date  . Wrist surgery   . Foot surgery   . Total abdominal hysterectomy   . Appendectomy   . Back surgery   . Back pain     going to chiropracator for back and hip pain  . Colonoscopy 06/16/2005    internal hemorrhoids, L side diverticula - Dr. Jena Gauss  . Esophagogastroduodenoscopy 12/21/2006      Dr. Elmer Ramp esophagus/Patulous EG junction, small to moderate sized hiatal hernia,   multiple fundal gland type appearing polyps biopsied, otherwise normal    History   Social History  . Marital Status: Widowed    Spouse Name: N/A    Number of Children: N/A  . Years of Education: N/A   Occupational History  . Not on file.   Social History Main Topics  . Smoking status: Never Smoker   . Smokeless tobacco: Never Used   Comment: tobacco use - no  . Alcohol Use: No  . Drug Use: Yes  . Sexually Active: Not on file   Other Topics Concern  . Not on file   Social History Narrative   Retired, widowed, gets regular exercise.     Family History  Problem Relation Age of Onset  . Colon cancer Neg Hx     ROS: no nausea, vomiting; no fever, chills; no melena, hematochezia; no claudication  PHYSICAL EXAM: BP 102/69  Pulse 64  Ht 5\' 9"  (1.753 m)  Wt 201 lb (91.173 kg)  BMI 29.68 kg/m2  SpO2 95% GENERAL: 78 year old female; NAD HEENT: NCAT, PERRLA, EOMI; sclera clear; no xanthelasma NECK: palpable bilateral carotid pulses, no bruits; no JVD; no TM LUNGS: CTA bilaterally CARDIAC: RRR (S1, S2); no significant murmurs; no rubs or gallops ABDOMEN: soft, non-tender; intact BS EXTREMETIES: no significant peripheral edema SKIN: warm/dry; no obvious rash/lesions MUSCULOSKELETAL: no joint deformity NEURO: no focal deficit; NL affect   EKG: reviewed and available in Electronic Records   ASSESSMENT & PLAN:  Chest pain We'll order a Lexiscan  Myoview for risk stratification. Patient has no known CAD, and had a nondiagnostic, low risk adenosine Cardiolite study in 2007. Patient will be started on ASA 81 mg daily, and return to see me in 2 weeks for for review of study results and further recommendations. Will also provide her with NTG tablets.   VALVULAR HEART  DISEASE Stable by most recent echocardiogram this past July. In fact, MR now assessed as mild, compared to previous assessment of moderate, by TEE in July 2012.    Gene Maleigh Bagot, PAC

## 2012-09-12 NOTE — Assessment & Plan Note (Signed)
Stable by most recent echocardiogram this past July. In fact, MR now assessed as mild, compared to previous assessment of moderate, by TEE in July 2012.

## 2012-09-12 NOTE — Telephone Encounter (Signed)
No precert required. Medicare & [employer]

## 2012-09-17 ENCOUNTER — Other Ambulatory Visit: Payer: Self-pay | Admitting: Cardiology

## 2012-09-17 MED ORDER — FLUDROCORTISONE ACETATE 0.1 MG PO TABS: 0.1000 mg | ORAL_TABLET | ORAL | Status: DC

## 2012-09-19 DIAGNOSIS — Z1231 Encounter for screening mammogram for malignant neoplasm of breast: Secondary | ICD-10-CM | POA: Diagnosis not present

## 2012-09-20 DIAGNOSIS — R072 Precordial pain: Secondary | ICD-10-CM | POA: Diagnosis not present

## 2012-09-20 DIAGNOSIS — I38 Endocarditis, valve unspecified: Secondary | ICD-10-CM | POA: Diagnosis not present

## 2012-09-21 DIAGNOSIS — R072 Precordial pain: Secondary | ICD-10-CM | POA: Diagnosis not present

## 2012-09-21 DIAGNOSIS — I38 Endocarditis, valve unspecified: Secondary | ICD-10-CM | POA: Diagnosis not present

## 2012-09-21 DIAGNOSIS — R079 Chest pain, unspecified: Secondary | ICD-10-CM | POA: Diagnosis not present

## 2012-09-26 ENCOUNTER — Encounter: Payer: Self-pay | Admitting: *Deleted

## 2012-09-27 ENCOUNTER — Ambulatory Visit: Payer: Medicare Other | Admitting: Physician Assistant

## 2012-10-01 ENCOUNTER — Other Ambulatory Visit: Payer: Self-pay

## 2012-10-02 MED ORDER — DEXLANSOPRAZOLE 60 MG PO CPDR: 60.0000 mg | DELAYED_RELEASE_CAPSULE | Freq: Every day | ORAL | Status: DC

## 2012-10-03 DIAGNOSIS — H2510 Age-related nuclear cataract, unspecified eye: Secondary | ICD-10-CM | POA: Diagnosis not present

## 2012-10-03 DIAGNOSIS — H1044 Vernal conjunctivitis: Secondary | ICD-10-CM | POA: Diagnosis not present

## 2012-10-03 DIAGNOSIS — H16229 Keratoconjunctivitis sicca, not specified as Sjogren's, unspecified eye: Secondary | ICD-10-CM | POA: Diagnosis not present

## 2012-10-09 DIAGNOSIS — Z23 Encounter for immunization: Secondary | ICD-10-CM | POA: Diagnosis not present

## 2012-10-12 ENCOUNTER — Ambulatory Visit (INDEPENDENT_AMBULATORY_CARE_PROVIDER_SITE_OTHER): Payer: Medicare Other | Admitting: Physician Assistant

## 2012-10-12 ENCOUNTER — Encounter: Payer: Self-pay | Admitting: Physician Assistant

## 2012-10-12 VITALS — BP 120/78 | HR 58 | Ht 69.0 in | Wt 200.0 lb

## 2012-10-12 DIAGNOSIS — R0609 Other forms of dyspnea: Secondary | ICD-10-CM | POA: Insufficient documentation

## 2012-10-12 DIAGNOSIS — R0989 Other specified symptoms and signs involving the circulatory and respiratory systems: Secondary | ICD-10-CM

## 2012-10-12 DIAGNOSIS — R072 Precordial pain: Secondary | ICD-10-CM | POA: Diagnosis not present

## 2012-10-12 DIAGNOSIS — N390 Urinary tract infection, site not specified: Secondary | ICD-10-CM | POA: Diagnosis not present

## 2012-10-12 DIAGNOSIS — R609 Edema, unspecified: Secondary | ICD-10-CM | POA: Diagnosis not present

## 2012-10-12 DIAGNOSIS — I38 Endocarditis, valve unspecified: Secondary | ICD-10-CM

## 2012-10-12 DIAGNOSIS — R0600 Dyspnea, unspecified: Secondary | ICD-10-CM

## 2012-10-12 MED ORDER — FUROSEMIDE 20 MG PO TABS: 20.0000 mg | ORAL_TABLET | Freq: Every day | ORAL | Status: DC

## 2012-10-12 NOTE — Assessment & Plan Note (Signed)
Stable by most recent echocardiogram this past July. In fact, MR now assessed as mild, compared to previous assessment of moderate, by TEE, in July 2012.

## 2012-10-12 NOTE — Assessment & Plan Note (Signed)
We'll initiate mild diuretic treatment for possible mild diastolic heart failure (grade 1 diastolic dysfunction, by recent 2-D echo). Will check comprehensive baseline labs today, including BNP level, and reassess clinical status in 2 weeks.

## 2012-10-12 NOTE — Patient Instructions (Signed)
   Begin Lasix 20mg  daily  Continue all other current medications. Labs:  CMET, BNP - today Office will contact with results Follow up in  2 weeks

## 2012-10-12 NOTE — Assessment & Plan Note (Signed)
No further cardiac workup indicated. Recent Lexiscan Cardiolite negative for definite ischemia. Patient also reports no further CP. Continue current medication regimen.

## 2012-10-12 NOTE — Progress Notes (Signed)
Primary Cardiologist:  HPI: Patient returns for early scheduled followup, and review of recent outpatient Lexiscan Cardiolite test for evaluation of atypical CP.  This was completed on October 3, and was negative for any definite evidence of scar/ischemia; EF 61%.  Clinically, she reports no further anterior CP. However, she continues to experience significant DOE, but denies orthopnea or PND. She has noted some mild, bilateral pedal edema, worse on the left.  Allergies  Allergen Reactions  . Cephalexin   . Codeine   . Indomethacin   . Levofloxacin   . Morphine   . Sulfamethoxazole W-Trimethoprim   . Penicillins Rash    Current Outpatient Prescriptions  Medication Sig Dispense Refill  . aspirin EC 81 MG tablet Take 1 tablet (81 mg total) by mouth daily.      . Cholecalciferol (VITAMIN D-3) 1000 UNITS CAPS Take 1 capsule by mouth daily.      . Cranberry-Vitamin C-Vitamin E (CRANBERRY PLUS VITAMIN C) 4200-20-3 MG-MG-UNIT CAPS Take 1 capsule by mouth daily.      Marland Kitchen dexlansoprazole (DEXILANT) 60 MG capsule Take 1 capsule (60 mg total) by mouth daily.  90 capsule  3  . docusate sodium (COLACE) 100 MG capsule Take 100 mg by mouth. Takes 1-3 x week      . estradiol (ESTRACE) 0.1 MG/GM vaginal cream Place 0.5 g vaginally 2 (two) times a week.      . fludrocortisone (FLORINEF) 0.1 MG tablet Take 1 tablet (0.1 mg total) by mouth 3 (three) times a week.  36 tablet  3  . fluticasone (FLONASE) 50 MCG/ACT nasal spray Place 2 sprays into the nose as needed.        . gabapentin (NEURONTIN) 100 MG capsule Take 200 mg by mouth as directed.      . Glucosamine-Chondroitin-MSM 750-400-375 MG TABS Take 1 tablet by mouth daily.      Marland Kitchen levothyroxine (SYNTHROID) 50 MCG tablet Take 75 mcg by mouth daily.       Marland Kitchen LIDOCAINE-HYDROCORTISONE ACE EX Apply 1 application topically as needed.      . loratadine (CLARITIN) 10 MG tablet Take 10 mg by mouth daily.        . montelukast (SINGULAIR) 10 MG tablet Take 10 mg  by mouth daily.        . Multiple Vitamins-Minerals ([employer] SPECTRAVITE SENIOR) TABS Take 1 tablet by mouth daily.        . nitroGLYCERIN (NITROSTAT) 0.4 MG SL tablet Place 1 tablet (0.4 mg total) under the tongue every 5 (five) minutes as needed.  25 tablet  3  . Probiotic Product (ALIGN PO) Take by mouth daily.        . solifenacin (VESICARE) 10 MG tablet Take 10 mg by mouth daily.        Marland Kitchen topiramate (TOPAMAX) 50 MG tablet Take 100 mg by mouth every morning. & 1 at night      . vitamin C (ASCORBIC ACID) 500 MG tablet Take 1,000 mg by mouth daily.       . vitamin E 400 UNIT capsule Take 400 Units by mouth daily.        Past Medical History  Diagnosis Date  . Syncope and collapse     Neurocardiogenic syncope with positive tilt table test on Florinef  . Allergy to radiographic dye 06/13/2005    tcs by Dr. Jena Gauss  . Diverticulosis of colon (without mention of hemorrhage)   . Pruritus ani   . Irritable bowel syndrome   .  GERD (gastroesophageal reflux disease)   . Peripheral polyneuropathy     confirmed by EMG and nerve conduction velocities   . Migraine     syncope with recurrence. postive table-tilt test on Florinef. reuled out pulmonary embolism in 2007. no evidence of structural heart disease.  . Pulmonary nodule   . Mild mitral regurgitation by prior echocardiogram   . Hemorrhoids, internal 06/16/2005    tcs by Dr. Jena Gauss    Past Surgical History  Procedure Date  . Wrist surgery   . Foot surgery   . Total abdominal hysterectomy   . Appendectomy   . Back surgery   . Back pain     going to chiropracator for back and hip pain  . Colonoscopy 06/16/2005    internal hemorrhoids, L side diverticula - Dr. Jena Gauss  . Esophagogastroduodenoscopy 12/21/2006      Dr. Elmer Ramp esophagus/Patulous EG junction, small to moderate sized hiatal hernia,   multiple fundal gland type appearing polyps biopsied, otherwise normal    History   Social History  . Marital Status: Widowed    Spouse  Name: N/A    Number of Children: N/A  . Years of Education: N/A   Occupational History  . Not on file.   Social History Main Topics  . Smoking status: Never Smoker   . Smokeless tobacco: Never Used   Comment: tobacco use - no  . Alcohol Use: No  . Drug Use: Yes  . Sexually Active: Not on file   Other Topics Concern  . Not on file   Social History Narrative   Retired, widowed, gets regular exercise.     Family History  Problem Relation Age of Onset  . Colon cancer Neg Hx     ROS: no nausea, vomiting; no fever, chills; no melena, hematochezia; no claudication  PHYSICAL EXAM: BP 120/78  Pulse 58  Ht 5\' 9"  (1.753 m)  Wt 200 lb (90.719 kg)  BMI 29.53 kg/m2 GENERAL: 78 year old female; NAD  HEENT: NCAT, PERRLA, EOMI; sclera clear; no xanthelasma  NECK: palpable bilateral carotid pulses, no bruits; no JVD; no TM  LUNGS: CTA bilaterally  CARDIAC: RRR (S1, S2); no significant murmurs; no rubs or gallops  ABDOMEN: soft, non-tender; intact BS  EXTREMETIES: Trace pedal edema, worse on left  SKIN: warm/dry; no obvious rash/lesions  MUSCULOSKELETAL: no joint deformity  NEURO: no focal deficit; NL affect    EKG:    ASSESSMENT & PLAN:  Chest pain No further cardiac workup indicated. Recent Lexiscan Cardiolite negative for definite ischemia. Patient also reports no further CP. Continue current medication regimen.  Exertional dyspnea We'll initiate mild diuretic treatment for possible mild diastolic heart failure (grade 1 diastolic dysfunction, by recent 2-D echo). Will check comprehensive baseline labs today, including BNP level, and reassess clinical status in 2 weeks.  VALVULAR HEART DISEASE Stable by most recent echocardiogram this past July. In fact, MR now assessed as mild, compared to previous assessment of moderate, by TEE, in July 2012.     Gene Cheveyo [place], PAC

## 2012-10-16 ENCOUNTER — Telehealth: Payer: Self-pay | Admitting: *Deleted

## 2012-10-16 NOTE — Telephone Encounter (Signed)
Patient informed. 

## 2012-10-16 NOTE — Telephone Encounter (Signed)
Message copied by Eustace Moore on Tue Oct 16, 2012  2:19 PM ------      Message from: Rande Brunt      Created: Tue Oct 16, 2012 12:40 PM       BNP 170. Review at early f/u OV

## 2012-10-19 DIAGNOSIS — L851 Acquired keratosis [keratoderma] palmaris et plantaris: Secondary | ICD-10-CM | POA: Diagnosis not present

## 2012-10-19 DIAGNOSIS — I70209 Unspecified atherosclerosis of native arteries of extremities, unspecified extremity: Secondary | ICD-10-CM | POA: Diagnosis not present

## 2012-10-19 DIAGNOSIS — B351 Tinea unguium: Secondary | ICD-10-CM | POA: Diagnosis not present

## 2012-10-29 ENCOUNTER — Ambulatory Visit: Payer: Medicare Other | Admitting: Physician Assistant

## 2012-11-01 ENCOUNTER — Ambulatory Visit (INDEPENDENT_AMBULATORY_CARE_PROVIDER_SITE_OTHER): Payer: Medicare Other | Admitting: Physician Assistant

## 2012-11-01 VITALS — BP 126/82 | HR 64 | Ht 69.0 in | Wt 195.8 lb

## 2012-11-01 DIAGNOSIS — R0609 Other forms of dyspnea: Secondary | ICD-10-CM

## 2012-11-01 DIAGNOSIS — I38 Endocarditis, valve unspecified: Secondary | ICD-10-CM

## 2012-11-01 DIAGNOSIS — I5032 Chronic diastolic (congestive) heart failure: Secondary | ICD-10-CM

## 2012-11-01 DIAGNOSIS — Z79899 Other long term (current) drug therapy: Secondary | ICD-10-CM | POA: Diagnosis not present

## 2012-11-01 DIAGNOSIS — R0989 Other specified symptoms and signs involving the circulatory and respiratory systems: Secondary | ICD-10-CM | POA: Diagnosis not present

## 2012-11-01 NOTE — Progress Notes (Deleted)
Primary Cardiologist: Simona Huh, MD   HPI: Post hospital followup from [facility], status post minimally invasive MV repair, by Dr. Tressie Stalker, on October 22, with concomitant MAZE procedure.  Allergies  Allergen Reactions  . Cephalexin   . Codeine   . Indomethacin   . Levofloxacin   . Morphine   . Sulfamethoxazole W-Trimethoprim   . Penicillins Rash    Current Outpatient Prescriptions  Medication Sig Dispense Refill  . aspirin EC 81 MG tablet Take 1 tablet (81 mg total) by mouth daily.      . Cholecalciferol (VITAMIN D-3) 1000 UNITS CAPS Take 1 capsule by mouth daily.      . Cranberry-Vitamin C-Vitamin E (CRANBERRY PLUS VITAMIN C) 4200-20-3 MG-MG-UNIT CAPS Take 1 capsule by mouth daily.      Marland Kitchen dexlansoprazole (DEXILANT) 60 MG capsule Take 1 capsule (60 mg total) by mouth daily.  90 capsule  3  . docusate sodium (COLACE) 100 MG capsule Take 100 mg by mouth. Takes 1-3 x week      . estradiol (ESTRACE) 0.1 MG/GM vaginal cream Place 0.5 g vaginally 2 (two) times a week.      . fludrocortisone (FLORINEF) 0.1 MG tablet Take 1 tablet (0.1 mg total) by mouth 3 (three) times a week.  36 tablet  3  . fluticasone (FLONASE) 50 MCG/ACT nasal spray Place 2 sprays into the nose as needed.        . furosemide (LASIX) 20 MG tablet Take 1 tablet (20 mg total) by mouth daily.  30 tablet  6  . gabapentin (NEURONTIN) 100 MG capsule Take 200 mg by mouth as directed.      . Glucosamine-Chondroitin-MSM 750-400-375 MG TABS Take 1 tablet by mouth daily.      Marland Kitchen levothyroxine (SYNTHROID) 50 MCG tablet Take 75 mcg by mouth daily.       Marland Kitchen LIDOCAINE-HYDROCORTISONE ACE EX Apply 1 application topically as needed.      . loratadine (CLARITIN) 10 MG tablet Take 10 mg by mouth daily.        . montelukast (SINGULAIR) 10 MG tablet Take 10 mg by mouth daily.        . Multiple Vitamins-Minerals ([employer] SPECTRAVITE SENIOR) TABS Take 1 tablet by mouth daily.        . nitroGLYCERIN (NITROSTAT) 0.4 MG SL tablet Place 1 tablet  (0.4 mg total) under the tongue every 5 (five) minutes as needed.  25 tablet  3  . Probiotic Product (ALIGN PO) Take by mouth daily.        . solifenacin (VESICARE) 10 MG tablet Take 10 mg by mouth daily.        Marland Kitchen topiramate (TOPAMAX) 50 MG tablet Take 100 mg by mouth every morning. & 1 at night      . vitamin C (ASCORBIC ACID) 500 MG tablet Take 1,000 mg by mouth daily.       . vitamin E 400 UNIT capsule Take 400 Units by mouth daily.        Past Medical History  Diagnosis Date  . Syncope and collapse     Neurocardiogenic syncope with positive tilt table test on Florinef  . Allergy to radiographic dye 06/13/2005    tcs by Dr. Jena Gauss  . Diverticulosis of colon (without mention of hemorrhage)   . Pruritus ani   . Irritable bowel syndrome   . GERD (gastroesophageal reflux disease)   . Peripheral polyneuropathy     confirmed by EMG and nerve conduction velocities   .  Migraine     syncope with recurrence. postive table-tilt test on Florinef. reuled out pulmonary embolism in 2007. no evidence of structural heart disease.  . Pulmonary nodule   . Mild mitral regurgitation by prior echocardiogram   . Hemorrhoids, internal 06/16/2005    tcs by Dr. Jena Gauss    Past Surgical History  Procedure Date  . Wrist surgery   . Foot surgery   . Total abdominal hysterectomy   . Appendectomy   . Back surgery   . Back pain     going to chiropracator for back and hip pain  . Colonoscopy 06/16/2005    internal hemorrhoids, L side diverticula - Dr. Jena Gauss  . Esophagogastroduodenoscopy 12/21/2006      Dr. Elmer Ramp esophagus/Patulous EG junction, small to moderate sized hiatal hernia,   multiple fundal gland type appearing polyps biopsied, otherwise normal    History   Social History  . Marital Status: Widowed    Spouse Name: N/A    Number of Children: N/A  . Years of Education: N/A   Occupational History  . Not on file.   Social History Main Topics  . Smoking status: Never Smoker   .  Smokeless tobacco: Never Used     Comment: tobacco use - no  . Alcohol Use: No  . Drug Use: Yes  . Sexually Active: Not on file   Other Topics Concern  . Not on file   Social History Narrative   Retired, widowed, gets regular exercise.     Family History  Problem Relation Age of Onset  . Colon cancer Neg Hx     ROS: no nausea, vomiting; no fever, chills; no melena, hematochezia; no claudication  PHYSICAL EXAM: There were no vitals taken for this visit. GENERAL: 78 year old female; NAD HEENT: NCAT, PERRLA, EOMI; sclera clear; no xanthelasma NECK: palpable bilateral carotid pulses, no bruits; no JVD; no TM LUNGS: CTA bilaterally CARDIAC: RRR (S1, S2); no significant murmurs; no rubs or gallops ABDOMEN: soft, non-tender; intact BS EXTREMETIES: intact distal pulses; no significant peripheral edema SKIN: warm/dry; no obvious rash/lesions MUSCULOSKELETAL: no joint deformity NEURO: no focal deficit; NL affect   EKG: reviewed and available in Electronic Records   ASSESSMENT & PLAN:  No problem-specific assessment & plan notes found for this encounter.   Gene Rhodes Calvert, PAC

## 2012-11-01 NOTE — Patient Instructions (Signed)
Continue all current medications. Labs - BMET, BNP  Office will contact with results Follow up in  4 months

## 2012-11-01 NOTE — Assessment & Plan Note (Signed)
Patient now appears euvolemic by history and exam. She has diuresed 5 pounds since last OV, and reports complete resolution of her peripheral edema. We will check followup BMET/BNP level. Recommend continuing low-dose Lasix and reassessing clinical status in 4 months with Dr. Diona Browner, with whom she has elected to establish.

## 2012-11-01 NOTE — Progress Notes (Signed)
Primary Cardiologist: Simona Huh, MD (new)  HPI: Patient presents for early scheduled followup.  When last seen on October 25, I started her on low-dose Lasix for treatment of possible mild DHF. Same day baseline labs were stable, but with slightly elevated BNP of 170. She presents today reporting symptomatic improvement, and essentially complete resolution of her mild peripheral edema. She has diuresed 5 pounds. She does report some cough with grayish tinged sputum, but denies fever. She does not suggest orthopnea or PND.  Allergies  Allergen Reactions  . Cephalexin   . Codeine   . Indomethacin   . Levofloxacin   . Morphine   . Sulfamethoxazole W-Trimethoprim   . Penicillins Rash    Current Outpatient Prescriptions  Medication Sig Dispense Refill  . aspirin EC 81 MG tablet Take 1 tablet (81 mg total) by mouth daily.      . Cholecalciferol (VITAMIN D-3) 1000 UNITS CAPS Take 1 capsule by mouth daily.      . Cranberry-Vitamin C-Vitamin E (CRANBERRY PLUS VITAMIN C) 4200-20-3 MG-MG-UNIT CAPS Take 1 capsule by mouth daily.      Marland Kitchen dexlansoprazole (DEXILANT) 60 MG capsule Take 1 capsule (60 mg total) by mouth daily.  90 capsule  3  . docusate sodium (COLACE) 100 MG capsule Take 100 mg by mouth. Takes 1-3 x week      . estradiol (ESTRACE) 0.1 MG/GM vaginal cream Place 0.5 g vaginally 2 (two) times a week.      . fludrocortisone (FLORINEF) 0.1 MG tablet Take 1 tablet (0.1 mg total) by mouth 3 (three) times a week.  36 tablet  3  . fluticasone (FLONASE) 50 MCG/ACT nasal spray Place 2 sprays into the nose as needed.        . furosemide (LASIX) 20 MG tablet Take 1 tablet (20 mg total) by mouth daily.  30 tablet  6  . gabapentin (NEURONTIN) 100 MG capsule Take 200 mg by mouth as directed.      . Glucosamine-Chondroitin-MSM 750-400-375 MG TABS Take 1 tablet by mouth daily.      Marland Kitchen levothyroxine (SYNTHROID) 50 MCG tablet Take 75 mcg by mouth daily.       Marland Kitchen LIDOCAINE-HYDROCORTISONE ACE EX Apply 1  application topically as needed.      . loratadine (CLARITIN) 10 MG tablet Take 10 mg by mouth daily.        . montelukast (SINGULAIR) 10 MG tablet Take 10 mg by mouth daily.        . Multiple Vitamins-Minerals ([employer]S SPECTRAVITE SENIOR) TABS Take 1 tablet by mouth daily.        . nitroGLYCERIN (NITROSTAT) 0.4 MG SL tablet Place 1 tablet (0.4 mg total) under the tongue every 5 (five) minutes as needed.  25 tablet  3  . Probiotic Product (ALIGN PO) Take by mouth daily.        . solifenacin (VESICARE) 10 MG tablet Take 10 mg by mouth daily.        Marland Kitchen topiramate (TOPAMAX) 50 MG tablet Take 100 mg by mouth every morning. & 1 at night      . vitamin C (ASCORBIC ACID) 500 MG tablet Take 1,000 mg by mouth daily.       . vitamin E 400 UNIT capsule Take 400 Units by mouth daily.        Past Medical History  Diagnosis Date  . Syncope and collapse     Neurocardiogenic syncope with positive tilt table test on Florinef  . Allergy to  radiographic dye 06/13/2005    tcs by Dr. Jena Gauss  . Diverticulosis of colon (without mention of hemorrhage)   . Pruritus ani   . Irritable bowel syndrome   . GERD (gastroesophageal reflux disease)   . Peripheral polyneuropathy     confirmed by EMG and nerve conduction velocities   . Migraine     syncope with recurrence. postive table-tilt test on Florinef. reuled out pulmonary embolism in 2007. no evidence of structural heart disease.  . Pulmonary nodule   . Mild mitral regurgitation by prior echocardiogram   . Hemorrhoids, internal 06/16/2005    tcs by Dr. Jena Gauss    Past Surgical History  Procedure Date  . Wrist surgery   . Foot surgery   . Total abdominal hysterectomy   . Appendectomy   . Back surgery   . Back pain     going to chiropracator for back and hip pain  . Colonoscopy 06/16/2005    internal hemorrhoids, L side diverticula - Dr. Jena Gauss  . Esophagogastroduodenoscopy 12/21/2006      Dr. Elmer Ramp esophagus/Patulous EG junction, small to moderate sized  hiatal hernia,   multiple fundal gland type appearing polyps biopsied, otherwise normal    History   Social History  . Marital Status: Widowed    Spouse Name: N/A    Number of Children: N/A  . Years of Education: N/A   Occupational History  . Not on file.   Social History Main Topics  . Smoking status: Never Smoker   . Smokeless tobacco: Never Used     Comment: tobacco use - no  . Alcohol Use: No  . Drug Use: Yes  . Sexually Active: Not on file   Other Topics Concern  . Not on file   Social History Narrative   Retired, widowed, gets regular exercise.     Family History  Problem Relation Age of Onset  . Colon cancer Neg Hx     ROS: no nausea, vomiting; no fever, chills; no melena, hematochezia; no claudication  PHYSICAL EXAM: BP 126/82  Pulse 64  Ht 5\' 9"  (1.753 m)  Wt 195 lb 12.8 oz (88.814 kg)  BMI 28.91 kg/m2  SpO2 98% GENERAL: 78 year old female; NAD  HEENT: NCAT, PERRLA, EOMI; sclera clear; no xanthelasma  NECK: palpable bilateral carotid pulses, no bruits; pulsating venous distention in right supraclavicular fossa LUNGS: CTA bilaterally  CARDIAC: RRR (S1, S2); no significant murmurs; no rubs or gallops  ABDOMEN: soft, non-tender; intact BS  EXTREMETIES: No significant peripheral edema SKIN: warm/dry; no obvious rash/lesions  MUSCULOSKELETAL: no joint deformity  NEURO: no focal deficit; NL affect   EKG:    ASSESSMENT & PLAN:  Chronic diastolic heart failure Patient now appears euvolemic by history and exam. She has diuresed 5 pounds since last OV, and reports complete resolution of her peripheral edema. We will check followup BMET/BNP level. Recommend continuing low-dose Lasix and reassessing clinical status in 4 months with Dr. Diona Browner, with whom she has elected to establish.   VALVULAR HEART DISEASE Most recently assessed as mild, by echocardiogram, 06/2012. Previously assessed as moderate, by TEE, July 2012.    Gene Indiyah Paone, PAC

## 2012-11-01 NOTE — Assessment & Plan Note (Signed)
Most recently assessed as mild, by echocardiogram, 06/2012. Previously assessed as moderate, by TEE, July 2012.

## 2012-11-02 ENCOUNTER — Encounter: Payer: Self-pay | Admitting: *Deleted

## 2012-11-19 DIAGNOSIS — R350 Frequency of micturition: Secondary | ICD-10-CM | POA: Diagnosis not present

## 2012-11-19 DIAGNOSIS — N390 Urinary tract infection, site not specified: Secondary | ICD-10-CM | POA: Diagnosis not present

## 2012-11-19 DIAGNOSIS — R300 Dysuria: Secondary | ICD-10-CM | POA: Diagnosis not present

## 2012-11-22 ENCOUNTER — Other Ambulatory Visit: Payer: Self-pay | Admitting: *Deleted

## 2012-11-22 MED ORDER — FUROSEMIDE 20 MG PO TABS: 20.0000 mg | ORAL_TABLET | Freq: Every day | ORAL | Status: DC

## 2012-12-03 DIAGNOSIS — R350 Frequency of micturition: Secondary | ICD-10-CM | POA: Diagnosis not present

## 2012-12-03 DIAGNOSIS — N390 Urinary tract infection, site not specified: Secondary | ICD-10-CM | POA: Diagnosis not present

## 2012-12-17 DIAGNOSIS — M779 Enthesopathy, unspecified: Secondary | ICD-10-CM | POA: Diagnosis not present

## 2012-12-22 DIAGNOSIS — Z87440 Personal history of urinary (tract) infections: Secondary | ICD-10-CM | POA: Diagnosis not present

## 2012-12-22 DIAGNOSIS — M549 Dorsalgia, unspecified: Secondary | ICD-10-CM | POA: Diagnosis not present

## 2012-12-22 DIAGNOSIS — R5383 Other fatigue: Secondary | ICD-10-CM | POA: Diagnosis not present

## 2012-12-22 DIAGNOSIS — E039 Hypothyroidism, unspecified: Secondary | ICD-10-CM | POA: Diagnosis not present

## 2012-12-22 DIAGNOSIS — Z79899 Other long term (current) drug therapy: Secondary | ICD-10-CM | POA: Diagnosis not present

## 2012-12-22 DIAGNOSIS — R5381 Other malaise: Secondary | ICD-10-CM | POA: Diagnosis not present

## 2012-12-22 DIAGNOSIS — T7840XA Allergy, unspecified, initial encounter: Secondary | ICD-10-CM | POA: Diagnosis not present

## 2012-12-24 DIAGNOSIS — E669 Obesity, unspecified: Secondary | ICD-10-CM | POA: Diagnosis not present

## 2012-12-24 DIAGNOSIS — K2100 Gastro-esophageal reflux disease with esophagitis, without bleeding: Secondary | ICD-10-CM | POA: Diagnosis not present

## 2012-12-24 DIAGNOSIS — E7800 Pure hypercholesterolemia, unspecified: Secondary | ICD-10-CM | POA: Diagnosis not present

## 2012-12-24 DIAGNOSIS — E039 Hypothyroidism, unspecified: Secondary | ICD-10-CM | POA: Diagnosis not present

## 2012-12-28 DIAGNOSIS — E039 Hypothyroidism, unspecified: Secondary | ICD-10-CM | POA: Diagnosis not present

## 2012-12-28 DIAGNOSIS — G622 Polyneuropathy due to other toxic agents: Secondary | ICD-10-CM | POA: Diagnosis not present

## 2012-12-28 DIAGNOSIS — N3000 Acute cystitis without hematuria: Secondary | ICD-10-CM | POA: Diagnosis not present

## 2012-12-28 DIAGNOSIS — J309 Allergic rhinitis, unspecified: Secondary | ICD-10-CM | POA: Diagnosis not present

## 2012-12-28 DIAGNOSIS — G619 Inflammatory polyneuropathy, unspecified: Secondary | ICD-10-CM | POA: Diagnosis not present

## 2012-12-28 DIAGNOSIS — M545 Low back pain, unspecified: Secondary | ICD-10-CM | POA: Diagnosis not present

## 2012-12-28 DIAGNOSIS — R300 Dysuria: Secondary | ICD-10-CM | POA: Diagnosis not present

## 2012-12-28 DIAGNOSIS — R350 Frequency of micturition: Secondary | ICD-10-CM | POA: Diagnosis not present

## 2013-01-04 DIAGNOSIS — I70209 Unspecified atherosclerosis of native arteries of extremities, unspecified extremity: Secondary | ICD-10-CM | POA: Diagnosis not present

## 2013-01-04 DIAGNOSIS — B351 Tinea unguium: Secondary | ICD-10-CM | POA: Diagnosis not present

## 2013-01-04 DIAGNOSIS — L851 Acquired keratosis [keratoderma] palmaris et plantaris: Secondary | ICD-10-CM | POA: Diagnosis not present

## 2013-01-07 DIAGNOSIS — G63 Polyneuropathy in diseases classified elsewhere: Secondary | ICD-10-CM | POA: Diagnosis not present

## 2013-01-07 DIAGNOSIS — G40309 Generalized idiopathic epilepsy and epileptic syndromes, not intractable, without status epilepticus: Secondary | ICD-10-CM | POA: Diagnosis not present

## 2013-01-07 DIAGNOSIS — R269 Unspecified abnormalities of gait and mobility: Secondary | ICD-10-CM | POA: Diagnosis not present

## 2013-01-14 DIAGNOSIS — N952 Postmenopausal atrophic vaginitis: Secondary | ICD-10-CM | POA: Diagnosis not present

## 2013-01-14 DIAGNOSIS — N318 Other neuromuscular dysfunction of bladder: Secondary | ICD-10-CM | POA: Diagnosis not present

## 2013-01-14 DIAGNOSIS — R3129 Other microscopic hematuria: Secondary | ICD-10-CM | POA: Diagnosis not present

## 2013-01-14 DIAGNOSIS — R350 Frequency of micturition: Secondary | ICD-10-CM | POA: Diagnosis not present

## 2013-01-28 DIAGNOSIS — R339 Retention of urine, unspecified: Secondary | ICD-10-CM | POA: Diagnosis not present

## 2013-01-28 DIAGNOSIS — N390 Urinary tract infection, site not specified: Secondary | ICD-10-CM | POA: Diagnosis not present

## 2013-01-28 DIAGNOSIS — R350 Frequency of micturition: Secondary | ICD-10-CM | POA: Diagnosis not present

## 2013-03-01 ENCOUNTER — Encounter: Payer: Self-pay | Admitting: Cardiology

## 2013-03-01 ENCOUNTER — Ambulatory Visit (INDEPENDENT_AMBULATORY_CARE_PROVIDER_SITE_OTHER): Payer: Medicare Other | Admitting: Cardiology

## 2013-03-01 VITALS — BP 114/74 | HR 59 | Ht 69.5 in | Wt 193.1 lb

## 2013-03-01 DIAGNOSIS — I5032 Chronic diastolic (congestive) heart failure: Secondary | ICD-10-CM | POA: Diagnosis not present

## 2013-03-01 DIAGNOSIS — R55 Syncope and collapse: Secondary | ICD-10-CM | POA: Diagnosis not present

## 2013-03-01 DIAGNOSIS — I340 Nonrheumatic mitral (valve) insufficiency: Secondary | ICD-10-CM

## 2013-03-01 DIAGNOSIS — I059 Rheumatic mitral valve disease, unspecified: Secondary | ICD-10-CM | POA: Diagnosis not present

## 2013-03-01 DIAGNOSIS — J019 Acute sinusitis, unspecified: Secondary | ICD-10-CM | POA: Diagnosis not present

## 2013-03-01 NOTE — Progress Notes (Signed)
Clinical Summary [name] is an 79 y.o.female presenting for office followup. She is a former patient of Dr. Andee Lineman, last seen by Mr. Serpe November 2013. She states that she has been doing reasonably well. No significant palpitations, no dizziness or syncope. States she has had some trouble with recurring urinary tract infections.  Lab work from November 2013 revealed BUN 15, creatinine 0.8, sodium 141, potassium 3.9, BNP 87. Weight is down 2 pounds from last visit.  Echocardiogram from July 2013 revealed mild LVH with LVEF 60-65%, grade 1 diastolic dysfunction, mild mitral regurgitation, moderate tricuspid regurgitation, PA systolic pressure 47 mm mercury.  Allergies  Allergen Reactions  . Cephalexin   . Codeine   . Indomethacin   . Levofloxacin   . Morphine   . Sulfamethoxazole W-Trimethoprim   . Penicillins Rash    Current Outpatient Prescriptions  Medication Sig Dispense Refill  . aspirin EC 81 MG tablet Take 1 tablet (81 mg total) by mouth daily.      . Cholecalciferol (VITAMIN D-3) 1000 UNITS CAPS Take 1 capsule by mouth daily.      . Cranberry-Vitamin C-Vitamin E (CRANBERRY PLUS VITAMIN C) 4200-20-3 MG-MG-UNIT CAPS Take 1 capsule by mouth daily.      Marland Kitchen dexlansoprazole (DEXILANT) 60 MG capsule Take 1 capsule (60 mg total) by mouth daily.  90 capsule  3  . docusate sodium (COLACE) 100 MG capsule Take 100 mg by mouth. Takes 1-3 x week      . estradiol (ESTRACE) 0.1 MG/GM vaginal cream Place 0.5 g vaginally 2 (two) times a week.      . fludrocortisone (FLORINEF) 0.1 MG tablet Take 1 tablet (0.1 mg total) by mouth 3 (three) times a week.  36 tablet  3  . fluticasone (FLONASE) 50 MCG/ACT nasal spray Place 2 sprays into the nose as needed.        . furosemide (LASIX) 20 MG tablet Take 1 tablet (20 mg total) by mouth daily.  90 tablet  3  . gabapentin (NEURONTIN) 100 MG capsule Take 200 mg by mouth as directed.      . Glucosamine-Chondroitin-MSM 750-400-375 MG TABS Take 1 tablet  by mouth daily.      . Lactobacillus (ACIDOPHILUS) 100 MG CAPS Take 100 mg by mouth daily.      Marland Kitchen levothyroxine (SYNTHROID) 50 MCG tablet Take 75 mcg by mouth daily.       Marland Kitchen LIDOCAINE-HYDROCORTISONE ACE EX Apply 1 application topically as needed.      . montelukast (SINGULAIR) 10 MG tablet Take 10 mg by mouth daily.        . Multiple Vitamins-Minerals ([employer]S SPECTRAVITE SENIOR) TABS Take 1 tablet by mouth daily.        . nitroGLYCERIN (NITROSTAT) 0.4 MG SL tablet Place 1 tablet (0.4 mg total) under the tongue every 5 (five) minutes as needed.  25 tablet  3  . Probiotic Product (ALIGN PO) Take by mouth daily.        . solifenacin (VESICARE) 10 MG tablet Take 10 mg by mouth daily.        Marland Kitchen topiramate (TOPAMAX) 50 MG tablet Take 100 mg by mouth every morning. & 1 at night      . vitamin C (ASCORBIC ACID) 500 MG tablet Take 1,000 mg by mouth daily.       . vitamin E 400 UNIT capsule Take 400 Units by mouth daily.       No current facility-administered medications for this visit.  Past Medical History  Diagnosis Date  . Syncope     Neurocardiogenic - positive tilt table test, on Florinef  . Allergy to radiographic dye   . Diverticulosis of colon (without mention of hemorrhage)     TCS by Dr. Jena Gauss  . Irritable bowel syndrome   . GERD (gastroesophageal reflux disease)   . Peripheral polyneuropathy     Confirmed by EMG and nerve conduction velocities   . Migraine   . Pulmonary nodule   . Mitral regurgitation   . Hemorrhoids, internal     TCS by Dr. Jena Gauss  . Chronic diastolic heart failure     Social History [name] reports that she has never smoked. She has never used smokeless tobacco. [name] reports that she does not drink alcohol.  Review of Systems Negative except as outlined above.  Physical Examination Filed Vitals:   03/01/13 1254  BP: 114/74  Pulse: 59   Filed Weights   03/01/13 1254  Weight: 193 lb 1.9 oz (87.599 kg)   No acute distress. HEENT:  Conjunctiva and lids normal, oropharynx clear. Neck: Supple, no elevated JVP or carotid bruits, no thyromegaly. Lungs: Clear to auscultation, nonlabored breathing at rest. Cardiac: Regular rate and rhythm, no S3. Abdomen: Soft, nontender,bowel sounds present. Extremities: No pitting edema, distal pulses 2+.   Problem List and Plan   Chronic diastolic heart failure Seems euvolemic, echocardiogram from last year showing mild diastolic dysfunction. She continues on low-dose diuretic, weight is down 2 pounds. Should be careful not to over diuresis particularly with history of neurocardiogenic syncope.  Vasovagal syncope She continues on Florinef.  Mitral regurgitation Mild by echocardiogram from last July.    Jonelle Sidle, M.D., F.A.C.C.

## 2013-03-01 NOTE — Patient Instructions (Signed)
Continue all current medications. Your physician wants you to follow up in: 6 months.  You will receive a reminder letter in the mail one-two months in advance.  If you don't receive a letter, please call our office to schedule the follow up appointment   

## 2013-03-01 NOTE — Assessment & Plan Note (Signed)
Seems euvolemic, echocardiogram from last year showing mild diastolic dysfunction. She continues on low-dose diuretic, weight is down 2 pounds. Should be careful not to over diuresis particularly with history of neurocardiogenic syncope.

## 2013-03-01 NOTE — Assessment & Plan Note (Signed)
She continues on Florinef.

## 2013-03-01 NOTE — Assessment & Plan Note (Signed)
Mild by echocardiogram from last July.

## 2013-03-06 DIAGNOSIS — D2330 Other benign neoplasm of skin of unspecified part of face: Secondary | ICD-10-CM | POA: Diagnosis not present

## 2013-03-06 DIAGNOSIS — D485 Neoplasm of uncertain behavior of skin: Secondary | ICD-10-CM | POA: Diagnosis not present

## 2013-03-06 DIAGNOSIS — Z85828 Personal history of other malignant neoplasm of skin: Secondary | ICD-10-CM | POA: Diagnosis not present

## 2013-03-06 DIAGNOSIS — L570 Actinic keratosis: Secondary | ICD-10-CM | POA: Diagnosis not present

## 2013-03-15 DIAGNOSIS — B351 Tinea unguium: Secondary | ICD-10-CM | POA: Diagnosis not present

## 2013-03-15 DIAGNOSIS — I70209 Unspecified atherosclerosis of native arteries of extremities, unspecified extremity: Secondary | ICD-10-CM | POA: Diagnosis not present

## 2013-03-15 DIAGNOSIS — L851 Acquired keratosis [keratoderma] palmaris et plantaris: Secondary | ICD-10-CM | POA: Diagnosis not present

## 2013-03-19 DIAGNOSIS — R300 Dysuria: Secondary | ICD-10-CM | POA: Diagnosis not present

## 2013-03-19 DIAGNOSIS — R339 Retention of urine, unspecified: Secondary | ICD-10-CM | POA: Diagnosis not present

## 2013-03-19 DIAGNOSIS — N952 Postmenopausal atrophic vaginitis: Secondary | ICD-10-CM | POA: Diagnosis not present

## 2013-03-19 DIAGNOSIS — R350 Frequency of micturition: Secondary | ICD-10-CM | POA: Diagnosis not present

## 2013-03-19 DIAGNOSIS — R3915 Urgency of urination: Secondary | ICD-10-CM | POA: Diagnosis not present

## 2013-03-19 DIAGNOSIS — N318 Other neuromuscular dysfunction of bladder: Secondary | ICD-10-CM | POA: Diagnosis not present

## 2013-03-19 DIAGNOSIS — R3129 Other microscopic hematuria: Secondary | ICD-10-CM | POA: Diagnosis not present

## 2013-03-22 ENCOUNTER — Ambulatory Visit: Payer: Medicare Other | Admitting: Internal Medicine

## 2013-03-26 ENCOUNTER — Ambulatory Visit: Payer: Medicare Other | Admitting: Internal Medicine

## 2013-04-09 ENCOUNTER — Ambulatory Visit (INDEPENDENT_AMBULATORY_CARE_PROVIDER_SITE_OTHER): Payer: Medicare Other | Admitting: Internal Medicine

## 2013-04-09 ENCOUNTER — Encounter: Payer: Self-pay | Admitting: Internal Medicine

## 2013-04-09 VITALS — BP 116/72 | HR 70 | Temp 98.2°F | Ht 70.0 in | Wt 195.2 lb

## 2013-04-09 DIAGNOSIS — K219 Gastro-esophageal reflux disease without esophagitis: Secondary | ICD-10-CM | POA: Diagnosis not present

## 2013-04-09 DIAGNOSIS — K5900 Constipation, unspecified: Secondary | ICD-10-CM | POA: Diagnosis not present

## 2013-04-09 DIAGNOSIS — R634 Abnormal weight loss: Secondary | ICD-10-CM | POA: Diagnosis not present

## 2013-04-09 NOTE — Patient Instructions (Signed)
Continue Dexilant 60 mg daily  [facility] magic mouthwash as directed - will call in prescription   Return one stool sample for occult blood testing  Further recommendations to follow

## 2013-04-09 NOTE — Progress Notes (Signed)
Primary Care Physician:  Estanislado Pandy, MD Primary Gastroenterologist:  Dr. Jena Gauss  Pre-Procedure History & Physical: HPI:  [name] is a 79 y.o. female here for GERD and chronic constipation. GERD symptoms well controlled on Dexilant 60 mg orally daily. No dysphagia. Chronic constipation controlled with Ducalax/  MiraLax occasionally. Last colonoscopy 2006-diverticulosis. Will be due for screening examination 2016, health permitting.  Just recently took Zyvox for bladder infection prescribed and Dr. Ian Bushman in [place]. It has helped however it she has got a very inflamed tongue and throat and wants me to look at it today.  I note a 13 pound weight loss in the past one year based on her scales. Patient states she doesn't use much as she used to previously but denies abdominal pain, melena, hematochezia  Or early satiety. She has hypothyroidism and is on supplementation. She is to see Dr. Neita Carp in followup next month.  Past Medical History  Diagnosis Date  . Syncope     Neurocardiogenic - positive tilt table test, on Florinef  . Allergy to radiographic dye   . Diverticulosis of colon (without mention of hemorrhage)     TCS by Dr. Jena Gauss  . Irritable bowel syndrome   . GERD (gastroesophageal reflux disease)   . Peripheral polyneuropathy     Confirmed by EMG and nerve conduction velocities   . Migraine   . Pulmonary nodule   . Mitral regurgitation   . Hemorrhoids, internal     TCS by Dr. Jena Gauss  . Chronic diastolic heart failure     Past Surgical History  Procedure Laterality Date  . Wrist surgery    . Foot surgery    . Total abdominal hysterectomy    . Appendectomy    . Back surgery    . Back pain      going to chiropracator for back and hip pain  . Colonoscopy  06/16/2005    internal hemorrhoids, L side diverticula - Dr. Jena Gauss  . Esophagogastroduodenoscopy  12/21/2006      Dr. Elmer Ramp esophagus/Patulous EG junction, small to moderate sized hiatal hernia,    multiple fundal gland type appearing polyps biopsied, otherwise normal    Prior to Admission medications   Medication Sig Start Date End Date Taking? Authorizing Provider  aspirin EC 81 MG tablet Take 1 tablet (81 mg total) by mouth daily. 09/12/12  Yes Prescott Parma, PA-C  Cholecalciferol (VITAMIN D-3) 1000 UNITS CAPS Take 1 capsule by mouth daily.   Yes Historical Provider, MD  Cranberry-Vitamin C-Vitamin E (CRANBERRY PLUS VITAMIN C) 4200-20-3 MG-MG-UNIT CAPS Take 1 capsule by mouth daily.   Yes Historical Provider, MD  dexlansoprazole (DEXILANT) 60 MG capsule Take 1 capsule (60 mg total) by mouth daily. 10/01/12  Yes Nira Retort, NP  docusate sodium (COLACE) 100 MG capsule Take 100 mg by mouth. Takes 1-3 x week   Yes Historical Provider, MD  estradiol (ESTRACE) 0.1 MG/GM vaginal cream Place 0.5 g vaginally 2 (two) times a week.   Yes Historical Provider, MD  fludrocortisone (FLORINEF) 0.1 MG tablet Take 1 tablet (0.1 mg total) by mouth 3 (three) times a week. 09/17/12  Yes Prescott Parma, PA-C  fluticasone (FLONASE) 50 MCG/ACT nasal spray Place 2 sprays into the nose as needed.     Yes Historical Provider, MD  furosemide (LASIX) 20 MG tablet Take 1 tablet (20 mg total) by mouth daily. 11/22/12  Yes Prescott Parma, PA-C  gabapentin (NEURONTIN) 100 MG capsule Take 200 mg by mouth as directed.  Yes Historical Provider, MD  Glucosamine-Chondroitin-MSM [phone] MG TABS Take 1 tablet by mouth daily.   Yes Historical Provider, MD  Lactobacillus (ACIDOPHILUS) 100 MG CAPS Take 100 mg by mouth daily.   Yes Historical Provider, MD  levothyroxine (SYNTHROID) 50 MCG tablet Take 75 mcg by mouth daily.    Yes Historical Provider, MD  LIDOCAINE-HYDROCORTISONE ACE EX Apply 1 application topically as needed.   Yes Historical Provider, MD  linezolid (ZYVOX) 600 MG tablet Take 600 mg by mouth 2 (two) times daily. Last dose was today   Yes Historical Provider, MD  montelukast (SINGULAIR) 10 MG tablet Take 10 mg by  mouth daily.     Yes Historical Provider, MD  Multiple Vitamins-Minerals ([employer]S SPECTRAVITE SENIOR) TABS Take 1 tablet by mouth daily.     Yes Historical Provider, MD  nitroGLYCERIN (NITROSTAT) 0.4 MG SL tablet Place 1 tablet (0.4 mg total) under the tongue every 5 (five) minutes as needed. 09/12/12  Yes Prescott Parma, PA-C  Probiotic Product (ALIGN PO) Take by mouth daily.     Yes Historical Provider, MD  solifenacin (VESICARE) 10 MG tablet Take 10 mg by mouth daily.     Yes Historical Provider, MD  topiramate (TOPAMAX) 50 MG tablet Take 100 mg by mouth every morning. & 1 at night   Yes Historical Provider, MD  vitamin C (ASCORBIC ACID) 500 MG tablet Take 1,000 mg by mouth daily.    Yes Historical Provider, MD  vitamin E 400 UNIT capsule Take 400 Units by mouth daily.   Yes Historical Provider, MD    Allergies as of 04/09/2013 - Review Complete 04/09/2013  Allergen Reaction Noted  . Cephalexin  02/25/2009  . Codeine  02/25/2009  . Indomethacin  02/25/2009  . Levofloxacin  02/25/2009  . Morphine  02/25/2009  . Sulfamethoxazole w-trimethoprim    . Penicillins Rash 02/25/2009    Family History  Problem Relation Age of Onset  . Colon cancer Neg Hx     History   Social History  . Marital Status: Widowed    Spouse Name: N/A    Number of Children: N/A  . Years of Education: N/A   Occupational History  . Not on file.   Social History Main Topics  . Smoking status: Never Smoker   . Smokeless tobacco: Never Used     Comment: tobacco use - no  . Alcohol Use: No  . Drug Use: Not on file  . Sexually Active: Not on file   Other Topics Concern  . Not on file   Social History Narrative   Retired, widowed, gets regular exercise.     Review of Systems: See HPI, otherwise negative ROS  Physical Exam: BP 116/72  Pulse 70  Temp(Src) 98.2 F (36.8 C) (Oral)  Ht 5\' 10"  (1.778 m)  Wt 195 lb 3.2 oz (88.542 kg)  BMI 28.01 kg/m2 General:   Alert,  Well-developed, well-nourished,  pleasant and cooperative in NAD Skin:  Intact without significant lesions or rashes. Eyes:  Sclera clear, no icterus.   Conjunctiva pink. Ears:  Normal auditory acuity. Nose:  No deformity, discharge,  or lesions. Mouth:  Somewhat "geographic" appearing tongue. Tongue is injected. There is no plaquing or thrush apparent. No ulceration seen. Neck:  Supple; no masses or thyromegaly. No significant cervical adenopathy. Lungs:  Clear throughout to auscultation.   No wheezes, crackles, or rhonchi. No acute distress. Heart:  Regular rate and rhythm; no murmurs, clicks, rubs,  or gallops. Abdomen: Non-distended, normal bowel sounds.  Soft and nontender without appreciable mass or hepatosplenomegaly.  Pulses:  Normal pulses noted. Extremities:  Without clubbing or edema.  Impression/Plan:  GERD symptoms well controlled on Dexilant.  Constipation well managed with Ducalax/MiraLax.  13 pound weight loss of uncertain significance. From a GI standpoint, she is doing well. Not mentioned above, occult blood testing on stool negative last year. Due for screening colonoscopy 2016, health permitting.   Recommendations: stool for occult blood testing today. Continue Dexilant 60 mg daily. I feel the benefits outweigh the risks. Likely patient has oral fungal infection after antibiotics. We'll give her a five-day course of Dukes Magic mouthwash 10 cc 4 times a day. Follow up with Dr. Neita Carp as planned next month.

## 2013-04-11 ENCOUNTER — Ambulatory Visit (INDEPENDENT_AMBULATORY_CARE_PROVIDER_SITE_OTHER): Payer: Medicare Other | Admitting: Internal Medicine

## 2013-04-11 DIAGNOSIS — K5900 Constipation, unspecified: Secondary | ICD-10-CM | POA: Diagnosis not present

## 2013-04-12 ENCOUNTER — Other Ambulatory Visit: Payer: Self-pay | Admitting: Internal Medicine

## 2013-04-12 MED ORDER — PEG 3350-KCL-NA BICARB-NACL 420 G PO SOLR: 4000.0000 mL | ORAL | Status: DC

## 2013-04-12 NOTE — Progress Notes (Signed)
I have patient scheduled for 5/15 w/RMR and Im waiting for the Nurse to speak to her first so I can give her instructions

## 2013-04-24 ENCOUNTER — Encounter (HOSPITAL_COMMUNITY): Payer: Self-pay | Admitting: Pharmacy Technician

## 2013-04-24 ENCOUNTER — Other Ambulatory Visit: Payer: Self-pay | Admitting: Internal Medicine

## 2013-04-24 DIAGNOSIS — R634 Abnormal weight loss: Secondary | ICD-10-CM

## 2013-04-24 DIAGNOSIS — K5900 Constipation, unspecified: Secondary | ICD-10-CM

## 2013-04-29 DIAGNOSIS — M19039 Primary osteoarthritis, unspecified wrist: Secondary | ICD-10-CM | POA: Diagnosis not present

## 2013-05-02 ENCOUNTER — Encounter (HOSPITAL_COMMUNITY)
Admission: RE | Disposition: A | Discharge status: Home / Self Care | Payer: Self-pay | Source: Ambulatory Visit | Attending: Internal Medicine

## 2013-05-02 ENCOUNTER — Encounter (HOSPITAL_COMMUNITY): Payer: Self-pay | Admitting: *Deleted

## 2013-05-02 ENCOUNTER — Ambulatory Visit (HOSPITAL_COMMUNITY)
Admission: RE | Admit: 2013-05-02 | Discharge: 2013-05-02 | Disposition: A | Discharge status: Home / Self Care | Payer: Medicare Other | Source: Ambulatory Visit | Attending: Internal Medicine | Admitting: Internal Medicine

## 2013-05-02 DIAGNOSIS — R634 Abnormal weight loss: Secondary | ICD-10-CM | POA: Diagnosis not present

## 2013-05-02 DIAGNOSIS — K5900 Constipation, unspecified: Secondary | ICD-10-CM

## 2013-05-02 DIAGNOSIS — K5730 Diverticulosis of large intestine without perforation or abscess without bleeding: Secondary | ICD-10-CM | POA: Insufficient documentation

## 2013-05-02 DIAGNOSIS — Z881 Allergy status to other antibiotic agents status: Secondary | ICD-10-CM | POA: Insufficient documentation

## 2013-05-02 DIAGNOSIS — Z880 Allergy status to penicillin: Secondary | ICD-10-CM | POA: Diagnosis not present

## 2013-05-02 DIAGNOSIS — Z882 Allergy status to sulfonamides status: Secondary | ICD-10-CM | POA: Diagnosis not present

## 2013-05-02 DIAGNOSIS — D126 Benign neoplasm of colon, unspecified: Secondary | ICD-10-CM

## 2013-05-02 DIAGNOSIS — K589 Irritable bowel syndrome without diarrhea: Secondary | ICD-10-CM | POA: Insufficient documentation

## 2013-05-02 DIAGNOSIS — Z883 Allergy status to other anti-infective agents status: Secondary | ICD-10-CM | POA: Insufficient documentation

## 2013-05-02 DIAGNOSIS — Z885 Allergy status to narcotic agent status: Secondary | ICD-10-CM | POA: Diagnosis not present

## 2013-05-02 DIAGNOSIS — E039 Hypothyroidism, unspecified: Secondary | ICD-10-CM | POA: Insufficient documentation

## 2013-05-02 DIAGNOSIS — I059 Rheumatic mitral valve disease, unspecified: Secondary | ICD-10-CM | POA: Insufficient documentation

## 2013-05-02 DIAGNOSIS — Z7982 Long term (current) use of aspirin: Secondary | ICD-10-CM | POA: Insufficient documentation

## 2013-05-02 DIAGNOSIS — K219 Gastro-esophageal reflux disease without esophagitis: Secondary | ICD-10-CM | POA: Insufficient documentation

## 2013-05-02 DIAGNOSIS — Z91041 Radiographic dye allergy status: Secondary | ICD-10-CM | POA: Insufficient documentation

## 2013-05-02 DIAGNOSIS — I5032 Chronic diastolic (congestive) heart failure: Secondary | ICD-10-CM | POA: Diagnosis not present

## 2013-05-02 DIAGNOSIS — Z79899 Other long term (current) drug therapy: Secondary | ICD-10-CM | POA: Insufficient documentation

## 2013-05-02 DIAGNOSIS — R195 Other fecal abnormalities: Secondary | ICD-10-CM

## 2013-05-02 DIAGNOSIS — Z886 Allergy status to analgesic agent status: Secondary | ICD-10-CM | POA: Diagnosis not present

## 2013-05-02 HISTORY — PX: COLONOSCOPY: SHX5424

## 2013-05-02 SURGERY — COLONOSCOPY
Anesthesia: Moderate Sedation

## 2013-05-02 MED ORDER — ONDANSETRON HCL 4 MG/2ML IJ SOLN
INTRAMUSCULAR | Status: AC
  Filled 2013-05-02: qty 2

## 2013-05-02 MED ORDER — ONDANSETRON HCL 4 MG/2ML IJ SOLN
INTRAMUSCULAR | Status: DC | PRN
  Administered 2013-05-02: 4 mg via INTRAVENOUS

## 2013-05-02 MED ORDER — MEPERIDINE HCL 100 MG/ML IJ SOLN
INTRAMUSCULAR | Status: DC | PRN
  Administered 2013-05-02: 25 mg via INTRAVENOUS
  Administered 2013-05-02: 50 mg via INTRAVENOUS

## 2013-05-02 MED ORDER — MEPERIDINE HCL 100 MG/ML IJ SOLN
INTRAMUSCULAR | Status: AC
  Filled 2013-05-02: qty 2

## 2013-05-02 MED ORDER — SODIUM CHLORIDE 0.9 % IV SOLN
INTRAVENOUS | Status: DC
  Administered 2013-05-02: 10:00:00 via INTRAVENOUS

## 2013-05-02 MED ORDER — MIDAZOLAM HCL 5 MG/5ML IJ SOLN
INTRAMUSCULAR | Status: AC
  Filled 2013-05-02: qty 10

## 2013-05-02 MED ORDER — STERILE WATER FOR IRRIGATION IR SOLN
Status: DC | PRN
  Administered 2013-05-02: 11:00:00

## 2013-05-02 MED ORDER — MIDAZOLAM HCL 5 MG/5ML IJ SOLN
INTRAMUSCULAR | Status: DC | PRN
  Administered 2013-05-02: 2 mg via INTRAVENOUS
  Administered 2013-05-02 (×2): 1 mg via INTRAVENOUS

## 2013-05-02 NOTE — Interval H&P Note (Signed)
History and Physical Interval Note:  05/02/2013 10:34 AM  [name]  has presented today for surgery, with the diagnosis of weight loss and constipation  The various methods of treatment have been discussed with the patient and family. After consideration of risks, benefits and other options for treatment, the patient has consented to  Procedure(s) with comments: COLONOSCOPY (N/A) - 8:15-moved to [address] to notify pt as a surgical intervention .  The patient's history has been reviewed, patient examined, no change in status, stable for surgery.  I have reviewed the patient's chart and labs.  Questions were answered to the patient's satisfaction.     Stool occult blood positive. Colonoscopy today per plan.The risks, benefits, limitations, alternatives and imponderables have been reviewed with the patient. Questions have been answered. All parties are agreeable.   Eula Listen

## 2013-05-02 NOTE — Op Note (Signed)
[facility] [address] [place], [address]   COLONOSCOPY PROCEDURE REPORT  PATIENT: Diana, Collins  MR#:         [id number] BIRTHDATE: 07-09-1930 , 83  yrs. old GENDER: Female ENDOSCOPIST: R.  Roetta Sessions, MD FACP FACG REFERRED BY:  Fara Chute, M.D. PROCEDURE DATE:  05/02/2013 PROCEDURE:     Ileocolonoscopy biopsy and snare polypectomy  INDICATIONS: Hemoccult-positive stool  INFORMED CONSENT:  The risks, benefits, alternatives and imponderables including but not limited to bleeding, perforation as well as the possibility of a missed lesion have been reviewed.  The potential for biopsy, lesion removal, etc. have also been discussed.  Questions have been answered.  All parties agreeable. Please see the history and physical in the medical record for more information.  MEDICATIONS: Versed 4 mg IV and Demerol 75 mg IV in divided doses. Zofran 4 mg IV  DESCRIPTION OF PROCEDURE:  After a digital rectal exam was performed, the EC-[id number] ([id number])  colonoscope was advanced from the anus through the rectum and colon to the area of the cecum, ileocecal valve and appendiceal orifice.  The cecum was deeply intubated.  These structures were well-seen and photographed for the record.  From the level of the cecum and ileocecal valve, the scope was slowly and cautiously withdrawn.  The mucosal surfaces were carefully surveyed utilizing scope tip deflection to facilitate fold flattening as needed.  The scope was pulled down into the rectum where a thorough examination including retroflexion was performed.    FINDINGS:  Adequate preparation. Normal rectum. Scattered left-sided diverticula; (1) diminutive polyp at the hepatic flexure; (1) 3 m polyp at the splenic flexure and (1) diminutive polyp in the mid descending segment; otherwise, the remainder of colonic mucosa appeared normal.  The distal 5 cm of terminal ileal mucosa also appeared normal.  THERAPEUTIC /  DIAGNOSTIC MANEUVERS PERFORMED:  The above-mentioned polyps were cold biopsied/removed, cold snare removed and cold/biopsy removed, respectively.  COMPLICATIONS: None  CECAL WITHDRAWAL TIME:  10 minutes  IMPRESSION:  Colonic diverticulosis. Colonic polyps-removed as described above  RECOMMENDATIONS: Followup on pathology.   _______________________________ eSigned:  R. Roetta Sessions, MD FACP [facility] 05/02/2013 11:24 AM   CC:    PATIENT NAME:  Diana, Collins MR#: [id number]

## 2013-05-02 NOTE — H&P (View-Only) (Signed)
Primary Care Physician:  SASSER,PAUL W, MD Primary Gastroenterologist:  Dr. Rourk  Pre-Procedure History & Physical: HPI:  [name] is a 79 y.o. female here for GERD and chronic constipation. GERD symptoms well controlled on Dexilant 60 mg orally daily. No dysphagia. Chronic constipation controlled with Ducalax/  MiraLax occasionally. Last colonoscopy 2006-diverticulosis. Will be due for screening examination 2016, health permitting.  Just recently took Zyvox for bladder infection prescribed and Dr. Hurt in [place]. It has helped however it she has got a very inflamed tongue and throat and wants me to look at it today.  I note a 13 pound weight loss in the past one year based on her scales. Patient states she doesn't use much as she used to previously but denies abdominal pain, melena, hematochezia  Or early satiety. She has hypothyroidism and is on supplementation. She is to see Dr. Sasser in followup next month.  Past Medical History  Diagnosis Date  . Syncope     Neurocardiogenic - positive tilt table test, on Florinef  . Allergy to radiographic dye   . Diverticulosis of colon (without mention of hemorrhage)     TCS by Dr. Rourk  . Irritable bowel syndrome   . GERD (gastroesophageal reflux disease)   . Peripheral polyneuropathy     Confirmed by EMG and nerve conduction velocities   . Migraine   . Pulmonary nodule   . Mitral regurgitation   . Hemorrhoids, internal     TCS by Dr. Rourk  . Chronic diastolic heart failure     Past Surgical History  Procedure Laterality Date  . Wrist surgery    . Foot surgery    . Total abdominal hysterectomy    . Appendectomy    . Back surgery    . Back pain      going to chiropracator for back and hip pain  . Colonoscopy  06/16/2005    internal hemorrhoids, L side diverticula - Dr. Rourk  . Esophagogastroduodenoscopy  12/21/2006      Dr. Rourk-Normal esophagus/Patulous EG junction, small to moderate sized hiatal hernia,    multiple fundal gland type appearing polyps biopsied, otherwise normal    Prior to Admission medications   Medication Sig Start Date End Date Taking? Authorizing Provider  aspirin EC 81 MG tablet Take 1 tablet (81 mg total) by mouth daily. 09/12/12  Yes Eugene Serpe, PA-C  Cholecalciferol (VITAMIN D-3) 1000 UNITS CAPS Take 1 capsule by mouth daily.   Yes Historical Provider, MD  Cranberry-Vitamin C-Vitamin E (CRANBERRY PLUS VITAMIN C) 4200-20-3 MG-MG-UNIT CAPS Take 1 capsule by mouth daily.   Yes Historical Provider, MD  dexlansoprazole (DEXILANT) 60 MG capsule Take 1 capsule (60 mg total) by mouth daily. 10/01/12  Yes Anna W Sams, NP  docusate sodium (COLACE) 100 MG capsule Take 100 mg by mouth. Takes 1-3 x week   Yes Historical Provider, MD  estradiol (ESTRACE) 0.1 MG/GM vaginal cream Place 0.5 g vaginally 2 (two) times a week.   Yes Historical Provider, MD  fludrocortisone (FLORINEF) 0.1 MG tablet Take 1 tablet (0.1 mg total) by mouth 3 (three) times a week. 09/17/12  Yes Eugene Serpe, PA-C  fluticasone (FLONASE) 50 MCG/ACT nasal spray Place 2 sprays into the nose as needed.     Yes Historical Provider, MD  furosemide (LASIX) 20 MG tablet Take 1 tablet (20 mg total) by mouth daily. 11/22/12  Yes Eugene Serpe, PA-C  gabapentin (NEURONTIN) 100 MG capsule Take 200 mg by mouth as directed.     Yes Historical Provider, MD  Glucosamine-Chondroitin-MSM 750-400-375 MG TABS Take 1 tablet by mouth daily.   Yes Historical Provider, MD  Lactobacillus (ACIDOPHILUS) 100 MG CAPS Take 100 mg by mouth daily.   Yes Historical Provider, MD  levothyroxine (SYNTHROID) 50 MCG tablet Take 75 mcg by mouth daily.    Yes Historical Provider, MD  LIDOCAINE-HYDROCORTISONE ACE EX Apply 1 application topically as needed.   Yes Historical Provider, MD  linezolid (ZYVOX) 600 MG tablet Take 600 mg by mouth 2 (two) times daily. Last dose was today   Yes Historical Provider, MD  montelukast (SINGULAIR) 10 MG tablet Take 10 mg by  mouth daily.     Yes Historical Provider, MD  Multiple Vitamins-Minerals (CVS SPECTRAVITE SENIOR) TABS Take 1 tablet by mouth daily.     Yes Historical Provider, MD  nitroGLYCERIN (NITROSTAT) 0.4 MG SL tablet Place 1 tablet (0.4 mg total) under the tongue every 5 (five) minutes as needed. 09/12/12  Yes Eugene Serpe, PA-C  Probiotic Product (ALIGN PO) Take by mouth daily.     Yes Historical Provider, MD  solifenacin (VESICARE) 10 MG tablet Take 10 mg by mouth daily.     Yes Historical Provider, MD  topiramate (TOPAMAX) 50 MG tablet Take 100 mg by mouth every morning. & 1 at night   Yes Historical Provider, MD  vitamin C (ASCORBIC ACID) 500 MG tablet Take 1,000 mg by mouth daily.    Yes Historical Provider, MD  vitamin E 400 UNIT capsule Take 400 Units by mouth daily.   Yes Historical Provider, MD    Allergies as of 04/09/2013 - Review Complete 04/09/2013  Allergen Reaction Noted  . Cephalexin  02/25/2009  . Codeine  02/25/2009  . Indomethacin  02/25/2009  . Levofloxacin  02/25/2009  . Morphine  02/25/2009  . Sulfamethoxazole w-trimethoprim    . Penicillins Rash 02/25/2009    Family History  Problem Relation Age of Onset  . Colon cancer Neg Hx     History   Social History  . Marital Status: Widowed    Spouse Name: N/A    Number of Children: N/A  . Years of Education: N/A   Occupational History  . Not on file.   Social History Main Topics  . Smoking status: Never Smoker   . Smokeless tobacco: Never Used     Comment: tobacco use - no  . Alcohol Use: No  . Drug Use: Not on file  . Sexually Active: Not on file   Other Topics Concern  . Not on file   Social History Narrative   Retired, widowed, gets regular exercise.     Review of Systems: See HPI, otherwise negative ROS  Physical Exam: BP 116/72  Pulse 70  Temp(Src) 98.2 F (36.8 C) (Oral)  Ht 5' 10" (1.778 m)  Wt 195 lb 3.2 oz (88.542 kg)  BMI 28.01 kg/m2 General:   Alert,  Well-developed, well-nourished,  pleasant and cooperative in NAD Skin:  Intact without significant lesions or rashes. Eyes:  Sclera clear, no icterus.   Conjunctiva pink. Ears:  Normal auditory acuity. Nose:  No deformity, discharge,  or lesions. Mouth:  Somewhat "geographic" appearing tongue. Tongue is injected. There is no plaquing or thrush apparent. No ulceration seen. Neck:  Supple; no masses or thyromegaly. No significant cervical adenopathy. Lungs:  Clear throughout to auscultation.   No wheezes, crackles, or rhonchi. No acute distress. Heart:  Regular rate and rhythm; no murmurs, clicks, rubs,  or gallops. Abdomen: Non-distended, normal bowel sounds.    Soft and nontender without appreciable mass or hepatosplenomegaly.  Pulses:  Normal pulses noted. Extremities:  Without clubbing or edema.  Impression/Plan:  GERD symptoms well controlled on Dexilant.  Constipation well managed with Ducalax/MiraLax.  13 pound weight loss of uncertain significance. From a GI standpoint, she is doing well. Not mentioned above, occult blood testing on stool negative last year. Due for screening colonoscopy 2016, health permitting.   Recommendations: stool for occult blood testing today. Continue Dexilant 60 mg daily. I feel the benefits outweigh the risks. Likely patient has oral fungal infection after antibiotics. We'll give her a five-day course of Dukes Magic mouthwash 10 cc 4 times a day. Follow up with Dr. Sasser as planned next month. 

## 2013-05-03 ENCOUNTER — Encounter (HOSPITAL_COMMUNITY): Payer: Self-pay | Admitting: Internal Medicine

## 2013-05-04 ENCOUNTER — Encounter: Payer: Self-pay | Admitting: Internal Medicine

## 2013-05-22 DIAGNOSIS — R300 Dysuria: Secondary | ICD-10-CM | POA: Diagnosis not present

## 2013-05-22 DIAGNOSIS — G619 Inflammatory polyneuropathy, unspecified: Secondary | ICD-10-CM | POA: Diagnosis not present

## 2013-05-22 DIAGNOSIS — E669 Obesity, unspecified: Secondary | ICD-10-CM | POA: Diagnosis not present

## 2013-05-22 DIAGNOSIS — K2100 Gastro-esophageal reflux disease with esophagitis, without bleeding: Secondary | ICD-10-CM | POA: Diagnosis not present

## 2013-05-22 DIAGNOSIS — E039 Hypothyroidism, unspecified: Secondary | ICD-10-CM | POA: Diagnosis not present

## 2013-05-22 DIAGNOSIS — R5381 Other malaise: Secondary | ICD-10-CM | POA: Diagnosis not present

## 2013-05-22 DIAGNOSIS — R609 Edema, unspecified: Secondary | ICD-10-CM | POA: Diagnosis not present

## 2013-05-22 DIAGNOSIS — R634 Abnormal weight loss: Secondary | ICD-10-CM | POA: Diagnosis not present

## 2013-05-22 DIAGNOSIS — R5383 Other fatigue: Secondary | ICD-10-CM | POA: Diagnosis not present

## 2013-05-28 DIAGNOSIS — N390 Urinary tract infection, site not specified: Secondary | ICD-10-CM | POA: Diagnosis not present

## 2013-05-29 DIAGNOSIS — Z01419 Encounter for gynecological examination (general) (routine) without abnormal findings: Secondary | ICD-10-CM | POA: Diagnosis not present

## 2013-05-29 DIAGNOSIS — K219 Gastro-esophageal reflux disease without esophagitis: Secondary | ICD-10-CM | POA: Diagnosis not present

## 2013-05-29 DIAGNOSIS — N3010 Interstitial cystitis (chronic) without hematuria: Secondary | ICD-10-CM | POA: Diagnosis not present

## 2013-05-29 DIAGNOSIS — E039 Hypothyroidism, unspecified: Secondary | ICD-10-CM | POA: Diagnosis not present

## 2013-05-29 DIAGNOSIS — G63 Polyneuropathy in diseases classified elsewhere: Secondary | ICD-10-CM | POA: Diagnosis not present

## 2013-06-14 ENCOUNTER — Other Ambulatory Visit: Payer: Self-pay | Admitting: Neurology

## 2013-06-17 DIAGNOSIS — L851 Acquired keratosis [keratoderma] palmaris et plantaris: Secondary | ICD-10-CM | POA: Diagnosis not present

## 2013-06-17 DIAGNOSIS — B351 Tinea unguium: Secondary | ICD-10-CM | POA: Diagnosis not present

## 2013-06-17 DIAGNOSIS — I70209 Unspecified atherosclerosis of native arteries of extremities, unspecified extremity: Secondary | ICD-10-CM | POA: Diagnosis not present

## 2013-06-18 DIAGNOSIS — R351 Nocturia: Secondary | ICD-10-CM | POA: Diagnosis not present

## 2013-06-18 DIAGNOSIS — R350 Frequency of micturition: Secondary | ICD-10-CM | POA: Diagnosis not present

## 2013-06-18 DIAGNOSIS — R3129 Other microscopic hematuria: Secondary | ICD-10-CM | POA: Diagnosis not present

## 2013-06-18 DIAGNOSIS — N390 Urinary tract infection, site not specified: Secondary | ICD-10-CM | POA: Diagnosis not present

## 2013-06-18 DIAGNOSIS — N318 Other neuromuscular dysfunction of bladder: Secondary | ICD-10-CM | POA: Diagnosis not present

## 2013-06-18 DIAGNOSIS — N952 Postmenopausal atrophic vaginitis: Secondary | ICD-10-CM | POA: Diagnosis not present

## 2013-06-25 DIAGNOSIS — R5381 Other malaise: Secondary | ICD-10-CM | POA: Diagnosis not present

## 2013-06-25 DIAGNOSIS — T50995A Adverse effect of other drugs, medicaments and biological substances, initial encounter: Secondary | ICD-10-CM | POA: Diagnosis not present

## 2013-06-25 DIAGNOSIS — R5383 Other fatigue: Secondary | ICD-10-CM | POA: Diagnosis not present

## 2013-06-26 DIAGNOSIS — H903 Sensorineural hearing loss, bilateral: Secondary | ICD-10-CM | POA: Diagnosis not present

## 2013-06-26 DIAGNOSIS — K2100 Gastro-esophageal reflux disease with esophagitis, without bleeding: Secondary | ICD-10-CM | POA: Diagnosis not present

## 2013-07-01 DIAGNOSIS — N390 Urinary tract infection, site not specified: Secondary | ICD-10-CM | POA: Diagnosis not present

## 2013-07-02 DIAGNOSIS — N390 Urinary tract infection, site not specified: Secondary | ICD-10-CM | POA: Diagnosis not present

## 2013-07-03 DIAGNOSIS — N390 Urinary tract infection, site not specified: Secondary | ICD-10-CM | POA: Diagnosis not present

## 2013-07-04 DIAGNOSIS — N390 Urinary tract infection, site not specified: Secondary | ICD-10-CM | POA: Diagnosis not present

## 2013-07-05 DIAGNOSIS — N390 Urinary tract infection, site not specified: Secondary | ICD-10-CM | POA: Diagnosis not present

## 2013-07-07 ENCOUNTER — Other Ambulatory Visit: Payer: Self-pay | Admitting: Physician Assistant

## 2013-07-12 DIAGNOSIS — N390 Urinary tract infection, site not specified: Secondary | ICD-10-CM | POA: Diagnosis not present

## 2013-07-12 DIAGNOSIS — N952 Postmenopausal atrophic vaginitis: Secondary | ICD-10-CM | POA: Diagnosis not present

## 2013-07-12 DIAGNOSIS — R351 Nocturia: Secondary | ICD-10-CM | POA: Diagnosis not present

## 2013-07-12 DIAGNOSIS — N318 Other neuromuscular dysfunction of bladder: Secondary | ICD-10-CM | POA: Diagnosis not present

## 2013-08-05 ENCOUNTER — Ambulatory Visit (INDEPENDENT_AMBULATORY_CARE_PROVIDER_SITE_OTHER): Payer: Medicare Other | Admitting: Neurology

## 2013-08-05 ENCOUNTER — Encounter: Payer: Self-pay | Admitting: Neurology

## 2013-08-05 VITALS — BP 126/74 | HR 57 | Ht 69.0 in | Wt 190.0 lb

## 2013-08-05 DIAGNOSIS — R42 Dizziness and giddiness: Secondary | ICD-10-CM

## 2013-08-05 DIAGNOSIS — G459 Transient cerebral ischemic attack, unspecified: Secondary | ICD-10-CM | POA: Diagnosis not present

## 2013-08-05 DIAGNOSIS — G63 Polyneuropathy in diseases classified elsewhere: Secondary | ICD-10-CM | POA: Diagnosis not present

## 2013-08-05 DIAGNOSIS — R209 Unspecified disturbances of skin sensation: Secondary | ICD-10-CM | POA: Diagnosis not present

## 2013-08-05 DIAGNOSIS — G40309 Generalized idiopathic epilepsy and epileptic syndromes, not intractable, without status epilepticus: Secondary | ICD-10-CM

## 2013-08-05 DIAGNOSIS — R269 Unspecified abnormalities of gait and mobility: Secondary | ICD-10-CM

## 2013-08-05 DIAGNOSIS — Z79899 Other long term (current) drug therapy: Secondary | ICD-10-CM

## 2013-08-05 DIAGNOSIS — G609 Hereditary and idiopathic neuropathy, unspecified: Secondary | ICD-10-CM

## 2013-08-05 MED ORDER — TOPIRAMATE 50 MG PO TABS: 50.0000 mg | ORAL_TABLET | Freq: Two times a day (BID) | ORAL | Status: DC

## 2013-08-05 MED ORDER — GABAPENTIN 100 MG PO CAPS: 100.0000 mg | ORAL_CAPSULE | Freq: Every day | ORAL | Status: DC

## 2013-08-05 MED ORDER — GABAPENTIN 100 MG PO CAPS: 100.0000 mg | ORAL_CAPSULE | Freq: Two times a day (BID) | ORAL | Status: DC

## 2013-08-05 NOTE — Patient Instructions (Signed)
Continue current medications.  Try to do exercises to strengthen your core muscles for stability.  Hold onto something for balance.  Follow up visit in 6 months with Diana Jones, NP

## 2013-08-05 NOTE — Progress Notes (Signed)
[facility]  PATIENT: [name] DOB: 10-Nov-1930   HISTORY FROM: patient REASON FOR VISIT: routine follow up   HISTORICAL  CHIEF COMPLAINT:  Chief Complaint  Patient presents with  . Follow-up    Seizure RV#[id number]    HISTORY OF PRESENT ILLNESS: PRIOR HPI (CM): 79 year old Caucasian female returns today for followup since her last visit on 05/23/12. for  sensory polyneuropathy and syncopal or seizure-like activity. She has had no seizure activity.  Her paresthesias and burning pain in her feet are stable.  She reports having a dark red color to the bottom of her feet. Denies being restless at night but has increasing pain in her feet. She is taking Topamax which she is tolerating well and gabapentin she only takes 200mg  at night due to increase daytime sleepiness.She has not fallen and uses no assistive device. She remains independent in all ADL's. She has had 2 to 3 UTI's in the last few months and on several antibiotics. See ROS.  UPDATE 08/05/13 (LL):  Returns for follow up since last visit on 01/07/13 for sensory polyneuropathy and seizure. She has had no seizure activity.  Her paresthesias and burning pain in her feet are stable.  She reports having a dark red color to the bottom of her feet, redness at the end of the day comes up mid-calf.  Has increasing pain in her feet, feels like her balance is worse some days.  She is taking Topamax which she is tolerating well and gabapentin she only takes 200mg  at night due to increase daytime sleepiness. She has not fallen but stumbles now and uses cane as assistive device sometimes.  Reports doing exercises in bed because she is not stable enough to do them standing.  Had drug reaction to Trimethoprim (hurt all over) and since has had pain in right shoulder;  Spoke to PCP about it.  REVIEW OF SYSTEMS: Full 14 system review of systems performed and notable only for:  Constitutional: N/A  Cardiovascular: N/A    Ear/Nose/Throat: N/A  Skin: N/A  Eyes: N/A  Respiratory: N/A  Gastroitestinal: N/A  Hematology/Lymphatic: N/A  Endocrine: N/A Musculoskeletal:N/A  Allergy/Immunology: N/A  Neurological: N/A Psychiatric: N/A   ALLERGIES: Allergies  Allergen Reactions  . Cephalexin Anaphylaxis and Swelling  . Doxazosin Mesylate Other (See Comments)    Made patient EXTREMELY sick.   . Codeine Other (See Comments)    Hallucinations  . Indomethacin Other (See Comments)    Severe Headache   . Ivp Dye [Iodinated Diagnostic Agents] Hives  . Levofloxacin Other (See Comments)    Unknown  . Morphine Other (See Comments)    Hallucinations   . Sulfamethoxazole W-Trimethoprim Other (See Comments)    Unknown   . Penicillins Other (See Comments)    Made patient go into shock    HOME MEDICATIONS: Outpatient Prescriptions Prior to Visit  Medication Sig Dispense Refill  . aspirin EC 81 MG tablet Take 1 tablet (81 mg total) by mouth daily.      . bisacodyl (BISACODYL) 5 MG EC tablet Take 5 mg by mouth daily as needed for constipation.      . Cholecalciferol (VITAMIN D-3) 1000 UNITS CAPS Take 1 capsule by mouth daily.      . Cranberry-Vitamin C-Vitamin E (CRANBERRY PLUS VITAMIN C) 4200-20-3 MG-MG-UNIT CAPS Take 1 capsule by mouth daily.      Marland Kitchen dexlansoprazole (DEXILANT) 60 MG capsule Take 1 capsule (60 mg total) by mouth daily.  90 capsule  3  .  fludrocortisone (FLORINEF) 0.1 MG tablet TAKE 1 TABLET THREE TIMES A WEEK  36 tablet  2  . fluticasone (FLONASE) 50 MCG/ACT nasal spray Place 2 sprays into the nose as needed for allergies.       . furosemide (LASIX) 20 MG tablet Take 1 tablet (20 mg total) by mouth daily.  90 tablet  3  . Glucosamine-Chondroitin-MSM 750-400-375 MG TABS Take 1 tablet by mouth daily.      Marland Kitchen levothyroxine (SYNTHROID, LEVOTHROID) 75 MCG tablet Take 75 mcg by mouth daily before breakfast.      . montelukast (SINGULAIR) 10 MG tablet Take 10 mg by mouth daily.        . Multiple  Vitamins-Minerals (CVS SPECTRAVITE SENIOR) TABS Take 1 tablet by mouth daily.        . nitroGLYCERIN (NITROSTAT) 0.4 MG SL tablet Place 0.4 mg under the tongue every 5 (five) minutes as needed for chest pain.      . polyethylene glycol-electrolytes (TRILYTE) 420 G solution Take 4,000 mLs by mouth as directed.  4000 mL  0  . Probiotic Product (ALIGN PO) Take 1 tablet by mouth daily.       . vitamin C (ASCORBIC ACID) 500 MG tablet Take 500 mg by mouth daily.       . vitamin E 400 UNIT capsule Take 400 Units by mouth daily.      Marland Kitchen gabapentin (NEURONTIN) 100 MG capsule Take 1 capsule (100 mg total) by mouth 2 (two) times daily. As tolerated  180 capsule  1  . topiramate (TOPAMAX) 50 MG tablet Take 50-100 mg by mouth 2 (two) times daily. Takes 2 tablets in the morning and 1 tablet in the evening.       No facility-administered medications prior to visit.    PAST MEDICAL HISTORY: Past Medical History  Diagnosis Date  . Syncope     Neurocardiogenic - positive tilt table test, on Florinef  . Allergy to radiographic dye   . Diverticulosis of colon (without mention of hemorrhage)     TCS by Dr. Jena Gauss  . Irritable bowel syndrome   . GERD (gastroesophageal reflux disease)   . Peripheral polyneuropathy     Confirmed by EMG and nerve conduction velocities   . Migraine   . Pulmonary nodule   . Mitral regurgitation   . Hemorrhoids, internal     TCS by Dr. Jena Gauss  . Chronic diastolic heart failure     PAST SURGICAL HISTORY: Past Surgical History  Procedure Laterality Date  . Wrist surgery    . Foot surgery    . Total abdominal hysterectomy    . Appendectomy    . Back surgery    . Back pain      going to chiropracator for back and hip pain  . Colonoscopy  06/16/2005    internal hemorrhoids, L side diverticula - Dr. Jena Gauss  . Esophagogastroduodenoscopy  12/21/2006      Dr. Elmer Ramp esophagus/Patulous EG junction, small to moderate sized hiatal hernia,   multiple fundal gland type appearing  polyps biopsied, otherwise normal  . Colonoscopy N/A 05/02/2013    Procedure: COLONOSCOPY;  Surgeon: Corbin Ade, MD;  Location: [facility];  Service: Endoscopy;  Laterality: N/A;  8:15-moved to [address] to notify pt    FAMILY HISTORY: Family History  Problem Relation Age of Onset  . Colon cancer Neg Hx     SOCIAL HISTORY: History   Social History  . Marital Status: Widowed  Spouse Name: N/A    Number of Children: N/A  . Years of Education: N/A   Occupational History  . Not on file.   Social History Main Topics  . Smoking status: Never Smoker   . Smokeless tobacco: Never Used     Comment: tobacco use - no  . Alcohol Use: No  . Drug Use: No  . Sexual Activity: Not on file   Other Topics Concern  . Not on file   Social History Narrative   Retired, widowed, gets regular exercise.      PHYSICAL EXAM  Filed Vitals:   08/05/13 1306  BP: 126/74  Pulse: 57  Height: 5\' 9"  (1.753 m)  Weight: 190 lb (86.183 kg)   Body mass index is 28.05 kg/(m^2).  Physical Exam  General: well developed, well  nourished, seated, in no evident distress Head: head  normocephalic and atraumatic.  Oropharynx benign. Neck: supple with no carotid or supraclavicular bruits. Respiratory: LCA Cardiovascular: RRR no murmurs Musculoskeletal: No deformity Skin: Dark red color to dorsal feet, cool to dorsal surface,  pedal and posterior tibial pulses palpable 1+  Neurologic Exam  Mental Status: Awake and fully alert.  Oriented to place and time.  Follows 1,2, and 3 step commands.  Mood and affect appropriate. Cranial Nerves: Pupils equal, briskly reactive to light.  Extraocular movements full without nystagmus.  Visual fields full to confrontation.  Bil hearing loss.  Facial sensation intact.  Face, tongue, palate move normally and symmetrically.  Neck flexion and extension normal. Motor: Normal bulk and tone.  Normal strength in all tested extremity muscles.  No tremor, no drift.  mild weakness ankle dorsiflexors Sensory: Decreased pinprick in stocking fashion to mid calf, decreased vibratory to ankles on both extremities, position sense normal. Coordination: Rapid alternating movements normal in all extremities.  Finger-to-nose and heel-to-shin performed accurately bilaterally. Gait and Station: Arises from chair without difficulty.  Stance is wide based and upright.  Able to heel, toe with plantar weakness, and unable to perform Tandem.  Romberg negative. Reflexes: 1+ and symmetric upper, depressed at knees, and ankles. Toes downgoing.  DIAGNOSTIC DATA (LABS, IMAGING, TESTING) - I reviewed patient records, labs, notes, testing and imaging myself where available.  ASSESSMENT AND PLAN 79 year old right-handed Caucasian female with Chronic gait abnormality secondary to sensory polyneuropathy. Syncopal versus seizure episode. No episodes since 11/2008.  Plan: Continue Gabapentin but change to 1 cap at night due to complaints of extreme drowsiness on awakening. Can take another cap during the day. Continue Topamax at current dose. Refills sent.  Fall risk=12, be careful with ambulation. Recommend using cane at all times for stability.  Recommend core strengthening exercises standing while holding onto chair.  Vst time 25 min Follow up in 6 months.  Meds ordered this encounter  Medications  . topiramate (TOPAMAX) 50 MG tablet    Sig: Take 1-2 tablets (50-100 mg total) by mouth 2 (two) times daily. Takes 2 tablets in the morning and 1 tablet in the evening.    Dispense:  270 tablet    Refill:  3    Order Specific Question:  Supervising Provider    Answer:  Pearlean Brownie, PRAMOD S [[id number]]  . gabapentin (NEURONTIN) 100 MG capsule    Sig: Take 1 capsule (100 mg total) by mouth 2 (two) times daily. As tolerated    Dispense:  180 capsule    Refill:  3    Order Specific Question:  Supervising Provider    Answer:  SETHI, PRAMOD S [[id number]]    Derrek Puff NP-C 08/05/2013, 5:02  PM  [facility] [address], [address] [place], [place] [address] [phone]

## 2013-08-09 ENCOUNTER — Other Ambulatory Visit: Payer: Self-pay | Admitting: Gastroenterology

## 2013-08-12 ENCOUNTER — Other Ambulatory Visit: Payer: Self-pay

## 2013-08-12 MED ORDER — TOPIRAMATE 50 MG PO TABS: ORAL_TABLET | ORAL | Status: DC

## 2013-08-13 ENCOUNTER — Telehealth: Payer: Self-pay | Admitting: Neurology

## 2013-08-14 NOTE — Telephone Encounter (Signed)
Called  the patient no answer lvm. Asking patient to call back regarding information she wanted on her medication.

## 2013-08-16 DIAGNOSIS — I10 Essential (primary) hypertension: Secondary | ICD-10-CM | POA: Diagnosis not present

## 2013-08-16 DIAGNOSIS — K2100 Gastro-esophageal reflux disease with esophagitis, without bleeding: Secondary | ICD-10-CM | POA: Diagnosis not present

## 2013-08-16 DIAGNOSIS — H903 Sensorineural hearing loss, bilateral: Secondary | ICD-10-CM | POA: Diagnosis not present

## 2013-08-26 DIAGNOSIS — L851 Acquired keratosis [keratoderma] palmaris et plantaris: Secondary | ICD-10-CM | POA: Diagnosis not present

## 2013-08-26 DIAGNOSIS — B351 Tinea unguium: Secondary | ICD-10-CM | POA: Diagnosis not present

## 2013-08-26 DIAGNOSIS — I70209 Unspecified atherosclerosis of native arteries of extremities, unspecified extremity: Secondary | ICD-10-CM | POA: Diagnosis not present

## 2013-08-28 ENCOUNTER — Encounter: Payer: Self-pay | Admitting: Cardiology

## 2013-08-28 ENCOUNTER — Ambulatory Visit (INDEPENDENT_AMBULATORY_CARE_PROVIDER_SITE_OTHER): Payer: Medicare Other | Admitting: Cardiology

## 2013-08-28 VITALS — BP 112/78 | HR 64 | Ht 69.0 in | Wt 186.8 lb

## 2013-08-28 DIAGNOSIS — I340 Nonrheumatic mitral (valve) insufficiency: Secondary | ICD-10-CM

## 2013-08-28 DIAGNOSIS — R55 Syncope and collapse: Secondary | ICD-10-CM

## 2013-08-28 DIAGNOSIS — I5032 Chronic diastolic (congestive) heart failure: Secondary | ICD-10-CM | POA: Diagnosis not present

## 2013-08-28 DIAGNOSIS — I059 Rheumatic mitral valve disease, unspecified: Secondary | ICD-10-CM

## 2013-08-28 NOTE — Patient Instructions (Addendum)

## 2013-08-28 NOTE — Assessment & Plan Note (Signed)
No recurrences.

## 2013-08-28 NOTE — Progress Notes (Signed)
Clinical Summary Ms. Picco is an 79 y.o.female last seen in March. She reports no syncope since last visit. States that she has been compliant with her medications. Weight is down 6 pounds from last visit. She reports occasional ankle edema late in the day.  Echocardiogram from July 2013 revealed mild LVH with LVEF 60-65%, grade 1 diastolic dysfunction, mild mitral regurgitation, moderate tricuspid regurgitation, PA systolic pressure 47 mm mercury.  Interval visit noted with Dr. Pearlean Brownie for followup of sensory polyneuropathy and previous seizure.   Allergies  Allergen Reactions  . Cephalexin Anaphylaxis and Swelling  . Doxazosin Mesylate Other (See Comments)    Made patient EXTREMELY sick.   . Codeine Other (See Comments)    Hallucinations  . Indomethacin Other (See Comments)    Severe Headache   . Ivp Dye [Iodinated Diagnostic Agents] Hives  . Levofloxacin Other (See Comments)    Unknown  . Morphine Other (See Comments)    Hallucinations   . Sulfamethoxazole W-Trimethoprim Other (See Comments)    Unknown   . Penicillins Other (See Comments)    Made patient go into shock    Current Outpatient Prescriptions  Medication Sig Dispense Refill  . aspirin EC 81 MG tablet Take 1 tablet (81 mg total) by mouth daily.      . bisacodyl (BISACODYL) 5 MG EC tablet Take 5 mg by mouth daily as needed for constipation.      . Cholecalciferol (VITAMIN D-3) 1000 UNITS CAPS Take 1 capsule by mouth daily.      . Cranberry-Vitamin C-Vitamin E (CRANBERRY PLUS VITAMIN C) 4200-20-3 MG-MG-UNIT CAPS Take 1 capsule by mouth daily.      Marland Kitchen DEXILANT 60 MG capsule TAKE 1 CAPSULE DAILY  90 capsule  2  . estradiol (ESTRACE) 0.1 MG/GM vaginal cream Place 4 g vaginally daily. Use twice a week      . fludrocortisone (FLORINEF) 0.1 MG tablet TAKE 1 TABLET THREE TIMES A WEEK  36 tablet  2  . fluticasone (FLONASE) 50 MCG/ACT nasal spray Place 2 sprays into the nose as needed for allergies.       . furosemide  (LASIX) 20 MG tablet Take 1 tablet (20 mg total) by mouth daily.  90 tablet  3  . gabapentin (NEURONTIN) 100 MG capsule Take 1 capsule (100 mg total) by mouth 2 (two) times daily. As tolerated  180 capsule  3  . Glucosamine-Chondroitin-MSM 750-400-375 MG TABS Take 1 tablet by mouth daily.      . Hypromellose 0.3 % SOLN Apply to eye. Applies to both eyes daily      . levothyroxine (SYNTHROID, LEVOTHROID) 75 MCG tablet Take 75 mcg by mouth daily before breakfast.      . montelukast (SINGULAIR) 10 MG tablet Take 10 mg by mouth daily.        . Multiple Vitamins-Minerals (CVS SPECTRAVITE SENIOR) TABS Take 1 tablet by mouth daily.        . nitrofurantoin (MACRODANTIN) 50 MG capsule Take 50 mg by mouth at bedtime.      . nitroGLYCERIN (NITROSTAT) 0.4 MG SL tablet Place 0.4 mg under the tongue every 5 (five) minutes as needed for chest pain.      . polyethylene glycol-electrolytes (TRILYTE) 420 G solution Take 4,000 mLs by mouth as directed.  4000 mL  0  . Probiotic Product (ALIGN PO) Take 1 tablet by mouth daily.       Marland Kitchen topiramate (TOPAMAX) 50 MG tablet Takes 2 tablets in the morning and  1 tablet in the evening.  270 tablet  3  . vitamin C (ASCORBIC ACID) 500 MG tablet Take 500 mg by mouth daily.       . vitamin E 400 UNIT capsule Take 400 Units by mouth daily.       No current facility-administered medications for this visit.    Past Medical History  Diagnosis Date  . Syncope     Neurocardiogenic - positive tilt table test, on Florinef  . Allergy to radiographic dye   . Diverticulosis of colon (without mention of hemorrhage)     TCS by Dr. Jena Gauss  . Irritable bowel syndrome   . GERD (gastroesophageal reflux disease)   . Peripheral polyneuropathy     Confirmed by EMG and nerve conduction velocities   . Migraine   . Pulmonary nodule   . Mitral regurgitation   . Hemorrhoids, internal     TCS by Dr. Jena Gauss  . Chronic diastolic heart failure     Social History Ms. Crompton reports that she  has never smoked. She has never used smokeless tobacco. Ms. Arai reports that she does not drink alcohol.  Review of Systems Occasional shortness of breath, not progressive. No orthopnea or PND. No angina. Otherwise negative.  Physical Examination Filed Vitals:   08/28/13 1356  BP: 112/78  Pulse: 64   Filed Weights   08/28/13 1356  Weight: 186 lb 12.8 oz (84.732 kg)    No acute distress.  HEENT: Conjunctiva and lids normal, oropharynx clear.  Neck: Supple, no elevated JVP or carotid bruits, no thyromegaly.  Lungs: Clear to auscultation, nonlabored breathing at rest.  Cardiac: Regular rate and rhythm, no S3.  Abdomen: Soft, nontender,bowel sounds present.  Extremities: No pitting edema, distal pulses 2+.   Problem List and Plan   Chronic diastolic heart failure She appears euvolemic today. Blood pressure and heart rate are well controlled. I made no specific changes to her medications.  Vasovagal syncope No recurrences.  Mitral regurgitation Mild by echocardiogram from last year. No change in examination.    Jonelle Sidle, M.D., F.A.C.C.

## 2013-08-28 NOTE — Assessment & Plan Note (Signed)
Mild by echocardiogram from last year. No change in examination.

## 2013-08-28 NOTE — Assessment & Plan Note (Signed)
She appears euvolemic today. Blood pressure and heart rate are well controlled. I made no specific changes to her medications.

## 2013-09-23 DIAGNOSIS — R5381 Other malaise: Secondary | ICD-10-CM | POA: Diagnosis not present

## 2013-09-23 DIAGNOSIS — R609 Edema, unspecified: Secondary | ICD-10-CM | POA: Diagnosis not present

## 2013-09-23 DIAGNOSIS — K2100 Gastro-esophageal reflux disease with esophagitis, without bleeding: Secondary | ICD-10-CM | POA: Diagnosis not present

## 2013-09-23 DIAGNOSIS — R634 Abnormal weight loss: Secondary | ICD-10-CM | POA: Diagnosis not present

## 2013-09-23 DIAGNOSIS — G619 Inflammatory polyneuropathy, unspecified: Secondary | ICD-10-CM | POA: Diagnosis not present

## 2013-09-23 DIAGNOSIS — Z23 Encounter for immunization: Secondary | ICD-10-CM | POA: Diagnosis not present

## 2013-09-23 DIAGNOSIS — E039 Hypothyroidism, unspecified: Secondary | ICD-10-CM | POA: Diagnosis not present

## 2013-09-23 DIAGNOSIS — E669 Obesity, unspecified: Secondary | ICD-10-CM | POA: Diagnosis not present

## 2013-10-08 DIAGNOSIS — N318 Other neuromuscular dysfunction of bladder: Secondary | ICD-10-CM | POA: Diagnosis not present

## 2013-10-08 DIAGNOSIS — N952 Postmenopausal atrophic vaginitis: Secondary | ICD-10-CM | POA: Diagnosis not present

## 2013-10-08 DIAGNOSIS — R339 Retention of urine, unspecified: Secondary | ICD-10-CM | POA: Diagnosis not present

## 2013-10-08 DIAGNOSIS — N3946 Mixed incontinence: Secondary | ICD-10-CM | POA: Diagnosis not present

## 2013-10-08 DIAGNOSIS — R3915 Urgency of urination: Secondary | ICD-10-CM | POA: Diagnosis not present

## 2013-10-08 DIAGNOSIS — R350 Frequency of micturition: Secondary | ICD-10-CM | POA: Diagnosis not present

## 2013-10-09 DIAGNOSIS — R222 Localized swelling, mass and lump, trunk: Secondary | ICD-10-CM | POA: Diagnosis not present

## 2013-10-09 DIAGNOSIS — Z1231 Encounter for screening mammogram for malignant neoplasm of breast: Secondary | ICD-10-CM | POA: Diagnosis not present

## 2013-10-09 DIAGNOSIS — J984 Other disorders of lung: Secondary | ICD-10-CM | POA: Diagnosis not present

## 2013-10-09 DIAGNOSIS — R079 Chest pain, unspecified: Secondary | ICD-10-CM | POA: Diagnosis not present

## 2013-10-12 DIAGNOSIS — I10 Essential (primary) hypertension: Secondary | ICD-10-CM | POA: Diagnosis not present

## 2013-10-12 DIAGNOSIS — S52599A Other fractures of lower end of unspecified radius, initial encounter for closed fracture: Secondary | ICD-10-CM | POA: Diagnosis not present

## 2013-10-15 DIAGNOSIS — S62109A Fracture of unspecified carpal bone, unspecified wrist, initial encounter for closed fracture: Secondary | ICD-10-CM | POA: Diagnosis not present

## 2013-10-15 DIAGNOSIS — M545 Low back pain, unspecified: Secondary | ICD-10-CM | POA: Diagnosis not present

## 2013-10-15 DIAGNOSIS — S339XXA Sprain of unspecified parts of lumbar spine and pelvis, initial encounter: Secondary | ICD-10-CM | POA: Diagnosis not present

## 2013-10-21 DIAGNOSIS — S339XXA Sprain of unspecified parts of lumbar spine and pelvis, initial encounter: Secondary | ICD-10-CM | POA: Diagnosis not present

## 2013-10-21 DIAGNOSIS — M545 Low back pain, unspecified: Secondary | ICD-10-CM | POA: Diagnosis not present

## 2013-10-21 DIAGNOSIS — K5909 Other constipation: Secondary | ICD-10-CM | POA: Diagnosis not present

## 2013-10-21 DIAGNOSIS — S62109A Fracture of unspecified carpal bone, unspecified wrist, initial encounter for closed fracture: Secondary | ICD-10-CM | POA: Diagnosis not present

## 2013-10-24 DIAGNOSIS — S339XXA Sprain of unspecified parts of lumbar spine and pelvis, initial encounter: Secondary | ICD-10-CM | POA: Diagnosis not present

## 2013-10-24 DIAGNOSIS — Z885 Allergy status to narcotic agent status: Secondary | ICD-10-CM | POA: Diagnosis not present

## 2013-10-24 DIAGNOSIS — Z9181 History of falling: Secondary | ICD-10-CM | POA: Diagnosis not present

## 2013-10-24 DIAGNOSIS — Z883 Allergy status to other anti-infective agents status: Secondary | ICD-10-CM | POA: Diagnosis not present

## 2013-10-24 DIAGNOSIS — R630 Anorexia: Secondary | ICD-10-CM | POA: Diagnosis not present

## 2013-10-24 DIAGNOSIS — E039 Hypothyroidism, unspecified: Secondary | ICD-10-CM | POA: Diagnosis not present

## 2013-10-24 DIAGNOSIS — K449 Diaphragmatic hernia without obstruction or gangrene: Secondary | ICD-10-CM | POA: Diagnosis not present

## 2013-10-24 DIAGNOSIS — Z888 Allergy status to other drugs, medicaments and biological substances status: Secondary | ICD-10-CM | POA: Diagnosis not present

## 2013-10-24 DIAGNOSIS — S62109A Fracture of unspecified carpal bone, unspecified wrist, initial encounter for closed fracture: Secondary | ICD-10-CM | POA: Diagnosis not present

## 2013-10-24 DIAGNOSIS — M549 Dorsalgia, unspecified: Secondary | ICD-10-CM | POA: Diagnosis not present

## 2013-10-24 DIAGNOSIS — K2100 Gastro-esophageal reflux disease with esophagitis, without bleeding: Secondary | ICD-10-CM | POA: Diagnosis not present

## 2013-10-24 DIAGNOSIS — Z880 Allergy status to penicillin: Secondary | ICD-10-CM | POA: Diagnosis not present

## 2013-10-24 DIAGNOSIS — R110 Nausea: Secondary | ICD-10-CM | POA: Diagnosis not present

## 2013-10-24 DIAGNOSIS — Z87440 Personal history of urinary (tract) infections: Secondary | ICD-10-CM | POA: Diagnosis not present

## 2013-10-24 DIAGNOSIS — Z79899 Other long term (current) drug therapy: Secondary | ICD-10-CM | POA: Diagnosis not present

## 2013-10-24 DIAGNOSIS — Z91041 Radiographic dye allergy status: Secondary | ICD-10-CM | POA: Diagnosis not present

## 2013-10-24 DIAGNOSIS — W19XXXA Unspecified fall, initial encounter: Secondary | ICD-10-CM | POA: Diagnosis not present

## 2013-10-24 DIAGNOSIS — Z882 Allergy status to sulfonamides status: Secondary | ICD-10-CM | POA: Diagnosis not present

## 2013-10-24 DIAGNOSIS — G40802 Other epilepsy, not intractable, without status epilepticus: Secondary | ICD-10-CM | POA: Diagnosis not present

## 2013-10-24 DIAGNOSIS — IMO0002 Reserved for concepts with insufficient information to code with codable children: Secondary | ICD-10-CM | POA: Diagnosis not present

## 2013-10-24 DIAGNOSIS — M545 Low back pain, unspecified: Secondary | ICD-10-CM | POA: Diagnosis not present

## 2013-10-24 DIAGNOSIS — K5909 Other constipation: Secondary | ICD-10-CM | POA: Diagnosis not present

## 2013-10-24 DIAGNOSIS — M1990 Unspecified osteoarthritis, unspecified site: Secondary | ICD-10-CM | POA: Diagnosis not present

## 2013-10-24 DIAGNOSIS — G619 Inflammatory polyneuropathy, unspecified: Secondary | ICD-10-CM | POA: Diagnosis not present

## 2013-10-24 DIAGNOSIS — K5900 Constipation, unspecified: Secondary | ICD-10-CM | POA: Diagnosis not present

## 2013-10-25 DIAGNOSIS — K5900 Constipation, unspecified: Secondary | ICD-10-CM | POA: Diagnosis not present

## 2013-10-25 DIAGNOSIS — K449 Diaphragmatic hernia without obstruction or gangrene: Secondary | ICD-10-CM | POA: Diagnosis not present

## 2013-10-25 DIAGNOSIS — M549 Dorsalgia, unspecified: Secondary | ICD-10-CM | POA: Diagnosis not present

## 2013-10-25 DIAGNOSIS — R630 Anorexia: Secondary | ICD-10-CM | POA: Diagnosis not present

## 2013-10-25 DIAGNOSIS — E039 Hypothyroidism, unspecified: Secondary | ICD-10-CM | POA: Diagnosis not present

## 2013-10-25 DIAGNOSIS — K5909 Other constipation: Secondary | ICD-10-CM | POA: Diagnosis not present

## 2013-10-25 DIAGNOSIS — W19XXXA Unspecified fall, initial encounter: Secondary | ICD-10-CM | POA: Diagnosis not present

## 2013-10-25 DIAGNOSIS — M1990 Unspecified osteoarthritis, unspecified site: Secondary | ICD-10-CM | POA: Diagnosis not present

## 2013-10-25 DIAGNOSIS — IMO0002 Reserved for concepts with insufficient information to code with codable children: Secondary | ICD-10-CM | POA: Diagnosis not present

## 2013-11-04 DIAGNOSIS — L851 Acquired keratosis [keratoderma] palmaris et plantaris: Secondary | ICD-10-CM | POA: Diagnosis not present

## 2013-11-04 DIAGNOSIS — I70209 Unspecified atherosclerosis of native arteries of extremities, unspecified extremity: Secondary | ICD-10-CM | POA: Diagnosis not present

## 2013-11-04 DIAGNOSIS — B351 Tinea unguium: Secondary | ICD-10-CM | POA: Diagnosis not present

## 2013-11-19 DIAGNOSIS — R339 Retention of urine, unspecified: Secondary | ICD-10-CM | POA: Diagnosis not present

## 2013-11-19 DIAGNOSIS — R350 Frequency of micturition: Secondary | ICD-10-CM | POA: Diagnosis not present

## 2013-11-19 DIAGNOSIS — N318 Other neuromuscular dysfunction of bladder: Secondary | ICD-10-CM | POA: Diagnosis not present

## 2013-11-19 DIAGNOSIS — R351 Nocturia: Secondary | ICD-10-CM | POA: Diagnosis not present

## 2013-11-19 DIAGNOSIS — N3946 Mixed incontinence: Secondary | ICD-10-CM | POA: Diagnosis not present

## 2013-11-19 DIAGNOSIS — R3915 Urgency of urination: Secondary | ICD-10-CM | POA: Diagnosis not present

## 2013-11-19 DIAGNOSIS — N952 Postmenopausal atrophic vaginitis: Secondary | ICD-10-CM | POA: Diagnosis not present

## 2013-11-24 DIAGNOSIS — M545 Low back pain, unspecified: Secondary | ICD-10-CM | POA: Diagnosis not present

## 2013-11-24 DIAGNOSIS — K5909 Other constipation: Secondary | ICD-10-CM | POA: Diagnosis not present

## 2013-11-24 DIAGNOSIS — M1990 Unspecified osteoarthritis, unspecified site: Secondary | ICD-10-CM | POA: Diagnosis not present

## 2013-11-24 DIAGNOSIS — K2100 Gastro-esophageal reflux disease with esophagitis, without bleeding: Secondary | ICD-10-CM | POA: Diagnosis not present

## 2013-11-24 DIAGNOSIS — S32009A Unspecified fracture of unspecified lumbar vertebra, initial encounter for closed fracture: Secondary | ICD-10-CM | POA: Diagnosis not present

## 2013-11-24 DIAGNOSIS — G619 Inflammatory polyneuropathy, unspecified: Secondary | ICD-10-CM | POA: Diagnosis not present

## 2013-11-24 DIAGNOSIS — S339XXA Sprain of unspecified parts of lumbar spine and pelvis, initial encounter: Secondary | ICD-10-CM | POA: Diagnosis not present

## 2013-11-24 DIAGNOSIS — E039 Hypothyroidism, unspecified: Secondary | ICD-10-CM | POA: Diagnosis not present

## 2014-01-13 DIAGNOSIS — I70209 Unspecified atherosclerosis of native arteries of extremities, unspecified extremity: Secondary | ICD-10-CM | POA: Diagnosis not present

## 2014-01-13 DIAGNOSIS — B351 Tinea unguium: Secondary | ICD-10-CM | POA: Diagnosis not present

## 2014-01-13 DIAGNOSIS — L851 Acquired keratosis [keratoderma] palmaris et plantaris: Secondary | ICD-10-CM | POA: Diagnosis not present

## 2014-01-24 DIAGNOSIS — G43919 Migraine, unspecified, intractable, without status migrainosus: Secondary | ICD-10-CM | POA: Diagnosis not present

## 2014-01-24 DIAGNOSIS — R609 Edema, unspecified: Secondary | ICD-10-CM | POA: Diagnosis not present

## 2014-01-24 DIAGNOSIS — E039 Hypothyroidism, unspecified: Secondary | ICD-10-CM | POA: Diagnosis not present

## 2014-01-24 DIAGNOSIS — M1990 Unspecified osteoarthritis, unspecified site: Secondary | ICD-10-CM | POA: Diagnosis not present

## 2014-01-24 DIAGNOSIS — M545 Low back pain, unspecified: Secondary | ICD-10-CM | POA: Diagnosis not present

## 2014-01-24 DIAGNOSIS — R5383 Other fatigue: Secondary | ICD-10-CM | POA: Diagnosis not present

## 2014-01-24 DIAGNOSIS — E669 Obesity, unspecified: Secondary | ICD-10-CM | POA: Diagnosis not present

## 2014-01-24 DIAGNOSIS — K2100 Gastro-esophageal reflux disease with esophagitis, without bleeding: Secondary | ICD-10-CM | POA: Diagnosis not present

## 2014-01-24 DIAGNOSIS — R5381 Other malaise: Secondary | ICD-10-CM | POA: Diagnosis not present

## 2014-02-05 ENCOUNTER — Ambulatory Visit: Payer: Medicare Other | Admitting: Nurse Practitioner

## 2014-02-07 ENCOUNTER — Ambulatory Visit: Payer: Medicare Other | Admitting: Internal Medicine

## 2014-02-13 ENCOUNTER — Other Ambulatory Visit: Payer: Self-pay | Admitting: Cardiology

## 2014-02-25 ENCOUNTER — Encounter: Payer: Self-pay | Admitting: Cardiology

## 2014-02-25 ENCOUNTER — Ambulatory Visit (INDEPENDENT_AMBULATORY_CARE_PROVIDER_SITE_OTHER): Payer: Medicare Other | Admitting: Cardiology

## 2014-02-25 VITALS — BP 111/74 | HR 67 | Ht 69.0 in | Wt 195.0 lb

## 2014-02-25 DIAGNOSIS — I5032 Chronic diastolic (congestive) heart failure: Secondary | ICD-10-CM | POA: Diagnosis not present

## 2014-02-25 DIAGNOSIS — R0602 Shortness of breath: Secondary | ICD-10-CM | POA: Diagnosis not present

## 2014-02-25 DIAGNOSIS — I059 Rheumatic mitral valve disease, unspecified: Secondary | ICD-10-CM | POA: Diagnosis not present

## 2014-02-25 DIAGNOSIS — I2789 Other specified pulmonary heart diseases: Secondary | ICD-10-CM

## 2014-02-25 DIAGNOSIS — R55 Syncope and collapse: Secondary | ICD-10-CM | POA: Diagnosis not present

## 2014-02-25 DIAGNOSIS — I340 Nonrheumatic mitral (valve) insufficiency: Secondary | ICD-10-CM

## 2014-02-25 NOTE — Assessment & Plan Note (Signed)
Mild by last assessment.

## 2014-02-25 NOTE — Progress Notes (Signed)
Clinical Summary [name] is an 80 y.o.female last seen in September 2014. She has had no syncope since that visit. Did have a mechanical fall, missed stepping off of a raised platform.  She tells me that she has been tired, short of breath. Atypical chest pains that are nonexertional. She saw [name] in February.  Echocardiogram from July 2013 revealed mild LVH with LVEF 54-65%, grade 1 diastolic dysfunction, mild mitral regurgitation, moderate tricuspid regurgitation, PA systolic pressure 47 mm mercury.  ECG today shows normal sinus rhythm.   Allergies  Allergen Reactions  . Cephalexin Anaphylaxis and Swelling  . Doxazosin Mesylate Other (See Comments)    Made patient EXTREMELY sick.   . Codeine Other (See Comments)    Hallucinations  . Indomethacin Other (See Comments)    Severe Headache   . Ivp Dye [Iodinated Diagnostic Agents] Hives  . Levofloxacin Other (See Comments)    Unknown  . Morphine Other (See Comments)    Hallucinations   . Sulfamethoxazole-Trimethoprim Other (See Comments)    Unknown   . Penicillins Other (See Comments)    Made patient go into shock    Current Outpatient Prescriptions  Medication Sig Dispense Refill  . aspirin EC 81 MG tablet Take 1 tablet (81 mg total) by mouth daily.      . bisacodyl (BISACODYL) 5 MG EC tablet Take 5 mg by mouth daily as needed for constipation.      . Cholecalciferol (VITAMIN D-3) 1000 UNITS CAPS Take 1 capsule by mouth daily.      . Cranberry-Vitamin C-Vitamin E (CRANBERRY PLUS VITAMIN C) 4200-20-3 MG-MG-UNIT CAPS Take 1 capsule by mouth daily.      Marland Kitchen DEXILANT 60 MG capsule TAKE 1 CAPSULE DAILY  90 capsule  2  . estradiol (ESTRACE) 0.1 MG/GM vaginal cream Place 4 g vaginally daily. Use twice a week      . fexofenadine (ALLEGRA) 180 MG tablet Take 180 mg by mouth daily.      . fludrocortisone (FLORINEF) 0.1 MG tablet TAKE 1 TABLET THREE TIMES A WEEK  36 tablet  3  . fluticasone (FLONASE) 50 MCG/ACT nasal spray  Place 2 sprays into the nose as needed for allergies.       . furosemide (LASIX) 20 MG tablet Take 1 tablet (20 mg total) by mouth daily.  90 tablet  3  . gabapentin (NEURONTIN) 100 MG capsule Take 200 mg by mouth at bedtime. As tolerated      . Glucosamine-Chondroitin-MSM 750-400-375 MG TABS Take 1 tablet by mouth daily.      . Hypromellose 0.3 % SOLN Apply to eye. Applies to both eyes daily      . levothyroxine (SYNTHROID, LEVOTHROID) 75 MCG tablet Take 75 mcg by mouth daily before breakfast.      . montelukast (SINGULAIR) 10 MG tablet Take 10 mg by mouth daily.        . Multiple Vitamins-Minerals (CVS SPECTRAVITE SENIOR) TABS Take 1 tablet by mouth daily.        . nitrofurantoin (MACRODANTIN) 50 MG capsule Take 50 mg by mouth at bedtime.      . nitroGLYCERIN (NITROSTAT) 0.4 MG SL tablet Place 0.4 mg under the tongue every 5 (five) minutes as needed for chest pain.      . Probiotic Product (ALIGN PO) Take 1 tablet by mouth daily.       Marland Kitchen topiramate (TOPAMAX) 50 MG tablet Takes 2 tablets in the morning and 1 tablet in the evening.  270 tablet  3  . vitamin C (ASCORBIC ACID) 500 MG tablet Take 500 mg by mouth daily.       . vitamin E 400 UNIT capsule Take 400 Units by mouth daily.       No current facility-administered medications for this visit.    Past Medical History  Diagnosis Date  . Syncope     Neurocardiogenic - positive tilt table test, on Florinef  . Allergy to radiographic dye   . Diverticulosis of colon (without mention of hemorrhage)     TCS by Dr. Gala Romney  . Irritable bowel syndrome   . GERD (gastroesophageal reflux disease)   . Peripheral polyneuropathy     Confirmed by EMG and nerve conduction velocities   . Migraine   . Pulmonary nodule   . Mitral regurgitation   . Hemorrhoids, internal     TCS by Dr. Gala Romney  . Chronic diastolic heart failure   . Tubular adenoma     Social History [name] reports that she has never smoked. She has never used smokeless  tobacco. [name] reports that she does not drink alcohol.  Review of Systems No palpitations. No obvious bleeding problems. Appetite is stable. Weight has increased since last visit. Otherwise negative.  Physical Examination Filed Vitals:   02/25/14 1259  BP: 111/74  Pulse: 67   Filed Weights   02/25/14 1259  Weight: 195 lb (88.451 kg)    Patient appears comfortable at rest. HEENT: Conjunctiva and lids normal, oropharynx clear.  Neck: Supple, no elevated JVP or carotid bruits, no thyromegaly.  Lungs: Clear to auscultation, nonlabored breathing at rest.  Cardiac: Regular rate and rhythm, no S3.  Abdomen: Soft, nontender,bowel sounds present.  Extremities: No pitting edema, distal pulses 2+. Skin: Warm and dry. Musculoskeletal: No kyphosis. Neuropsychiatric: Alert and oriented x3, affect appropriate.   Problem List and Plan   Vasovagal syncope No recurrences. Patient takes Florinef 3 times a week.  Chronic diastolic heart failure Her weight is up compared to last visit, but she has had no progressive leg edema. Uses low-dose Lasix. Last echocardiogram was in 2013. With increased shortness of breath, will repeat echocardiogram.  Mitral regurgitation Mild by last assessment.  HYPERTENSION, PULMONARY Moderate tricuspid regurgitation with PASP 47 mmHg by last assessment.    Satira Sark, M.D., F.A.C.C.

## 2014-02-25 NOTE — Assessment & Plan Note (Signed)
No recurrences. Patient takes Florinef 3 times a week.

## 2014-02-25 NOTE — Assessment & Plan Note (Signed)
Her weight is up compared to last visit, but she has had no progressive leg edema. Uses low-dose Lasix. Last echocardiogram was in 2013. With increased shortness of breath, will repeat echocardiogram.

## 2014-02-25 NOTE — Assessment & Plan Note (Signed)
Moderate tricuspid regurgitation with PASP 47 mmHg by last assessment.

## 2014-02-25 NOTE — Patient Instructions (Signed)
Your physician recommends that you schedule a follow-up appointment in: 6 months. You will receive a reminder letter in the mail in about 4 months reminding you to call and schedule your appointment. If you don't receive this letter, please contact our office. Your physician recommends that you continue on your current medications as directed. Please refer to the Current Medication list given to you today. Your physician has requested that you have an echocardiogram. Echocardiography is a painless test that uses sound waves to create images of your heart. It provides your doctor with information about the size and shape of your heart and how well your heart's chambers and valves are working. This procedure takes approximately one hour. There are no restrictions for this procedure.  

## 2014-03-05 DIAGNOSIS — L570 Actinic keratosis: Secondary | ICD-10-CM | POA: Diagnosis not present

## 2014-03-05 DIAGNOSIS — Z85828 Personal history of other malignant neoplasm of skin: Secondary | ICD-10-CM | POA: Diagnosis not present

## 2014-03-12 ENCOUNTER — Other Ambulatory Visit: Payer: Self-pay

## 2014-03-12 ENCOUNTER — Other Ambulatory Visit (INDEPENDENT_AMBULATORY_CARE_PROVIDER_SITE_OTHER): Payer: Medicare Other

## 2014-03-12 DIAGNOSIS — R0602 Shortness of breath: Secondary | ICD-10-CM | POA: Diagnosis not present

## 2014-03-12 DIAGNOSIS — I5032 Chronic diastolic (congestive) heart failure: Secondary | ICD-10-CM

## 2014-03-17 ENCOUNTER — Telehealth: Payer: Self-pay | Admitting: *Deleted

## 2014-03-17 NOTE — Telephone Encounter (Signed)
Message copied by Laurine Blazer on Mon Mar 17, 2014  1:56 PM ------      Message from: MCDOWELL, Aloha Gell      Created: Wed Mar 12, 2014 12:57 PM       Reviewed. Overall stable findings in terms of LVEF and also pulmonary artery systolic pressure. Continue current regimen. ------

## 2014-03-17 NOTE — Telephone Encounter (Signed)
Notes Recorded by Laurine Blazer, LPN on 2/90/2111 at 5:52 PM Patient notified and verbalized understanding.

## 2014-03-19 DIAGNOSIS — L259 Unspecified contact dermatitis, unspecified cause: Secondary | ICD-10-CM | POA: Diagnosis not present

## 2014-03-19 DIAGNOSIS — L570 Actinic keratosis: Secondary | ICD-10-CM | POA: Diagnosis not present

## 2014-03-24 DIAGNOSIS — J209 Acute bronchitis, unspecified: Secondary | ICD-10-CM | POA: Diagnosis not present

## 2014-03-24 DIAGNOSIS — M1990 Unspecified osteoarthritis, unspecified site: Secondary | ICD-10-CM | POA: Diagnosis not present

## 2014-03-26 DIAGNOSIS — L851 Acquired keratosis [keratoderma] palmaris et plantaris: Secondary | ICD-10-CM | POA: Diagnosis not present

## 2014-03-26 DIAGNOSIS — B351 Tinea unguium: Secondary | ICD-10-CM | POA: Diagnosis not present

## 2014-03-26 DIAGNOSIS — I70209 Unspecified atherosclerosis of native arteries of extremities, unspecified extremity: Secondary | ICD-10-CM | POA: Diagnosis not present

## 2014-04-02 DIAGNOSIS — K1120 Sialoadenitis, unspecified: Secondary | ICD-10-CM | POA: Diagnosis not present

## 2014-04-15 DIAGNOSIS — J019 Acute sinusitis, unspecified: Secondary | ICD-10-CM | POA: Diagnosis not present

## 2014-04-15 DIAGNOSIS — R51 Headache: Secondary | ICD-10-CM | POA: Diagnosis not present

## 2014-05-14 DIAGNOSIS — H6120 Impacted cerumen, unspecified ear: Secondary | ICD-10-CM | POA: Diagnosis not present

## 2014-05-14 DIAGNOSIS — H903 Sensorineural hearing loss, bilateral: Secondary | ICD-10-CM | POA: Diagnosis not present

## 2014-05-14 DIAGNOSIS — I10 Essential (primary) hypertension: Secondary | ICD-10-CM | POA: Diagnosis not present

## 2014-05-20 DIAGNOSIS — N3941 Urge incontinence: Secondary | ICD-10-CM | POA: Diagnosis not present

## 2014-05-20 DIAGNOSIS — Z79899 Other long term (current) drug therapy: Secondary | ICD-10-CM | POA: Diagnosis not present

## 2014-05-20 DIAGNOSIS — N952 Postmenopausal atrophic vaginitis: Secondary | ICD-10-CM | POA: Diagnosis not present

## 2014-05-20 DIAGNOSIS — R350 Frequency of micturition: Secondary | ICD-10-CM | POA: Diagnosis not present

## 2014-05-20 DIAGNOSIS — R3915 Urgency of urination: Secondary | ICD-10-CM | POA: Diagnosis not present

## 2014-05-20 DIAGNOSIS — N318 Other neuromuscular dysfunction of bladder: Secondary | ICD-10-CM | POA: Diagnosis not present

## 2014-05-21 ENCOUNTER — Ambulatory Visit: Payer: Medicare Other | Admitting: Neurology

## 2014-05-28 ENCOUNTER — Encounter: Payer: Self-pay | Admitting: Neurology

## 2014-05-29 ENCOUNTER — Telehealth: Payer: Self-pay | Admitting: *Deleted

## 2014-06-04 NOTE — Telephone Encounter (Signed)
I called and LMVM for pt to return call for sooner appt, due to Dr. Leonie Man cancelling her last appt.   He stated see 2nd week of July, (NP appt ok).

## 2014-06-11 DIAGNOSIS — B351 Tinea unguium: Secondary | ICD-10-CM | POA: Diagnosis not present

## 2014-06-11 DIAGNOSIS — I70209 Unspecified atherosclerosis of native arteries of extremities, unspecified extremity: Secondary | ICD-10-CM | POA: Diagnosis not present

## 2014-06-11 DIAGNOSIS — L851 Acquired keratosis [keratoderma] palmaris et plantaris: Secondary | ICD-10-CM | POA: Diagnosis not present

## 2014-06-21 ENCOUNTER — Other Ambulatory Visit: Payer: Self-pay | Admitting: Gastroenterology

## 2014-06-25 DIAGNOSIS — N3090 Cystitis, unspecified without hematuria: Secondary | ICD-10-CM | POA: Diagnosis not present

## 2014-06-25 DIAGNOSIS — N8110 Cystocele, unspecified: Secondary | ICD-10-CM | POA: Diagnosis not present

## 2014-07-03 ENCOUNTER — Ambulatory Visit: Payer: Medicare Other | Admitting: Nurse Practitioner

## 2014-07-08 ENCOUNTER — Ambulatory Visit: Payer: Medicare Other | Admitting: Nurse Practitioner

## 2014-07-10 ENCOUNTER — Telehealth: Payer: Self-pay | Admitting: *Deleted

## 2014-07-10 NOTE — Telephone Encounter (Signed)
Agreed to come in at [facility]

## 2014-07-11 ENCOUNTER — Ambulatory Visit (INDEPENDENT_AMBULATORY_CARE_PROVIDER_SITE_OTHER): Payer: Medicare Other | Admitting: Neurology

## 2014-07-11 ENCOUNTER — Encounter: Payer: Self-pay | Admitting: Neurology

## 2014-07-11 ENCOUNTER — Telehealth: Payer: Self-pay | Admitting: Neurology

## 2014-07-11 VITALS — BP 125/64 | HR 59 | Wt 196.2 lb

## 2014-07-11 DIAGNOSIS — W19XXXA Unspecified fall, initial encounter: Secondary | ICD-10-CM | POA: Insufficient documentation

## 2014-07-11 DIAGNOSIS — R269 Unspecified abnormalities of gait and mobility: Secondary | ICD-10-CM | POA: Diagnosis not present

## 2014-07-11 DIAGNOSIS — W19XXXS Unspecified fall, sequela: Secondary | ICD-10-CM

## 2014-07-11 DIAGNOSIS — R296 Repeated falls: Secondary | ICD-10-CM | POA: Insufficient documentation

## 2014-07-11 DIAGNOSIS — G609 Hereditary and idiopathic neuropathy, unspecified: Secondary | ICD-10-CM

## 2014-07-11 DIAGNOSIS — G629 Polyneuropathy, unspecified: Secondary | ICD-10-CM | POA: Insufficient documentation

## 2014-07-11 MED ORDER — GABAPENTIN 100 MG PO CAPS: 200.0000 mg | ORAL_CAPSULE | Freq: Every day | ORAL | Status: DC

## 2014-07-11 NOTE — Telephone Encounter (Signed)
Rx has been sent  

## 2014-07-11 NOTE — Patient Instructions (Signed)
I had a long discussion the patient with regards to her neuropathy, balance difficulties, fall prevention and answered questions. I recommend she get up slowly and to orthostatic problems exercises and use a cane for walking long distances. Continue Topamax and Gabapentin in the current dosages for her paresthesias. Return for followup in 6 months with Charlott Holler, NP or earlier if necessary.  Fall Prevention and Home Safety Falls cause injuries and can affect all age groups. It is possible to use preventive measures to significantly decrease the likelihood of falls. There are many simple measures which can make your home safer and prevent falls. OUTDOORS  Repair cracks and edges of walkways and driveways.  Remove high doorway thresholds.  Trim shrubbery on the main path into your home.  Have good outside lighting.  Clear walkways of tools, rocks, debris, and clutter.  Check that handrails are not broken and are securely fastened. Both sides of steps should have handrails.  Have leaves, snow, and ice cleared regularly.  Use sand or salt on walkways during winter months.  In the garage, clean up grease or oil spills. BATHROOM  Install night lights.  Install grab bars by the toilet and in the tub and shower.  Use non-skid mats or decals in the tub or shower.  Place a plastic non-slip stool in the shower to sit on, if needed.  Keep floors dry and clean up all water on the floor immediately.  Remove soap buildup in the tub or shower on a regular basis.  Secure bath mats with non-slip, double-sided rug tape.  Remove throw rugs and tripping hazards from the floors. BEDROOMS  Install night lights.  Make sure a bedside light is easy to reach.  Do not use oversized bedding.  Keep a telephone by your bedside.  Have a firm chair with side arms to use for getting dressed.  Remove throw rugs and tripping hazards from the floor. KITCHEN  Keep handles on pots and pans turned  toward the center of the stove. Use back burners when possible.  Clean up spills quickly and allow time for drying.  Avoid walking on wet floors.  Avoid hot utensils and knives.  Position shelves so they are not too high or low.  Place commonly used objects within easy reach.  If necessary, use a sturdy step stool with a grab bar when reaching.  Keep electrical cables out of the way.  Do not use floor polish or wax that makes floors slippery. If you must use wax, use non-skid floor wax.  Remove throw rugs and tripping hazards from the floor. STAIRWAYS  Never leave objects on stairs.  Place handrails on both sides of stairways and use them. Fix any loose handrails. Make sure handrails on both sides of the stairways are as long as the stairs.  Check carpeting to make sure it is firmly attached along stairs. Make repairs to worn or loose carpet promptly.  Avoid placing throw rugs at the top or bottom of stairways, or properly secure the rug with carpet tape to prevent slippage. Get rid of throw rugs, if possible.  Have an electrician put in a light switch at the top and bottom of the stairs. OTHER FALL PREVENTION TIPS  Wear low-heel or rubber-soled shoes that are supportive and fit well. Wear closed toe shoes.  When using a stepladder, make sure it is fully opened and both spreaders are firmly locked. Do not climb a closed stepladder.  Add color or contrast paint or tape  to grab bars and handrails in your home. Place contrasting color strips on first and last steps.  Learn and use mobility aids as needed. Install an electrical emergency response system.  Turn on lights to avoid dark areas. Replace light bulbs that burn out immediately. Get light switches that glow.  Arrange furniture to create clear pathways. Keep furniture in the same place.  Firmly attach carpet with non-skid or double-sided tape.  Eliminate uneven floor surfaces.  Select a carpet pattern that does not  visually hide the edge of steps.  Be aware of all pets. OTHER HOME SAFETY TIPS  Set the water temperature for 120 F (48.8 C).  Keep emergency numbers on or near the telephone.  Keep smoke detectors on every level of the home and near sleeping areas. Document Released: 11/25/2002 Document Revised: 06/05/2012 Document Reviewed: 02/24/2012 [facility] Patient Information 2015 [place], [place]. This information is not intended to replace advice given to you by your health care provider. Make sure you discuss any questions you have with your health care provider.

## 2014-07-11 NOTE — Progress Notes (Signed)
Marland Kitchen   [facility]  PATIENT: [name] DOB: November 10, 1930   HISTORY FROM: patient REASON FOR VISIT: routine follow up   HISTORICAL  CHIEF COMPLAINT:  No chief complaint on file.   HISTORY OF PRESENT ILLNESS: PRIOR HPI (CM): 80 year old Caucasian female returns today for followup since her last visit on 05/23/12. for  sensory polyneuropathy and syncopal or seizure-like activity. She has had no seizure activity.  Her paresthesias and burning pain in her feet are stable.  She reports having a dark red color to the bottom of her feet. Denies being restless at night but has increasing pain in her feet. She is taking Topamax which she is tolerating well and gabapentin she only takes 200mg  at night due to increase daytime sleepiness.She has not fallen and uses no assistive device. She remains independent in all ADL's. She has had 2 to 3 UTI's in the last few months and on several antibiotics. See ROS.  UPDATE 08/05/13 (LL):  Returns for follow up since last visit on 01/07/13 for sensory polyneuropathy and seizure. She has had no seizure activity.  Her paresthesias and burning pain in her feet are stable.  She reports having a dark red color to the bottom of her feet, redness at the end of the day comes up mid-calf.  Has increasing pain in her feet, feels like her balance is worse some days.  She is taking Topamax which she is tolerating well and gabapentin she only takes 200mg  at night due to increase daytime sleepiness. She has not fallen but stumbles now and uses cane as assistive device sometimes.  Reports doing exercises in bed because she is not stable enough to do them standing.  Had drug reaction to Trimethoprim (hurt all over) and since has had pain in right shoulder;  Spoke to PCP about it.  Update 07/10/2014 : She returns for followup her last visit almost a year ago. She has had quite an eventful year with multiple health issues. She fell last October and fractured her  back and rest. In April she felt sick with bronchitis. She subsequently had what sounds with a salivary gland infection. She seems to be doing reasonably all right now. She continues to have tingling and numbness in her feet which are bothersome. She takes Topamax 2 tablets in the day and one in the evening along with gabapentin 2 tablets at night. She has no trouble sleeping. She does stagger at times and occasionally falls but can catch herself. She rarely uses a cane. She has no difficulty with balance particularly at night.  REVIEW OF SYSTEMS: Full 14 system review of systems performed and notable only for: Fatigue, eye itching, occasional double vision, leg swelling, frequency of urination, bladder urgency, joint pain, back pain, aching muscles, walking difficulty, headache and weakness     ALLERGIES: Allergies  Allergen Reactions  . Cephalexin Anaphylaxis and Swelling  . Doxazosin Mesylate Other (See Comments)    Made patient EXTREMELY sick.   . Codeine Other (See Comments)    Hallucinations  . Indomethacin Other (See Comments)    Severe Headache   . Ivp Dye [Iodinated Diagnostic Agents] Hives  . Levofloxacin Other (See Comments)    Unknown  . Morphine Other (See Comments)    Hallucinations   . Sulfamethoxazole-Trimethoprim Other (See Comments)    Unknown   . Penicillins Other (See Comments)    Made patient go into shock    HOME MEDICATIONS: Outpatient Prescriptions Prior to Visit  Medication Sig  Dispense Refill  . aspirin EC 81 MG tablet Take 1 tablet (81 mg total) by mouth daily.      . bisacodyl (BISACODYL) 5 MG EC tablet Take 5 mg by mouth daily as needed for constipation.      . Cholecalciferol (VITAMIN D-3) 1000 UNITS CAPS Take 1 capsule by mouth daily.      . Cranberry-Vitamin C-Vitamin E (CRANBERRY PLUS VITAMIN C) 4200-20-3 MG-MG-UNIT CAPS Take 1 capsule by mouth daily.      Marland Kitchen DEXILANT 60 MG capsule TAKE 1 CAPSULE DAILY  90 capsule  3  . estradiol (ESTRACE) 0.1 MG/GM  vaginal cream Place 4 g vaginally daily. Use twice a week      . fexofenadine (ALLEGRA) 180 MG tablet Take 180 mg by mouth daily.      . fludrocortisone (FLORINEF) 0.1 MG tablet TAKE 1 TABLET THREE TIMES A WEEK  36 tablet  3  . fluticasone (FLONASE) 50 MCG/ACT nasal spray Place 2 sprays into the nose as needed for allergies.       . furosemide (LASIX) 20 MG tablet Take 1 tablet (20 mg total) by mouth daily.  90 tablet  3  . gabapentin (NEURONTIN) 100 MG capsule Take 200 mg by mouth at bedtime. As tolerated      . Glucosamine-Chondroitin-MSM 750-400-375 MG TABS Take 1 tablet by mouth daily.      . Hypromellose 0.3 % SOLN Apply to eye. Applies to both eyes daily      . levothyroxine (SYNTHROID, LEVOTHROID) 75 MCG tablet Take 75 mcg by mouth daily before breakfast.      . montelukast (SINGULAIR) 10 MG tablet Take 10 mg by mouth daily.        . Multiple Vitamins-Minerals ([employer]S SPECTRAVITE SENIOR) TABS Take 1 tablet by mouth daily.        . nitrofurantoin (MACRODANTIN) 50 MG capsule Take 50 mg by mouth at bedtime.      . nitroGLYCERIN (NITROSTAT) 0.4 MG SL tablet Place 0.4 mg under the tongue every 5 (five) minutes as needed for chest pain.      . Probiotic Product (ALIGN PO) Take 1 tablet by mouth daily.       Marland Kitchen topiramate (TOPAMAX) 50 MG tablet Takes 2 tablets in the morning and 1 tablet in the evening.  270 tablet  3  . vitamin C (ASCORBIC ACID) 500 MG tablet Take 500 mg by mouth daily.       . vitamin E 400 UNIT capsule Take 400 Units by mouth daily.       No facility-administered medications prior to visit.    PAST MEDICAL HISTORY: Past Medical History  Diagnosis Date  . Syncope     Neurocardiogenic - positive tilt table test, on Florinef  . Allergy to radiographic dye   . Diverticulosis of colon (without mention of hemorrhage)     TCS by Dr. Gala Romney  . Irritable bowel syndrome   . GERD (gastroesophageal reflux disease)   . Peripheral polyneuropathy     Confirmed by EMG and nerve  conduction velocities   . Migraine   . Pulmonary nodule   . Mitral regurgitation   . Hemorrhoids, internal     TCS by Dr. Gala Romney  . Chronic diastolic heart failure   . Tubular adenoma     PAST SURGICAL HISTORY: Past Surgical History  Procedure Laterality Date  . Wrist surgery    . Foot surgery    . Total abdominal hysterectomy    . Appendectomy    .  Back surgery    . Back pain      going to chiropracator for back and hip pain  . Colonoscopy  06/16/2005    internal hemorrhoids, L side diverticula - Dr. Gala Romney  . Esophagogastroduodenoscopy  12/21/2006      Dr. Vivi Ferns esophagus/Patulous EG junction, small to moderate sized hiatal hernia,   multiple fundal gland type appearing polyps biopsied, otherwise normal  . Colonoscopy N/A 05/02/2013    Dr. Gala Romney- normal rectum, scattered L sided diverticula. bx= tubular adenoma    FAMILY HISTORY: Family History  Problem Relation Age of Onset  . Colon cancer Neg Hx   . Stroke Father     SOCIAL HISTORY: History   Social History  . Marital Status: Widowed    Spouse Name: N/A    Number of Children: N/A  . Years of Education: N/A   Occupational History  . Not on file.   Social History Main Topics  . Smoking status: Never Smoker   . Smokeless tobacco: Never Used     Comment: tobacco use - no  . Alcohol Use: No  . Drug Use: No  . Sexual Activity: Not on file   Other Topics Concern  . Not on file   Social History Narrative   Retired, widowed, gets regular exercise.      PHYSICAL EXAM  Filed Vitals:   07/11/14 0919  BP: 125/64  Pulse: 59  Weight: 88.996 kg (196 lb 3.2 oz)   Body mass index is 28.96 kg/(m^2).  Physical Exam  General: Frail elderly Caucasian lady, seated, in no evident distress Head: head  normocephalic and atraumatic.  Oropharynx benign. Neck: supple with no carotid or supraclavicular bruits. Respiratory: LCA Cardiovascular: RRR no murmurs Musculoskeletal: No deformity Skin: Mild pedal edema,   pedal and posterior tibial pulses palpable 1+  Neurologic Exam  Mental Status: Awake and fully alert.  Oriented to place and time.  Follows 1,2, and 3 step commands.  Mood and affect appropriate. Cranial Nerves: Pupils equal, briskly reactive to light.  Extraocular movements full without nystagmus.  Visual fields full to confrontation.  Bil hearing loss.  Facial sensation intact.  Face, tongue, palate move normally and symmetrically.  Neck flexion and extension normal. Motor: Normal bulk and tone.  Normal strength in all tested extremity muscles.  No tremor, no drift. mild weakness ankle dorsiflexors Sensory: Decreased pinprick in stocking fashion to mid calf, decreased vibratory sensation upto ankles on both extremities, position sense normal. Coordination: Rapid alternating movements normal in all extremities.  Finger-to-nose and heel-to-shin performed accurately bilaterally. Gait and Station: Arises from chair without difficulty.  Stance is wide based and upright.  Unable to heel, toe with plantar weakness, and unable to perform Tandem.  Romberg negative. Reflexes: 1+ and symmetric upper, depressed at knees, and ankles. Toes downgoing.  DIAGNOSTIC DATA (LABS, IMAGING, TESTING) - I reviewed patient records, labs, notes, testing and imaging myself where available.  ASSESSMENT AND PLAN 80 year old right-handed Caucasian female with Chronic gait abnormality secondary to sensory polyneuropathy. Syncopal versus seizure episode. No episodes since 11/2008.  Plan: I had a long discussion the patient with regards to her neuropathy, balance difficulties, fall prevention and answered questions. I recommend she get up slowly and to orthostatic problems exercises and use a cane for walking long distances. Continue Topamax and Gabapentin in the current dosages for her paresthesias. Return for followup in 6 months with Charlott Holler, NP or earlier if necessary.   Antony Contras, MD  07/11/2014, 10:00  AM  [facility] [address], [place], [place] [address] [phone]

## 2014-07-11 NOTE — Telephone Encounter (Signed)
Patient called stating she would like for her prescription for Gabapentin 100 mg be sent to [facility].

## 2014-07-16 ENCOUNTER — Ambulatory Visit: Payer: Medicare Other | Admitting: Neurology

## 2014-07-25 DIAGNOSIS — E039 Hypothyroidism, unspecified: Secondary | ICD-10-CM | POA: Diagnosis not present

## 2014-07-25 DIAGNOSIS — E669 Obesity, unspecified: Secondary | ICD-10-CM | POA: Diagnosis not present

## 2014-07-25 DIAGNOSIS — N3000 Acute cystitis without hematuria: Secondary | ICD-10-CM | POA: Diagnosis not present

## 2014-07-25 DIAGNOSIS — G40909 Epilepsy, unspecified, not intractable, without status epilepticus: Secondary | ICD-10-CM | POA: Diagnosis not present

## 2014-07-25 DIAGNOSIS — M1990 Unspecified osteoarthritis, unspecified site: Secondary | ICD-10-CM | POA: Diagnosis not present

## 2014-07-25 DIAGNOSIS — K2100 Gastro-esophageal reflux disease with esophagitis, without bleeding: Secondary | ICD-10-CM | POA: Diagnosis not present

## 2014-08-04 DIAGNOSIS — G43919 Migraine, unspecified, intractable, without status migrainosus: Secondary | ICD-10-CM | POA: Diagnosis not present

## 2014-08-04 DIAGNOSIS — K2100 Gastro-esophageal reflux disease with esophagitis, without bleeding: Secondary | ICD-10-CM | POA: Diagnosis not present

## 2014-08-04 DIAGNOSIS — M545 Low back pain, unspecified: Secondary | ICD-10-CM | POA: Diagnosis not present

## 2014-08-04 DIAGNOSIS — E039 Hypothyroidism, unspecified: Secondary | ICD-10-CM | POA: Diagnosis not present

## 2014-08-04 DIAGNOSIS — R5381 Other malaise: Secondary | ICD-10-CM | POA: Diagnosis not present

## 2014-08-04 DIAGNOSIS — E669 Obesity, unspecified: Secondary | ICD-10-CM | POA: Diagnosis not present

## 2014-08-04 DIAGNOSIS — R609 Edema, unspecified: Secondary | ICD-10-CM | POA: Diagnosis not present

## 2014-08-04 DIAGNOSIS — M1990 Unspecified osteoarthritis, unspecified site: Secondary | ICD-10-CM | POA: Diagnosis not present

## 2014-08-20 DIAGNOSIS — L851 Acquired keratosis [keratoderma] palmaris et plantaris: Secondary | ICD-10-CM | POA: Diagnosis not present

## 2014-08-20 DIAGNOSIS — B351 Tinea unguium: Secondary | ICD-10-CM | POA: Diagnosis not present

## 2014-08-20 DIAGNOSIS — I70209 Unspecified atherosclerosis of native arteries of extremities, unspecified extremity: Secondary | ICD-10-CM | POA: Diagnosis not present

## 2014-08-29 ENCOUNTER — Ambulatory Visit (INDEPENDENT_AMBULATORY_CARE_PROVIDER_SITE_OTHER): Payer: Medicare Other | Admitting: Internal Medicine

## 2014-08-29 ENCOUNTER — Encounter: Payer: Self-pay | Admitting: Internal Medicine

## 2014-08-29 VITALS — BP 126/78 | HR 67 | Temp 97.4°F | Ht 65.0 in | Wt 189.0 lb

## 2014-08-29 DIAGNOSIS — K219 Gastro-esophageal reflux disease without esophagitis: Secondary | ICD-10-CM

## 2014-08-29 NOTE — Patient Instructions (Addendum)
Continue Dexilant daily  GERD information provided  Try taking Pepsid complete for any breakthrough symptoms  See Dr. Quintin Alto as needed for cough/hoarseness  Office follow-up in 6 months

## 2014-08-29 NOTE — Progress Notes (Signed)
Primary Care Physician:  Manon Hilding, MD Primary Gastroenterologist:  Dr. Dudley Major  Pre-Procedure History & Physical: HPI:  [name] is a 80 y.o. female here for a GERD. Has done very well since she was last here aside from a couple days when she developed nocturnal flare of her reflux symptoms. Possible dietary indiscretion. No dysphagia. No hematochezia, melena or hematemesis. She has lost about 6 pounds since she was here previously; she remains at 189 pounds today.  Gallbladder out about 5 years ago by Dr. Lindalou Hose in [place].   Past Medical History  Diagnosis Date  . Syncope     Neurocardiogenic - positive tilt table test, on Florinef  . Allergy to radiographic dye   . Diverticulosis of colon (without mention of hemorrhage)     TCS by Dr. Gala Romney  . Irritable bowel syndrome   . GERD (gastroesophageal reflux disease)   . Peripheral polyneuropathy     Confirmed by EMG and nerve conduction velocities   . Migraine   . Pulmonary nodule   . Mitral regurgitation   . Hemorrhoids, internal     TCS by Dr. Gala Romney  . Chronic diastolic heart failure   . Tubular adenoma     Past Surgical History  Procedure Laterality Date  . Wrist surgery    . Foot surgery    . Total abdominal hysterectomy    . Appendectomy    . Back surgery    . Back pain      going to chiropracator for back and hip pain  . Colonoscopy  06/16/2005    internal hemorrhoids, L side diverticula - Dr. Gala Romney  . Esophagogastroduodenoscopy  12/21/2006      Dr. Vivi Ferns esophagus/Patulous EG junction, small to moderate sized hiatal hernia,   multiple fundal gland type appearing polyps biopsied, otherwise normal  . Colonoscopy N/A 05/02/2013    Dr. Gala Romney- normal rectum, scattered L sided diverticula. bx= tubular adenoma    Prior to Admission medications   Medication Sig Start Date End Date Taking? Authorizing Provider  aspirin EC 81 MG tablet Take 1 tablet (81 mg total) by mouth daily. 09/12/12  Yes Burna Forts  Serpe, PA-C  bisacodyl (BISACODYL) 5 MG EC tablet Take 5 mg by mouth daily as needed for constipation.   Yes Historical Provider, MD  Cholecalciferol (VITAMIN D-3) 1000 UNITS CAPS Take 1 capsule by mouth daily.   Yes Historical Provider, MD  DEXILANT 60 MG capsule TAKE 1 CAPSULE DAILY   Yes Mahala Menghini, PA-C  fexofenadine (ALLEGRA) 180 MG tablet Take 180 mg by mouth daily.   Yes Historical Provider, MD  fludrocortisone (FLORINEF) 0.1 MG tablet TAKE 1 TABLET THREE TIMES A WEEK   Yes Satira Sark, MD  fluticasone [facility]) 50 MCG/ACT nasal spray Place 2 sprays into the nose as needed for allergies.    Yes Historical Provider, MD  furosemide (LASIX) 20 MG tablet Take 1 tablet (20 mg total) by mouth daily. 11/22/12  Yes Burna Forts Serpe, PA-C  gabapentin (NEURONTIN) 100 MG capsule Take 2 capsules (200 mg total) by mouth at bedtime. As tolerated 07/11/14  Yes Antony Contras, MD  levothyroxine (SYNTHROID, LEVOTHROID) 75 MCG tablet Take 75 mcg by mouth daily before breakfast.   Yes Historical Provider, MD  montelukast (SINGULAIR) 10 MG tablet Take 10 mg by mouth daily.     Yes Historical Provider, MD  Multiple Vitamins-Minerals (CVS SPECTRAVITE SENIOR) TABS Take 1 tablet by mouth daily.     Yes Historical  Provider, MD  nitrofurantoin (MACRODANTIN) 50 MG capsule Take 50 mg by mouth at bedtime.   Yes Historical Provider, MD  nitroGLYCERIN (NITROSTAT) 0.4 MG SL tablet Place 0.4 mg under the tongue every 5 (five) minutes as needed for chest pain. 09/12/12  Yes Donney Dice, PA-C  Probiotic Product (ALIGN PO) Take 1 tablet by mouth daily.    Yes Historical Provider, MD  topiramate (TOPAMAX) 50 MG tablet Takes 2 tablets in the morning and 1 tablet in the evening. 08/12/13  Yes Philmore Pali, NP  vitamin C (ASCORBIC ACID) 500 MG tablet Take 500 mg by mouth daily.    Yes Historical Provider, MD  vitamin E 400 UNIT capsule Take 400 Units by mouth daily.   Yes Historical Provider, MD  Cranberry-Vitamin C-Vitamin E  (CRANBERRY PLUS VITAMIN C) 4200-20-3 MG-MG-UNIT CAPS Take 1 capsule by mouth daily.    Historical Provider, MD  estradiol (ESTRACE) 0.1 MG/GM vaginal cream Place 4 g vaginally daily. Use twice a week    Historical Provider, MD  Glucosamine-Chondroitin-MSM [phone] MG TABS Take 1 tablet by mouth daily.    Historical Provider, MD  Hypromellose 0.3 % SOLN Apply to eye. Applies to both eyes daily    Historical Provider, MD    Allergies as of 08/29/2014 - Review Complete 08/29/2014  Allergen Reaction Noted  . Cephalexin Anaphylaxis and Swelling 02/25/2009  . Doxazosin mesylate Other (See Comments) 04/24/2013  . Codeine Other (See Comments) 02/25/2009  . Indomethacin Other (See Comments) 02/25/2009  . Ivp dye [iodinated diagnostic agents] Hives 04/24/2013  . Levofloxacin Other (See Comments) 02/25/2009  . Morphine Other (See Comments) 02/25/2009  . Sulfamethoxazole-trimethoprim Other (See Comments)   . Penicillins Other (See Comments) 02/25/2009    Family History  Problem Relation Age of Onset  . Colon cancer Neg Hx   . Stroke Father     History   Social History  . Marital Status: Widowed    Spouse Name: N/A    Number of Children: N/A  . Years of Education: N/A   Occupational History  . Not on file.   Social History Main Topics  . Smoking status: Never Smoker   . Smokeless tobacco: Never Used     Comment: tobacco use - no  . Alcohol Use: No  . Drug Use: No  . Sexual Activity: Not on file   Other Topics Concern  . Not on file   Social History Narrative   Retired, widowed, gets regular exercise.     Review of Systems: See HPI, otherwise negative ROS  Physical Exam: BP 126/78  Pulse 67  Temp(Src) 97.4 F (36.3 C) (Oral)  Ht 5\' 5"  (1.651 m)  Wt 189 lb (85.73 kg)  BMI 31.45 kg/m2 General:  Elderly lady resting comfortably. pleasant and cooperative in NAD Skin:  Intact without significant lesions or rashes. Eyes:  Sclera clear, no icterus.   Conjunctiva  pink. Ears:  Normal auditory acuity. Nose:  No deformity, discharge,  or lesions. Mouth:  No deformity or lesions. Neck:  Supple; no masses or thyromegaly. No significant cervical adenopathy. Lungs:  Clear throughout to auscultation.   No wheezes, crackles, or rhonchi. No acute distress. Heart:  Regular rate and rhythm; no murmurs, clicks, rubs,  or gallops. Abdomen: Non-distended, normal bowel sounds.  Soft and nontender without appreciable mass or hepatosplenomegaly.  Pulses:  Normal pulses noted. Extremities:  Without clubbing or edema.  Impression:   80 year old lady with long-standing GERD fairly well controlled on Dexilant. Sounds as  though she may have had a couple of days worth of nocturnal breakthrough symptoms. Gallbladder is out. She is doing well today. Has some intermittent hoarseness chronic cough which may or may not be at all related to reflux.  Recommendations:  Continue Dexilant daily  GERD information provided  Try taking Pepsid complete for any breakthrough symptoms  See Dr. Quintin Alto as needed for cough/hoarseness  Office follow-up in 6 months    Notice: This dictation was prepared with Dragon dictation along with smaller phrase technology. Any transcriptional errors that result from this process are unintentional and may not be corrected upon review.

## 2014-09-03 ENCOUNTER — Ambulatory Visit (INDEPENDENT_AMBULATORY_CARE_PROVIDER_SITE_OTHER): Payer: Medicare Other | Admitting: Cardiology

## 2014-09-03 ENCOUNTER — Encounter: Payer: Self-pay | Admitting: Cardiology

## 2014-09-03 VITALS — BP 108/74 | HR 76 | Ht 69.0 in | Wt 190.0 lb

## 2014-09-03 DIAGNOSIS — L723 Sebaceous cyst: Secondary | ICD-10-CM | POA: Diagnosis not present

## 2014-09-03 DIAGNOSIS — R55 Syncope and collapse: Secondary | ICD-10-CM | POA: Diagnosis not present

## 2014-09-03 DIAGNOSIS — I5032 Chronic diastolic (congestive) heart failure: Secondary | ICD-10-CM | POA: Diagnosis not present

## 2014-09-03 DIAGNOSIS — I340 Nonrheumatic mitral (valve) insufficiency: Secondary | ICD-10-CM

## 2014-09-03 DIAGNOSIS — I059 Rheumatic mitral valve disease, unspecified: Secondary | ICD-10-CM | POA: Diagnosis not present

## 2014-09-03 DIAGNOSIS — Z85828 Personal history of other malignant neoplasm of skin: Secondary | ICD-10-CM | POA: Diagnosis not present

## 2014-09-03 DIAGNOSIS — D1801 Hemangioma of skin and subcutaneous tissue: Secondary | ICD-10-CM | POA: Diagnosis not present

## 2014-09-03 DIAGNOSIS — L570 Actinic keratosis: Secondary | ICD-10-CM | POA: Diagnosis not present

## 2014-09-03 DIAGNOSIS — D485 Neoplasm of uncertain behavior of skin: Secondary | ICD-10-CM | POA: Diagnosis not present

## 2014-09-03 NOTE — Assessment & Plan Note (Signed)
Mild to moderate by most recent echocardiogram. No change on examination.

## 2014-09-03 NOTE — Assessment & Plan Note (Signed)
No recurrences.

## 2014-09-03 NOTE — Assessment & Plan Note (Signed)
Stable, volume status looks to be adequately controlled. No changes made in current regimen.

## 2014-09-03 NOTE — Progress Notes (Signed)
Clinical Summary Ms. Dede is an 80 y.o.female last seen in March. She reports no major changes over the last 6 months, no progressive shortness of breath and leg edema. Weight is stable. We reviewed her medications.  Followup echocardiogram in March of this year showed normal LV wall thickness with LVEF 69-62%, grade 2 diastolic dysfunction, upper normal left atrial chamber size, mild to moderate mitral regurgitation, mild RV dilatation with normal contraction, and moderate tricuspid regurgitation with PASP 49 mm mercury.   Allergies  Allergen Reactions  . Cephalexin Anaphylaxis and Swelling  . Doxazosin Mesylate Other (See Comments)    Made patient EXTREMELY sick.   . Codeine Other (See Comments)    Hallucinations  . Indomethacin Other (See Comments)    Severe Headache   . Ivp Dye [Iodinated Diagnostic Agents] Hives  . Levofloxacin Other (See Comments)    Unknown  . Morphine Other (See Comments)    Hallucinations   . Sulfamethoxazole-Trimethoprim Other (See Comments)    Unknown   . Penicillins Other (See Comments)    Made patient go into shock    Current Outpatient Prescriptions  Medication Sig Dispense Refill  . aspirin EC 81 MG tablet Take 1 tablet (81 mg total) by mouth daily.      . bisacodyl (BISACODYL) 5 MG EC tablet Take 5 mg by mouth daily as needed for constipation.      . Cholecalciferol (VITAMIN D-3) 1000 UNITS CAPS Take 1 capsule by mouth daily.      Marland Kitchen DEXILANT 60 MG capsule TAKE 1 CAPSULE DAILY  90 capsule  3  . estradiol (ESTRACE) 0.1 MG/GM vaginal cream Place 4 g vaginally daily. Use twice a week      . fexofenadine (ALLEGRA) 180 MG tablet Take 180 mg by mouth daily.      . fludrocortisone (FLORINEF) 0.1 MG tablet TAKE 1 TABLET THREE TIMES A WEEK  36 tablet  3  . fluticasone (FLONASE) 50 MCG/ACT nasal spray Place 2 sprays into the nose as needed for allergies.       . furosemide (LASIX) 20 MG tablet Take 20 mg by mouth daily. Pt states she does not take it  on Sundays and when she has a doctor's appt.      . gabapentin (NEURONTIN) 100 MG capsule Take 2 capsules (200 mg total) by mouth at bedtime. As tolerated  180 capsule  1  . Glucosamine-Chondroitin-MSM 750-400-375 MG TABS Take 1 tablet by mouth daily.      . Hypromellose 0.3 % SOLN Apply to eye. Applies to both eyes daily      . levothyroxine (SYNTHROID, LEVOTHROID) 75 MCG tablet Take 75 mcg by mouth daily before breakfast.      . mirabegron ER (MYRBETRIQ) 50 MG TB24 tablet Take 50 mg by mouth daily.      . montelukast (SINGULAIR) 10 MG tablet Take 10 mg by mouth daily.        . Multiple Vitamins-Minerals (CVS SPECTRAVITE SENIOR) TABS Take 1 tablet by mouth daily.        . nitrofurantoin (MACRODANTIN) 50 MG capsule Take 50 mg by mouth at bedtime.      . nitroGLYCERIN (NITROSTAT) 0.4 MG SL tablet Place 0.4 mg under the tongue every 5 (five) minutes as needed for chest pain.      . Omega-3 Fatty Acids (FISH OIL PO) Take by mouth daily.      . Probiotic Product (ALIGN PO) Take 1 tablet by mouth daily.       Marland Kitchen  topiramate (TOPAMAX) 50 MG tablet Takes 2 tablets in the morning and 1 tablet in the evening.  270 tablet  3  . vitamin C (ASCORBIC ACID) 500 MG tablet Take 500 mg by mouth daily.        No current facility-administered medications for this visit.    Past Medical History  Diagnosis Date  . Syncope     Neurocardiogenic - positive tilt table test, on Florinef  . Allergy to radiographic dye   . Diverticulosis of colon (without mention of hemorrhage)     TCS by Dr. Gala Romney  . Irritable bowel syndrome   . GERD (gastroesophageal reflux disease)   . Peripheral polyneuropathy     Confirmed by EMG and nerve conduction velocities   . Migraine   . Pulmonary nodule   . Mitral regurgitation   . Hemorrhoids, internal     TCS by Dr. Gala Romney  . Chronic diastolic heart failure   . Tubular adenoma     Social History Ms. Cieslak reports that she has never smoked. She has never used smokeless  tobacco. Ms. Manuele reports that she does not drink alcohol.  Review of Systems No palpitations, no syncope, no chest pain. Other systems reviewed and negative.  Physical Examination Filed Vitals:   09/03/14 1054  BP: 108/74  Pulse: 76   Filed Weights   09/03/14 1054  Weight: 190 lb (86.183 kg)    Patient appears comfortable at rest.  HEENT: Conjunctiva and lids normal, oropharynx clear.  Neck: Supple, no elevated JVP or carotid bruits, no thyromegaly.  Lungs: Clear to auscultation, nonlabored breathing at rest.  Cardiac: Regular rate and rhythm, soft apical systolic murmur, no S3.  Abdomen: Soft, nontender,bowel sounds present.  Extremities: No pitting edema, distal pulses 2+.    Problem List and Plan   Chronic diastolic heart failure Stable, volume status looks to be adequately controlled. No changes made in current regimen.  Mitral regurgitation Mild to moderate by most recent echocardiogram. No change on examination.  Vasovagal syncope No recurrences.    Satira Sark, M.D., F.A.C.C.

## 2014-09-03 NOTE — Patient Instructions (Signed)

## 2014-09-09 DIAGNOSIS — N952 Postmenopausal atrophic vaginitis: Secondary | ICD-10-CM | POA: Diagnosis not present

## 2014-09-09 DIAGNOSIS — R82998 Other abnormal findings in urine: Secondary | ICD-10-CM | POA: Diagnosis not present

## 2014-09-19 DIAGNOSIS — A498 Other bacterial infections of unspecified site: Secondary | ICD-10-CM | POA: Diagnosis not present

## 2014-09-19 DIAGNOSIS — R312 Other microscopic hematuria: Secondary | ICD-10-CM | POA: Diagnosis not present

## 2014-09-19 DIAGNOSIS — N3946 Mixed incontinence: Secondary | ICD-10-CM | POA: Diagnosis not present

## 2014-09-19 DIAGNOSIS — N952 Postmenopausal atrophic vaginitis: Secondary | ICD-10-CM | POA: Diagnosis not present

## 2014-09-19 DIAGNOSIS — N390 Urinary tract infection, site not specified: Secondary | ICD-10-CM | POA: Diagnosis not present

## 2014-09-19 DIAGNOSIS — B9629 Other Escherichia coli [E. coli] as the cause of diseases classified elsewhere: Secondary | ICD-10-CM | POA: Diagnosis not present

## 2014-09-19 DIAGNOSIS — R300 Dysuria: Secondary | ICD-10-CM | POA: Diagnosis not present

## 2014-10-01 DIAGNOSIS — K130 Diseases of lips: Secondary | ICD-10-CM | POA: Diagnosis not present

## 2014-10-03 ENCOUNTER — Other Ambulatory Visit: Payer: Self-pay

## 2014-10-07 DIAGNOSIS — Z23 Encounter for immunization: Secondary | ICD-10-CM | POA: Diagnosis not present

## 2014-10-09 DIAGNOSIS — R300 Dysuria: Secondary | ICD-10-CM | POA: Diagnosis not present

## 2014-10-09 DIAGNOSIS — R312 Other microscopic hematuria: Secondary | ICD-10-CM | POA: Diagnosis not present

## 2014-10-09 DIAGNOSIS — N952 Postmenopausal atrophic vaginitis: Secondary | ICD-10-CM | POA: Diagnosis not present

## 2014-10-09 DIAGNOSIS — N3281 Overactive bladder: Secondary | ICD-10-CM | POA: Diagnosis not present

## 2014-10-20 DIAGNOSIS — Z1231 Encounter for screening mammogram for malignant neoplasm of breast: Secondary | ICD-10-CM | POA: Diagnosis not present

## 2014-11-03 DIAGNOSIS — I70203 Unspecified atherosclerosis of native arteries of extremities, bilateral legs: Secondary | ICD-10-CM | POA: Diagnosis not present

## 2014-11-03 DIAGNOSIS — L84 Corns and callosities: Secondary | ICD-10-CM | POA: Diagnosis not present

## 2014-11-03 DIAGNOSIS — B351 Tinea unguium: Secondary | ICD-10-CM | POA: Diagnosis not present

## 2014-11-05 ENCOUNTER — Encounter: Payer: Self-pay | Admitting: Neurology

## 2014-11-19 DIAGNOSIS — J302 Other seasonal allergic rhinitis: Secondary | ICD-10-CM | POA: Diagnosis not present

## 2014-11-19 DIAGNOSIS — I872 Venous insufficiency (chronic) (peripheral): Secondary | ICD-10-CM | POA: Diagnosis not present

## 2014-12-14 ENCOUNTER — Other Ambulatory Visit: Payer: Self-pay | Admitting: Cardiology

## 2014-12-22 DIAGNOSIS — Z0000 Encounter for general adult medical examination without abnormal findings: Secondary | ICD-10-CM | POA: Diagnosis not present

## 2014-12-23 DIAGNOSIS — R300 Dysuria: Secondary | ICD-10-CM | POA: Diagnosis not present

## 2014-12-23 DIAGNOSIS — R351 Nocturia: Secondary | ICD-10-CM | POA: Diagnosis not present

## 2014-12-23 DIAGNOSIS — N312 Flaccid neuropathic bladder, not elsewhere classified: Secondary | ICD-10-CM | POA: Diagnosis not present

## 2014-12-23 DIAGNOSIS — Z87440 Personal history of urinary (tract) infections: Secondary | ICD-10-CM | POA: Diagnosis not present

## 2014-12-23 DIAGNOSIS — N3281 Overactive bladder: Secondary | ICD-10-CM | POA: Diagnosis not present

## 2015-01-06 DIAGNOSIS — R300 Dysuria: Secondary | ICD-10-CM | POA: Diagnosis not present

## 2015-01-06 DIAGNOSIS — R3915 Urgency of urination: Secondary | ICD-10-CM | POA: Diagnosis not present

## 2015-01-06 DIAGNOSIS — N3281 Overactive bladder: Secondary | ICD-10-CM | POA: Diagnosis not present

## 2015-01-06 DIAGNOSIS — R312 Other microscopic hematuria: Secondary | ICD-10-CM | POA: Diagnosis not present

## 2015-01-08 ENCOUNTER — Other Ambulatory Visit: Payer: Self-pay | Admitting: Neurology

## 2015-01-19 ENCOUNTER — Ambulatory Visit: Payer: Medicare Other | Admitting: Nurse Practitioner

## 2015-01-20 DIAGNOSIS — N3281 Overactive bladder: Secondary | ICD-10-CM | POA: Diagnosis not present

## 2015-01-20 DIAGNOSIS — N390 Urinary tract infection, site not specified: Secondary | ICD-10-CM | POA: Diagnosis not present

## 2015-01-20 DIAGNOSIS — R350 Frequency of micturition: Secondary | ICD-10-CM | POA: Diagnosis not present

## 2015-01-20 DIAGNOSIS — L700 Acne vulgaris: Secondary | ICD-10-CM | POA: Diagnosis not present

## 2015-01-20 DIAGNOSIS — H6123 Impacted cerumen, bilateral: Secondary | ICD-10-CM | POA: Diagnosis not present

## 2015-01-20 DIAGNOSIS — R300 Dysuria: Secondary | ICD-10-CM | POA: Diagnosis not present

## 2015-01-20 DIAGNOSIS — B9629 Other Escherichia coli [E. coli] as the cause of diseases classified elsewhere: Secondary | ICD-10-CM | POA: Diagnosis not present

## 2015-01-20 DIAGNOSIS — R312 Other microscopic hematuria: Secondary | ICD-10-CM | POA: Diagnosis not present

## 2015-01-20 DIAGNOSIS — R3915 Urgency of urination: Secondary | ICD-10-CM | POA: Diagnosis not present

## 2015-01-21 ENCOUNTER — Telehealth: Payer: Self-pay | Admitting: *Deleted

## 2015-01-21 NOTE — Telephone Encounter (Signed)
Called and rescheduled the appointment

## 2015-01-23 ENCOUNTER — Ambulatory Visit: Payer: Medicare Other | Admitting: Adult Health

## 2015-01-27 DIAGNOSIS — E039 Hypothyroidism, unspecified: Secondary | ICD-10-CM | POA: Diagnosis not present

## 2015-01-27 DIAGNOSIS — I872 Venous insufficiency (chronic) (peripheral): Secondary | ICD-10-CM | POA: Diagnosis not present

## 2015-01-27 DIAGNOSIS — M1990 Unspecified osteoarthritis, unspecified site: Secondary | ICD-10-CM | POA: Diagnosis not present

## 2015-01-27 DIAGNOSIS — K210 Gastro-esophageal reflux disease with esophagitis: Secondary | ICD-10-CM | POA: Diagnosis not present

## 2015-01-28 DIAGNOSIS — I70203 Unspecified atherosclerosis of native arteries of extremities, bilateral legs: Secondary | ICD-10-CM | POA: Diagnosis not present

## 2015-01-28 DIAGNOSIS — L84 Corns and callosities: Secondary | ICD-10-CM | POA: Diagnosis not present

## 2015-01-28 DIAGNOSIS — B351 Tinea unguium: Secondary | ICD-10-CM | POA: Diagnosis not present

## 2015-02-05 DIAGNOSIS — I872 Venous insufficiency (chronic) (peripheral): Secondary | ICD-10-CM | POA: Diagnosis not present

## 2015-02-05 DIAGNOSIS — E039 Hypothyroidism, unspecified: Secondary | ICD-10-CM | POA: Diagnosis not present

## 2015-02-05 DIAGNOSIS — Z1389 Encounter for screening for other disorder: Secondary | ICD-10-CM | POA: Diagnosis not present

## 2015-02-05 DIAGNOSIS — G40909 Epilepsy, unspecified, not intractable, without status epilepticus: Secondary | ICD-10-CM | POA: Diagnosis not present

## 2015-02-05 DIAGNOSIS — R0789 Other chest pain: Secondary | ICD-10-CM | POA: Diagnosis not present

## 2015-02-05 DIAGNOSIS — K210 Gastro-esophageal reflux disease with esophagitis: Secondary | ICD-10-CM | POA: Diagnosis not present

## 2015-02-10 DIAGNOSIS — R312 Other microscopic hematuria: Secondary | ICD-10-CM | POA: Diagnosis not present

## 2015-02-10 DIAGNOSIS — R300 Dysuria: Secondary | ICD-10-CM | POA: Diagnosis not present

## 2015-02-10 DIAGNOSIS — R3915 Urgency of urination: Secondary | ICD-10-CM | POA: Diagnosis not present

## 2015-02-10 DIAGNOSIS — N952 Postmenopausal atrophic vaginitis: Secondary | ICD-10-CM | POA: Diagnosis not present

## 2015-02-10 DIAGNOSIS — R350 Frequency of micturition: Secondary | ICD-10-CM | POA: Diagnosis not present

## 2015-02-11 ENCOUNTER — Ambulatory Visit (INDEPENDENT_AMBULATORY_CARE_PROVIDER_SITE_OTHER): Payer: Medicare Other | Admitting: Adult Health

## 2015-02-11 ENCOUNTER — Encounter: Payer: Self-pay | Admitting: Adult Health

## 2015-02-11 VITALS — BP 114/67 | HR 60 | Ht 66.0 in | Wt 186.0 lb

## 2015-02-11 DIAGNOSIS — G629 Polyneuropathy, unspecified: Secondary | ICD-10-CM

## 2015-02-11 DIAGNOSIS — R269 Unspecified abnormalities of gait and mobility: Secondary | ICD-10-CM | POA: Diagnosis not present

## 2015-02-11 DIAGNOSIS — R569 Unspecified convulsions: Secondary | ICD-10-CM | POA: Diagnosis not present

## 2015-02-11 NOTE — Patient Instructions (Signed)
Continue gabapentin and Topamax Try OTC Aspercream at night on lower legs for discomfort.  If early morning headaches persist please let us know.

## 2015-02-11 NOTE — Progress Notes (Signed)
I have read the note, and I agree with the clinical assessment and plan.  WILLIS,CHARLES KEITH   

## 2015-02-11 NOTE — Progress Notes (Signed)
PATIENT: [name] DOB: 06/10/1930  REASON FOR VISIT: follow up HISTORY FROM: patient  HISTORY OF PRESENT ILLNESS: [name] is a 81 year old female with a history of sensory neuropathy and seizures. She returns today for follow-up. She is currently taking Topamax and gabapentin. She states that the paresthesias in the feet have gotten worse. She states at night the numbness will travel to mid calf. She does has some mild calf pain. she states that she does not want to increase the medication because it causes drowsiness the next morning. She states that if she wakes up in the middle of the night she will have staggering with ambulation. She uses a cane at night.  She doesn't use any assistive device to ambulate during the day. She does state that she will feel unsteady at times. She states that she will also wake up with a dull headache but as the day goes on it resolves. She has not tried taking any medication for the headache. She isn't sure if she snores at night.  She denies any seizure like events.  HISTORY 07/11/14: PRIOR HPI (CM): 81 year old Caucasian female returns today for followup since her last visit on 05/23/12. for sensory polyneuropathy and syncopal or seizure-like activity. She has had no seizure activity. Her paresthesias and burning pain in her feet are stable. She reports having a dark red color to the bottom of her feet. Denies being restless at night but has increasing pain in her feet. She is taking Topamax which she is tolerating well and gabapentin she only takes 200mg  at night due to increase daytime sleepiness.She has not fallen and uses no assistive device. She remains independent in all ADL's. She has had 2 to 3 UTI's in the last few months and on several antibiotics. See ROS.  UPDATE 08/05/13 (LL): Returns for follow up since last visit on 01/07/13 for sensory polyneuropathy and seizure. She has had no seizure activity. Her paresthesias and burning pain in  her feet are stable. She reports having a dark red color to the bottom of her feet, redness at the end of the day comes up mid-calf. Has increasing pain in her feet, feels like her balance is worse some days. She is taking Topamax which she is tolerating well and gabapentin she only takes 200mg  at night due to increase daytime sleepiness. She has not fallen but stumbles now and uses cane as assistive device sometimes. Reports doing exercises in bed because she is not stable enough to do them standing. Had drug reaction to Trimethoprim (hurt all over) and since has had pain in right shoulder; Spoke to PCP about it.  Update 07/10/2014 : She returns for followup her last visit almost a year ago. She has had quite an eventful year with multiple health issues. She fell last October and fractured her back and rest. In April she felt sick with bronchitis. She subsequently had what sounds with a salivary gland infection. She seems to be doing reasonably all right now. She continues to have tingling and numbness in her feet which are bothersome. She takes Topamax 2 tablets in the day and one in the evening along with gabapentin 2 tablets at night. She has no trouble sleeping. She does stagger at times and occasionally falls but can catch herself. She rarely uses a cane. She has no difficulty with balance particularly at night.  REVIEW OF SYSTEMS: Out of a complete 14 system review of symptoms, the patient complains only of the  following symptoms, and all other reviewed systems are negative.  Fatigue, shortness of breath, chest pain, constipation, incontinence of bladder, frequency of urination, urgency, dizziness, headache, bruise easily  ALLERGIES: Allergies  Allergen Reactions  . Cephalexin Anaphylaxis and Swelling  . Doxazosin Mesylate Other (See Comments)    Made patient EXTREMELY sick.   . Codeine Other (See Comments)    Hallucinations  . Indomethacin Other (See Comments)    Severe Headache   .  Ivp Dye [Iodinated Diagnostic Agents] Hives  . Levofloxacin Other (See Comments)    Unknown  . Morphine Other (See Comments)    Hallucinations   . Sulfamethoxazole-Trimethoprim Other (See Comments)    Unknown   . Penicillins Other (See Comments)    Made patient go into shock    HOME MEDICATIONS: Outpatient Prescriptions Prior to Visit  Medication Sig Dispense Refill  . aspirin EC 81 MG tablet Take 1 tablet (81 mg total) by mouth daily.    . bisacodyl (BISACODYL) 5 MG EC tablet Take 5 mg by mouth daily as needed for constipation.    . Cholecalciferol (VITAMIN D-3) 1000 UNITS CAPS Take 1 capsule by mouth daily.    Marland Kitchen DEXILANT 60 MG capsule TAKE 1 CAPSULE DAILY 90 capsule 3  . estradiol (ESTRACE) 0.1 MG/GM vaginal cream Place 4 g vaginally daily. Use twice a week    . fexofenadine (ALLEGRA) 180 MG tablet Take 180 mg by mouth daily.    . fludrocortisone (FLORINEF) 0.1 MG tablet TAKE 1 TABLET THREE TIMES A WEEK 36 tablet 2  . fluticasone (FLONASE) 50 MCG/ACT nasal spray Place 2 sprays into the nose as needed for allergies.     . furosemide (LASIX) 20 MG tablet Take 20 mg by mouth daily. Pt states she does not take it on Sundays and when she has a doctor's appt.    . gabapentin (NEURONTIN) 100 MG capsule TAKE 2 CAPSULES (200 MG TOTAL) AT BEDTIME AS TOLERATED 180 capsule 0  . Glucosamine-Chondroitin-MSM 750-400-375 MG TABS Take 1 tablet by mouth daily.    . Hypromellose 0.3 % SOLN Apply to eye. Applies to both eyes daily    . levothyroxine (SYNTHROID, LEVOTHROID) 75 MCG tablet Take 75 mcg by mouth daily before breakfast.    . mirabegron ER (MYRBETRIQ) 50 MG TB24 tablet Take 50 mg by mouth daily.    . montelukast (SINGULAIR) 10 MG tablet Take 10 mg by mouth daily.      . Multiple Vitamins-Minerals (CVS SPECTRAVITE SENIOR) TABS Take 1 tablet by mouth daily.      . nitrofurantoin (MACRODANTIN) 50 MG capsule Take 50 mg by mouth at bedtime.    . nitroGLYCERIN (NITROSTAT) 0.4 MG SL tablet Place 0.4  mg under the tongue every 5 (five) minutes as needed for chest pain.    . Omega-3 Fatty Acids (FISH OIL PO) Take by mouth daily.    . Probiotic Product (ALIGN PO) Take 1 tablet by mouth daily.     Marland Kitchen topiramate (TOPAMAX) 50 MG tablet Takes 2 tablets in the morning and 1 tablet in the evening. 270 tablet 3  . vitamin C (ASCORBIC ACID) 500 MG tablet Take 500 mg by mouth daily.      No facility-administered medications prior to visit.    PAST MEDICAL HISTORY: Past Medical History  Diagnosis Date  . Syncope     Neurocardiogenic - positive tilt table test, on Florinef  . Allergy to radiographic dye   . Diverticulosis of colon (without mention of hemorrhage)  TCS by Dr. Gala Romney  . Irritable bowel syndrome   . GERD (gastroesophageal reflux disease)   . Peripheral polyneuropathy     Confirmed by EMG and nerve conduction velocities   . Migraine   . Pulmonary nodule   . Mitral regurgitation   . Hemorrhoids, internal     TCS by Dr. Gala Romney  . Chronic diastolic heart failure   . Tubular adenoma     PAST SURGICAL HISTORY: Past Surgical History  Procedure Laterality Date  . Wrist surgery    . Foot surgery    . Total abdominal hysterectomy    . Appendectomy    . Back surgery    . Back pain      going to chiropracator for back and hip pain  . Colonoscopy  06/16/2005    internal hemorrhoids, L side diverticula - Dr. Gala Romney  . Esophagogastroduodenoscopy  12/21/2006      Dr. Vivi Ferns esophagus/Patulous EG junction, small to moderate sized hiatal hernia,   multiple fundal gland type appearing polyps biopsied, otherwise normal  . Colonoscopy N/A 05/02/2013    Dr. Gala Romney- normal rectum, scattered L sided diverticula. bx= tubular adenoma    FAMILY HISTORY: Family History  Problem Relation Age of Onset  . Colon cancer Neg Hx   . Stroke Father     PHYSICAL EXAM  Filed Vitals:   02/11/15 1025  BP: 114/67  Pulse: 60  Height: 5\' 6"  (1.676 m)  Weight: 186 lb (84.369 kg)   Body mass  index is 30.04 kg/(m^2).  Generalized: Well developed, in no acute distress   Neurological examination  Mentation: Alert oriented to time, place, history taking. Follows all commands speech and language fluent Cranial nerve II-XII: Pupils were equal round reactive to light. Extraocular movements were full, visual field were full on confrontational test. Facial sensation and strength were normal. Uvula tongue midline. Head turning and shoulder shrug  were normal and symmetric. Motor: The motor testing reveals 5 over 5 strength of all 4 extremities. Good symmetric motor tone is noted throughout.  Sensory: Sensory testing is intact to soft touch on all 4 extremities. Decreased pinprick sensation and lower extremities. No evidence of extinction is noted.  Coordination: Cerebellar testing reveals good finger-nose-finger and heel-to-shin bilaterally.  Gait and station: Patient has a slightly wide-based gait. Tandem gait not attempted. Romberg is negative. No drift is seen.  Reflexes: Deep tendon reflexes are symmetric but depressed bilaterally    DIAGNOSTIC DATA (LABS, IMAGING, TESTING) - I reviewed patient records, labs, notes, testing and imaging myself where available.   ASSESSMENT AND PLAN 81 y.o. year old female  has a past medical history of Syncope; Allergy to radiographic dye; Diverticulosis of colon (without mention of hemorrhage); Irritable bowel syndrome; GERD (gastroesophageal reflux disease); Peripheral polyneuropathy; Migraine; Pulmonary nodule; Mitral regurgitation; Hemorrhoids, internal; Chronic diastolic heart failure; and Tubular adenoma. here with:  1. Sensory neuropathy 2. Abnormality of gait 3. Seizures  Patient does feel that her neuropathy has gotten slightly worse. However she does not want to increase her medication. She will continue taking gabapentin and Topamax as prescribed. I have recommended that she try over-the-counter Aspercreme to help with the discomfort at  night. The patient denies any recent seizures. I have advised the patient if her headaches get worse she should either let her primary care provider know or call our office. She will follow up in 6 months or sooner if needed.   Ward Givens, MSN, NP-C 02/11/2015, 10:27 AM [facility] [id number]  [address], [place], [place] [address] [phone]  Note: This document was prepared with digital dictation and possible smart phrase technology. Any transcriptional errors that result from this process are unintentional.

## 2015-02-19 ENCOUNTER — Encounter: Payer: Self-pay | Admitting: Internal Medicine

## 2015-03-02 ENCOUNTER — Encounter: Payer: Self-pay | Admitting: Cardiology

## 2015-03-02 ENCOUNTER — Ambulatory Visit (INDEPENDENT_AMBULATORY_CARE_PROVIDER_SITE_OTHER): Payer: Medicare Other | Admitting: Cardiology

## 2015-03-02 VITALS — BP 105/68 | HR 64 | Ht 68.0 in | Wt 189.8 lb

## 2015-03-02 DIAGNOSIS — R55 Syncope and collapse: Secondary | ICD-10-CM

## 2015-03-02 DIAGNOSIS — R079 Chest pain, unspecified: Secondary | ICD-10-CM

## 2015-03-02 DIAGNOSIS — R600 Localized edema: Secondary | ICD-10-CM | POA: Diagnosis not present

## 2015-03-02 DIAGNOSIS — I5032 Chronic diastolic (congestive) heart failure: Secondary | ICD-10-CM

## 2015-03-02 NOTE — Patient Instructions (Signed)

## 2015-03-02 NOTE — Progress Notes (Signed)
Cardiology Office Note  Date: 03/02/2015   ID: [name], DOB 1930/06/11, MRN [id number]  PCP: Manon Hilding, MD  Primary Cardiologist: Rozann Lesches, MD   Chief Complaint  Patient presents with  . Diastolic heart failure    History of Present Illness: [name] is an 81 y.o. female last seen in September 2015. She presents for a routine visit today. Reports stable NYHA class II dyspnea. No unusual weight changes, her weight is down 4 pounds from the last visit. We reviewed her medications which are also stable. No change in Lasix dose. She does report ankle edema at the end of the day, left worse than right. This is likely due to venous insufficiency and she also has prominent spider veins in that region.  She does not report any reproducible exertional angina. She has more atypical, sporatic, left-sided chest discomfort and also what sounds like thoracic vertebral disc disease.  Follow-up ECG is stable and noted below.  She had a recent health screening done through her church finding her carotid status to be within the "mild" range, moderate risk for osteoporosis, and increased BMI. She continues to follow with Dr. Quintin Alto.   Past Medical History  Diagnosis Date  . Syncope     Neurocardiogenic - positive tilt table test, on Florinef  . Allergy to radiographic dye   . Diverticulosis of colon (without mention of hemorrhage)     TCS by Dr. Gala Romney  . Irritable bowel syndrome   . GERD (gastroesophageal reflux disease)   . Peripheral polyneuropathy     Confirmed by EMG and nerve conduction velocities   . Migraine   . Pulmonary nodule   . Mitral regurgitation   . Hemorrhoids, internal     TCS by Dr. Gala Romney  . Chronic diastolic heart failure   . Tubular adenoma     Past Surgical History  Procedure Laterality Date  . Wrist surgery    . Foot surgery    . Total abdominal hysterectomy    . Appendectomy    . Back surgery    . Back pain      going to  chiropracator for back and hip pain  . Colonoscopy  06/16/2005    internal hemorrhoids, L side diverticula - Dr. Gala Romney  . Esophagogastroduodenoscopy  12/21/2006      Dr. Vivi Ferns esophagus/Patulous EG junction, small to moderate sized hiatal hernia,   multiple fundal gland type appearing polyps biopsied, otherwise normal  . Colonoscopy N/A 05/02/2013    Dr. Gala Romney- normal rectum, scattered L sided diverticula. bx= tubular adenoma    Current Outpatient Prescriptions  Medication Sig Dispense Refill  . aspirin EC 81 MG tablet Take 1 tablet (81 mg total) by mouth daily.    . bisacodyl (BISACODYL) 5 MG EC tablet Take 5 mg by mouth daily as needed for constipation.    . Cholecalciferol (VITAMIN D-3) 1000 UNITS CAPS Take 1 capsule by mouth daily.    Marland Kitchen DEXILANT 60 MG capsule TAKE 1 CAPSULE DAILY 90 capsule 3  . estradiol (ESTRACE) 0.1 MG/GM vaginal cream Place 4 g vaginally daily. Use twice a week    . fexofenadine (ALLEGRA) 180 MG tablet Take 180 mg by mouth daily.    . fludrocortisone (FLORINEF) 0.1 MG tablet TAKE 1 TABLET THREE TIMES A WEEK 36 tablet 2  . fluticasone (FLONASE) 50 MCG/ACT nasal spray Place 2 sprays into the nose as needed for allergies.     . furosemide (LASIX) 20 MG tablet Take  20 mg by mouth daily. Pt states she does not take it on Sundays and when she has a doctor's appt.    . gabapentin (NEURONTIN) 100 MG capsule TAKE 2 CAPSULES (200 MG TOTAL) AT BEDTIME AS TOLERATED 180 capsule 0  . Glucosamine-Chondroitin-MSM 750-400-375 MG TABS Take 1 tablet by mouth daily.    . Hypromellose 0.3 % SOLN Apply to eye. Applies to both eyes daily    . levothyroxine (SYNTHROID, LEVOTHROID) 75 MCG tablet Take 75 mcg by mouth daily before breakfast.    . mirabegron ER (MYRBETRIQ) 50 MG TB24 tablet Take 50 mg by mouth daily.    . montelukast (SINGULAIR) 10 MG tablet Take 10 mg by mouth daily.      . Multiple Vitamins-Minerals (CVS SPECTRAVITE SENIOR) TABS Take 1 tablet by mouth daily.      .  nitrofurantoin (MACRODANTIN) 50 MG capsule Take 50 mg by mouth at bedtime.    . nitroGLYCERIN (NITROSTAT) 0.4 MG SL tablet Place 0.4 mg under the tongue every 5 (five) minutes as needed for chest pain.    . Omega-3 Fatty Acids (FISH OIL PO) Take by mouth daily.    . Probiotic Product (ALIGN PO) Take 1 tablet by mouth daily.     Marland Kitchen topiramate (TOPAMAX) 50 MG tablet Takes 2 tablets in the morning and 1 tablet in the evening. 270 tablet 3  . vitamin C (ASCORBIC ACID) 500 MG tablet Take 500 mg by mouth daily.      No current facility-administered medications for this visit.    Allergies:  Cephalexin; Doxazosin mesylate; Codeine; Indomethacin; Ivp dye; Levofloxacin; Morphine; Sulfamethoxazole-trimethoprim; and Penicillins   Social History: The patient  reports that she has never smoked. She has never used smokeless tobacco. She reports that she does not drink alcohol or use illicit drugs.   ROS:  Please see the history of present illness. Otherwise, complete review of systems is positive for none.  All other systems are reviewed and negative.    Physical Exam: VS:  BP 105/68 mmHg  Pulse 64  Ht 5\' 8"  (1.727 m)  Wt 189 lb 12.8 oz (86.093 kg)  BMI 28.87 kg/m2  SpO2 99%, BMI Body mass index is 28.87 kg/(m^2).  Wt Readings from Last 3 Encounters:  03/02/15 189 lb 12.8 oz (86.093 kg)  02/11/15 186 lb (84.369 kg)  09/03/14 190 lb (86.183 kg)     Tall statured woman, appears comfortable at rest.  HEENT: Conjunctiva and lids normal, oropharynx clear.  Neck: Supple, no elevated JVP or carotid bruits, prominent right sided carotid pulsation likely tortuous section of the vessel, no thyromegaly.  Lungs: Clear to auscultation, nonlabored breathing at rest.  Cardiac: Regular rate and rhythm, soft apical systolic murmur, no S3.  Abdomen: Soft, nontender,bowel sounds present.  Extremities: No pitting edema, prominent spider veins and ankle region left greater than right, distal pulses 2+.     ECG: ECG is ordered today and reviewed showing normal sinus rhythm.   Other Studies Reviewed Today:  Followup echocardiogram in March 2015 showed normal LV wall thickness with LVEF 09-62%, grade 2 diastolic dysfunction, upper normal left atrial chamber size, mild to moderate mitral regurgitation, mild RV dilatation with normal contraction, and moderate tricuspid regurgitation with PASP 49 mm mercury.  Assessment and Plan:  1. History of diastolic heart failure, clinically stable, weight is down from the last visit. No change in current diuretic regimen including Lasix, blood pressure is normal today as well.  2. Intermittent leg edema likely related  to venous insufficiency. We did discuss use of compression stockings, but she states that she has difficulty getting these on due to arthritis in her hands.  3. History of neurocardiogenic syncope, no recurrences. She continues on Florinef.  Current medicines are reviewed at length with the patient today.  The patient does not have concerns regarding medicines.   Orders Placed This Encounter  Procedures  . EKG 12-Lead    Disposition: FU with me in 6 months.   Signed, Satira Sark, MD, [facility] 03/02/2015 9:22 AM    [facility] [place], [place], [place] [address] Phone: [phone]; Fax: [phone]

## 2015-03-03 DIAGNOSIS — L0100 Impetigo, unspecified: Secondary | ICD-10-CM | POA: Diagnosis not present

## 2015-03-03 DIAGNOSIS — L570 Actinic keratosis: Secondary | ICD-10-CM | POA: Diagnosis not present

## 2015-03-30 ENCOUNTER — Telehealth: Payer: Self-pay | Admitting: Internal Medicine

## 2015-03-30 NOTE — Telephone Encounter (Signed)
I spoke with the [name]- [name] is still c/o hoarseness and a dry cough. She is also c/o nasal drainage. She said this has been going on for several months. I checked the pts last ov note and informed her that RMR had advised her to see her pcp for hoarseness and cough. [name] said she has an ov with pcp in June. [name] is still taking dexilant. She is also taking allegra, singulair and flonase for her allergies. She has an appt with [place] on May 20th.   She wants to know if her hoarseness and cough are due to her hernia and does she need to keep her appt in May?

## 2015-03-30 NOTE — Telephone Encounter (Signed)
Pt called today saying that she is being treated for GERD and recently has had hoarseness with coughing and doesn't know if this could be related to her GERD. I offered her OV with RMR on 5/20 at 11 am.  She said that she is suppose to come back in Sept with her sister (they come to OV together to see RMR only). I told her that I could have nurse call her back and see if she needs to come in sooner than September, but it was up to the patient if she wanted to be seen. She would rather hold OV with RMR until she hears from nurse. Please call [phone]

## 2015-04-02 NOTE — Telephone Encounter (Signed)
Looked at this communication earlier, not sure if I responded to it. See PCP for hoarseness. Keep appointments here as scheduled

## 2015-04-06 NOTE — Telephone Encounter (Signed)
Pt is aware of recommendations

## 2015-04-13 ENCOUNTER — Other Ambulatory Visit: Payer: Self-pay | Admitting: Neurology

## 2015-04-22 DIAGNOSIS — B351 Tinea unguium: Secondary | ICD-10-CM | POA: Diagnosis not present

## 2015-04-22 DIAGNOSIS — L84 Corns and callosities: Secondary | ICD-10-CM | POA: Diagnosis not present

## 2015-04-22 DIAGNOSIS — I70203 Unspecified atherosclerosis of native arteries of extremities, bilateral legs: Secondary | ICD-10-CM | POA: Diagnosis not present

## 2015-05-08 ENCOUNTER — Encounter: Payer: Self-pay | Admitting: Internal Medicine

## 2015-05-08 ENCOUNTER — Ambulatory Visit (INDEPENDENT_AMBULATORY_CARE_PROVIDER_SITE_OTHER): Payer: Medicare Other | Admitting: Internal Medicine

## 2015-05-08 VITALS — BP 136/79 | HR 72 | Temp 98.1°F | Ht 71.0 in | Wt 188.8 lb

## 2015-05-08 DIAGNOSIS — K5730 Diverticulosis of large intestine without perforation or abscess without bleeding: Secondary | ICD-10-CM

## 2015-05-08 DIAGNOSIS — K449 Diaphragmatic hernia without obstruction or gangrene: Secondary | ICD-10-CM

## 2015-05-08 DIAGNOSIS — K219 Gastro-esophageal reflux disease without esophagitis: Secondary | ICD-10-CM

## 2015-05-08 NOTE — Patient Instructions (Signed)
Continue Dexilant 60 mg daily  Take benefiber 2 teaspoons twice daily  Office visit with [place] in 6 months

## 2015-05-08 NOTE — Progress Notes (Signed)
Primary Care Physician:  Manon Hilding, MD Primary Gastroenterologist:  Dr. Gala Romney  Pre-Procedure History & Physical: HPI:  [name] is a 81 y.o. female here for follow-up of GERD and constipation.  Reflux symptoms well controlled on Dexilant 60 mg daily.  Still has a chronic cough which may or may not be reflux related. No dysphagia. Strains occasionally to have a bowel movement has some transient abdominal discomfort after moving her bowels. Not more than a minute or so. No hematochezia or melena.  Patient admits to not being on any fiber supplement as previously recommended.  Past Medical History  Diagnosis Date  . Syncope     Neurocardiogenic - positive tilt table test, on Florinef  . Allergy to radiographic dye   . Diverticulosis of colon (without mention of hemorrhage)     TCS by Dr. Gala Romney  . Irritable bowel syndrome   . GERD (gastroesophageal reflux disease)   . Peripheral polyneuropathy     Confirmed by EMG and nerve conduction velocities   . Migraine   . Pulmonary nodule   . Mitral regurgitation   . Hemorrhoids, internal     TCS by Dr. Gala Romney  . Chronic diastolic heart failure   . Tubular adenoma     Past Surgical History  Procedure Laterality Date  . Wrist surgery    . Foot surgery    . Total abdominal hysterectomy    . Appendectomy    . Back surgery    . Back pain      going to chiropracator for back and hip pain  . Colonoscopy  06/16/2005    internal hemorrhoids, L side diverticula - Dr. Gala Romney  . Esophagogastroduodenoscopy  12/21/2006      Dr. Vivi Ferns esophagus/Patulous EG junction, small to moderate sized hiatal hernia,   multiple fundal gland type appearing polyps biopsied, otherwise normal  . Colonoscopy N/A 05/02/2013    Dr. Gala Romney- normal rectum, scattered L sided diverticula. bx= tubular adenoma    Prior to Admission medications   Medication Sig Start Date End Date Taking? Authorizing Provider  aspirin EC 81 MG tablet Take 1 tablet (81 mg  total) by mouth daily. 09/12/12   Donney Dice, PA-C  bisacodyl (BISACODYL) 5 MG EC tablet Take 5 mg by mouth daily as needed for constipation.    Historical Provider, MD  Cholecalciferol (VITAMIN D-3) 1000 UNITS CAPS Take 1 capsule by mouth daily.    Historical Provider, MD  DEXILANT 60 MG capsule TAKE 1 CAPSULE DAILY    Mahala Menghini, PA-C  estradiol (ESTRACE) 0.1 MG/GM vaginal cream Place 4 g vaginally daily. Use twice a week    Historical Provider, MD  fexofenadine (ALLEGRA) 180 MG tablet Take 180 mg by mouth daily.    Historical Provider, MD  fludrocortisone (FLORINEF) 0.1 MG tablet TAKE 1 TABLET THREE TIMES A WEEK 12/15/14   Satira Sark, MD  fluticasone [facility]) 50 MCG/ACT nasal spray Place 2 sprays into the nose as needed for allergies.     Historical Provider, MD  furosemide (LASIX) 20 MG tablet Take 20 mg by mouth daily. Pt states she does not take it on Sundays and when she has a doctor's appt. 11/22/12   Donney Dice, PA-C  gabapentin (NEURONTIN) 100 MG capsule TAKE 2 CAPSULES (200MG ) AT BEDTIME AS TOLERATED 04/13/15   Garvin Fila, MD  Glucosamine-Chondroitin-MSM [phone] MG TABS Take 1 tablet by mouth daily.    Historical Provider, MD  Hypromellose 0.3 % SOLN  Apply to eye. Applies to both eyes daily    Historical Provider, MD  levothyroxine (SYNTHROID, LEVOTHROID) 75 MCG tablet Take 75 mcg by mouth daily before breakfast.    Historical Provider, MD  mirabegron ER (MYRBETRIQ) 50 MG TB24 tablet Take 50 mg by mouth daily.    Historical Provider, MD  montelukast (SINGULAIR) 10 MG tablet Take 10 mg by mouth daily.      Historical Provider, MD  Multiple Vitamins-Minerals ([employer] SPECTRAVITE SENIOR) TABS Take 1 tablet by mouth daily.      Historical Provider, MD  nitrofurantoin (MACRODANTIN) 50 MG capsule Take 50 mg by mouth at bedtime.    Historical Provider, MD  nitroGLYCERIN (NITROSTAT) 0.4 MG SL tablet Place 0.4 mg under the tongue every 5 (five) minutes as needed for chest  pain. 09/12/12   Donney Dice, PA-C  Omega-3 Fatty Acids (FISH OIL PO) Take by mouth daily.    Historical Provider, MD  Probiotic Product (ALIGN PO) Take 1 tablet by mouth daily.     Historical Provider, MD  topiramate (TOPAMAX) 50 MG tablet Takes 2 tablets in the morning and 1 tablet in the evening. 08/12/13   Philmore Pali, NP  vitamin C (ASCORBIC ACID) 500 MG tablet Take 500 mg by mouth daily.     Historical Provider, MD    Allergies as of 05/08/2015 - Review Complete 03/02/2015  Allergen Reaction Noted  . Cephalexin Anaphylaxis and Swelling 02/25/2009  . Doxazosin mesylate Other (See Comments) 04/24/2013  . Codeine Other (See Comments) 02/25/2009  . Indomethacin Other (See Comments) 02/25/2009  . Ivp dye [iodinated diagnostic agents] Hives 04/24/2013  . Levofloxacin Other (See Comments) 02/25/2009  . Morphine Other (See Comments) 02/25/2009  . Sulfamethoxazole-trimethoprim Other (See Comments)   . Penicillins Other (See Comments) 02/25/2009    Family History  Problem Relation Age of Onset  . Colon cancer Neg Hx   . Stroke Father     History   Social History  . Marital Status: Widowed    Spouse Name: N/A  . Number of Children: N/A  . Years of Education: N/A   Occupational History  . Not on file.   Social History Main Topics  . Smoking status: Never Smoker   . Smokeless tobacco: Never Used     Comment: tobacco use - no  . Alcohol Use: No  . Drug Use: No  . Sexual Activity: Not on file   Other Topics Concern  . Not on file   Social History Narrative   Retired, widowed, gets regular exercise.     Review of Systems: See HPI, otherwise negative ROS  Physical Exam: There were no vitals taken for this visit. General:  Elderly, somewhat frail appearing lady; cooperative in NAD Skin:  Intact without significant lesions or rashes. Eyes:  Sclera clear, no icterus.   Conjunctiva pink. Ears:  Normal auditory acuity. Nose:  No deformity, discharge,  or lesions. Mouth:   No deformity or lesions. Abdomen: Non-distended, normal bowel sounds.  Soft and nontender without appreciable mass or hepatosplenomegaly.  Pulses:  Normal pulses noted. Extremities:  Without clubbing or edema.  Impression:  Pleasant 81 year old lady with long-standing GERD- well controlled on Dexilant. She has chronic cough which I feel is less likely related to reflux at this time. She does not have any alarm symptoms.  Some degree constipation without frank abdominal pain in the setting of known diverticulosis. No fiber supplementation.  Recommendations:  Continue Dexilant 60 mg daily  Take benefiber 2 teaspoons  twice daily  Office visit with [place] in 6 months     Notice: This dictation was prepared with Dragon dictation along with smaller phrase technology. Any transcriptional errors that result from this process are unintentional and may not be corrected upon review.

## 2015-05-12 DIAGNOSIS — Z87440 Personal history of urinary (tract) infections: Secondary | ICD-10-CM | POA: Diagnosis not present

## 2015-05-12 DIAGNOSIS — N3281 Overactive bladder: Secondary | ICD-10-CM | POA: Diagnosis not present

## 2015-05-12 DIAGNOSIS — Z79899 Other long term (current) drug therapy: Secondary | ICD-10-CM | POA: Diagnosis not present

## 2015-05-12 DIAGNOSIS — N3941 Urge incontinence: Secondary | ICD-10-CM | POA: Diagnosis not present

## 2015-05-12 DIAGNOSIS — R3915 Urgency of urination: Secondary | ICD-10-CM | POA: Diagnosis not present

## 2015-05-12 DIAGNOSIS — R350 Frequency of micturition: Secondary | ICD-10-CM | POA: Diagnosis not present

## 2015-05-12 DIAGNOSIS — N952 Postmenopausal atrophic vaginitis: Secondary | ICD-10-CM | POA: Diagnosis not present

## 2015-05-13 ENCOUNTER — Other Ambulatory Visit: Payer: Self-pay | Admitting: Gastroenterology

## 2015-05-25 DIAGNOSIS — E039 Hypothyroidism, unspecified: Secondary | ICD-10-CM | POA: Diagnosis not present

## 2015-05-25 DIAGNOSIS — E669 Obesity, unspecified: Secondary | ICD-10-CM | POA: Diagnosis not present

## 2015-05-25 DIAGNOSIS — K210 Gastro-esophageal reflux disease with esophagitis: Secondary | ICD-10-CM | POA: Diagnosis not present

## 2015-06-01 DIAGNOSIS — I872 Venous insufficiency (chronic) (peripheral): Secondary | ICD-10-CM | POA: Diagnosis not present

## 2015-06-01 DIAGNOSIS — G40909 Epilepsy, unspecified, not intractable, without status epilepticus: Secondary | ICD-10-CM | POA: Diagnosis not present

## 2015-06-01 DIAGNOSIS — J301 Allergic rhinitis due to pollen: Secondary | ICD-10-CM | POA: Diagnosis not present

## 2015-06-01 DIAGNOSIS — R5383 Other fatigue: Secondary | ICD-10-CM | POA: Diagnosis not present

## 2015-06-01 DIAGNOSIS — K210 Gastro-esophageal reflux disease with esophagitis: Secondary | ICD-10-CM | POA: Diagnosis not present

## 2015-06-01 DIAGNOSIS — E039 Hypothyroidism, unspecified: Secondary | ICD-10-CM | POA: Diagnosis not present

## 2015-06-15 ENCOUNTER — Other Ambulatory Visit: Payer: Self-pay

## 2015-07-01 DIAGNOSIS — I70203 Unspecified atherosclerosis of native arteries of extremities, bilateral legs: Secondary | ICD-10-CM | POA: Diagnosis not present

## 2015-07-01 DIAGNOSIS — L84 Corns and callosities: Secondary | ICD-10-CM | POA: Diagnosis not present

## 2015-07-01 DIAGNOSIS — B351 Tinea unguium: Secondary | ICD-10-CM | POA: Diagnosis not present

## 2015-07-21 ENCOUNTER — Encounter: Payer: Self-pay | Admitting: Internal Medicine

## 2015-07-30 DIAGNOSIS — Z01419 Encounter for gynecological examination (general) (routine) without abnormal findings: Secondary | ICD-10-CM | POA: Diagnosis not present

## 2015-07-30 DIAGNOSIS — Z6827 Body mass index (BMI) 27.0-27.9, adult: Secondary | ICD-10-CM | POA: Diagnosis not present

## 2015-08-07 ENCOUNTER — Other Ambulatory Visit: Payer: Self-pay | Admitting: Cardiology

## 2015-08-11 DIAGNOSIS — R350 Frequency of micturition: Secondary | ICD-10-CM | POA: Diagnosis not present

## 2015-08-11 DIAGNOSIS — N952 Postmenopausal atrophic vaginitis: Secondary | ICD-10-CM | POA: Diagnosis not present

## 2015-08-11 DIAGNOSIS — N3281 Overactive bladder: Secondary | ICD-10-CM | POA: Diagnosis not present

## 2015-08-11 DIAGNOSIS — N3941 Urge incontinence: Secondary | ICD-10-CM | POA: Diagnosis not present

## 2015-08-11 DIAGNOSIS — R3915 Urgency of urination: Secondary | ICD-10-CM | POA: Diagnosis not present

## 2015-08-17 ENCOUNTER — Encounter: Payer: Self-pay | Admitting: Adult Health

## 2015-08-17 ENCOUNTER — Ambulatory Visit (INDEPENDENT_AMBULATORY_CARE_PROVIDER_SITE_OTHER): Payer: Medicare Other | Admitting: Adult Health

## 2015-08-17 VITALS — BP 107/63 | HR 61 | Ht 69.0 in | Wt 191.0 lb

## 2015-08-17 DIAGNOSIS — G629 Polyneuropathy, unspecified: Secondary | ICD-10-CM | POA: Diagnosis not present

## 2015-08-17 DIAGNOSIS — R51 Headache: Secondary | ICD-10-CM

## 2015-08-17 DIAGNOSIS — R569 Unspecified convulsions: Secondary | ICD-10-CM

## 2015-08-17 DIAGNOSIS — R519 Headache, unspecified: Secondary | ICD-10-CM

## 2015-08-17 NOTE — Progress Notes (Signed)
PATIENT: [name] DOB: [birth year]  REASON FOR VISIT: follow up- sensory neuropathy, seizures HISTORY FROM: patient  HISTORY OF PRESENT ILLNESS:  [name] is an 81 year old female with a history of sensory neuropathy and seizures. She returns today for follow-up. She continues to take Topamax and gabapentin. At the last visit it was recommended that she try Aspercreme for her paresthesias in the feet. She states that this has been beneficial. She still feels that she has a staggering gait. She only uses a cane at bedtime. She does not want to use it during the day. She denies any falls. She does report that in the last 2-3 weeks she's been having "partial numbness" on the right side of the face near the eye in the temporal region. She states that this is accompanied by a dull discomfort. She states that she has been under more stress in the last several weeks due to a death in the family. She is unsure if stress is causing the symptoms. She states that she does have some changes in her vision when she is driving although her description is very vague. She plans to make an appointment with her ophthalmologist. Denies any weakness. Denies a facial droop. She is unsure how long the "partial numbness" and dull discomfort last. She is able to tell me that she usually wakes up with it in the middle the night. Patient is having a hard time describing her symptoms in detail. She denies any seizure events. She states that she is only having 1 seizure event in her lifetime and at that time she was taken to the hospital and does not recall what happened. She returns today for an evaluation.  HISTORY: [name] is a 82 year old female with a history of sensory neuropathy and seizures. She returns today for follow-up. She is currently taking Topamax and gabapentin. She states that the paresthesias in the feet have gotten worse. She states at night the numbness will travel to mid calf. She does has some  mild calf pain. she states that she does not want to increase the medication because it causes drowsiness the next morning. She states that if she wakes up in the middle of the night she will have staggering with ambulation. She uses a cane at night. She doesn't use any assistive device to ambulate during the day. She does state that she will feel unsteady at times. She states that she will also wake up with a dull headache but as the day goes on it resolves. She has not tried taking any medication for the headache. She isn't sure if she snores at night. She denies any seizure like events.  HISTORY 07/11/14: PRIOR HPI (CM): 81 year old Caucasian female returns today for followup since her last visit on 05/23/12. for sensory polyneuropathy and syncopal or seizure-like activity. She has had no seizure activity. Her paresthesias and burning pain in her feet are stable. She reports having a dark red color to the bottom of her feet. Denies being restless at night but has increasing pain in her feet. She is taking Topamax which she is tolerating well and gabapentin she only takes 200mg  at night due to increase daytime sleepiness.She has not fallen and uses no assistive device. She remains independent in all ADL's. She has had 2 to 3 UTI's in the last few months and on several antibiotics. See ROS.  UPDATE 08/05/13 (LL): Returns for follow up since last visit on 01/07/13 for sensory polyneuropathy and seizure.  She has had no seizure activity. Her paresthesias and burning pain in her feet are stable. She reports having a dark red color to the bottom of her feet, redness at the end of the day comes up mid-calf. Has increasing pain in her feet, feels like her balance is worse some days. She is taking Topamax which she is tolerating well and gabapentin she only takes 200mg  at night due to increase daytime sleepiness. She has not fallen but stumbles now and uses cane as assistive device sometimes. Reports doing  exercises in bed because she is not stable enough to do them standing. Had drug reaction to Trimethoprim (hurt all over) and since has had pain in right shoulder; Spoke to PCP about it.  Update 07/10/2014 : She returns for followup her last visit almost a year ago. She has had quite an eventful year with multiple health issues. She fell last October and fractured her back and rest. In April she felt sick with bronchitis. She subsequently had what sounds with a salivary gland infection. She seems to be doing reasonably all right now. She continues to have tingling and numbness in her feet which are bothersome. She takes Topamax 2 tablets in the day and one in the evening along with gabapentin 2 tablets at night. She has no trouble sleeping. She does stagger at times and occasionally falls but can catch herself. She rarely uses a cane. She has no difficulty with balance particularly at night.  REVIEW OF SYSTEMS: Out of a complete 14 system review of symptoms, the patient complains only of the following symptoms, and all other reviewed systems are negative.  Incontinence of bladder, frequency of urination, urgency, chest pain, leg swelling, drooling, ear pain, eye itching, dizziness, numbness  ALLERGIES: Allergies  Allergen Reactions  . Cephalexin Anaphylaxis and Swelling  . Doxazosin Mesylate Other (See Comments)    Made patient EXTREMELY sick.   . Codeine Other (See Comments)    Hallucinations  . Indomethacin Other (See Comments)    Severe Headache   . Ivp Dye [Iodinated Diagnostic Agents] Hives  . Levofloxacin Other (See Comments)    Unknown  . Morphine Other (See Comments)    Hallucinations   . Sulfamethoxazole-Trimethoprim Other (See Comments)    Unknown   . Penicillins Other (See Comments)    Made patient go into shock    HOME MEDICATIONS: Outpatient Prescriptions Prior to Visit  Medication Sig Dispense Refill  . aspirin EC 81 MG tablet Take 1 tablet (81 mg total) by mouth daily.     . bisacodyl (BISACODYL) 5 MG EC tablet Take 5 mg by mouth daily as needed for constipation.    . Cholecalciferol (VITAMIN D-3) 1000 UNITS CAPS Take 1 capsule by mouth daily.    Marland Kitchen DEXILANT 60 MG capsule TAKE 1 CAPSULE DAILY 90 capsule 3  . estradiol (ESTRACE) 0.1 MG/GM vaginal cream Place 4 g vaginally daily. Use twice a week    . fexofenadine (ALLEGRA) 180 MG tablet Take 180 mg by mouth daily.    . fludrocortisone (FLORINEF) 0.1 MG tablet TAKE 1 TABLET THREE TIMES A WEEK 36 tablet 1  . fluticasone (FLONASE) 50 MCG/ACT nasal spray Place 2 sprays into the nose as needed for allergies.     . furosemide (LASIX) 20 MG tablet Take 20 mg by mouth daily. Pt states she does not take it on Sundays and when she has a doctor's appt.    . gabapentin (NEURONTIN) 100 MG capsule TAKE 2 CAPSULES (200MG ) AT  BEDTIME AS TOLERATED 180 capsule 1  . Glucosamine-Chondroitin-MSM 750-400-375 MG TABS Take 1 tablet by mouth daily.    . Hypromellose 0.3 % SOLN Apply to eye. Applies to both eyes daily    . levothyroxine (SYNTHROID, LEVOTHROID) 75 MCG tablet Take 75 mcg by mouth daily before breakfast.    . mirabegron ER (MYRBETRIQ) 50 MG TB24 tablet Take 50 mg by mouth daily.    . montelukast (SINGULAIR) 10 MG tablet Take 10 mg by mouth daily.      . Multiple Vitamins-Minerals (CVS SPECTRAVITE SENIOR) TABS Take 1 tablet by mouth daily.      . nitrofurantoin (MACRODANTIN) 50 MG capsule Take 50 mg by mouth at bedtime.    . nitroGLYCERIN (NITROSTAT) 0.4 MG SL tablet Place 0.4 mg under the tongue every 5 (five) minutes as needed for chest pain.    . Omega-3 Fatty Acids (FISH OIL PO) Take by mouth daily.    . Probiotic Product (ALIGN PO) Take 1 tablet by mouth daily.     Marland Kitchen topiramate (TOPAMAX) 50 MG tablet Takes 2 tablets in the morning and 1 tablet in the evening. 270 tablet 3  . vitamin C (ASCORBIC ACID) 500 MG tablet Take 500 mg by mouth daily.      No facility-administered medications prior to visit.    PAST MEDICAL  HISTORY: Past Medical History  Diagnosis Date  . Syncope     Neurocardiogenic - positive tilt table test, on Florinef  . Allergy to radiographic dye   . Diverticulosis of colon (without mention of hemorrhage)     TCS by Dr. Gala Romney  . Irritable bowel syndrome   . GERD (gastroesophageal reflux disease)   . Peripheral polyneuropathy     Confirmed by EMG and nerve conduction velocities   . Migraine   . Pulmonary nodule   . Mitral regurgitation   . Hemorrhoids, internal     TCS by Dr. Gala Romney  . Chronic diastolic heart failure   . Tubular adenoma     PAST SURGICAL HISTORY: Past Surgical History  Procedure Laterality Date  . Wrist surgery    . Foot surgery    . Total abdominal hysterectomy    . Appendectomy    . Back surgery    . Back pain      going to chiropracator for back and hip pain  . Colonoscopy  06/16/2005    internal hemorrhoids, L side diverticula - Dr. Gala Romney  . Esophagogastroduodenoscopy  12/21/2006      Dr. Vivi Ferns esophagus/Patulous EG junction, small to moderate sized hiatal hernia,   multiple fundal gland type appearing polyps biopsied, otherwise normal  . Colonoscopy N/A 05/02/2013    Dr. Gala Romney- normal rectum, scattered L sided diverticula. bx= tubular adenoma    FAMILY HISTORY: Family History  Problem Relation Age of Onset  . Colon cancer Neg Hx   . Stroke Father     SOCIAL HISTORY: Social History   Social History  . Marital Status: Widowed    Spouse Name: N/A  . Number of Children: 4  . Years of Education: college   Occupational History  .      Retired   Social History Main Topics  . Smoking status: Never Smoker   . Smokeless tobacco: Never Used     Comment: tobacco use - no  . Alcohol Use: No  . Drug Use: No  . Sexual Activity: Not on file   Other Topics Concern  . Not on file   Social History  Narrative   Retired, widowed, education college education .   Right handed.   Caffeine one cup of coffee daily.       PHYSICAL  EXAM  Filed Vitals:   08/17/15 1027  BP: 107/63  Pulse: 61  Height: 5\' 9"  (1.753 m)  Weight: 191 lb (86.637 kg)   Body mass index is 28.19 kg/(m^2).  Generalized: Well developed, in no acute distress   Neurological examination  Mentation: Alert oriented to time, place, history taking. Follows all commands speech and language fluent Cranial nerve II-XII: Pupils were equal round reactive to light. Extraocular movements were full, visual field were full on confrontational test. Facial sensation and strength were normal. Uvula tongue midline. Head turning and shoulder shrug  were normal and symmetric. Motor: The motor testing reveals 5 over 5 strength of all 4 extremities. Good symmetric motor tone is noted throughout.  Sensory: Sensory testing is intact to soft touch on all 4 extremities. No evidence of extinction is noted.  Coordination: Cerebellar testing reveals good finger-nose-finger and heel-to-shin bilaterally.  Gait and station. Patient's gait is very slow and cautious. Tandem gait not attempted. Romberg is negative.  Reflexes: Deep tendon reflexes are symmetric and normal bilaterally.   DIAGNOSTIC DATA (LABS, IMAGING, TESTING) - I reviewed patient records, labs, notes, testing and imaging myself where available.   ASSESSMENT AND PLAN 81 y.o. year old female  has a past medical history of Syncope; Allergy to radiographic dye; Diverticulosis of colon (without mention of hemorrhage); Irritable bowel syndrome; GERD (gastroesophageal reflux disease); Peripheral polyneuropathy; Migraine; Pulmonary nodule; Mitral regurgitation; Hemorrhoids, internal; Chronic diastolic heart failure; and Tubular adenoma. here with:  1. Sensory neuropathy 2. Seizures 3. Headache, sensory changes in the temporal region  The patient will continue using Topamax and gabapentin. She denies any seizure events. The patient did try Aspercreme and finds it beneficial for her sensory neuropathy. She will continue  to use this as needed. The patient does report a new complaint of headache in the temporal region associated with some sensory changes. Although the patient doesn't describe it as a headache but rather a dull discomfort. I will check a sedimentation rate to rule out possible temporal arteritis pending these results we'll consider adjusting her medication. She will follow-up in 3-4 months with Dr. Leonie Man.     Ward Givens, MSN, NP-C 08/17/2015, 10:59 AM [facility] [address], [place], [place] [address] [phone]

## 2015-08-17 NOTE — Progress Notes (Signed)
I agree with the above plan 

## 2015-08-17 NOTE — Patient Instructions (Addendum)
Continue Topamax and Gabapentin. Consider using a cane during the day. I will check blood work today May consider increasing topamax in the future.

## 2015-08-18 ENCOUNTER — Telehealth: Payer: Self-pay | Admitting: Adult Health

## 2015-08-18 LAB — SEDIMENTATION RATE: SED RATE: 2 mm/h (ref 0–40)

## 2015-08-18 NOTE — Telephone Encounter (Signed)
I called the patient. Her lab work was unremarkable. She states that she did not wake up last night with the numbness and dull pain on the right side of the face and head. She does state that she has a dull headache today. At this time the patient wants to hold off on adjusting any of her medications. She states that if her symptoms get worse she will let me know. She does state that yesterday afternoon she did not feel well. She states that she was having some chest pressure as well as a headache. I advised the patient that if she begins to have severe chest pain or a severe headache she needs to go to the emergency room immediately. Patient verbalized understanding. She states that she has 2 sons who live near her who check on her frequently. Patient advised that she can call us at any time if her symptoms worsen.

## 2015-08-19 DIAGNOSIS — G500 Trigeminal neuralgia: Secondary | ICD-10-CM | POA: Diagnosis not present

## 2015-08-19 DIAGNOSIS — Z7982 Long term (current) use of aspirin: Secondary | ICD-10-CM | POA: Diagnosis not present

## 2015-08-19 DIAGNOSIS — E039 Hypothyroidism, unspecified: Secondary | ICD-10-CM | POA: Diagnosis not present

## 2015-08-19 DIAGNOSIS — R51 Headache: Secondary | ICD-10-CM | POA: Diagnosis not present

## 2015-08-19 DIAGNOSIS — Z79899 Other long term (current) drug therapy: Secondary | ICD-10-CM | POA: Diagnosis not present

## 2015-08-20 NOTE — Telephone Encounter (Signed)
Pt states she went to ED yesterday. She left a msg after 5 yesterday but our phone systems was down.Please call and advise. Patient can be reached at [phone].

## 2015-08-20 NOTE — Telephone Encounter (Signed)
I have spoken with [name] this afternoon--she sts. she went to the ED with shooting facial pain, h/a yesterday and was dx. with trigeminal neuralgia, given rx. for Carbamazepine, which she has not started b/c she is afraid of the side effects. She reports pain is improved.  After speaking with [name], I have offered f/u with her to discuss.  Appt. given on 09-02-15.  Sooner appt. was offered, but pt. prefers to wait.  She will call back if pain returns and she needs sooner appt/fim

## 2015-08-25 ENCOUNTER — Telehealth: Payer: Self-pay

## 2015-08-25 NOTE — Telephone Encounter (Addendum)
Patient called stating she still has HA, not shooting pain but does not feel well. She said HA is worse today than yesterday. She states she keeps a journal and she notes that she had the same symptoms on the left side of head in Nov 2009 thru March 2010. She is inquiring if she should postpone adjustments with the Chiropractor. Please call and advise. She can be reached at [phone].

## 2015-08-25 NOTE — Telephone Encounter (Signed)
Pt schedule for appt on 08-27-15 at 0930am.

## 2015-08-25 NOTE — Telephone Encounter (Signed)
Noted  

## 2015-08-25 NOTE — Telephone Encounter (Signed)
Rn call patient about her headaches. Pt stated she was not having any shooting pain. Her headaches have increase bu no blurred vision. Rn talk to [name](NP) who told Rn that pt has call several times and was given advice on her headache management, and pills adjustment. Pt has a appt on 08-31-15. Pt was asking about should she postpone her chiropractor visit. Rn explain to pt that Jinny Blossom would like to see to discuss any issues she is having with the medication regimen for her headaches. Pt agreed to an earlier appt on August 27 2015 at 09:30pm. Pt stated she did not want to drive,and she will ask her daughter to drive her.

## 2015-08-27 ENCOUNTER — Ambulatory Visit (INDEPENDENT_AMBULATORY_CARE_PROVIDER_SITE_OTHER): Payer: Medicare Other | Admitting: Adult Health

## 2015-08-27 VITALS — Ht 69.0 in | Wt 192.0 lb

## 2015-08-27 DIAGNOSIS — R51 Headache: Secondary | ICD-10-CM

## 2015-08-27 DIAGNOSIS — R519 Headache, unspecified: Secondary | ICD-10-CM

## 2015-08-27 MED ORDER — TOPIRAMATE 50 MG PO TABS: ORAL_TABLET | ORAL | Status: DC

## 2015-08-27 NOTE — Patient Instructions (Signed)
Increase Topamax two tablets in the morning and two at bedtime.  If your symptoms worsen or you develop new symptoms please let us know.

## 2015-08-27 NOTE — Progress Notes (Signed)
PATIENT: [place] DOB: 07-Aug-1930  REASON FOR VISIT: follow up-headaches, sensory neuropathy, seizures HISTORY FROM: patient  HISTORY OF PRESENT ILLNESS: Ms. Johnsen is an 81 year old female with a history of sensory neuropathy, seizures and headache. She returns today complaining of ongoing headache. Last week the patient presented to [facility] emergency room for sharp shooting pains in the head. She had a CT of the head completed that was normal. She was given a prescription for carbamazepine but decided not to take this medication. She states that she continues to have a headache. However now she states her headache is located "all over" she rates her pain a 5 out of 10. Denies any sharp shooting pains. Denies nausea and vomiting. Denies photophobia and phonophobia. Denies any new medical issues. She returns today for an evaluation.  HISTORY  08/17/15: Ms. Koone is an 81 year old female with a history of sensory neuropathy and seizures. She returns today for follow-up. She continues to take Topamax and gabapentin. At the last visit it was recommended that she try Aspercreme for her paresthesias in the feet. She states that this has been beneficial. She still feels that she has a staggering gait. She only uses a cane at bedtime. She does not want to use it during the day. She denies any falls. She does report that in the last 2-3 weeks she's been having "partial numbness" on the right side of the face near the eye in the temporal region. She states that this is accompanied by a dull discomfort. She states that she has been under more stress in the last several weeks due to a death in the family. She is unsure if stress is causing the symptoms. She states that she does have some changes in her vision when she is driving although her description is very vague. She plans to make an appointment with her ophthalmologist. Denies any weakness. Denies a facial droop. She is unsure how long  the "partial numbness" and dull discomfort last. She is able to tell me that she usually wakes up with it in the middle the night. Patient is having a hard time describing her symptoms in detail. She denies any seizure events. She states that she is only having 1 seizure event in her lifetime and at that time she was taken to the hospital and does not recall what happened. She returns today for an evaluation:   REVIEW OF SYSTEMS: Out of a complete 14 system review of symptoms, the patient complains only of the following symptoms, and all other reviewed systems are negative.  Bruise easily, numbness, weakness, walking difficulty, neck stiffness  ALLERGIES: Allergies  Allergen Reactions  . Cephalexin Anaphylaxis and Swelling  . Doxazosin Mesylate Other (See Comments)    Made patient EXTREMELY sick.   . Codeine Other (See Comments)    Hallucinations  . Indomethacin Other (See Comments)    Severe Headache   . Ivp Dye [Iodinated Diagnostic Agents] Hives  . Levofloxacin Other (See Comments)    Unknown  . Morphine Other (See Comments)    Hallucinations   . Sulfamethoxazole-Trimethoprim Other (See Comments)    Unknown   . Penicillins Other (See Comments)    Made patient go into shock    HOME MEDICATIONS: Outpatient Prescriptions Prior to Visit  Medication Sig Dispense Refill  . aspirin EC 81 MG tablet Take 1 tablet (81 mg total) by mouth daily.    . Cholecalciferol (VITAMIN D-3) 1000 UNITS CAPS Take 1 capsule by mouth daily.    Marland Kitchen  DEXILANT 60 MG capsule TAKE 1 CAPSULE DAILY 90 capsule 3  . estradiol (ESTRACE) 0.1 MG/GM vaginal cream Place 4 g vaginally daily. Use twice a week    . fexofenadine (ALLEGRA) 180 MG tablet Take 180 mg by mouth daily.    . fludrocortisone (FLORINEF) 0.1 MG tablet TAKE 1 TABLET THREE TIMES A WEEK 36 tablet 1  . fluticasone (FLONASE) 50 MCG/ACT nasal spray Place 2 sprays into the nose as needed for allergies.     . furosemide (LASIX) 20 MG tablet Take 20 mg by  mouth daily. Pt states she does not take it on Sundays and when she has a doctor's appt.    . gabapentin (NEURONTIN) 100 MG capsule TAKE 2 CAPSULES (200MG ) AT BEDTIME AS TOLERATED 180 capsule 1  . Glucosamine-Chondroitin-MSM 750-400-375 MG TABS Take 1 tablet by mouth daily.    . Hypromellose 0.3 % SOLN Apply to eye. Applies to both eyes daily    . levothyroxine (SYNTHROID, LEVOTHROID) 75 MCG tablet Take 75 mcg by mouth daily before breakfast.    . mirabegron ER (MYRBETRIQ) 50 MG TB24 tablet Take 50 mg by mouth daily.    . montelukast (SINGULAIR) 10 MG tablet Take 10 mg by mouth daily.      . Multiple Vitamins-Minerals ([employer] SPECTRAVITE SENIOR) TABS Take 1 tablet by mouth daily.      . nitrofurantoin (MACRODANTIN) 50 MG capsule Take 50 mg by mouth at bedtime.    . nitroGLYCERIN (NITROSTAT) 0.4 MG SL tablet Place 0.4 mg under the tongue every 5 (five) minutes as needed for chest pain.    . Omega-3 Fatty Acids (FISH OIL PO) Take by mouth daily.    . Probiotic Product (ALIGN PO) Take 1 tablet by mouth daily.     . vitamin C (ASCORBIC ACID) 500 MG tablet Take 500 mg by mouth daily.     . Wheat Dextrin (BENEFIBER DRINK MIX) PACK Take by mouth 2 (two) times daily.    Marland Kitchen topiramate (TOPAMAX) 50 MG tablet Takes 2 tablets in the morning and 1 tablet in the evening. 270 tablet 3  . bisacodyl (BISACODYL) 5 MG EC tablet Take 5 mg by mouth daily as needed for constipation.     No facility-administered medications prior to visit.    PAST MEDICAL HISTORY: Past Medical History  Diagnosis Date  . Syncope     Neurocardiogenic - positive tilt table test, on Florinef  . Allergy to radiographic dye   . Diverticulosis of colon (without mention of hemorrhage)     TCS by Dr. Gala Romney  . Irritable bowel syndrome   . GERD (gastroesophageal reflux disease)   . Peripheral polyneuropathy     Confirmed by EMG and nerve conduction velocities   . Migraine   . Pulmonary nodule   . Mitral regurgitation   . Hemorrhoids,  internal     TCS by Dr. Gala Romney  . Chronic diastolic heart failure   . Tubular adenoma     PAST SURGICAL HISTORY: Past Surgical History  Procedure Laterality Date  . Wrist surgery    . Foot surgery    . Total abdominal hysterectomy    . Appendectomy    . Back surgery    . Back pain      going to chiropracator for back and hip pain  . Colonoscopy  06/16/2005    internal hemorrhoids, L side diverticula - Dr. Gala Romney  . Esophagogastroduodenoscopy  12/21/2006      Dr. Vivi Ferns esophagus/Patulous EG junction, small to  moderate sized hiatal hernia,   multiple fundal gland type appearing polyps biopsied, otherwise normal  . Colonoscopy N/A 05/02/2013    Dr. Gala Romney- normal rectum, scattered L sided diverticula. bx= tubular adenoma    FAMILY HISTORY: Family History  Problem Relation Age of Onset  . Colon cancer Neg Hx   . Stroke Father     SOCIAL HISTORY: Social History   Social History  . Marital Status: Widowed    Spouse Name: N/A  . Number of Children: 4  . Years of Education: college   Occupational History  .      Retired   Social History Main Topics  . Smoking status: Never Smoker   . Smokeless tobacco: Never Used     Comment: tobacco use - no  . Alcohol Use: No  . Drug Use: No  . Sexual Activity: Not on file   Other Topics Concern  . Not on file   Social History Narrative   Retired, widowed, education college education .   Right handed.   Caffeine one cup of coffee daily.       PHYSICAL EXAM  Filed Vitals:   08/27/15 0940  Height: 5\' 9"  (1.753 m)  Weight: 192 lb (87.091 kg)   Body mass index is 28.34 kg/(m^2).  Generalized: Well developed, in no acute distress   Neurological examination  Mentation: Alert oriented to time, place, history taking. Follows all commands speech and language fluent Cranial nerve II-XII: Pupils were equal round reactive to light. Extraocular movements were full, visual field were full on confrontational test. Facial  sensation and strength were normal. Uvula tongue midline. Head turning and shoulder shrug  were normal and symmetric. Motor: The motor testing reveals 5 over 5 strength of all 4 extremities. Good symmetric motor tone is noted throughout.  Sensory: Sensory testing is intact to soft touch on all 4 extremities. No evidence of extinction is noted.  Coordination: Cerebellar testing reveals good finger-nose-finger and heel-to-shin bilaterally.   Reflexes: Deep tendon reflexes are symmetric and normal bilaterally.   DIAGNOSTIC DATA (LABS, IMAGING, TESTING) - I reviewed patient records, labs, notes, testing and imaging myself where available.   ASSESSMENT AND PLAN 81 y.o. year old female  has a past medical history of Syncope; Allergy to radiographic dye; Diverticulosis of colon (without mention of hemorrhage); Irritable bowel syndrome; GERD (gastroesophageal reflux disease); Peripheral polyneuropathy; Migraine; Pulmonary nodule; Mitral regurgitation; Hemorrhoids, internal; Chronic diastolic heart failure; and Tubular adenoma. here with:  1. Headache  The patient has been having ongoing headaches. Last week the patient presented to the emergency room with sharp shooting pains. They completed a CT of the head that was normal. She was given a prescription of carbamazepine but did not start this. At this time we will increase Topamax to 50 mg 2 tablets twice daily. Patient advised that if her headache severity and frequency does not improve she she'll let us know. She will follow-up in 3 months with Dr. Leonie Man or sooner if needed.     Ward Givens, MSN, NP-C 08/27/2015, 8:33 PM [facility] [address] [place], [place] [address] [phone]

## 2015-08-28 NOTE — Progress Notes (Signed)
I have read the note, and I agree with the clinical assessment and plan.  Kitty Cadavid KEITH   

## 2015-09-02 ENCOUNTER — Ambulatory Visit: Payer: Self-pay | Admitting: Adult Health

## 2015-09-07 ENCOUNTER — Other Ambulatory Visit: Payer: Self-pay | Admitting: Cardiology

## 2015-09-07 MED ORDER — FUROSEMIDE 20 MG PO TABS: 20.0000 mg | ORAL_TABLET | Freq: Every day | ORAL | Status: DC

## 2015-09-07 NOTE — Telephone Encounter (Signed)
Done

## 2015-09-07 NOTE — Telephone Encounter (Signed)
Patient is requesting a refill on her furosemide (LASIX) 20 MG tablet [facility].

## 2015-09-10 ENCOUNTER — Ambulatory Visit: Payer: Medicare Other | Admitting: Cardiology

## 2015-09-14 DIAGNOSIS — B351 Tinea unguium: Secondary | ICD-10-CM | POA: Diagnosis not present

## 2015-09-14 DIAGNOSIS — L84 Corns and callosities: Secondary | ICD-10-CM | POA: Diagnosis not present

## 2015-09-14 DIAGNOSIS — I70203 Unspecified atherosclerosis of native arteries of extremities, bilateral legs: Secondary | ICD-10-CM | POA: Diagnosis not present

## 2015-09-15 ENCOUNTER — Ambulatory Visit (INDEPENDENT_AMBULATORY_CARE_PROVIDER_SITE_OTHER): Payer: Medicare Other | Admitting: Internal Medicine

## 2015-09-15 ENCOUNTER — Encounter: Payer: Self-pay | Admitting: Internal Medicine

## 2015-09-15 ENCOUNTER — Ambulatory Visit: Payer: Medicare Other | Admitting: Internal Medicine

## 2015-09-15 VITALS — BP 124/81 | HR 66 | Temp 97.1°F | Ht 69.0 in | Wt 191.2 lb

## 2015-09-15 DIAGNOSIS — K219 Gastro-esophageal reflux disease without esophagitis: Secondary | ICD-10-CM | POA: Diagnosis not present

## 2015-09-15 DIAGNOSIS — K5900 Constipation, unspecified: Secondary | ICD-10-CM

## 2015-09-15 NOTE — Patient Instructions (Signed)
Continue Dexilant daily  Continue Benefiber daily  Add 17 grams of Miralax daily for constipation  Keep appointment with Dr. Quintin Alto in 6 months  Office visit in 6 months

## 2015-09-15 NOTE — Progress Notes (Signed)
Primary Care Physician:  Manon Hilding, MD Primary Gastroenterologist:  Dr. Gala Romney   HPI:  [name] is a 81 y.o. female here for followup of GERD. Symptoms well controlled on Dexilant 60 mg daily. No dysphagia. . Still has some difficulty with constipation in spite of adding Benefiber to her regimen.  Significant issues with new headache receently. She's been to the neurologist and the ED at [facility]. Planning see Dr. Quintin Alto in December.  She has labs from the ED at [facility]. Sedimentation rate 2 white count, hemoglobin okay.    Past Medical History  Diagnosis Date  . Syncope     Neurocardiogenic - positive tilt table test, on Florinef  . Allergy to radiographic dye   . Diverticulosis of colon (without mention of hemorrhage)     TCS by Dr. Gala Romney  . Irritable bowel syndrome   . GERD (gastroesophageal reflux disease)   . Peripheral polyneuropathy     Confirmed by EMG and nerve conduction velocities   . Migraine   . Pulmonary nodule   . Mitral regurgitation   . Hemorrhoids, internal     TCS by Dr. Gala Romney  . Chronic diastolic heart failure   . Tubular adenoma     Past Surgical History  Procedure Laterality Date  . Wrist surgery    . Foot surgery    . Total abdominal hysterectomy    . Appendectomy    . Back surgery    . Back pain      going to chiropracator for back and hip pain  . Colonoscopy  06/16/2005    internal hemorrhoids, L side diverticula - Dr. Gala Romney  . Esophagogastroduodenoscopy  12/21/2006      Dr. Vivi Ferns esophagus/Patulous EG junction, small to moderate sized hiatal hernia,   multiple fundal gland type appearing polyps biopsied, otherwise normal  . Colonoscopy N/A 05/02/2013    Dr. Gala Romney- normal rectum, scattered L sided diverticula. bx= tubular adenoma    Prior to Admission medications   Medication Sig Start Date End Date Taking? Authorizing Provider  aspirin EC 81 MG tablet Take 1 tablet (81 mg total) by mouth daily. 09/12/12  Yes Donney Dice, PA-C  Cholecalciferol (VITAMIN D-3) 1000 UNITS CAPS Take 1 capsule by mouth daily.   Yes Historical Provider, MD  DEXILANT 60 MG capsule TAKE 1 CAPSULE DAILY 05/14/15  Yes Mahala Menghini, PA-C  estradiol (ESTRACE) 0.1 MG/GM vaginal cream Place 4 g vaginally daily. Use twice a week   Yes Historical Provider, MD  fexofenadine (ALLEGRA) 180 MG tablet Take 180 mg by mouth daily.   Yes Historical Provider, MD  fludrocortisone (FLORINEF) 0.1 MG tablet TAKE 1 TABLET THREE TIMES A WEEK 08/07/15  Yes Satira Sark, MD  fluticasone [facility]) 50 MCG/ACT nasal spray Place 2 sprays into the nose as needed for allergies.    Yes Historical Provider, MD  furosemide (LASIX) 20 MG tablet Take 1 tablet (20 mg total) by mouth daily. Pt states she does not take it on Sundays and when she has a doctor's appt. 09/07/15  Yes Satira Sark, MD  gabapentin (NEURONTIN) 100 MG capsule TAKE 2 CAPSULES (200MG ) AT BEDTIME AS TOLERATED 04/13/15  Yes Garvin Fila, MD  Glucosamine-Chondroitin-MSM [phone] MG TABS Take 1 tablet by mouth daily.   Yes Historical Provider, MD  Hypromellose 0.3 % SOLN Apply to eye. Applies to both eyes daily   Yes Historical Provider, MD  levothyroxine (SYNTHROID, LEVOTHROID) 75 MCG tablet Take 75 mcg  by mouth daily before breakfast.   Yes Historical Provider, MD  mirabegron ER (MYRBETRIQ) 50 MG TB24 tablet Take 50 mg by mouth daily.   Yes Historical Provider, MD  montelukast (SINGULAIR) 10 MG tablet Take 10 mg by mouth daily.     Yes Historical Provider, MD  Multiple Vitamins-Minerals ([employer]S SPECTRAVITE SENIOR) TABS Take 1 tablet by mouth daily.     Yes Historical Provider, MD  nitrofurantoin (MACRODANTIN) 50 MG capsule Take 50 mg by mouth at bedtime.   Yes Historical Provider, MD  nitroGLYCERIN (NITROSTAT) 0.4 MG SL tablet Place 0.4 mg under the tongue every 5 (five) minutes as needed for chest pain. 09/12/12  Yes Donney Dice, PA-C  Omega-3 Fatty Acids (FISH OIL PO) Take by mouth daily.    Yes Historical Provider, MD  Probiotic Product (ALIGN PO) Take 1 tablet by mouth daily.    Yes Historical Provider, MD  topiramate (TOPAMAX) 50 MG tablet Takes 2 tablets in the morning and 2 tablet in the evening. 08/27/15  Yes Ward Givens, NP  vitamin C (ASCORBIC ACID) 500 MG tablet Take 500 mg by mouth daily.    Yes Historical Provider, MD  Wheat Dextrin (BENEFIBER DRINK MIX) PACK Take by mouth 2 (two) times daily.   Yes Historical Provider, MD    Allergies as of 09/15/2015 - Review Complete 09/15/2015  Allergen Reaction Noted  . Cephalexin Anaphylaxis and Swelling 02/25/2009  . Doxazosin mesylate Other (See Comments) 04/24/2013  . Codeine Other (See Comments) 02/25/2009  . Indomethacin Other (See Comments) 02/25/2009  . Ivp dye [iodinated diagnostic agents] Hives 04/24/2013  . Levofloxacin Other (See Comments) 02/25/2009  . Morphine Other (See Comments) 02/25/2009  . Sulfamethoxazole-trimethoprim Other (See Comments)   . Penicillins Other (See Comments) 02/25/2009    Family History  Problem Relation Age of Onset  . Colon cancer Neg Hx   . Stroke Father     Social History   Social History  . Marital Status: Widowed    Spouse Name: N/A  . Number of Children: 4  . Years of Education: college   Occupational History  .      Retired   Social History Main Topics  . Smoking status: Never Smoker   . Smokeless tobacco: Never Used     Comment: tobacco use - no  . Alcohol Use: No  . Drug Use: No  . Sexual Activity: Not on file   Other Topics Concern  . Not on file   Social History Narrative   Retired, widowed, education college education .   Right handed.   Caffeine one cup of coffee daily.     Review of Systems: See HPI, otherwise negative ROS  Physical Exam: BP 124/81 mmHg  Pulse 66  Temp(Src) 97.1 F (36.2 C)  Ht 5\' 9"  (1.753 m)  Wt 191 lb 3.2 oz (86.728 kg)  BMI 28.22 kg/m2 General:   Alert,  Well-developed, well-nourished, pleasant and cooperative in  NAD Skin:  Intact without significant lesions or rashes. Neck:  Supple; no masses or thyromegaly. No significant cervical adenopathy. Lungs:  Clear throughout to auscultation.   No wheezes, crackles, or rhonchi. No acute distress. Heart:  Regular rate and rhythm; no murmurs, clicks, rubs,  or gallops. Abdomen: Non-distended, normal bowel sounds.  Soft and nontender without appreciable mass or hepatosplenomegaly.  Pulses:  Normal pulses noted. Extremities:  Without clubbing or edema.  Impression:   81 year old lady with long-standing GERD and chronic cough. These symptoms have settled down with Dexilant  60 mg daily although I continue to feel that cough may have not been particularly related to reflux. Constipation still suboptimally managed with fiber supplement. Headache issues have been her major health concern recently.   Recommendations:  Continue Dexilant daily  Continue Benefiber daily  Add 17 grams of Miralax daily for constipation  Keep appointment with Dr. Quintin Alto in 6 months  Office visit in 6 months    Notice: This dictation was prepared with Dragon dictation along with smaller phrase technology. Any transcriptional errors that result from this process are unintentional and may not be corrected upon review.

## 2015-09-18 ENCOUNTER — Ambulatory Visit (INDEPENDENT_AMBULATORY_CARE_PROVIDER_SITE_OTHER): Payer: Medicare Other | Admitting: Cardiology

## 2015-09-18 ENCOUNTER — Encounter: Payer: Self-pay | Admitting: Cardiology

## 2015-09-18 VITALS — BP 123/73 | HR 57 | Ht 68.0 in | Wt 189.1 lb

## 2015-09-18 DIAGNOSIS — R51 Headache: Secondary | ICD-10-CM | POA: Diagnosis not present

## 2015-09-18 DIAGNOSIS — R42 Dizziness and giddiness: Secondary | ICD-10-CM | POA: Diagnosis not present

## 2015-09-18 DIAGNOSIS — R55 Syncope and collapse: Secondary | ICD-10-CM

## 2015-09-18 DIAGNOSIS — I5032 Chronic diastolic (congestive) heart failure: Secondary | ICD-10-CM | POA: Diagnosis not present

## 2015-09-18 DIAGNOSIS — Z23 Encounter for immunization: Secondary | ICD-10-CM | POA: Diagnosis not present

## 2015-09-18 NOTE — Progress Notes (Signed)
Cardiology Office Note  Date: 09/18/2015   ID: [name], DOB [birth year], MRN [id number]  PCP: Manon Hilding, MD  Primary Cardiologist: Rozann Lesches, MD   Chief Complaint  Patient presents with  . Diastolic heart failure    History of Present Illness: [name] is an 81 y.o. female last seen in March. She presents for a routine follow-up visit. I reviewed her chart, she has had recent follow-up with neurology and gastroenterology. Mainly complaining of recurrent headaches recently. From a cardiac perspective, she has not had any progressing shortness of breath, her weight has been stable. She has mild chronic ankle edema as before.  We reviewed her medications which are outlined below.  She showed me a recent health screening assessment, generally showing low risk evaluations. I also reviewed her recent lab work.   Past Medical History  Diagnosis Date  . Syncope     Neurocardiogenic - positive tilt table test, on Florinef  . Allergy to radiographic dye   . Diverticulosis of colon (without mention of hemorrhage)     TCS by Dr. Gala Romney  . Irritable bowel syndrome   . GERD (gastroesophageal reflux disease)   . Peripheral polyneuropathy     Confirmed by EMG and nerve conduction velocities   . Migraine   . Pulmonary nodule   . Mitral regurgitation   . Hemorrhoids, internal     TCS by Dr. Gala Romney  . Chronic diastolic heart failure   . Tubular adenoma     Current Outpatient Prescriptions  Medication Sig Dispense Refill  . aspirin EC 81 MG tablet Take 1 tablet (81 mg total) by mouth daily.    . Cholecalciferol (VITAMIN D-3) 1000 UNITS CAPS Take 1 capsule by mouth daily.    Marland Kitchen DEXILANT 60 MG capsule TAKE 1 CAPSULE DAILY 90 capsule 3  . estradiol (ESTRACE) 0.1 MG/GM vaginal cream Place 4 g vaginally daily. Use twice a week    . fexofenadine (ALLEGRA) 180 MG tablet Take 180 mg by mouth daily.    . fludrocortisone (FLORINEF) 0.1 MG tablet TAKE 1 TABLET THREE  TIMES A WEEK 36 tablet 1  . fluticasone (FLONASE) 50 MCG/ACT nasal spray Place 2 sprays into the nose as needed for allergies.     . furosemide (LASIX) 20 MG tablet Take 1 tablet (20 mg total) by mouth daily. Pt states she does not take it on Sundays and when she has a doctor's appt. 90 tablet 3  . gabapentin (NEURONTIN) 100 MG capsule TAKE 2 CAPSULES (200MG ) AT BEDTIME AS TOLERATED 180 capsule 1  . Glucosamine-Chondroitin-MSM 750-400-375 MG TABS Take 1 tablet by mouth daily.    . Hypromellose 0.3 % SOLN Apply to eye. Applies to both eyes daily    . levothyroxine (SYNTHROID, LEVOTHROID) 75 MCG tablet Take 75 mcg by mouth daily before breakfast.    . mirabegron ER (MYRBETRIQ) 50 MG TB24 tablet Take 50 mg by mouth daily.    . montelukast (SINGULAIR) 10 MG tablet Take 10 mg by mouth daily.      . Multiple Vitamins-Minerals ([employer]S SPECTRAVITE SENIOR) TABS Take 1 tablet by mouth daily.      . nitrofurantoin (MACRODANTIN) 50 MG capsule Take 50 mg by mouth at bedtime.    . nitroGLYCERIN (NITROSTAT) 0.4 MG SL tablet Place 0.4 mg under the tongue every 5 (five) minutes as needed for chest pain.    . Omega-3 Fatty Acids (FISH OIL PO) Take by mouth daily.    . polyethylene glycol (  MIRALAX / GLYCOLAX) packet Take 17 g by mouth daily.    . Probiotic Product (ALIGN PO) Take 1 tablet by mouth daily.     Marland Kitchen topiramate (TOPAMAX) 50 MG tablet Takes 2 tablets in the morning and 2 tablet in the evening. 270 tablet 3  . vitamin C (ASCORBIC ACID) 500 MG tablet Take 500 mg by mouth daily.     . Wheat Dextrin (BENEFIBER DRINK MIX) PACK Take by mouth 2 (two) times daily.     No current facility-administered medications for this visit.    Allergies:  Cephalexin; Doxazosin mesylate; Codeine; Indomethacin; Ivp dye; Levofloxacin; Morphine; Sulfamethoxazole-trimethoprim; and Penicillins   Social History: The patient  reports that she has never smoked. She has never used smokeless tobacco. She reports that she does not drink  alcohol or use illicit drugs.   ROS:  Please see the history of present illness. Otherwise, complete review of systems is positive for recurrent headaches, chronic back pain.  All other systems are reviewed and negative.   Physical Exam: VS:  BP 123/73 mmHg  Pulse 57  Ht 5\' 8"  (1.727 m)  Wt 189 lb 1.9 oz (85.784 kg)  BMI 28.76 kg/m2  SpO2 100%, BMI Body mass index is 28.76 kg/(m^2).  Wt Readings from Last 3 Encounters:  09/18/15 189 lb 1.9 oz (85.784 kg)  09/15/15 191 lb 3.2 oz (86.728 kg)  08/27/15 192 lb (87.091 kg)    Tall statured woman, appears comfortable at rest.  HEENT: Conjunctiva and lids normal, oropharynx clear.  Neck: Supple, no elevated JVP or carotid bruits, prominent right sided carotid pulsation likely tortuous section of the vessel, no thyromegaly.  Lungs: Clear to auscultation, nonlabored breathing at rest.  Cardiac: Regular rate and rhythm, soft apical systolic murmur, no S3.  Abdomen: Soft, nontender,bowel sounds present.  Extremities: Trace ankle edema, prominent spider veins and ankle region left greater than right, distal pulses 2+.   ECG: Tracing from 03/02/2015 showed normal sinus rhythm.   Recent Labwork:  June 2016: cholesterol 160, TG 77, HDL 50, LDL 95 August 2016: Hgb 13.2, platelets 132, BUN 12, creatinine 0.6, potassium 4.4  Other Studies Reviewed Today:  Echocardiogram 03/12/2014: Study Conclusions  - Left ventricle: The cavity size was normal. Wall thickness was normal. Systolic function was normal. The estimated ejection fraction was in the range of 60% to 65%. Wall motion was normal; there were no regional wall motion abnormalities. Features are consistent with a pseudonormal left ventricular filling pattern, with concomitant abnormal relaxation and increased filling pressure (grade 2 diastolic dysfunction). - Aortic valve: Trileaflet; mildly calcified leaflets. Trivial regurgitation. - Mitral valve: Mild to  moderate regurgitation. - Left atrium: The atrium was at the upper limits of normal in size. Volume index: 31.11ml/m^2 (S). - Right ventricle: The cavity size was mildly dilated. Systolic function was normal. - Right atrium: Central venous pressure: 84mm Hg (est). - Tricuspid valve: Moderate regurgitation. - Pulmonary arteries: PA peak pressure: 1mm Hg (S). - Pericardium, extracardiac: There was no pericardial effusion. Impressions:  - Normal LV wall thickness with LVEF 99-24%, grade 2 diastolic dysfunction. Upper normal left atrial chamber size. Mild to moderate mitral regurgitation. MIld RV dilatation with normal contraction. Moderate tricuspid regurgitation with PASP 49 mmHg.  Assessment and Plan:  1. History of diastolic heart failure, clinically stable. No significant change in weight. Echocardiogram from last year is outlined above. He continues on low-dose Lasix.  2. History of neurocardiogenic syncope, no recurrences.  Current medicines were reviewed with the patient  today.  Disposition: FU with me in 1 year.   Signed, Satira Sark, MD, [facility] 09/18/2015 10:22 AM    [facility] [place], [place], [place] [address] Phone: [phone]; Fax: [phone]

## 2015-09-18 NOTE — Patient Instructions (Signed)

## 2015-10-01 DIAGNOSIS — H2513 Age-related nuclear cataract, bilateral: Secondary | ICD-10-CM | POA: Diagnosis not present

## 2015-10-13 DIAGNOSIS — G500 Trigeminal neuralgia: Secondary | ICD-10-CM | POA: Diagnosis not present

## 2015-10-15 DIAGNOSIS — H2512 Age-related nuclear cataract, left eye: Secondary | ICD-10-CM | POA: Diagnosis not present

## 2015-10-15 DIAGNOSIS — H40011 Open angle with borderline findings, low risk, right eye: Secondary | ICD-10-CM | POA: Diagnosis not present

## 2015-10-15 DIAGNOSIS — H25011 Cortical age-related cataract, right eye: Secondary | ICD-10-CM | POA: Diagnosis not present

## 2015-10-15 DIAGNOSIS — H25012 Cortical age-related cataract, left eye: Secondary | ICD-10-CM | POA: Diagnosis not present

## 2015-10-15 DIAGNOSIS — H2511 Age-related nuclear cataract, right eye: Secondary | ICD-10-CM | POA: Diagnosis not present

## 2015-10-15 DIAGNOSIS — H40012 Open angle with borderline findings, low risk, left eye: Secondary | ICD-10-CM | POA: Diagnosis not present

## 2015-10-16 ENCOUNTER — Other Ambulatory Visit: Payer: Self-pay | Admitting: Neurology

## 2015-10-27 ENCOUNTER — Other Ambulatory Visit: Payer: Self-pay

## 2015-10-27 MED ORDER — TOPIRAMATE 50 MG PO TABS: ORAL_TABLET | ORAL | Status: DC

## 2015-11-03 DIAGNOSIS — H538 Other visual disturbances: Secondary | ICD-10-CM | POA: Diagnosis not present

## 2015-11-03 DIAGNOSIS — H2512 Age-related nuclear cataract, left eye: Secondary | ICD-10-CM | POA: Diagnosis not present

## 2015-11-16 DIAGNOSIS — Z1231 Encounter for screening mammogram for malignant neoplasm of breast: Secondary | ICD-10-CM | POA: Diagnosis not present

## 2015-11-16 DIAGNOSIS — H25011 Cortical age-related cataract, right eye: Secondary | ICD-10-CM | POA: Diagnosis not present

## 2015-11-16 DIAGNOSIS — H2511 Age-related nuclear cataract, right eye: Secondary | ICD-10-CM | POA: Diagnosis not present

## 2015-11-17 DIAGNOSIS — N3281 Overactive bladder: Secondary | ICD-10-CM | POA: Diagnosis not present

## 2015-11-17 DIAGNOSIS — N952 Postmenopausal atrophic vaginitis: Secondary | ICD-10-CM | POA: Diagnosis not present

## 2015-11-17 DIAGNOSIS — N3941 Urge incontinence: Secondary | ICD-10-CM | POA: Diagnosis not present

## 2015-11-17 DIAGNOSIS — R350 Frequency of micturition: Secondary | ICD-10-CM | POA: Diagnosis not present

## 2015-11-17 DIAGNOSIS — M25471 Effusion, right ankle: Secondary | ICD-10-CM | POA: Diagnosis not present

## 2015-11-17 DIAGNOSIS — R3914 Feeling of incomplete bladder emptying: Secondary | ICD-10-CM | POA: Diagnosis not present

## 2015-11-17 DIAGNOSIS — M79671 Pain in right foot: Secondary | ICD-10-CM | POA: Diagnosis not present

## 2015-11-17 DIAGNOSIS — R3915 Urgency of urination: Secondary | ICD-10-CM | POA: Diagnosis not present

## 2015-11-17 DIAGNOSIS — R8299 Other abnormal findings in urine: Secondary | ICD-10-CM | POA: Diagnosis not present

## 2015-11-19 DIAGNOSIS — M7989 Other specified soft tissue disorders: Secondary | ICD-10-CM | POA: Diagnosis not present

## 2015-11-19 DIAGNOSIS — R238 Other skin changes: Secondary | ICD-10-CM | POA: Diagnosis not present

## 2015-11-19 DIAGNOSIS — M79671 Pain in right foot: Secondary | ICD-10-CM | POA: Diagnosis not present

## 2015-11-19 DIAGNOSIS — M79672 Pain in left foot: Secondary | ICD-10-CM | POA: Diagnosis not present

## 2015-11-23 DIAGNOSIS — I70203 Unspecified atherosclerosis of native arteries of extremities, bilateral legs: Secondary | ICD-10-CM | POA: Diagnosis not present

## 2015-11-23 DIAGNOSIS — G40909 Epilepsy, unspecified, not intractable, without status epilepticus: Secondary | ICD-10-CM | POA: Diagnosis not present

## 2015-11-23 DIAGNOSIS — L84 Corns and callosities: Secondary | ICD-10-CM | POA: Diagnosis not present

## 2015-11-23 DIAGNOSIS — E669 Obesity, unspecified: Secondary | ICD-10-CM | POA: Diagnosis not present

## 2015-11-23 DIAGNOSIS — E039 Hypothyroidism, unspecified: Secondary | ICD-10-CM | POA: Diagnosis not present

## 2015-11-23 DIAGNOSIS — K210 Gastro-esophageal reflux disease with esophagitis: Secondary | ICD-10-CM | POA: Diagnosis not present

## 2015-11-23 DIAGNOSIS — B351 Tinea unguium: Secondary | ICD-10-CM | POA: Diagnosis not present

## 2015-11-24 DIAGNOSIS — H2511 Age-related nuclear cataract, right eye: Secondary | ICD-10-CM | POA: Diagnosis not present

## 2015-11-26 ENCOUNTER — Ambulatory Visit (INDEPENDENT_AMBULATORY_CARE_PROVIDER_SITE_OTHER): Payer: Medicare Other | Admitting: Neurology

## 2015-11-26 ENCOUNTER — Encounter: Payer: Self-pay | Admitting: Neurology

## 2015-11-26 VITALS — BP 110/73 | HR 61 | Ht 68.0 in | Wt 188.4 lb

## 2015-11-26 DIAGNOSIS — M792 Neuralgia and neuritis, unspecified: Secondary | ICD-10-CM

## 2015-11-26 MED ORDER — TOPIRAMATE 50 MG PO TABS: ORAL_TABLET | ORAL | Status: DC

## 2015-11-26 NOTE — Progress Notes (Signed)
PATIENT: [name] DOB: 07-Sep-1930  REASON FOR VISIT: follow up-headaches, sensory neuropathy, seizures HISTORY FROM: patient  HISTORY OF PRESENT ILLNESS: Ms. [name] is an 81 year old female with a history of sensory neuropathy, seizures and headache. She returns today complaining of ongoing headache. Last week the patient presented to [name] for sharp shooting pains in the head. She had a CT of the head completed that was normal. She was given a prescription for carbamazepine but decided not to take this medication. She states that she continues to have a headache. However now she states her headache is located "all over" she rates her pain a 5 out of 10. Denies any sharp shooting pains. Denies nausea and vomiting. Denies photophobia and phonophobia. Denies any new medical issues. She returns today for an evaluation.  HISTORY  08/17/15: [name] is an 81 year old female with a history of sensory neuropathy and seizures. She returns today for follow-up. She continues to take Topamax and gabapentin. At the last visit it was recommended that she try Aspercreme for her paresthesias in the feet. She states that this has been beneficial. She still feels that she has a staggering gait. She only uses a cane at bedtime. She does not want to use it during the day. She denies any falls. She does report that in the last 2-3 weeks she's been having "partial numbness" on the right side of the face near the eye in the temporal region. She states that this is accompanied by a dull discomfort. She states that she has been under more stress in the last several weeks due to a death in the family. She is unsure if stress is causing the symptoms. She states that she does have some changes in her vision when she is driving although her description is very vague. She plans to make an appointment with her ophthalmologist. Denies any weakness. Denies a facial droop. She is unsure how long  the "partial numbness" and dull discomfort last. She is able to tell me that she usually wakes up with it in the middle the night. Patient is having a hard time describing her symptoms in detail. She denies any seizure events. She states that she is only having 1 seizure event in her lifetime and at that time she was taken to the hospital and does not recall what happened. She returns today for an evaluation:  Update 11/26/2015 : She returns for follow-up to complete by daughter after last visit 3 months ago. She continues to have headaches off and on and Topamax seems to help. The headache frequency is quite variable. She seems to tolerating Topamax without significant side effects. She continues to have paresthesias in her feet as well as she is more recently bothered by swelling in the right foot and pain. She saw her primary physician who ordered x-rays which did not reveal any fracture. She also had lower extremity venous Dopplers which were negative for DVT. She saw a podiatrist who thought she had inflamed tendons. She's also had pain in the hip for which she plans to see her orthopedic doctor tomorrow. She takes gabapentin 200 mg at night and wonders if she needs it. She's not had a seizure for now 4-5 years. REVIEW OF SYSTEMS: Out of a complete 14 system review of symptoms, the patient complains only of the following symptoms, and all other reviewed systems are negative.  Fatigue, drooling, leg swelling, bladder urgency, frequency of urination, joint and back pain, hip pain,  aching muscles, weakness, headache  ALLERGIES: Allergies  Allergen Reactions  . Cephalexin Anaphylaxis and Swelling  . Doxazosin Mesylate Other (See Comments)    Made patient EXTREMELY sick.   . Codeine Other (See Comments)    Hallucinations  . Indomethacin Other (See Comments)    Severe Headache   . Ivp Dye [Iodinated Diagnostic Agents] Hives  . Levofloxacin Other (See Comments)    Unknown  . Morphine Other (See  Comments)    Hallucinations   . Sulfamethoxazole-Trimethoprim Other (See Comments)    Unknown   . Penicillins Other (See Comments)    Made patient go into shock    HOME MEDICATIONS: Outpatient Prescriptions Prior to Visit  Medication Sig Dispense Refill  . aspirin EC 81 MG tablet Take 1 tablet (81 mg total) by mouth daily.    . Cholecalciferol (VITAMIN D-3) 1000 UNITS CAPS Take 1 capsule by mouth daily.    Marland Kitchen DEXILANT 60 MG capsule TAKE 1 CAPSULE DAILY 90 capsule 3  . estradiol (ESTRACE) 0.1 MG/GM vaginal cream Place 4 g vaginally daily. Use twice a week    . fexofenadine (ALLEGRA) 180 MG tablet Take 180 mg by mouth daily.    . fludrocortisone (FLORINEF) 0.1 MG tablet TAKE 1 TABLET THREE TIMES A WEEK 36 tablet 1  . fluticasone (FLONASE) 50 MCG/ACT nasal spray Place 2 sprays into the nose as needed for allergies.     . furosemide (LASIX) 20 MG tablet Take 1 tablet (20 mg total) by mouth daily. Pt states she does not take it on Sundays and when she has a doctor's appt. 90 tablet 3  . Glucosamine-Chondroitin-MSM 750-400-375 MG TABS Take 1 tablet by mouth daily.    . Hypromellose 0.3 % SOLN Apply to eye. Applies to both eyes daily    . levothyroxine (SYNTHROID, LEVOTHROID) 75 MCG tablet Take 75 mcg by mouth daily before breakfast.    . mirabegron ER (MYRBETRIQ) 50 MG TB24 tablet Take 50 mg by mouth daily.    . Multiple Vitamins-Minerals (CVS SPECTRAVITE SENIOR) TABS Take 1 tablet by mouth daily.      . nitrofurantoin (MACRODANTIN) 50 MG capsule Take 50 mg by mouth at bedtime.    . nitroGLYCERIN (NITROSTAT) 0.4 MG SL tablet Place 0.4 mg under the tongue every 5 (five) minutes as needed for chest pain.    . Omega-3 Fatty Acids (FISH OIL PO) Take by mouth daily.    . polyethylene glycol (MIRALAX / GLYCOLAX) packet Take 17 g by mouth daily.    . Probiotic Product (ALIGN PO) Take 1 tablet by mouth daily.     . vitamin C (ASCORBIC ACID) 500 MG tablet Take 500 mg by mouth daily.     . Wheat Dextrin  (BENEFIBER DRINK MIX) PACK Take by mouth 2 (two) times daily.    Marland Kitchen gabapentin (NEURONTIN) 100 MG capsule TAKE 2 CAPSULES AT BEDTIME AS TOLERATED 180 capsule 1  . topiramate (TOPAMAX) 50 MG tablet Takes 2 tablets in the morning and 2 tablet in the evening. 360 tablet 3  . montelukast (SINGULAIR) 10 MG tablet Take 10 mg by mouth daily.       No facility-administered medications prior to visit.    PAST MEDICAL HISTORY: Past Medical History  Diagnosis Date  . Syncope     Neurocardiogenic - positive tilt table test, on Florinef  . Allergy to radiographic dye   . Diverticulosis of colon (without mention of hemorrhage)     TCS by Dr. Gala Romney  . Irritable  bowel syndrome   . GERD (gastroesophageal reflux disease)   . Peripheral polyneuropathy (H[facility])     Confirmed by EMG and nerve conduction velocities   . Migraine   . Pulmonary nodule   . Mitral regurgitation   . Hemorrhoids, internal     TCS by Dr. Gala Romney  . Chronic diastolic heart failure ([place])   . Tubular adenoma     PAST SURGICAL HISTORY: Past Surgical History  Procedure Laterality Date  . Wrist surgery    . Foot surgery    . Total abdominal hysterectomy    . Appendectomy    . Back surgery    . Back pain      going to chiropracator for back and hip pain  . Colonoscopy  06/16/2005    internal hemorrhoids, L side diverticula - Dr. Gala Romney  . Esophagogastroduodenoscopy  12/21/2006      Dr. Vivi Ferns esophagus/Patulous EG junction, small to moderate sized hiatal hernia,   multiple fundal gland type appearing polyps biopsied, otherwise normal  . Colonoscopy N/A 05/02/2013    Dr. Gala Romney- normal rectum, scattered L sided diverticula. bx= tubular adenoma    FAMILY HISTORY: Family History  Problem Relation Age of Onset  . Colon cancer Neg Hx   . Stroke Father     SOCIAL HISTORY: Social History   Social History  . Marital Status: Widowed    Spouse Name: N/A  . Number of Children: 4  . Years of Education: college    Occupational History  .      Retired   Social History Main Topics  . Smoking status: Never Smoker   . Smokeless tobacco: Never Used     Comment: tobacco use - no  . Alcohol Use: No  . Drug Use: No  . Sexual Activity: Not on file   Other Topics Concern  . Not on file   Social History Narrative   Retired, widowed, education college education .   Right handed.   Caffeine one cup of coffee daily.       PHYSICAL EXAM  Filed Vitals:   11/26/15 1031  BP: 110/73  Pulse: 61  Height: 5\' 8"  (1.727 m)  Weight: 188 lb 6.4 oz (85.458 kg)   Body mass index is 28.65 kg/(m^2).  Generalized: Well developed, in no acute distress mild kyphosis.  Neurological examination  Mentation: Alert oriented to time, place, history taking. Follows all commands speech and language fluent Cranial nerve II-XII: Pupils were equal round reactive to light. Extraocular movements were full, visual field were full on confrontational test. Facial sensation and strength were normal. Uvula tongue midline. Head turning and shoulder shrug  were normal and symmetric. Motor: The motor testing reveals 5 over 5 strength of all 4 extremities. Good symmetric motor tone is noted throughout.  Sensory: Sensory testing is intact to soft touch on all 4 extremities. No evidence of extinction is noted. Diminished vibration from ankle down bilaterally. Diminished position sense over the toes bilaterally. Coordination: Cerebellar testing reveals good finger-nose-finger and heel-to-shin bilaterally.   Reflexes: Deep tendon reflexes are symmetric and normal bilaterally except ankle jerks are depressed Gait : Favors right hip due to pain. Uses a cane. Broad based slightly ataxic gait.   DIAGNOSTIC DATA (LABS, IMAGING, TESTING) - I reviewed patient records, labs, notes, testing and imaging myself where available.   ASSESSMENT AND PLAN 81 y.o. year old female  has a past medical history of Syncope; Allergy to radiographic dye;  Diverticulosis of colon (without mention of hemorrhage);  Irritable bowel syndrome; GERD (gastroesophageal reflux disease); Peripheral polyneuropathy ([place]); Migraine; Pulmonary nodule; Mitral regurgitation; Hemorrhoids, internal; Chronic diastolic heart failure ([place]); and Tubular adenoma. here with:  1. Headache. 2. Neuropthy. 3. Seizures  I had a long discussion the patient and her daughter regarding her headaches which seem to have improved on Topamax. She seems to be having leg swelling which is of unclear etiology but I recommend she taper the gabapentin to 1 tablet at night for 1 week and discontinue. Increase Topamax to 50 mg 2 tablets in the morning and 3 tablets at night. I discussed possible side effects and advised to call me if needed. She was encouraged to continue to use a cane for gait balance and safety precautions. She will return for follow-up in 6 months with Ward Givens, nurse practitioner call earlier if necessary.   Antony Contras, MD  11/26/2015, 12:22 PM [facility] [address], [place], [place] [address] [phone]

## 2015-11-26 NOTE — Patient Instructions (Signed)
I had a long discussion the patient and her daughter regarding her headaches which seem to have improved on Topamax. She seems to be having leg swelling which is of unclear etiology but I recommend she taper the gabapentin to 1 tablet at night for 1 week and discontinue. Increase Topamax to 50 mg 2 tablets in the morning and 3 tablets at night. I discussed possible side effects and advised to call me if needed. She was encouraged to continue to use a cane for gait balance and safety precautions. She will return for follow-up in 6 months with Ward Givens, nurse practitioner call earlier if necessary. Fall Prevention in the Home  Falls can cause injuries. They can happen to people of all ages. There are many things you can do to make your home safe and to help prevent falls.  WHAT CAN I DO ON THE OUTSIDE OF MY HOME?  Regularly fix the edges of walkways and driveways and fix any cracks.  Remove anything that might make you trip as you walk through a door, such as a raised step or threshold.  Trim any bushes or trees on the path to your home.  Use bright outdoor lighting.  Clear any walking paths of anything that might make someone trip, such as rocks or tools.  Regularly check to see if handrails are loose or broken. Make sure that both sides of any steps have handrails.  Any raised decks and porches should have guardrails on the edges.  Have any leaves, snow, or ice cleared regularly.  Use sand or salt on walking paths during winter.  Clean up any spills in your garage right away. This includes oil or grease spills. WHAT CAN I DO IN THE BATHROOM?   Use night lights.  Install grab bars by the toilet and in the tub and shower. Do not use towel bars as grab bars.  Use non-skid mats or decals in the tub or shower.  If you need to sit down in the shower, use a plastic, non-slip stool.  Keep the floor dry. Clean up any water that spills on the floor as soon as it happens.  Remove soap  buildup in the tub or shower regularly.  Attach bath mats securely with double-sided non-slip rug tape.  Do not have throw rugs and other things on the floor that can make you trip. WHAT CAN I DO IN THE BEDROOM?  Use night lights.  Make sure that you have a light by your bed that is easy to reach.  Do not use any sheets or blankets that are too big for your bed. They should not hang down onto the floor.  Have a firm chair that has side arms. You can use this for support while you get dressed.  Do not have throw rugs and other things on the floor that can make you trip. WHAT CAN I DO IN THE KITCHEN?  Clean up any spills right away.  Avoid walking on wet floors.  Keep items that you use a lot in easy-to-reach places.  If you need to reach something above you, use a strong step stool that has a grab bar.  Keep electrical cords out of the way.  Do not use floor polish or wax that makes floors slippery. If you must use wax, use non-skid floor wax.  Do not have throw rugs and other things on the floor that can make you trip. WHAT CAN I DO WITH MY STAIRS?  Do not leave any  items on the stairs.  Make sure that there are handrails on both sides of the stairs and use them. Fix handrails that are broken or loose. Make sure that handrails are as long as the stairways.  Check any carpeting to make sure that it is firmly attached to the stairs. Fix any carpet that is loose or worn.  Avoid having throw rugs at the top or bottom of the stairs. If you do have throw rugs, attach them to the floor with carpet tape.  Make sure that you have a light switch at the top of the stairs and the bottom of the stairs. If you do not have them, ask someone to add them for you. WHAT ELSE CAN I DO TO HELP PREVENT FALLS?  Wear shoes that:  Do not have high heels.  Have rubber bottoms.  Are comfortable and fit you well.  Are closed at the toe. Do not wear sandals.  If you use a stepladder:  Make  sure that it is fully opened. Do not climb a closed stepladder.  Make sure that both sides of the stepladder are locked into place.  Ask someone to hold it for you, if possible.  Clearly mark and make sure that you can see:  Any grab bars or handrails.  First and last steps.  Where the edge of each step is.  Use tools that help you move around (mobility aids) if they are needed. These include:  Canes.  Walkers.  Scooters.  Crutches.  Turn on the lights when you go into a dark area. Replace any light bulbs as soon as they burn out.  Set up your furniture so you have a clear path. Avoid moving your furniture around.  If any of your floors are uneven, fix them.  If there are any pets around you, be aware of where they are.  Review your medicines with your doctor. Some medicines can make you feel dizzy. This can increase your chance of falling. Ask your doctor what other things that you can do to help prevent falls.   This information is not intended to replace advice given to you by your health care provider. Make sure you discuss any questions you have with your health care provider.   Document Released: 10/01/2009 Document Revised: 04/21/2015 Document Reviewed: 01/09/2015 Elsevier Interactive Patient Education [employer].

## 2015-11-27 DIAGNOSIS — M8088XD Other osteoporosis with current pathological fracture, vertebra(e), subsequent encounter for fracture with routine healing: Secondary | ICD-10-CM | POA: Diagnosis not present

## 2015-11-27 DIAGNOSIS — M1611 Unilateral primary osteoarthritis, right hip: Secondary | ICD-10-CM | POA: Diagnosis not present

## 2015-11-30 DIAGNOSIS — G40909 Epilepsy, unspecified, not intractable, without status epilepticus: Secondary | ICD-10-CM | POA: Diagnosis not present

## 2015-11-30 DIAGNOSIS — E039 Hypothyroidism, unspecified: Secondary | ICD-10-CM | POA: Diagnosis not present

## 2015-11-30 DIAGNOSIS — K210 Gastro-esophageal reflux disease with esophagitis: Secondary | ICD-10-CM | POA: Diagnosis not present

## 2015-11-30 DIAGNOSIS — I872 Venous insufficiency (chronic) (peripheral): Secondary | ICD-10-CM | POA: Diagnosis not present

## 2015-11-30 DIAGNOSIS — R5383 Other fatigue: Secondary | ICD-10-CM | POA: Diagnosis not present

## 2015-11-30 DIAGNOSIS — M25551 Pain in right hip: Secondary | ICD-10-CM | POA: Diagnosis not present

## 2015-12-01 ENCOUNTER — Other Ambulatory Visit: Payer: Self-pay

## 2015-12-01 MED ORDER — TOPIRAMATE 50 MG PO TABS: ORAL_TABLET | ORAL | Status: DC

## 2015-12-18 ENCOUNTER — Telehealth: Payer: Self-pay | Admitting: Cardiology

## 2015-12-18 NOTE — Telephone Encounter (Signed)
Discussed medication with patient.  She was getting her Furosemide confused with the Fludrocortisone.  Explained that Furosemide is her fluid medication & the Fludrocortisone is her neurocardiogenic syncope.  Patient verbalized understanding and stated the names sounded similar to her.

## 2015-12-18 NOTE — Telephone Encounter (Signed)
Mrs. Hagel called today stating that she received her refill on Fludrocortisone 0.1 mg States that she received instructions and things have been changed.

## 2015-12-20 HISTORY — PX: CATARACT EXTRACTION, BILATERAL: SHX1313

## 2016-01-04 DIAGNOSIS — M5136 Other intervertebral disc degeneration, lumbar region: Secondary | ICD-10-CM | POA: Diagnosis not present

## 2016-01-04 DIAGNOSIS — M1611 Unilateral primary osteoarthritis, right hip: Secondary | ICD-10-CM | POA: Diagnosis not present

## 2016-01-20 ENCOUNTER — Other Ambulatory Visit: Payer: Self-pay | Admitting: Cardiology

## 2016-02-01 DIAGNOSIS — L84 Corns and callosities: Secondary | ICD-10-CM | POA: Diagnosis not present

## 2016-02-01 DIAGNOSIS — B351 Tinea unguium: Secondary | ICD-10-CM | POA: Diagnosis not present

## 2016-02-01 DIAGNOSIS — I70203 Unspecified atherosclerosis of native arteries of extremities, bilateral legs: Secondary | ICD-10-CM | POA: Diagnosis not present

## 2016-02-04 ENCOUNTER — Encounter: Payer: Self-pay | Admitting: Internal Medicine

## 2016-02-25 DIAGNOSIS — H04123 Dry eye syndrome of bilateral lacrimal glands: Secondary | ICD-10-CM | POA: Diagnosis not present

## 2016-02-29 DIAGNOSIS — Z85828 Personal history of other malignant neoplasm of skin: Secondary | ICD-10-CM | POA: Diagnosis not present

## 2016-02-29 DIAGNOSIS — L570 Actinic keratosis: Secondary | ICD-10-CM | POA: Diagnosis not present

## 2016-03-14 DIAGNOSIS — M1990 Unspecified osteoarthritis, unspecified site: Secondary | ICD-10-CM | POA: Diagnosis not present

## 2016-03-14 DIAGNOSIS — E039 Hypothyroidism, unspecified: Secondary | ICD-10-CM | POA: Diagnosis not present

## 2016-03-14 DIAGNOSIS — G40909 Epilepsy, unspecified, not intractable, without status epilepticus: Secondary | ICD-10-CM | POA: Diagnosis not present

## 2016-03-14 DIAGNOSIS — I872 Venous insufficiency (chronic) (peripheral): Secondary | ICD-10-CM | POA: Diagnosis not present

## 2016-03-14 DIAGNOSIS — K210 Gastro-esophageal reflux disease with esophagitis: Secondary | ICD-10-CM | POA: Diagnosis not present

## 2016-03-15 ENCOUNTER — Ambulatory Visit: Payer: Medicare Other | Admitting: Internal Medicine

## 2016-03-18 ENCOUNTER — Ambulatory Visit (INDEPENDENT_AMBULATORY_CARE_PROVIDER_SITE_OTHER): Payer: Medicare Other | Admitting: Internal Medicine

## 2016-03-18 ENCOUNTER — Encounter: Payer: Self-pay | Admitting: Internal Medicine

## 2016-03-18 VITALS — BP 135/77 | HR 58 | Temp 97.2°F | Ht 68.0 in | Wt 185.2 lb

## 2016-03-18 DIAGNOSIS — R3915 Urgency of urination: Secondary | ICD-10-CM | POA: Diagnosis not present

## 2016-03-18 DIAGNOSIS — K5909 Other constipation: Secondary | ICD-10-CM

## 2016-03-18 DIAGNOSIS — K219 Gastro-esophageal reflux disease without esophagitis: Secondary | ICD-10-CM

## 2016-03-18 DIAGNOSIS — N952 Postmenopausal atrophic vaginitis: Secondary | ICD-10-CM | POA: Diagnosis not present

## 2016-03-18 DIAGNOSIS — Z87440 Personal history of urinary (tract) infections: Secondary | ICD-10-CM | POA: Diagnosis not present

## 2016-03-18 DIAGNOSIS — N3281 Overactive bladder: Secondary | ICD-10-CM | POA: Diagnosis not present

## 2016-03-18 DIAGNOSIS — N3941 Urge incontinence: Secondary | ICD-10-CM | POA: Diagnosis not present

## 2016-03-18 NOTE — Patient Instructions (Signed)
Continue Dexilant 60 mg daily  Try Linzess 145 - one capsule daily - samples provided  Continue Benefiber 2 teaspoons twice daily  Stop Miralax  Let me know how the Linzess is working for you next week  Office visit in 6 months

## 2016-03-18 NOTE — Progress Notes (Signed)
Primary Care Physician:  Manon Hilding, MD Primary Gastroenterologist:  Dr. Gala Romney  Pre-Procedure History & Physical: HPI:  [name] is a 82 y.o. female here for GERD and constipation  -  Now on myrbretiq is causing  considerable constipation but she reports it's been of  great benefit to her bladder issues. Reflux symptoms  -  well controlled on Dexilant 60 mg daily.  Has been taking Benefiber mostly twice daily. Bowel function has deteriorated since May her new medication regimen. Has one somewhat hard bowel movement daily to every other day or every 2 days. MiraLax was too harsh for her previously.  She is seeing multiple providers since she was last seen here i.e. Cataract surgery, cancer skin cancers removed, cardiac evaluation and neurology input for chronic headaches.  Past Medical History  Diagnosis Date  . Syncope     Neurocardiogenic - positive tilt table test, on Florinef  . Allergy to radiographic dye   . Diverticulosis of colon (without mention of hemorrhage)     TCS by Dr. Gala Romney  . Irritable bowel syndrome   . GERD (gastroesophageal reflux disease)   . Peripheral polyneuropathy (HCC)     Confirmed by EMG and nerve conduction velocities   . Migraine   . Pulmonary nodule   . Mitral regurgitation   . Hemorrhoids, internal     TCS by Dr. Gala Romney  . Chronic diastolic heart failure ([place])   . Tubular adenoma     Past Surgical History  Procedure Laterality Date  . Wrist surgery    . Foot surgery    . Total abdominal hysterectomy    . Appendectomy    . Back surgery    . Back pain      going to chiropracator for back and hip pain  . Colonoscopy  06/16/2005    internal hemorrhoids, L side diverticula - Dr. Gala Romney  . Esophagogastroduodenoscopy  12/21/2006      Dr. Vivi Ferns esophagus/Patulous EG junction, small to moderate sized hiatal hernia,   multiple fundal gland type appearing polyps biopsied, otherwise normal  . Colonoscopy N/A 05/02/2013    Dr. Gala Romney-  normal rectum, scattered L sided diverticula. bx= tubular adenoma    Prior to Admission medications   Medication Sig Start Date End Date Taking? Authorizing Provider  aspirin EC 81 MG tablet Take 1 tablet (81 mg total) by mouth daily. 09/12/12  Yes Donney Dice, PA-C  Cholecalciferol (VITAMIN D-3) 1000 UNITS CAPS Take 1 capsule by mouth daily.   Yes Historical Provider, MD  DEXILANT 60 MG capsule TAKE 1 CAPSULE DAILY 05/14/15  Yes Mahala Menghini, PA-C  estradiol (ESTRACE) 0.1 MG/GM vaginal cream Place 4 g vaginally daily. Use twice a week   Yes Historical Provider, MD  fexofenadine (ALLEGRA) 180 MG tablet Take 180 mg by mouth daily.   Yes Historical Provider, MD  fludrocortisone (FLORINEF) 0.1 MG tablet TAKE 1 TABLET THREE TIMES A WEEK 01/20/16  Yes Satira Sark, MD  fluticasone [facility]) 50 MCG/ACT nasal spray Place 2 sprays into the nose as needed for allergies.    Yes Historical Provider, MD  furosemide (LASIX) 20 MG tablet Take 1 tablet (20 mg total) by mouth daily. Pt states she does not take it on Sundays and when she has a doctor's appt. 09/07/15  Yes Satira Sark, MD  Glucosamine-Chondroitin-MSM [phone] MG TABS Take 1 tablet by mouth daily.   Yes Historical Provider, MD  Hypromellose 0.3 % SOLN Apply to eye. Applies  to both eyes daily   Yes Historical Provider, MD  levothyroxine (SYNTHROID, LEVOTHROID) 75 MCG tablet Take 75 mcg by mouth daily before breakfast.   Yes Historical Provider, MD  mirabegron ER (MYRBETRIQ) 50 MG TB24 tablet Take 50 mg by mouth daily.   Yes Historical Provider, MD  montelukast (SINGULAIR) 10 MG tablet Take 10 mg by mouth daily.     Yes Historical Provider, MD  Multiple Vitamins-Minerals ([employer] SPECTRAVITE SENIOR) TABS Take 1 tablet by mouth daily.     Yes Historical Provider, MD  nitrofurantoin (MACRODANTIN) 50 MG capsule Take 50 mg by mouth at bedtime.   Yes Historical Provider, MD  nitroGLYCERIN (NITROSTAT) 0.4 MG SL tablet Place 0.4 mg under the  tongue every 5 (five) minutes as needed for chest pain. 09/12/12  Yes Donney Dice, PA-C  Omega-3 Fatty Acids (FISH OIL PO) Take by mouth daily.   Yes Historical Provider, MD  polyethylene glycol (MIRALAX / GLYCOLAX) packet Take 17 g by mouth daily.   Yes Historical Provider, MD  Probiotic Product (ALIGN PO) Take 1 tablet by mouth daily.    Yes Historical Provider, MD  topiramate (TOPAMAX) 50 MG tablet Takes 2 tablets in the morning and 2 tablet in the evening. 12/01/15  Yes Ward Givens, NP  vitamin C (ASCORBIC ACID) 500 MG tablet Take 500 mg by mouth daily.    Yes Historical Provider, MD  Wheat Dextrin (BENEFIBER DRINK MIX) PACK Take by mouth 2 (two) times daily.   Yes Historical Provider, MD    Allergies as of 03/18/2016 - Review Complete 03/18/2016  Allergen Reaction Noted  . Cephalexin Anaphylaxis and Swelling 02/25/2009  . Doxazosin mesylate Other (See Comments) 04/24/2013  . Codeine Other (See Comments) 02/25/2009  . Indomethacin Other (See Comments) 02/25/2009  . Ivp dye [iodinated diagnostic agents] Hives 04/24/2013  . Levofloxacin Other (See Comments) 02/25/2009  . Morphine Other (See Comments) 02/25/2009  . Sulfamethoxazole-trimethoprim Other (See Comments)   . Penicillins Other (See Comments) 02/25/2009    Family History  Problem Relation Age of Onset  . Colon cancer Neg Hx   . Stroke Father     Social History   Social History  . Marital Status: Widowed    Spouse Name: N/A  . Number of Children: 4  . Years of Education: college   Occupational History  .      Retired   Social History Main Topics  . Smoking status: Never Smoker   . Smokeless tobacco: Never Used     Comment: tobacco use - no  . Alcohol Use: No  . Drug Use: No  . Sexual Activity: Not on file   Other Topics Concern  . Not on file   Social History Narrative   Retired, widowed, education college education .   Right handed.   Caffeine one cup of coffee daily.     Review of Systems: See  HPI, otherwise negative ROS  Physical Exam: BP 135/77 mmHg  Pulse 58  Temp(Src) 97.2 F (36.2 C)  Ht 5\' 8"  (1.727 m)  Wt 185 lb 3.2 oz (84.006 kg)  BMI 28.17 kg/m2 General:   Alert,  Well-developed, well-nourished, pleasant and cooperative in NAD Neck:  Supple; no masses or thyromegaly. No significant cervical adenopathy. Lungs:  Clear throughout to auscultation.   No wheezes, crackles, or rhonchi. No acute distress. Heart:  Regular rate and rhythm; no murmurs, clicks, rubs,  or gallops. Abdomen: Non-distended, normal bowel sounds.  Soft and nontender without appreciable mass or hepatosplenomegaly.  Pulses:  Normal pulses noted. Extremities:  Without clubbing or edema.  Impression:  Very pleasant 82 year old lady with GERD well-controlled on Dexilant. We discussed the benefits outweighing  the risks will with long-term treatment in her particular case. Constipation likely exacerbated by the side effects of myrbetriq. She likely needs a little additional help on top of Benefiber;  MiraLax was tolerated poorly  Recommendations:  ontinue Dexilant 60 mg daily  Try Linzess 145 - one capsule daily - samples provided  Continue Benefiber 2 teaspoons twice daily  Stop Miralax  Let me know how the Linzess is working for you next week  Office visit in 6 months   Notice: This dictation was prepared with Dragon dictation along with smaller phrase technology. Any transcriptional errors that result from this process are unintentional and may not be corrected upon review.

## 2016-03-21 DIAGNOSIS — R5383 Other fatigue: Secondary | ICD-10-CM | POA: Diagnosis not present

## 2016-03-21 DIAGNOSIS — M25551 Pain in right hip: Secondary | ICD-10-CM | POA: Diagnosis not present

## 2016-03-21 DIAGNOSIS — K210 Gastro-esophageal reflux disease with esophagitis: Secondary | ICD-10-CM | POA: Diagnosis not present

## 2016-03-21 DIAGNOSIS — Z1389 Encounter for screening for other disorder: Secondary | ICD-10-CM | POA: Diagnosis not present

## 2016-03-21 DIAGNOSIS — I951 Orthostatic hypotension: Secondary | ICD-10-CM | POA: Diagnosis not present

## 2016-03-21 DIAGNOSIS — I872 Venous insufficiency (chronic) (peripheral): Secondary | ICD-10-CM | POA: Diagnosis not present

## 2016-03-21 DIAGNOSIS — E039 Hypothyroidism, unspecified: Secondary | ICD-10-CM | POA: Diagnosis not present

## 2016-03-21 DIAGNOSIS — G40909 Epilepsy, unspecified, not intractable, without status epilepticus: Secondary | ICD-10-CM | POA: Diagnosis not present

## 2016-03-22 ENCOUNTER — Telehealth: Payer: Self-pay

## 2016-03-22 NOTE — Telephone Encounter (Signed)
I tried to call pt to let her know I have the samples of Linzess for her. ( we did not have any available at her OV). She did not have a phone number listed. I called and left Vm on her son's home number for him to let the pt know to call our office. I am also mailing her a letter to pick up the samples. I am leaving her Linzess 145 mcg to take one daily 30 min before breakfast. #12

## 2016-03-22 NOTE — Telephone Encounter (Signed)
Pt called and said she had a death in the family and she will pick up the samples when she can.

## 2016-03-23 NOTE — Telephone Encounter (Signed)
Noted  

## 2016-03-30 ENCOUNTER — Telehealth: Payer: Self-pay | Admitting: Internal Medicine

## 2016-03-30 NOTE — Telephone Encounter (Signed)
Pt picked up Linzess samples on Monday and wanted to know if she should stop taking her Benefiber. Please advise and call [phone]

## 2016-03-31 NOTE — Telephone Encounter (Signed)
I spoke with the pt, she just wanted to know if RMR wanted her to take both linzess and benafiber. I advised her that he did want her to take both per RMR ov note. Pt said she understood.

## 2016-04-01 DIAGNOSIS — R3915 Urgency of urination: Secondary | ICD-10-CM | POA: Diagnosis not present

## 2016-04-01 DIAGNOSIS — N3941 Urge incontinence: Secondary | ICD-10-CM | POA: Diagnosis not present

## 2016-04-01 DIAGNOSIS — R350 Frequency of micturition: Secondary | ICD-10-CM | POA: Diagnosis not present

## 2016-04-01 DIAGNOSIS — N3281 Overactive bladder: Secondary | ICD-10-CM | POA: Diagnosis not present

## 2016-04-07 DIAGNOSIS — N3941 Urge incontinence: Secondary | ICD-10-CM | POA: Diagnosis not present

## 2016-04-11 DIAGNOSIS — L84 Corns and callosities: Secondary | ICD-10-CM | POA: Diagnosis not present

## 2016-04-11 DIAGNOSIS — B351 Tinea unguium: Secondary | ICD-10-CM | POA: Diagnosis not present

## 2016-04-11 DIAGNOSIS — I70203 Unspecified atherosclerosis of native arteries of extremities, bilateral legs: Secondary | ICD-10-CM | POA: Diagnosis not present

## 2016-04-15 DIAGNOSIS — N3941 Urge incontinence: Secondary | ICD-10-CM | POA: Diagnosis not present

## 2016-04-21 DIAGNOSIS — N3941 Urge incontinence: Secondary | ICD-10-CM | POA: Diagnosis not present

## 2016-04-28 DIAGNOSIS — J209 Acute bronchitis, unspecified: Secondary | ICD-10-CM | POA: Diagnosis not present

## 2016-04-28 DIAGNOSIS — N3941 Urge incontinence: Secondary | ICD-10-CM | POA: Diagnosis not present

## 2016-04-28 DIAGNOSIS — M79671 Pain in right foot: Secondary | ICD-10-CM | POA: Diagnosis not present

## 2016-04-28 DIAGNOSIS — R05 Cough: Secondary | ICD-10-CM | POA: Diagnosis not present

## 2016-05-04 DIAGNOSIS — J209 Acute bronchitis, unspecified: Secondary | ICD-10-CM | POA: Diagnosis not present

## 2016-05-04 DIAGNOSIS — R05 Cough: Secondary | ICD-10-CM | POA: Diagnosis not present

## 2016-05-06 DIAGNOSIS — N3281 Overactive bladder: Secondary | ICD-10-CM | POA: Diagnosis not present

## 2016-05-06 DIAGNOSIS — R3915 Urgency of urination: Secondary | ICD-10-CM | POA: Diagnosis not present

## 2016-05-06 DIAGNOSIS — N3941 Urge incontinence: Secondary | ICD-10-CM | POA: Diagnosis not present

## 2016-05-06 DIAGNOSIS — N952 Postmenopausal atrophic vaginitis: Secondary | ICD-10-CM | POA: Diagnosis not present

## 2016-05-06 DIAGNOSIS — R350 Frequency of micturition: Secondary | ICD-10-CM | POA: Diagnosis not present

## 2016-05-13 DIAGNOSIS — R350 Frequency of micturition: Secondary | ICD-10-CM | POA: Diagnosis not present

## 2016-05-13 DIAGNOSIS — N3941 Urge incontinence: Secondary | ICD-10-CM | POA: Diagnosis not present

## 2016-05-13 DIAGNOSIS — R3915 Urgency of urination: Secondary | ICD-10-CM | POA: Diagnosis not present

## 2016-05-14 ENCOUNTER — Other Ambulatory Visit: Payer: Self-pay | Admitting: Gastroenterology

## 2016-05-19 DIAGNOSIS — N3941 Urge incontinence: Secondary | ICD-10-CM | POA: Diagnosis not present

## 2016-05-26 ENCOUNTER — Ambulatory Visit (INDEPENDENT_AMBULATORY_CARE_PROVIDER_SITE_OTHER): Payer: Medicare Other | Admitting: Adult Health

## 2016-05-26 ENCOUNTER — Encounter: Payer: Self-pay | Admitting: Adult Health

## 2016-05-26 VITALS — BP 140/68 | HR 60 | Ht 68.0 in | Wt 182.8 lb

## 2016-05-26 DIAGNOSIS — R51 Headache: Secondary | ICD-10-CM | POA: Diagnosis not present

## 2016-05-26 DIAGNOSIS — R569 Unspecified convulsions: Secondary | ICD-10-CM | POA: Diagnosis not present

## 2016-05-26 DIAGNOSIS — R519 Headache, unspecified: Secondary | ICD-10-CM

## 2016-05-26 DIAGNOSIS — G629 Polyneuropathy, unspecified: Secondary | ICD-10-CM

## 2016-05-26 NOTE — Progress Notes (Signed)
I agree with the above plan 

## 2016-05-26 NOTE — Progress Notes (Signed)
PATIENT: [name] DOB: 1930-12-04  REASON FOR VISIT: follow up- sensory neuropathy, seizures, headache HISTORY FROM: patient  HISTORY OF PRESENT ILLNESS: [name] is an 82 year old female with a history of sensory neuropathy, seizures and headache. She returns today for follow-up. Overall she has been doing well. She states that she is not had any additional seizures since stopping gabapentin. She does state that she has been more sleepy since increasing her Topamax. She states that she continues to have headaches but not severe headaches. Even with a dull headache she does not take medication. She does have neuropathy in the feet. She states Aspercreme has offered her good benefit but she does not use it consistently. She recently had cataract surgery. She denies any falls but does feel off balance at times. She returns today for an evaluation.  HISTORY:  [name] is an 82 year old female with a history of sensory neuropathy, seizures and headache. She returns today complaining of ongoing headache. Last week the patient presented to [facility] emergency room for sharp shooting pains in the head. She had a CT of the head completed that was normal. She was given a prescription for carbamazepine but decided not to take this medication. She states that she continues to have a headache. However now she states her headache is located "all over" she rates her pain a 5 out of 10. Denies any sharp shooting pains. Denies nausea and vomiting. Denies photophobia and phonophobia. Denies any new medical issues. She returns today for an evaluation.  HISTORY  08/17/15: [name] is an 82 year old female with a history of sensory neuropathy and seizures. She returns today for follow-up. She continues to take Topamax and gabapentin. At the last visit it was recommended that she try Aspercreme for her paresthesias in the feet. She states that this has been beneficial. She still feels that she has  a staggering gait. She only uses a cane at bedtime. She does not want to use it during the day. She denies any falls. She does report that in the last 2-3 weeks she's been having "partial numbness" on the right side of the face near the eye in the temporal region. She states that this is accompanied by a dull discomfort. She states that she has been under more stress in the last several weeks due to a death in the family. She is unsure if stress is causing the symptoms. She states that she does have some changes in her vision when she is driving although her description is very vague. She plans to make an appointment with her ophthalmologist. Denies any weakness. Denies a facial droop. She is unsure how long the "partial numbness" and dull discomfort last. She is able to tell me that she usually wakes up with it in the middle the night. Patient is having a hard time describing her symptoms in detail. She denies any seizure events. She states that she is only having 1 seizure event in her lifetime and at that time she was taken to the hospital and does not recall what happened. She returns today for an evaluation:  Update 11/26/2015 : She returns for follow-up to complete by daughter after last visit 3 months ago. She continues to have headaches off and on and Topamax seems to help. The headache frequency is quite variable. She seems to tolerating Topamax without significant side effects. She continues to have paresthesias in her feet as well as she is more recently bothered by swelling in the  right foot and pain. She saw her primary physician who ordered x-rays which did not reveal any fracture. She also had lower extremity venous Dopplers which were negative for DVT. She saw a podiatrist who thought she had inflamed tendons. She's also had pain in the hip for which she plans to see her orthopedic doctor tomorrow. She takes gabapentin 200 mg at night and wonders if she needs it. She's not had a seizure for now 4-5  years.  REVIEW OF SYSTEMS: Out of a complete 14 system review of symptoms, the patient complains only of the following symptoms, and all other reviewed systems are negative.  Fatigue, headache, weakness, bruise/bleed easily  ALLERGIES: Allergies  Allergen Reactions  . Cephalexin Anaphylaxis and Swelling  . Doxazosin Mesylate Other (See Comments)    Made patient EXTREMELY sick.   . Codeine Other (See Comments)    Hallucinations  . Indomethacin Other (See Comments)    Severe Headache   . Ivp Dye [Iodinated Diagnostic Agents] Hives  . Levofloxacin Other (See Comments)    Unknown  . Morphine Other (See Comments)    Hallucinations   . Sulfamethoxazole-Trimethoprim Other (See Comments)    Unknown   . Penicillins Other (See Comments)    Made patient go into shock    HOME MEDICATIONS: Outpatient Prescriptions Prior to Visit  Medication Sig Dispense Refill  . aspirin EC 81 MG tablet Take 1 tablet (81 mg total) by mouth daily.    . Cholecalciferol (VITAMIN D-3) 1000 UNITS CAPS Take 1 capsule by mouth daily.    Marland Kitchen DEXILANT 60 MG capsule TAKE 1 CAPSULE DAILY 90 capsule 2  . estradiol (ESTRACE) 0.1 MG/GM vaginal cream Place 4 g vaginally daily. Use twice a week    . fexofenadine (ALLEGRA) 180 MG tablet Take 180 mg by mouth daily.    . fludrocortisone (FLORINEF) 0.1 MG tablet TAKE 1 TABLET THREE TIMES A WEEK 36 tablet 3  . fluticasone (FLONASE) 50 MCG/ACT nasal spray Place 2 sprays into the nose as needed for allergies.     . furosemide (LASIX) 20 MG tablet Take 1 tablet (20 mg total) by mouth daily. Pt states she does not take it on Sundays and when she has a doctor's appt. 90 tablet 3  . Glucosamine-Chondroitin-MSM [id number] MG TABS Take 1 tablet by mouth daily.    . Hypromellose 0.3 % SOLN Apply to eye. Applies to both eyes daily    . levothyroxine (SYNTHROID, LEVOTHROID) 75 MCG tablet Take 75 mcg by mouth daily before breakfast.    . mirabegron ER (MYRBETRIQ) 50 MG TB24 tablet Take 50  mg by mouth daily.    . montelukast (SINGULAIR) 10 MG tablet Take 10 mg by mouth daily.      . Multiple Vitamins-Minerals ([employer]S SPECTRAVITE SENIOR) TABS Take 1 tablet by mouth daily.      . nitrofurantoin (MACRODANTIN) 50 MG capsule Take 50 mg by mouth at bedtime.    . nitroGLYCERIN (NITROSTAT) 0.4 MG SL tablet Place 0.4 mg under the tongue every 5 (five) minutes as needed for chest pain.    . Omega-3 Fatty Acids (FISH OIL PO) Take by mouth daily.    . polyethylene glycol (MIRALAX / GLYCOLAX) packet Take 17 g by mouth daily.    . Probiotic Product (ALIGN PO) Take 1 tablet by mouth daily.     Marland Kitchen topiramate (TOPAMAX) 50 MG tablet Takes 2 tablets in the morning and 2 tablet in the evening. 360 tablet 3  . vitamin C (ASCORBIC  ACID) 500 MG tablet Take 500 mg by mouth daily.     . Wheat Dextrin (BENEFIBER DRINK MIX) PACK Take by mouth 2 (two) times daily.     No facility-administered medications prior to visit.    PAST MEDICAL HISTORY: Past Medical History  Diagnosis Date  . Syncope     Neurocardiogenic - positive tilt table test, on Florinef  . Allergy to radiographic dye   . Diverticulosis of colon (without mention of hemorrhage)     TCS by Dr. Gala Romney  . Irritable bowel syndrome   . GERD (gastroesophageal reflux disease)   . Peripheral polyneuropathy (HCC)     Confirmed by EMG and nerve conduction velocities   . Migraine   . Pulmonary nodule   . Mitral regurgitation   . Hemorrhoids, internal     TCS by Dr. Gala Romney  . Chronic diastolic heart failure ([place])   . Tubular adenoma     PAST SURGICAL HISTORY: Past Surgical History  Procedure Laterality Date  . Wrist surgery    . Foot surgery    . Total abdominal hysterectomy    . Appendectomy    . Back surgery    . Back pain      going to chiropracator for back and hip pain  . Colonoscopy  06/16/2005    internal hemorrhoids, L side diverticula - Dr. Gala Romney  . Esophagogastroduodenoscopy  12/21/2006      Dr. Vivi Ferns esophagus/Patulous  EG junction, small to moderate sized hiatal hernia,   multiple fundal gland type appearing polyps biopsied, otherwise normal  . Colonoscopy N/A 05/02/2013    Dr. Gala Romney- normal rectum, scattered L sided diverticula. bx= tubular adenoma    FAMILY HISTORY: Family History  Problem Relation Age of Onset  . Colon cancer Neg Hx   . Stroke Father     SOCIAL HISTORY: Social History   Social History  . Marital Status: Widowed    Spouse Name: N/A  . Number of Children: 4  . Years of Education: college   Occupational History  .      Retired   Social History Main Topics  . Smoking status: Never Smoker   . Smokeless tobacco: Never Used     Comment: tobacco use - no  . Alcohol Use: No  . Drug Use: No  . Sexual Activity: Not on file   Other Topics Concern  . Not on file   Social History Narrative   Retired, widowed, education college education .   Right handed.   Caffeine one cup of coffee daily.       PHYSICAL EXAM  Filed Vitals:   05/26/16 1020  BP: 140/68  Pulse: 60  Height: 5\' 8"  (1.727 m)  Weight: 182 lb 12.8 oz (82.918 kg)   Body mass index is 27.8 kg/(m^2).  Generalized: Well developed, in no acute distress   Neurological examination  Mentation: Alert oriented to time, place, history taking. Follows all commands speech and language fluent Cranial nerve II-XII: Pupils were equal round reactive to light. Extraocular movements were full, visual field were full on confrontational test. Facial sensation and strength were normal. Uvula tongue midline. Head turning and shoulder shrug  were normal and symmetric. Motor: The motor testing reveals 5 over 5 strength of all 4 extremities. Good symmetric motor tone is noted throughout.  Sensory: Sensory testing is intact to soft touch on all 4 extremities. Pinprick sensation decreased in the lower extremities in a stocking-like pattern. No evidence of extinction is noted.  Coordination:  Cerebellar testing reveals good  finger-nose-finger and heel-to-shin bilaterally.  Gait and station: Gait is slightly wide-based with a stooped posture. Tandem gait not attempted. Reflexes: Deep tendon reflexes are symmetric and normal bilaterally.   DIAGNOSTIC DATA (LABS, IMAGING, TESTING) - I reviewed patient records, labs, notes, testing and imaging myself where available.      ASSESSMENT AND PLAN 82 y.o. year old female  has a past medical history of Syncope; Allergy to radiographic dye; Diverticulosis of colon (without mention of hemorrhage); Irritable bowel syndrome; GERD (gastroesophageal reflux disease); Peripheral polyneuropathy ([place]); Migraine; Pulmonary nodule; Mitral regurgitation; Hemorrhoids, internal; Chronic diastolic heart failure ([place]); and Tubular adenoma. here with:  1. Seizures 2. Sensory neuropathy 3. Headaches  The patient does feel that her increase in Topamax has caused more drowsiness. I advised patient that we could switch to long-acting Topamax to see if this improved her drowsiness. She states that she will finish her current prescription. She will call me when she wants to start the long-acting Topamax. For now she will continue on the current dose. She continues to have some issues with neuropathy in the feet. I will order the patient compounded cream. Patient advised that if her symptoms worsen or she develops any new symptoms she should let us know  Ward Givens, MSN, NP-C 05/26/2016, 10:23 AM [facility] [address] [place], [place]  [id number] [phone]

## 2016-05-26 NOTE — Patient Instructions (Signed)
Continue Topamax when you finish your current prescription we can try long acting Topamax I will send off for compounded cream If your symptoms worsen or you develop new symptoms please let us know.

## 2016-05-27 DIAGNOSIS — N3941 Urge incontinence: Secondary | ICD-10-CM | POA: Diagnosis not present

## 2016-06-02 DIAGNOSIS — N3941 Urge incontinence: Secondary | ICD-10-CM | POA: Diagnosis not present

## 2016-06-08 DIAGNOSIS — I70203 Unspecified atherosclerosis of native arteries of extremities, bilateral legs: Secondary | ICD-10-CM | POA: Diagnosis not present

## 2016-06-08 DIAGNOSIS — L84 Corns and callosities: Secondary | ICD-10-CM | POA: Diagnosis not present

## 2016-06-08 DIAGNOSIS — B351 Tinea unguium: Secondary | ICD-10-CM | POA: Diagnosis not present

## 2016-06-10 DIAGNOSIS — N3941 Urge incontinence: Secondary | ICD-10-CM | POA: Diagnosis not present

## 2016-06-10 DIAGNOSIS — R3915 Urgency of urination: Secondary | ICD-10-CM | POA: Diagnosis not present

## 2016-06-10 DIAGNOSIS — R350 Frequency of micturition: Secondary | ICD-10-CM | POA: Diagnosis not present

## 2016-06-16 DIAGNOSIS — N3281 Overactive bladder: Secondary | ICD-10-CM | POA: Diagnosis not present

## 2016-06-16 DIAGNOSIS — R350 Frequency of micturition: Secondary | ICD-10-CM | POA: Diagnosis not present

## 2016-06-16 DIAGNOSIS — R3915 Urgency of urination: Secondary | ICD-10-CM | POA: Diagnosis not present

## 2016-06-16 DIAGNOSIS — N952 Postmenopausal atrophic vaginitis: Secondary | ICD-10-CM | POA: Diagnosis not present

## 2016-07-19 DIAGNOSIS — N3941 Urge incontinence: Secondary | ICD-10-CM | POA: Diagnosis not present

## 2016-07-19 DIAGNOSIS — N3281 Overactive bladder: Secondary | ICD-10-CM | POA: Diagnosis not present

## 2016-07-19 DIAGNOSIS — R350 Frequency of micturition: Secondary | ICD-10-CM | POA: Diagnosis not present

## 2016-07-19 DIAGNOSIS — N952 Postmenopausal atrophic vaginitis: Secondary | ICD-10-CM | POA: Diagnosis not present

## 2016-07-19 DIAGNOSIS — R3915 Urgency of urination: Secondary | ICD-10-CM | POA: Diagnosis not present

## 2016-07-20 DIAGNOSIS — M1611 Unilateral primary osteoarthritis, right hip: Secondary | ICD-10-CM | POA: Diagnosis not present

## 2016-07-20 DIAGNOSIS — Z6827 Body mass index (BMI) 27.0-27.9, adult: Secondary | ICD-10-CM | POA: Diagnosis not present

## 2016-07-20 DIAGNOSIS — R21 Rash and other nonspecific skin eruption: Secondary | ICD-10-CM | POA: Diagnosis not present

## 2016-07-20 DIAGNOSIS — M79671 Pain in right foot: Secondary | ICD-10-CM | POA: Diagnosis not present

## 2016-07-20 DIAGNOSIS — M5441 Lumbago with sciatica, right side: Secondary | ICD-10-CM | POA: Diagnosis not present

## 2016-07-21 ENCOUNTER — Encounter: Payer: Self-pay | Admitting: Internal Medicine

## 2016-08-17 DIAGNOSIS — B351 Tinea unguium: Secondary | ICD-10-CM | POA: Diagnosis not present

## 2016-08-17 DIAGNOSIS — L84 Corns and callosities: Secondary | ICD-10-CM | POA: Diagnosis not present

## 2016-08-17 DIAGNOSIS — I70203 Unspecified atherosclerosis of native arteries of extremities, bilateral legs: Secondary | ICD-10-CM | POA: Diagnosis not present

## 2016-08-19 DIAGNOSIS — Z87440 Personal history of urinary (tract) infections: Secondary | ICD-10-CM | POA: Diagnosis not present

## 2016-08-19 DIAGNOSIS — N3941 Urge incontinence: Secondary | ICD-10-CM | POA: Diagnosis not present

## 2016-08-19 DIAGNOSIS — N952 Postmenopausal atrophic vaginitis: Secondary | ICD-10-CM | POA: Diagnosis not present

## 2016-08-19 DIAGNOSIS — R3915 Urgency of urination: Secondary | ICD-10-CM | POA: Diagnosis not present

## 2016-08-24 DIAGNOSIS — M47897 Other spondylosis, lumbosacral region: Secondary | ICD-10-CM | POA: Diagnosis not present

## 2016-08-29 DIAGNOSIS — M9983 Other biomechanical lesions of lumbar region: Secondary | ICD-10-CM | POA: Diagnosis not present

## 2016-08-29 DIAGNOSIS — M47897 Other spondylosis, lumbosacral region: Secondary | ICD-10-CM | POA: Diagnosis not present

## 2016-08-29 DIAGNOSIS — M5126 Other intervertebral disc displacement, lumbar region: Secondary | ICD-10-CM | POA: Diagnosis not present

## 2016-08-29 DIAGNOSIS — M4856XD Collapsed vertebra, not elsewhere classified, lumbar region, subsequent encounter for fracture with routine healing: Secondary | ICD-10-CM | POA: Diagnosis not present

## 2016-08-29 DIAGNOSIS — M4316 Spondylolisthesis, lumbar region: Secondary | ICD-10-CM | POA: Diagnosis not present

## 2016-09-09 DIAGNOSIS — M47897 Other spondylosis, lumbosacral region: Secondary | ICD-10-CM | POA: Diagnosis not present

## 2016-09-09 DIAGNOSIS — M1611 Unilateral primary osteoarthritis, right hip: Secondary | ICD-10-CM | POA: Diagnosis not present

## 2016-09-12 ENCOUNTER — Encounter: Payer: Self-pay | Admitting: *Deleted

## 2016-09-13 ENCOUNTER — Encounter: Payer: Self-pay | Admitting: Cardiology

## 2016-09-13 ENCOUNTER — Ambulatory Visit (INDEPENDENT_AMBULATORY_CARE_PROVIDER_SITE_OTHER): Payer: Medicare Other | Admitting: Cardiology

## 2016-09-13 ENCOUNTER — Encounter: Payer: Self-pay | Admitting: Internal Medicine

## 2016-09-13 ENCOUNTER — Ambulatory Visit (INDEPENDENT_AMBULATORY_CARE_PROVIDER_SITE_OTHER): Payer: Medicare Other | Admitting: Internal Medicine

## 2016-09-13 VITALS — BP 127/72 | HR 65 | Temp 97.4°F | Ht 68.5 in | Wt 183.6 lb

## 2016-09-13 VITALS — BP 118/78 | HR 64 | Ht 68.0 in | Wt 185.0 lb

## 2016-09-13 DIAGNOSIS — I5032 Chronic diastolic (congestive) heart failure: Secondary | ICD-10-CM | POA: Diagnosis not present

## 2016-09-13 DIAGNOSIS — K5909 Other constipation: Secondary | ICD-10-CM

## 2016-09-13 DIAGNOSIS — K219 Gastro-esophageal reflux disease without esophagitis: Secondary | ICD-10-CM | POA: Diagnosis not present

## 2016-09-13 DIAGNOSIS — R55 Syncope and collapse: Secondary | ICD-10-CM | POA: Diagnosis not present

## 2016-09-13 NOTE — Progress Notes (Signed)
Cardiology Office Note  Date: 09/13/2016   ID: [name], DOB August 24, 1930, MRN [id number]  PCP: Manon Hilding, MD  Primary Cardiologist: Rozann Lesches, MD   Chief Complaint  Patient presents with  . Diastolic heart failure    History of Present Illness: [name] is an 82 y.o. female last seen in September 2016. She presents for a routine follow-up visit. She reports no major change in terms of shortness of breath or chest pain. She has had orthopedic problems including progressive right hip pain and is seeing an orthopedist with plan for joint injection, deferring surgery for now. She uses a cane at home, denies any falls.  She does report dependent left ankle edema, uses Lasix at least 3 or 4 days a week. She has not had any dizziness or syncope. Her weight is down about 4 pounds compared to last visit. I reviewed her ECG today which shows sinus rhythm with PAC and nonspecific T-wave changes.  She continues to follow with Neurology for management of headaches and seizures.  Past Medical History:  Diagnosis Date  . Allergy to radiographic dye   . Chronic diastolic heart failure ([place])   . Diverticulosis of colon (without mention of hemorrhage)    TCS by Dr. Gala Romney  . GERD (gastroesophageal reflux disease)   . Hemorrhoids, internal    TCS by Dr. Gala Romney  . Irritable bowel syndrome   . Migraine   . Mitral regurgitation   . Peripheral polyneuropathy ([facility])    Confirmed by EMG and nerve conduction velocities   . Pulmonary nodule   . Syncope    Neurocardiogenic - positive tilt table test, on Florinef  . Tubular adenoma     Current Outpatient Prescriptions  Medication Sig Dispense Refill  . aspirin EC 81 MG tablet Take 1 tablet (81 mg total) by mouth daily.    . Cholecalciferol (VITAMIN D-3) 1000 UNITS CAPS Take 1 capsule by mouth daily.    Marland Kitchen DEXILANT 60 MG capsule TAKE 1 CAPSULE DAILY 90 capsule 2  . estradiol (ESTRACE) 0.1 MG/GM vaginal cream Place 4 g  vaginally daily. Use twice a week    . fexofenadine (ALLEGRA) 180 MG tablet Take 180 mg by mouth daily.    . fludrocortisone (FLORINEF) 0.1 MG tablet TAKE 1 TABLET THREE TIMES A WEEK 36 tablet 3  . fluticasone (FLONASE) 50 MCG/ACT nasal spray Place 2 sprays into the nose as needed for allergies.     . furosemide (LASIX) 20 MG tablet Take 1 tablet (20 mg total) by mouth daily. Pt states she does not take it on Sundays and when she has a doctor's appt. 90 tablet 3  . Glucosamine-Chondroitin-MSM 750-400-375 MG TABS Take 1 tablet by mouth daily.    Marland Kitchen levothyroxine (SYNTHROID, LEVOTHROID) 75 MCG tablet Take 75 mcg by mouth daily before breakfast.    . mirabegron ER (MYRBETRIQ) 50 MG TB24 tablet Take 50 mg by mouth daily.    . montelukast (SINGULAIR) 10 MG tablet Take 10 mg by mouth daily.      . Multiple Vitamins-Minerals ([employer] SPECTRAVITE SENIOR) TABS Take 1 tablet by mouth daily.      . nitrofurantoin (MACRODANTIN) 50 MG capsule Take 50 mg by mouth at bedtime.    . nitroGLYCERIN (NITROSTAT) 0.4 MG SL tablet Place 0.4 mg under the tongue every 5 (five) minutes as needed for chest pain.    . Omega-3 Fatty Acids (FISH OIL PO) Take by mouth daily.    . Probiotic  Product (ALIGN PO) Take 1 tablet by mouth daily.     Marland Kitchen topiramate (TOPAMAX) 50 MG tablet Take 50 mg by mouth daily.    . vitamin C (ASCORBIC ACID) 500 MG tablet Take 500 mg by mouth daily.     . Wheat Dextrin (BENEFIBER DRINK MIX) PACK Take by mouth 2 (two) times daily.     No current facility-administered medications for this visit.    Allergies:  Cephalexin; Doxazosin mesylate; Codeine; Indomethacin; Ivp dye [iodinated diagnostic agents]; Levofloxacin; Morphine; Sulfamethoxazole-trimethoprim; and Penicillins   Social History: The patient  reports that she has never smoked. She has never used smokeless tobacco. She reports that she does not drink alcohol or use drugs.   ROS:  Please see the history of present illness. Otherwise, complete  review of systems is positive for decreased hearing.  All other systems are reviewed and negative.   Physical Exam: VS:  BP 118/78   Pulse 64   Ht 5\' 8"  (1.727 m)   Wt 185 lb (83.9 kg)   SpO2 98%   BMI 28.13 kg/m , BMI Body mass index is 28.13 kg/m.  Wt Readings from Last 3 Encounters:  09/13/16 185 lb (83.9 kg)  05/26/16 182 lb 12.8 oz (82.9 kg)  03/18/16 185 lb 3.2 oz (84 kg)    Tall statured woman, appears comfortable at rest.  HEENT: Conjunctiva and lids normal, oropharynx clear.  Neck: Supple, no elevated JVP or carotid bruits, prominent right sided carotid pulsation likely tortuous section of the vessel, no thyromegaly.  Lungs: Clear to auscultation, nonlabored breathing at rest.  Cardiac: Regular rate and rhythm, soft apical systolic murmur, no S3.  Abdomen: Soft, nontender,bowel sounds present.  Extremities: Trace to 1+ left ankle edema, prominent spider veins and ankle region left greater than right, distal pulses 2+.  ECG: I personally reviewed the tracing from 03/02/2015 which showed normal sinus rhythm.  Recent Labwork:  June 2016: Cholesterol 160, TG 77, HDL 50, LDL 95 August 2016: Hgb 13.2, platelets 132, BUN 12, creatinine 0.6, potassium 4.4  Other Studies Reviewed Today:  Echocardiogram 03/12/2014: Study Conclusions  - Left ventricle: The cavity size was normal. Wall thickness was normal. Systolic function was normal. The estimated ejection fraction was in the range of 60% to 65%. Wall motion was normal; there were no regional wall motion abnormalities. Features are consistent with a pseudonormal left ventricular filling pattern, with concomitant abnormal relaxation and increased filling pressure (grade 2 diastolic dysfunction). - Aortic valve: Trileaflet; mildly calcified leaflets. Trivial regurgitation. - Mitral valve: Mild to moderate regurgitation. - Left atrium: The atrium was at the upper limits of normal in size. Volume  index: 31.61ml/m^2 (S). - Right ventricle: The cavity size was mildly dilated. Systolic function was normal. - Right atrium: Central venous pressure: 60mm Hg (est). - Tricuspid valve: Moderate regurgitation. - Pulmonary arteries: PA peak pressure: 24mm Hg (S). - Pericardium, extracardiac: There was no pericardial effusion. Impressions:  - Normal LV wall thickness with LVEF 123456, grade 2 diastolic dysfunction. Upper normal left atrial chamber size. Mild to moderate mitral regurgitation. MIld RV dilatation with normal contraction. Moderate tricuspid regurgitation with PASP 49 mmHg.  Assessment and Plan:  1. History of chronic diastolic heart failure, overall clinically stable without progressive weight gain. She continues on low-dose Lasix. Her blood pressure is normal today.  2. Neurocardiogenic syncope, stable without recurrence. She is on Florinef.  Current medicines were reviewed with the patient today.   Orders Placed This Encounter  Procedures  .  EKG 12-Lead    Disposition: Follow-up with me in one year.  Signed, Satira Sark, MD, [facility] 09/13/2016 10:42 AM    [facility] [place], [place], [place] [address] Phone: [phone]; Fax: [phone]

## 2016-09-13 NOTE — Patient Instructions (Signed)
Continue Dexilant daily  Continue Miralax daily in the evening  Continue Benefiber daily  No more [place] visit in 6 months

## 2016-09-13 NOTE — Patient Instructions (Signed)

## 2016-09-13 NOTE — Progress Notes (Signed)
Primary Care Physician:  Manon Hilding, MD Primary Gastroenterologist:  Dr. Gala Romney  Pre-Procedure History & Physical: HPI:  [name] is a 82 y.o. female here for follow-up of GERD and constipation. Takes Benefiber daily. Take Linzess sporadically. Can't  tell much benefit. When she takes MiraLax, she seems to do better. Has not had any rectal bleeding. No abdominal pain. GERD symptoms well controlled on Dexilant 60 mg daily.  Myrbetriq has been associated with some worsening constipation and dry mouth. However, its working really good for her urological symptoms.   Past Medical History:  Diagnosis Date  . Allergy to radiographic dye   . Chronic diastolic heart failure ([place])   . Diverticulosis of colon (without mention of hemorrhage)    TCS by Dr. Gala Romney  . GERD (gastroesophageal reflux disease)   . Hemorrhoids, internal    TCS by Dr. Gala Romney  . Irritable bowel syndrome   . Migraine   . Mitral regurgitation   . Peripheral polyneuropathy ([facility])    Confirmed by EMG and nerve conduction velocities   . Pulmonary nodule   . Syncope    Neurocardiogenic - positive tilt table test, on Florinef  . Tubular adenoma     Past Surgical History:  Procedure Laterality Date  . APPENDECTOMY    . Back Pain     going to chiropracator for back and hip pain  . BACK SURGERY    . CATARACT EXTRACTION, BILATERAL  2017  . COLONOSCOPY  06/16/2005   internal hemorrhoids, L side diverticula - Dr. Gala Romney  . COLONOSCOPY N/A 05/02/2013   Dr. Gala Romney- normal rectum, scattered L sided diverticula. bx= tubular adenoma  . ESOPHAGOGASTRODUODENOSCOPY  12/21/2006     Dr. Vivi Ferns esophagus/Patulous EG junction, small to moderate sized hiatal hernia,   multiple fundal gland type appearing polyps biopsied, otherwise normal  . FOOT SURGERY    . TOTAL ABDOMINAL HYSTERECTOMY    . WRIST SURGERY      Prior to Admission medications   Medication Sig Start Date End Date Taking? Authorizing Provider  aspirin  EC 81 MG tablet Take 1 tablet (81 mg total) by mouth daily. 09/12/12  Yes Donney Dice, PA-C  Cholecalciferol (VITAMIN D-3) 1000 UNITS CAPS Take 1 capsule by mouth daily.   Yes Historical Provider, MD  DEXILANT 60 MG capsule TAKE 1 CAPSULE DAILY 05/17/16  Yes Mahala Menghini, PA-C  estradiol (ESTRACE) 0.1 MG/GM vaginal cream Place 4 g vaginally daily. Use twice a week   Yes Historical Provider, MD  fexofenadine (ALLEGRA) 180 MG tablet Take 180 mg by mouth daily.   Yes Historical Provider, MD  fludrocortisone (FLORINEF) 0.1 MG tablet TAKE 1 TABLET THREE TIMES A WEEK 01/20/16  Yes Satira Sark, MD  fluticasone [facility]) 50 MCG/ACT nasal spray Place 2 sprays into the nose as needed for allergies.    Yes Historical Provider, MD  furosemide (LASIX) 20 MG tablet Take 1 tablet (20 mg total) by mouth daily. Pt states she does not take it on Sundays and when she has a doctor's appt. 09/07/15  Yes Satira Sark, MD  Glucosamine-Chondroitin-MSM [phone] MG TABS Take 1 tablet by mouth daily.   Yes Historical Provider, MD  levothyroxine (SYNTHROID, LEVOTHROID) 75 MCG tablet Take 75 mcg by mouth daily before breakfast.   Yes Historical Provider, MD  mirabegron ER (MYRBETRIQ) 50 MG TB24 tablet Take 50 mg by mouth daily.   Yes Historical Provider, MD  montelukast (SINGULAIR) 10 MG tablet Take 10 mg by  mouth daily.     Yes Historical Provider, MD  Multiple Vitamins-Minerals ([employer]S SPECTRAVITE SENIOR) TABS Take 1 tablet by mouth daily.     Yes Historical Provider, MD  nitrofurantoin (MACRODANTIN) 50 MG capsule Take 50 mg by mouth at bedtime.   Yes Historical Provider, MD  nitroGLYCERIN (NITROSTAT) 0.4 MG SL tablet Place 0.4 mg under the tongue every 5 (five) minutes as needed for chest pain. 09/12/12  Yes Donney Dice, PA-C  Omega-3 Fatty Acids (FISH OIL PO) Take by mouth daily.   Yes Historical Provider, MD  Probiotic Product (ALIGN PO) Take 1 tablet by mouth daily.    Yes Historical Provider, MD  topiramate  (TOPAMAX) 50 MG tablet Take 50 mg by mouth daily.   Yes Historical Provider, MD  vitamin C (ASCORBIC ACID) 500 MG tablet Take 500 mg by mouth daily.    Yes Historical Provider, MD  Wheat Dextrin (BENEFIBER DRINK MIX) PACK Take by mouth 2 (two) times daily.   Yes Historical Provider, MD    Allergies as of 09/13/2016 - Review Complete 09/13/2016  Allergen Reaction Noted  . Cephalexin Anaphylaxis and Swelling 02/25/2009  . Doxazosin mesylate Other (See Comments) 04/24/2013  . Codeine Other (See Comments) 02/25/2009  . Indomethacin Other (See Comments) 02/25/2009  . Ivp dye [iodinated diagnostic agents] Hives 04/24/2013  . Levofloxacin Other (See Comments) 02/25/2009  . Morphine Other (See Comments) 02/25/2009  . Sulfamethoxazole-trimethoprim Other (See Comments)   . Penicillins Other (See Comments) 02/25/2009    Family History  Problem Relation Age of Onset  . Stroke Father   . Colon cancer Neg Hx     Social History   Social History  . Marital status: Widowed    Spouse name: N/A  . Number of children: 4  . Years of education: college   Occupational History  .      Retired   Social History Main Topics  . Smoking status: Never Smoker  . Smokeless tobacco: Never Used     Comment: tobacco use - no  . Alcohol use No  . Drug use: No  . Sexual activity: Not on file   Other Topics Concern  . Not on file   Social History Narrative   Retired, widowed, education college education .   Right handed.   Caffeine one cup of coffee daily.     Review of Systems: See HPI, otherwise negative ROS  Physical Exam: BP 127/72   Pulse 65   Temp 97.4 F (36.3 C) (Oral)   Ht 5' 8.5" (1.74 m)   Wt 183 lb 9.6 oz (83.3 kg)   BMI 27.51 kg/m  General:   elderly, pleasant and cooperative in NAD Abdomen: Non-distended, normal bowel sounds.  Soft and nontender without appreciable mass or hepatosplenomegaly.   Impression:  Pleasant 82 year old lady with GERD-well controlled on Dexilant  and constipation variably managed with Benefiber and MiraLax. She needs to be on more of a scheduled regimen to enhance management of constipation.   Recommendations:   Continue Dexilant daily  (as discussed, the benefits outweigh the risks)  Continue Miralax daily in the evening every day  Continue Benefiber -  2 teaspoons in the morning - daily  No more [place] visit in 6 months      Notice: This dictation was prepared with Dragon dictation along with smaller phrase technology. Any transcriptional errors that result from this process are unintentional and may not be corrected upon review.

## 2016-09-27 ENCOUNTER — Telehealth: Payer: Self-pay

## 2016-09-27 DIAGNOSIS — R3915 Urgency of urination: Secondary | ICD-10-CM | POA: Diagnosis not present

## 2016-09-27 DIAGNOSIS — N952 Postmenopausal atrophic vaginitis: Secondary | ICD-10-CM | POA: Diagnosis not present

## 2016-09-27 DIAGNOSIS — N3941 Urge incontinence: Secondary | ICD-10-CM | POA: Diagnosis not present

## 2016-09-27 DIAGNOSIS — Z87440 Personal history of urinary (tract) infections: Secondary | ICD-10-CM | POA: Diagnosis not present

## 2016-09-27 NOTE — Telephone Encounter (Signed)
Velvet with [facility] is calling back stating to disregard clearance for the patient.

## 2016-09-27 NOTE — Telephone Encounter (Signed)
Rn spoke with Velvet,and she stated to disregard the clearance letter. Pt is being seen for seizures. Pts surgery is in January 2018. Velvet stated if the surgeon needs neuro clearance for seizures she will fax another form over.

## 2016-09-27 NOTE — Telephone Encounter (Signed)
LFt vm for Velvet MCBride at [phone]. LFt vm per Dr Leonie Man pt was only seen for seizures. Pt has no history of stroke.Pt is on aspirin. LFt contact information to call back.

## 2016-09-28 DIAGNOSIS — M1611 Unilateral primary osteoarthritis, right hip: Secondary | ICD-10-CM | POA: Diagnosis not present

## 2016-10-10 DIAGNOSIS — I872 Venous insufficiency (chronic) (peripheral): Secondary | ICD-10-CM | POA: Diagnosis not present

## 2016-10-10 DIAGNOSIS — E039 Hypothyroidism, unspecified: Secondary | ICD-10-CM | POA: Diagnosis not present

## 2016-10-10 DIAGNOSIS — R7301 Impaired fasting glucose: Secondary | ICD-10-CM | POA: Diagnosis not present

## 2016-10-10 DIAGNOSIS — E669 Obesity, unspecified: Secondary | ICD-10-CM | POA: Diagnosis not present

## 2016-10-10 DIAGNOSIS — K210 Gastro-esophageal reflux disease with esophagitis: Secondary | ICD-10-CM | POA: Diagnosis not present

## 2016-10-10 DIAGNOSIS — G40909 Epilepsy, unspecified, not intractable, without status epilepticus: Secondary | ICD-10-CM | POA: Diagnosis not present

## 2016-10-12 DIAGNOSIS — Z6827 Body mass index (BMI) 27.0-27.9, adult: Secondary | ICD-10-CM | POA: Diagnosis not present

## 2016-10-12 DIAGNOSIS — K210 Gastro-esophageal reflux disease with esophagitis: Secondary | ICD-10-CM | POA: Diagnosis not present

## 2016-10-12 DIAGNOSIS — M25551 Pain in right hip: Secondary | ICD-10-CM | POA: Diagnosis not present

## 2016-10-12 DIAGNOSIS — R5383 Other fatigue: Secondary | ICD-10-CM | POA: Diagnosis not present

## 2016-10-12 DIAGNOSIS — G40909 Epilepsy, unspecified, not intractable, without status epilepticus: Secondary | ICD-10-CM | POA: Diagnosis not present

## 2016-10-12 DIAGNOSIS — I872 Venous insufficiency (chronic) (peripheral): Secondary | ICD-10-CM | POA: Diagnosis not present

## 2016-10-12 DIAGNOSIS — E039 Hypothyroidism, unspecified: Secondary | ICD-10-CM | POA: Diagnosis not present

## 2016-10-12 DIAGNOSIS — Z23 Encounter for immunization: Secondary | ICD-10-CM | POA: Diagnosis not present

## 2016-10-25 ENCOUNTER — Ambulatory Visit: Payer: Self-pay | Admitting: Orthopedic Surgery

## 2016-10-28 DIAGNOSIS — L84 Corns and callosities: Secondary | ICD-10-CM | POA: Diagnosis not present

## 2016-10-28 DIAGNOSIS — B351 Tinea unguium: Secondary | ICD-10-CM | POA: Diagnosis not present

## 2016-10-28 DIAGNOSIS — I70203 Unspecified atherosclerosis of native arteries of extremities, bilateral legs: Secondary | ICD-10-CM | POA: Diagnosis not present

## 2016-11-01 DIAGNOSIS — N952 Postmenopausal atrophic vaginitis: Secondary | ICD-10-CM | POA: Diagnosis not present

## 2016-11-01 DIAGNOSIS — Z87440 Personal history of urinary (tract) infections: Secondary | ICD-10-CM | POA: Diagnosis not present

## 2016-11-01 DIAGNOSIS — N3941 Urge incontinence: Secondary | ICD-10-CM | POA: Diagnosis not present

## 2016-11-21 DIAGNOSIS — Z1231 Encounter for screening mammogram for malignant neoplasm of breast: Secondary | ICD-10-CM | POA: Diagnosis not present

## 2016-11-27 ENCOUNTER — Other Ambulatory Visit: Payer: Self-pay | Admitting: Neurology

## 2016-11-28 ENCOUNTER — Telehealth: Payer: Self-pay | Admitting: *Deleted

## 2016-11-28 ENCOUNTER — Ambulatory Visit (INDEPENDENT_AMBULATORY_CARE_PROVIDER_SITE_OTHER): Payer: Medicare Other | Admitting: Adult Health

## 2016-11-28 ENCOUNTER — Encounter: Payer: Self-pay | Admitting: Adult Health

## 2016-11-28 VITALS — BP 117/63 | HR 62 | Ht 68.5 in | Wt 182.0 lb

## 2016-11-28 DIAGNOSIS — R569 Unspecified convulsions: Secondary | ICD-10-CM

## 2016-11-28 DIAGNOSIS — G629 Polyneuropathy, unspecified: Secondary | ICD-10-CM

## 2016-11-28 DIAGNOSIS — R51 Headache: Secondary | ICD-10-CM | POA: Diagnosis not present

## 2016-11-28 DIAGNOSIS — R519 Headache, unspecified: Secondary | ICD-10-CM

## 2016-11-28 NOTE — Telephone Encounter (Signed)
Faxed prescription along with demographics, insurance. (medlist and allergies) to [phone]. [phone].  Received fax confirmation.

## 2016-11-28 NOTE — Patient Instructions (Signed)
Continue topamax Will order compound cream for feet If your symptoms worsen or you develop new symptoms please let us know.

## 2016-11-28 NOTE — Progress Notes (Signed)
I agree with the assessment and plan as directed by NP .The patient is known to me .   Ravenne Wayment, MD  

## 2016-11-28 NOTE — Progress Notes (Signed)
PATIENT: [name] DOB: 05-22-1930  REASON FOR VISIT: follow up- sensory neuropathy, seizures, headaches HISTORY FROM: patient  HISTORY OF PRESENT ILLNESS: Mr. [name] is an 82 year old female with a history of sensory neuropathy, seizures and headaches. She returns today for follow-up. She continues on Topamax and is tolerating it well. She does feel it is beneficial for her headaches. She continues to have burning tingling pain in the feet. Compound cream was ordered for her at the last visit however she states that she never got. She states that she also is experiencing some numbness in the hands. She does see a podiatrist for her feet. She states that she has a bunion and joint abnormalities. She states that her surgeon told her it would take "5 surgeries to fix her feet." She states that she does have trouble with her balance. Reports that she is scheduled for hip replacement next month.However she is considering postponing this because she is concerned about getting the flu while in the hospital. Denies any falls. She uses a cane when ambulating. Returns today for an evaluation.  HISTORY 05/26/16: [name] is an 82 year old female with a history of sensory neuropathy, seizures and headache. She returns today for follow-up. Overall she has been doing well. She states that she is not had any additional seizures since stopping gabapentin. She does state that she has been more sleepy since increasing her Topamax. She states that she continues to have headaches but not severe headaches. Even with a dull headache she does not take medication. She does have neuropathy in the feet. She states Aspercreme has offered her good benefit but she does not use it consistently. She recently had cataract surgery. She denies any falls but does feel off balance at times. She returns today for an evaluation.  HISTORY:  [name] is an 82 year old female with a history of sensory neuropathy, seizures and  headache. She returns today complaining of ongoing headache. Last week the patient presented to [facility] emergency room for sharp shooting pains in the head. She had a CT of the head completed that was normal. She was given a prescription for carbamazepine but decided not to take this medication. She states that she continues to have a headache. However now she states her headache is located "all over" she rates her pain a 5 out of 10. Denies any sharp shooting pains. Denies nausea and vomiting. Denies photophobia and phonophobia. Denies any new medical issues. She returns today for an evaluation.  HISTORY  08/17/15: [name] is an 81 year old female with a history of sensory neuropathy and seizures. She returns today for follow-up. She continues to take Topamax and gabapentin. At the last visit it was recommended that she try Aspercreme for her paresthesias in the feet. She states that this has been beneficial. She still feels that she has a staggering gait. She only uses a cane at bedtime. She does not want to use it during the day. She denies any falls. She does report that in the last 2-3 weeks she's been having "partial numbness" on the right side of the face near the eye in the temporal region. She states that this is accompanied by a dull discomfort. She states that she has been under more stress in the last several weeks due to a death in the family. She is unsure if stress is causing the symptoms. She states that she does have some changes in her vision when she is driving although her description is  very vague. She plans to make an appointment with her ophthalmologist. Denies any weakness. Denies a facial droop. She is unsure how long the "partial numbness" and dull discomfort last. She is able to tell me that she usually wakes up with it in the middle the night. Patient is having a hard time describing her symptoms in detail. She denies any seizure events. She states that she is only having  1 seizure event in her lifetime and at that time she was taken to the hospital and does not recall what happened. She returns today for an evaluation:   Update 11/26/2015 : She returns for follow-up to complete by daughter after last visit 3 months ago. She continues to have headaches off and on and Topamax seems to help. The headache frequency is quite variable. She seems to tolerating Topamax without significant side effects. She continues to have paresthesias in her feet as well as she is more recently bothered by swelling in the right foot and pain. She saw her primary physician who ordered x-rays which did not reveal any fracture. She also had lower extremity venous Dopplers which were negative for DVT. She saw a podiatrist who thought she had inflamed tendons. She's also had pain in the hip for which she plans to see her orthopedic doctor tomorrow. She takes gabapentin 200 mg at night and wonders if she needs it. She's not had a seizure for now 4-5 years.   REVIEW OF SYSTEMS: Out of a complete 14 system review of symptoms, the patient complains only of the following symptoms, and all other reviewed systems are negative.  Fatigue, eye itching, eye discharge, cough, daytime sleepiness, joint pain, joint swelling, back pain, aching muscles, muscle cramps, walking difficulty, itching, weakness, headache, dizziness, incontinence of bladder, frequency urination, urgency  ALLERGIES: Allergies  Allergen Reactions  . Cephalexin Anaphylaxis and Swelling  . Doxazosin Mesylate Other (See Comments)    Made patient EXTREMELY sick.   . Codeine Other (See Comments)    Hallucinations  . Indomethacin Other (See Comments)    Severe Headache   . Ivp Dye [Iodinated Diagnostic Agents] Hives  . Levofloxacin Other (See Comments)    Unknown  . Morphine Other (See Comments)    Hallucinations   . Sulfamethoxazole-Trimethoprim Other (See Comments)    Unknown   . Penicillins Other (See Comments)    Made patient  go into shock    HOME MEDICATIONS: Outpatient Medications Prior to Visit  Medication Sig Dispense Refill  . aspirin EC 81 MG tablet Take 1 tablet (81 mg total) by mouth daily.    . Cholecalciferol (VITAMIN D-3) 1000 UNITS CAPS Take 1 capsule by mouth daily.    Marland Kitchen DEXILANT 60 MG capsule TAKE 1 CAPSULE DAILY 90 capsule 2  . estradiol (ESTRACE) 0.1 MG/GM vaginal cream Place 4 g vaginally daily. Use twice a week    . fexofenadine (ALLEGRA) 180 MG tablet Take 180 mg by mouth daily.    . fludrocortisone (FLORINEF) 0.1 MG tablet TAKE 1 TABLET THREE TIMES A WEEK 36 tablet 3  . fluticasone (FLONASE) 50 MCG/ACT nasal spray Place 2 sprays into the nose as needed for allergies.     . furosemide (LASIX) 20 MG tablet Take 1 tablet (20 mg total) by mouth daily. Pt states she does not take it on Sundays and when she has a doctor's appt. 90 tablet 3  . Glucosamine-Chondroitin-MSM 750-400-375 MG TABS Take 1 tablet by mouth daily.    Marland Kitchen levothyroxine (SYNTHROID, LEVOTHROID) 75 MCG  tablet Take 75 mcg by mouth daily before breakfast.    . mirabegron ER (MYRBETRIQ) 50 MG TB24 tablet Take 50 mg by mouth daily.    . montelukast (SINGULAIR) 10 MG tablet Take 10 mg by mouth daily.      . Multiple Vitamins-Minerals ([employer] SPECTRAVITE SENIOR) TABS Take 1 tablet by mouth daily.      . nitrofurantoin (MACRODANTIN) 50 MG capsule Take 50 mg by mouth at bedtime.    . nitroGLYCERIN (NITROSTAT) 0.4 MG SL tablet Place 0.4 mg under the tongue every 5 (five) minutes as needed for chest pain.    . Omega-3 Fatty Acids (FISH OIL PO) Take by mouth daily.    . Probiotic Product (ALIGN PO) Take 1 tablet by mouth daily.     Marland Kitchen topiramate (TOPAMAX) 50 MG tablet Take 50 mg by mouth daily.    . vitamin C (ASCORBIC ACID) 500 MG tablet Take 500 mg by mouth daily.     . Wheat Dextrin (BENEFIBER DRINK MIX) PACK Take by mouth 2 (two) times daily.     No facility-administered medications prior to visit.     PAST MEDICAL HISTORY: Past Medical  History:  Diagnosis Date  . Allergy to radiographic dye   . Chronic diastolic heart failure ([place])   . Diverticulosis of colon (without mention of hemorrhage)    TCS by Dr. Gala Romney  . GERD (gastroesophageal reflux disease)   . Hemorrhoids, internal    TCS by Dr. Gala Romney  . Irritable bowel syndrome   . Migraine   . Mitral regurgitation   . Peripheral polyneuropathy ([facility])    Confirmed by EMG and nerve conduction velocities   . Pulmonary nodule   . Syncope    Neurocardiogenic - positive tilt table test, on Florinef  . Tubular adenoma     PAST SURGICAL HISTORY: Past Surgical History:  Procedure Laterality Date  . APPENDECTOMY    . Back Pain     going to chiropracator for back and hip pain  . BACK SURGERY    . CATARACT EXTRACTION, BILATERAL  2017  . COLONOSCOPY  06/16/2005   internal hemorrhoids, L side diverticula - Dr. Gala Romney  . COLONOSCOPY N/A 05/02/2013   Dr. Gala Romney- normal rectum, scattered L sided diverticula. bx= tubular adenoma  . ESOPHAGOGASTRODUODENOSCOPY  12/21/2006     Dr. Vivi Ferns esophagus/Patulous EG junction, small to moderate sized hiatal hernia,   multiple fundal gland type appearing polyps biopsied, otherwise normal  . FOOT SURGERY    . TOTAL ABDOMINAL HYSTERECTOMY    . WRIST SURGERY      FAMILY HISTORY: Family History  Problem Relation Age of Onset  . Stroke Father   . Colon cancer Neg Hx     SOCIAL HISTORY: Social History   Social History  . Marital status: Widowed    Spouse name: N/A  . Number of children: 4  . Years of education: college   Occupational History  .      Retired   Social History Main Topics  . Smoking status: Never Smoker  . Smokeless tobacco: Never Used     Comment: tobacco use - no  . Alcohol use No  . Drug use: No  . Sexual activity: Not on file   Other Topics Concern  . Not on file   Social History Narrative   Retired, widowed, education college education .   Right handed.   Caffeine one cup of coffee daily.        PHYSICAL EXAM  Vitals:  11/28/16 1009  BP: 117/63  Pulse: 62  Weight: 182 lb (82.6 kg)  Height: 5' 8.5" (1.74 m)   Body mass index is 27.27 kg/m.  Generalized: Well developed, in no acute distress   Neurological examination  Mentation: Alert oriented to time, place, history taking. Follows all commands speech and language fluent Cranial nerve II-XII: Pupils were equal round reactive to light. Extraocular movements were full, visual field were full on confrontational test. Facial  strength is normal. Patient feels that the sensation on the right side of the face is slightly different in the left. Uvula tongue midline. Head turning and shoulder shrug  were normal and symmetric. Motor: The motor testing reveals 5 over 5 strength of all 4 extremities. Good symmetric motor tone is noted throughout.  Sensory: Sensory testing is intact to soft touch on all 4 extremities. Pinprick sensation decreased in the lower extremities in a stocking-like pattern bilaterally. No evidence of extinction is noted.  Coordination: Cerebellar testing reveals good finger-nose-finger and heel-to-shin bilaterally.  Gait and station: Patient uses a cane when ambulating. Tandem gait not attempted. Reflexes: Deep tendon reflexes are symmetric and normal bilaterally.   DIAGNOSTIC DATA (LABS, IMAGING, TESTING) - I reviewed patient records, labs, notes, testing and imaging myself where available.       ASSESSMENT AND PLAN 82 y.o. year old female  has a past medical history of Allergy to radiographic dye; Chronic diastolic heart failure ([place]); Diverticulosis of colon (without mention of hemorrhage); GERD (gastroesophageal reflux disease); Hemorrhoids, internal; Irritable bowel syndrome; Migraine; Mitral regurgitation; Peripheral polyneuropathy ([place]); Pulmonary nodule; Syncope; and Tubular adenoma. here with:  1. Sensory neuropathy 2. Seizures 3. Headache  Overall the patient is doing well. She will  continue on Topamax. I will order compound cream for neuropathic pain in the lower extremities. Patient advised that if this is not beneficial she should let us know. She will follow-up in 6 months with Dr. Mechele Claude, MSN, NP-C 11/28/2016, 10:52 AM [facility] [address] [place], [place] [address] [phone]

## 2016-12-01 DIAGNOSIS — N952 Postmenopausal atrophic vaginitis: Secondary | ICD-10-CM | POA: Diagnosis not present

## 2016-12-01 DIAGNOSIS — N3941 Urge incontinence: Secondary | ICD-10-CM | POA: Diagnosis not present

## 2016-12-01 DIAGNOSIS — R8299 Other abnormal findings in urine: Secondary | ICD-10-CM | POA: Diagnosis not present

## 2016-12-01 DIAGNOSIS — R351 Nocturia: Secondary | ICD-10-CM | POA: Diagnosis not present

## 2016-12-01 DIAGNOSIS — Z87440 Personal history of urinary (tract) infections: Secondary | ICD-10-CM | POA: Diagnosis not present

## 2016-12-01 NOTE — Telephone Encounter (Signed)
Ok for alternative formula

## 2016-12-01 NOTE — Telephone Encounter (Signed)
Received notification of alternative formula from [employer].

## 2016-12-02 NOTE — Telephone Encounter (Signed)
[name] saw the patient on 11/28/16 kindly ask her if she change the dosage

## 2016-12-05 ENCOUNTER — Telehealth: Payer: Self-pay

## 2016-12-05 MED ORDER — TOPIRAMATE 50 MG PO TABS: 50.0000 mg | ORAL_TABLET | Freq: Every day | ORAL | 1 refills | Status: DC

## 2016-12-05 NOTE — Telephone Encounter (Signed)
Please call and clarify how the patient is taking Topamax

## 2016-12-05 NOTE — Telephone Encounter (Signed)
[name]: Please call and clarify how the patient is taking Topamax

## 2016-12-06 DIAGNOSIS — N390 Urinary tract infection, site not specified: Secondary | ICD-10-CM | POA: Diagnosis not present

## 2016-12-06 DIAGNOSIS — B961 Klebsiella pneumoniae [K. pneumoniae] as the cause of diseases classified elsewhere: Secondary | ICD-10-CM | POA: Diagnosis not present

## 2016-12-06 NOTE — Telephone Encounter (Signed)
Spoke with patient who stated she is taking Topamax as instructed by Jinny Blossom, taking 2 tabs in morning and 3 tabs in the evening. This RN advised her that during her last office visit she reported sleepiness from medication. She was to call Jinny Blossom when she completed the prescription to have it changed to long acting.  She stated she is having so much pain in her feet and her hip that sleeping well is helpful. She stated she does not want to make the decision about switching to long acting medication. She requested Jinny Blossom make the decision, stating "I love her, and I trust her to make that decision." She then repeated that she "didn't know  what she'd do if she didn't sleep well."  This RN stated message would be sent to [facility]. She verbalized understanding, appreciation.

## 2016-12-07 DIAGNOSIS — B961 Klebsiella pneumoniae [K. pneumoniae] as the cause of diseases classified elsewhere: Secondary | ICD-10-CM | POA: Diagnosis not present

## 2016-12-07 DIAGNOSIS — N390 Urinary tract infection, site not specified: Secondary | ICD-10-CM | POA: Diagnosis not present

## 2016-12-07 NOTE — Telephone Encounter (Signed)
Rn call patients number but it just rang. Rn call [facility] and the pharmacy stated they will call the pt about picking up rx.

## 2016-12-08 DIAGNOSIS — B961 Klebsiella pneumoniae [K. pneumoniae] as the cause of diseases classified elsewhere: Secondary | ICD-10-CM | POA: Diagnosis not present

## 2016-12-08 DIAGNOSIS — N390 Urinary tract infection, site not specified: Secondary | ICD-10-CM | POA: Diagnosis not present

## 2016-12-09 DIAGNOSIS — N390 Urinary tract infection, site not specified: Secondary | ICD-10-CM | POA: Diagnosis not present

## 2016-12-09 DIAGNOSIS — B961 Klebsiella pneumoniae [K. pneumoniae] as the cause of diseases classified elsewhere: Secondary | ICD-10-CM | POA: Diagnosis not present

## 2016-12-15 MED ORDER — TOPIRAMATE 50 MG PO TABS: 100.0000 mg | ORAL_TABLET | Freq: Two times a day (BID) | ORAL | 5 refills | Status: DC

## 2016-12-15 NOTE — Telephone Encounter (Signed)
Pt called to advise she has been taking topiramate (TOPAMAX) 50 MG tablet 2 tabs am and 3 at bedtime. She said the new refill directions are 1 tab daily (her son picked up the refill). Said she has been taking 5 tab/day since last year. Please call [phone]

## 2016-12-15 NOTE — Telephone Encounter (Signed)
Pt has previously been prescribed 100 mg BID. New rx e-scribed to pt's pharmacy.

## 2016-12-15 NOTE — Addendum Note (Signed)
Addended by: Monte Fantasia on: 12/15/2016 02:17 PM   Modules accepted: Orders

## 2016-12-21 DIAGNOSIS — E039 Hypothyroidism, unspecified: Secondary | ICD-10-CM | POA: Diagnosis not present

## 2016-12-21 DIAGNOSIS — R42 Dizziness and giddiness: Secondary | ICD-10-CM | POA: Diagnosis not present

## 2016-12-21 DIAGNOSIS — I951 Orthostatic hypotension: Secondary | ICD-10-CM | POA: Diagnosis not present

## 2016-12-21 DIAGNOSIS — G40909 Epilepsy, unspecified, not intractable, without status epilepticus: Secondary | ICD-10-CM | POA: Diagnosis not present

## 2016-12-21 DIAGNOSIS — Z6827 Body mass index (BMI) 27.0-27.9, adult: Secondary | ICD-10-CM | POA: Diagnosis not present

## 2016-12-21 DIAGNOSIS — R51 Headache: Secondary | ICD-10-CM | POA: Diagnosis not present

## 2016-12-21 DIAGNOSIS — M1611 Unilateral primary osteoarthritis, right hip: Secondary | ICD-10-CM | POA: Diagnosis not present

## 2016-12-22 ENCOUNTER — Telehealth: Payer: Self-pay | Admitting: Adult Health

## 2016-12-22 NOTE — Telephone Encounter (Addendum)
Pt called to advise she is having a total hip replacement on 01/11/17, Dr Wynelle Link is requesting a letter for clearance. Pt also needs clarification on topiramate (TOPAMAX) 50 MG tablet directions. Advised she has been taking 2 tabs am and 3 tabs pm for about 1 year. She said when she picked up RX yesterday the directions read 2 am and 2 pm.

## 2016-12-22 NOTE — Telephone Encounter (Signed)
It does appear that pt was taking topiramate 50mg  2 tablets in the morning and 3 tablets in the evening. However, that RX was discontinued on 09/13/2016 for a dose change.  I will send to [facility], NP to review and ensure the correct dosing and to review surgical clearance for this pt.

## 2016-12-26 MED ORDER — TOPIRAMATE 50 MG PO TABS: ORAL_TABLET | ORAL | 5 refills | Status: DC

## 2016-12-26 NOTE — Addendum Note (Signed)
Addended by: Trudie Buckler on: 12/26/2016 10:00 AM   Modules accepted: Orders

## 2016-12-26 NOTE — Telephone Encounter (Signed)
Order corrected. Dr. Leonie Man originally ordered Topamax 50 mg 2 tabs in the morning and 3 tabs in the evening. A new prescription was sent to reflect this.

## 2016-12-27 DIAGNOSIS — Z961 Presence of intraocular lens: Secondary | ICD-10-CM | POA: Diagnosis not present

## 2016-12-27 DIAGNOSIS — H43393 Other vitreous opacities, bilateral: Secondary | ICD-10-CM | POA: Diagnosis not present

## 2016-12-27 NOTE — Telephone Encounter (Signed)
I see this patient for headache, neuropathy and seizures which appear to be stable and don't think she needs to stop any of these medications prior to her surgery. We typically are not asked for neurological clearance for these kind of patients but from my standpoint she is neurologically cleared for hip surgery

## 2016-12-28 NOTE — Telephone Encounter (Addendum)
LMVM for pt that did reorder prescription for the topamax 50mg  tabs ( take 2 in AM and 3 in PM).  Sent to pharmacy.  Also clearance for hip given as per below.  Pt to call back if questions.  Faxed clearance note to Dr. Wynelle Link [phone] with confirmation.

## 2016-12-29 DIAGNOSIS — N3941 Urge incontinence: Secondary | ICD-10-CM | POA: Diagnosis not present

## 2016-12-29 DIAGNOSIS — N3281 Overactive bladder: Secondary | ICD-10-CM | POA: Diagnosis not present

## 2016-12-29 DIAGNOSIS — R350 Frequency of micturition: Secondary | ICD-10-CM | POA: Diagnosis not present

## 2016-12-29 DIAGNOSIS — R3915 Urgency of urination: Secondary | ICD-10-CM | POA: Diagnosis not present

## 2016-12-29 DIAGNOSIS — N952 Postmenopausal atrophic vaginitis: Secondary | ICD-10-CM | POA: Diagnosis not present

## 2016-12-30 DIAGNOSIS — L84 Corns and callosities: Secondary | ICD-10-CM | POA: Diagnosis not present

## 2016-12-30 DIAGNOSIS — N390 Urinary tract infection, site not specified: Secondary | ICD-10-CM | POA: Diagnosis not present

## 2016-12-30 DIAGNOSIS — I70203 Unspecified atherosclerosis of native arteries of extremities, bilateral legs: Secondary | ICD-10-CM | POA: Diagnosis not present

## 2016-12-30 DIAGNOSIS — B351 Tinea unguium: Secondary | ICD-10-CM | POA: Diagnosis not present

## 2017-01-05 NOTE — Patient Instructions (Signed)
[place]  01/05/2017   Your procedure is scheduled on: 01/11/2017    Report to [facility] Main  Entrance take [place]  elevators to 3rd floor to  [place] at     [phone] AM.  Call this number if you have problems the morning of surgery [phone]   Remember: ONLY 1 PERSON MAY GO WITH YOU TO SHORT STAY TO GET  READY MORNING OF [place].  Do not eat food or drink liquids :After Midnight.     Take these medicines the morning of surgery with A SIP OF WATER: Allegra, Flonase if needed, Synthroid                                 You may not have any metal on your body including hair pins and              piercings  Do not wear jewelry, make-up, lotions, powders or perfumes, deodorant             Do not wear nail polish.  Do not shave  48 hours prior to surgery.              Do not bring valuables to the hospital. [place].  Contacts, dentures or bridgework may not be worn into surgery.  Leave suitcase in the car. After surgery it may be brought to your room.         Special Instructions: N/A              Please read over the following fact sheets you were given: _____________________________________________________________________             [facility] - Preparing for Surgery Before surgery, you can play an important role.  Because skin is not sterile, your skin needs to be as free of germs as possible.  You can reduce the number of germs on your skin by washing with CHG (chlorahexidine gluconate) soap before surgery.  CHG is an antiseptic cleaner which kills germs and bonds with the skin to continue killing germs even after washing. Please DO NOT use if you have an allergy to CHG or antibacterial soaps.  If your skin becomes reddened/irritated stop using the CHG and inform your nurse when you arrive at Short Stay. Do not shave (including legs and underarms) for at least 48 hours prior to the  first CHG shower.  You may shave your face/neck. Please follow these instructions carefully:  1.  Shower with CHG Soap the night before surgery and the  morning of Surgery.  2.  If you choose to wash your hair, wash your hair first as usual with your  normal  shampoo.  3.  After you shampoo, rinse your hair and body thoroughly to remove the  shampoo.                           4.  Use CHG as you would any other liquid soap.  You can apply chg directly  to the skin and wash                       Gently with a scrungie  or clean washcloth.  5.  Apply the CHG Soap to your body ONLY FROM THE NECK DOWN.   Do not use on face/ open                           Wound or open sores. Avoid contact with eyes, ears mouth and genitals (private parts).                       Wash face,  Genitals (private parts) with your normal soap.             6.  Wash thoroughly, paying special attention to the area where your surgery  will be performed.  7.  Thoroughly rinse your body with warm water from the neck down.  8.  DO NOT shower/wash with your normal soap after using and rinsing off  the CHG Soap.                9.  Pat yourself dry with a clean towel.            10.  Wear clean pajamas.            11.  Place clean sheets on your bed the night of your first shower and do not  sleep with pets. Day of Surgery : Do not apply any lotions/deodorants the morning of surgery.  Please wear clean clothes to the hospital/surgery center.  FAILURE TO FOLLOW THESE INSTRUCTIONS MAY RESULT IN THE CANCELLATION OF YOUR SURGERY PATIENT SIGNATURE_________________________________  NURSE SIGNATURE__________________________________  ________________________________________________________________________  WHAT IS A BLOOD TRANSFUSION? Blood Transfusion Information  A transfusion is the replacement of blood or some of its parts. Blood is made up of multiple cells which provide different functions.  Red blood cells carry oxygen and are  used for blood loss replacement.  White blood cells fight against infection.  Platelets control bleeding.  Plasma helps clot blood.  Other blood products are available for specialized needs, such as hemophilia or other clotting disorders. BEFORE THE TRANSFUSION  Who gives blood for transfusions?   Healthy volunteers who are fully evaluated to make sure their blood is safe. This is blood bank blood. Transfusion therapy is the safest it has ever been in the practice of medicine. Before blood is taken from a donor, a complete history is taken to make sure that person has no history of diseases nor engages in risky social behavior (examples are intravenous drug use or sexual activity with multiple partners). The donor's travel history is screened to minimize risk of transmitting infections, such as malaria. The donated blood is tested for signs of infectious diseases, such as HIV and hepatitis. The blood is then tested to be sure it is compatible with you in order to minimize the chance of a transfusion reaction. If you or a relative donates blood, this is often done in anticipation of surgery and is not appropriate for emergency situations. It takes many days to process the donated blood. RISKS AND COMPLICATIONS Although transfusion therapy is very safe and saves many lives, the main dangers of transfusion include:   Getting an infectious disease.  Developing a transfusion reaction. This is an allergic reaction to something in the blood you were given. Every precaution is taken to prevent this. The decision to have a blood transfusion has been considered carefully by your caregiver before blood is given. Blood is not given unless the benefits outweigh the risks. AFTER THE  TRANSFUSION  Right after receiving a blood transfusion, you will usually feel much better and more energetic. This is especially true if your red blood cells have gotten low (anemic). The transfusion raises the level of the red  blood cells which carry oxygen, and this usually causes an energy increase.  The nurse administering the transfusion will monitor you carefully for complications. HOME CARE INSTRUCTIONS  No special instructions are needed after a transfusion. You may find your energy is better. Speak with your caregiver about any limitations on activity for underlying diseases you may have. SEEK MEDICAL CARE IF:   Your condition is not improving after your transfusion.  You develop redness or irritation at the intravenous (IV) site. SEEK IMMEDIATE MEDICAL CARE IF:  Any of the following symptoms occur over the next 12 hours:  Shaking chills.  You have a temperature by mouth above 102 F (38.9 C), not controlled by medicine.  Chest, back, or muscle pain.  People around you feel you are not acting correctly or are confused.  Shortness of breath or difficulty breathing.  Dizziness and fainting.  You get a rash or develop hives.  You have a decrease in urine output.  Your urine turns a dark color or changes to pink, red, or brown. Any of the following symptoms occur over the next 10 days:  You have a temperature by mouth above 102 F (38.9 C), not controlled by medicine.  Shortness of breath.  Weakness after normal activity.  The white part of the eye turns yellow (jaundice).  You have a decrease in the amount of urine or are urinating less often.  Your urine turns a dark color or changes to pink, red, or brown. Document Released: 12/02/2000 Document Revised: 02/27/2012 Document Reviewed: 07/21/2008 ExitCare Patient Information 2014 [place].  _______________________________________________________________________  Incentive Spirometer  An incentive spirometer is a tool that can help keep your lungs clear and active. This tool measures how well you are filling your lungs with each breath. Taking long deep breaths may help reverse or decrease the chance of developing breathing  (pulmonary) problems (especially infection) following:  A long period of time when you are unable to move or be active. BEFORE THE PROCEDURE   If the spirometer includes an indicator to show your best effort, your nurse or respiratory therapist will set it to a desired goal.  If possible, sit up straight or lean slightly forward. Try not to slouch.  Hold the incentive spirometer in an upright position. INSTRUCTIONS FOR USE  1. Sit on the edge of your bed if possible, or sit up as far as you can in bed or on a chair. 2. Hold the incentive spirometer in an upright position. 3. Breathe out normally. 4. Place the mouthpiece in your mouth and seal your lips tightly around it. 5. Breathe in slowly and as deeply as possible, raising the piston or the ball toward the top of the column. 6. Hold your breath for 3-5 seconds or for as long as possible. Allow the piston or ball to fall to the bottom of the column. 7. Remove the mouthpiece from your mouth and breathe out normally. 8. Rest for a few seconds and repeat Steps 1 through 7 at least 10 times every 1-2 hours when you are awake. Take your time and take a few normal breaths between deep breaths. 9. The spirometer may include an indicator to show your best effort. Use the indicator as a goal to work toward during each repetition.  10. After each set of 10 deep breaths, practice coughing to be sure your lungs are clear. If you have an incision (the cut made at the time of surgery), support your incision when coughing by placing a pillow or rolled up towels firmly against it. Once you are able to get out of bed, walk around indoors and cough well. You may stop using the incentive spirometer when instructed by your caregiver.  RISKS AND COMPLICATIONS  Take your time so you do not get dizzy or light-headed.  If you are in pain, you may need to take or ask for pain medication before doing incentive spirometry. It is harder to take a deep breath if you  are having pain. AFTER USE  Rest and breathe slowly and easily.  It can be helpful to keep track of a log of your progress. Your caregiver can provide you with a simple table to help with this. If you are using the spirometer at home, follow these instructions: [place] IF:   You are having difficultly using the spirometer.  You have trouble using the spirometer as often as instructed.  Your pain medication is not giving enough relief while using the spirometer.  You develop fever of 100.5 F (38.1 C) or higher. SEEK IMMEDIATE MEDICAL CARE IF:   You cough up bloody sputum that had not been present before.  You develop fever of 102 F (38.9 C) or greater.  You develop worsening pain at or near the incision site. MAKE SURE YOU:   Understand these instructions.  Will watch your condition.  Will get help right away if you are not doing well or get worse. Document Released: 04/17/2007 Document Revised: 02/27/2012 Document Reviewed: 06/18/2007 [facility] Patient Information 2014 [place], [place].   ________________________________________________________________________

## 2017-01-06 ENCOUNTER — Encounter (HOSPITAL_COMMUNITY)
Admission: RE | Admit: 2017-01-06 | Discharge: 2017-01-06 | Disposition: A | Discharge status: Home / Self Care | Payer: Medicare Other | Source: Ambulatory Visit | Attending: Orthopedic Surgery | Admitting: Orthopedic Surgery

## 2017-01-06 ENCOUNTER — Encounter (HOSPITAL_COMMUNITY): Payer: Self-pay

## 2017-01-06 ENCOUNTER — Ambulatory Visit: Payer: Self-pay | Admitting: Orthopedic Surgery

## 2017-01-06 DIAGNOSIS — I340 Nonrheumatic mitral (valve) insufficiency: Secondary | ICD-10-CM | POA: Insufficient documentation

## 2017-01-06 DIAGNOSIS — Z91041 Radiographic dye allergy status: Secondary | ICD-10-CM | POA: Insufficient documentation

## 2017-01-06 DIAGNOSIS — R269 Unspecified abnormalities of gait and mobility: Secondary | ICD-10-CM | POA: Diagnosis not present

## 2017-01-06 DIAGNOSIS — Z9181 History of falling: Secondary | ICD-10-CM | POA: Insufficient documentation

## 2017-01-06 DIAGNOSIS — Z01812 Encounter for preprocedural laboratory examination: Secondary | ICD-10-CM | POA: Diagnosis not present

## 2017-01-06 DIAGNOSIS — I5032 Chronic diastolic (congestive) heart failure: Secondary | ICD-10-CM | POA: Insufficient documentation

## 2017-01-06 DIAGNOSIS — R0609 Other forms of dyspnea: Secondary | ICD-10-CM | POA: Insufficient documentation

## 2017-01-06 DIAGNOSIS — I2789 Other specified pulmonary heart diseases: Secondary | ICD-10-CM | POA: Insufficient documentation

## 2017-01-06 HISTORY — DX: Unspecified convulsions: R56.9

## 2017-01-06 HISTORY — DX: Urinary tract infection, site not specified: N39.0

## 2017-01-06 HISTORY — DX: Unspecified osteoarthritis, unspecified site: M19.90

## 2017-01-06 LAB — CBC
HEMATOCRIT: 38.1 % (ref 36.0–46.0)
HEMOGLOBIN: 12.4 g/dL (ref 12.0–15.0)
MCH: 27.3 pg (ref 26.0–34.0)
MCHC: 32.5 g/dL (ref 30.0–36.0)
MCV: 83.7 fL (ref 78.0–100.0)
Platelets: 125 10*3/uL — ABNORMAL LOW (ref 150–400)
RBC: 4.55 MIL/uL (ref 3.87–5.11)
RDW: 14.2 % (ref 11.5–15.5)
WBC: 5 10*3/uL (ref 4.0–10.5)

## 2017-01-06 LAB — COMPREHENSIVE METABOLIC PANEL
ALBUMIN: 3.9 g/dL (ref 3.5–5.0)
ALK PHOS: 57 U/L (ref 38–126)
ALT: 14 U/L (ref 14–54)
ANION GAP: 5 (ref 5–15)
AST: 22 U/L (ref 15–41)
BILIRUBIN TOTAL: 0.4 mg/dL (ref 0.3–1.2)
BUN: 15 mg/dL (ref 6–20)
CO2: 28 mmol/L (ref 22–32)
Calcium: 9.2 mg/dL (ref 8.9–10.3)
Chloride: 109 mmol/L (ref 101–111)
Creatinine, Ser: 0.67 mg/dL (ref 0.44–1.00)
GLUCOSE: 105 mg/dL — AB (ref 65–99)
POTASSIUM: 3.7 mmol/L (ref 3.5–5.1)
Sodium: 142 mmol/L (ref 135–145)
Total Protein: 6.6 g/dL (ref 6.5–8.1)

## 2017-01-06 LAB — URINALYSIS, ROUTINE W REFLEX MICROSCOPIC
BILIRUBIN URINE: NEGATIVE
GLUCOSE, UA: NEGATIVE mg/dL
HGB URINE DIPSTICK: NEGATIVE
Ketones, ur: NEGATIVE mg/dL
Leukocytes, UA: NEGATIVE
Nitrite: NEGATIVE
Protein, ur: NEGATIVE mg/dL
Specific Gravity, Urine: 1.017 (ref 1.005–1.030)
pH: 6 (ref 5.0–8.0)

## 2017-01-06 LAB — PROTIME-INR
INR: 1.01
Prothrombin Time: 13.3 seconds (ref 11.4–15.2)

## 2017-01-06 LAB — ABO/RH: ABO/RH(D): O NEG

## 2017-01-06 LAB — APTT: APTT: 29 s (ref 24–36)

## 2017-01-06 LAB — SURGICAL PCR SCREEN
MRSA, PCR: NEGATIVE
Staphylococcus aureus: POSITIVE — AB

## 2017-01-06 NOTE — Progress Notes (Signed)
DR Domenic Polite- 09/13/16- clearance on chart  EKG- 09/13/16- EPIC  11/28/16- LOV - neuro - epic  09/13/16- LOV- card-epic

## 2017-01-06 NOTE — H&P (Signed)
[name] DOB: 1930/01/29 Married / Language: English / Race: White Female Date of Admission:  01/11/2017  CC:  Right hip pain History of Present Illness  The patient is a 83 year old female who comes in for a preoperative History and Physical. The patient is scheduled for a right total hip arthroplasty (anterior) to be performed by Dr. Dione Plover. Aluisio, MD at [facility] on 01-11-2017. The patient is a 83 year old female who presented for follow up of their back. The patient is being followed for osteoarthritis. They are now 6 month(s) (1/2) out from when symptoms began. Symptoms reported today include: pain. The patient states that they are doing poorly. The following medication has been used for pain control: none. The patient reports their current pain level to be moderate to severe. [name] was seen for her right hip pain. She did have the lumbar MRI and it just showed some mild stenosis. Did not have anything operative. She did have a degenerative scoliosis. More of her pain however is in her right hip. It is in the groin and lateral hip. It does radiate down to her thigh. It is definitely limiting what she can and cannot do. Radiographs are reviewed AP pelvis and lateral of the right hip and she has got severe bone on bone arthritis of that hip with subchondral cystic changes. At this point, the most predictable means of improving pain and function is total hip arthroplasty. The procedure, risks, potential complications and rehab course are discussed in detail and the patient elects to proceed. They have been treated conservatively in the past for the above stated problem and despite conservative measures, they continue to have progressive pain and severe functional limitations and dysfunction. They have failed non-operative management including home exercise, medications, and injections. It is felt that they would benefit from undergoing total joint replacement. Risks and  benefits of the procedure have been discussed with the patient and they elect to proceed with surgery. There are no active contraindications to surgery such as ongoing infection or rapidly progressive neurological disease.  Problem List/Past Medical Other osteoarthritis of spine, lumbosacral region [id number])  Primary osteoarthritis of right hip (M16.11)  Urinary Incontinence  Diverticulosis  Hiatal Hernia  Bronchitis  Impaired Hearing  Impaired Vision  Migraine Headache  Seizure Disorder  Congestive Heart Failure  Gastroesophageal Reflux Disease  Hypothyroidism  Osteoarthritis  Peripheral Neuropathy  Skin Cancer   Allergies Penicillin G Benzathine *PENICILLINS*  Iodinated Contrast  Codeine Phosphate *ANALGESICS - OPIOID*  Bactrim *ANTI-INFECTIVE AGENTS - MISC.*  Trimethoprim *ANTI-INFECTIVE AGENTS - MISC.*  Morphine Sulfate (Concentrate) *ANALGESICS - OPIOID*  Levaquin *FLUOROQUINOLONES*  Indocin *ANALGESICS - ANTI-INFLAMMATORY*   Family History Cancer  Brother, Sister. Cerebrovascular Accident  Brother, Father, Mother, Sister. Diabetes Mellitus  Mother. Drug / Alcohol Addiction  Brother. Hypertension  Brother, Sister. Rheumatoid Arthritis  Father, Maternal Grandfather, Sister.  Social History Children  4 Current work status  retired Furniture conservator/restorer weekly; does other Living situation  live alone Marital status  widowed Never consumed alcohol  08/24/2016: Never consumed alcohol No history of drug/alcohol rehab  Not under pain contract  Number of flights of stairs before winded  2-3 Tobacco use  Never smoker. 08/24/2016 Advance Directives  Living Will, Healthcare POA  Medication History  Topiramate (50MG  Tablet, Oral) Active. Calcium Citrate (200MG  Tablet, 1 (one) Oral) Active. Vitamin C (500MG  Tablet, 1 (one) Oral) Active. Bioflex (Oral) Active. Fish Oil (1200MG  Capsule, Oral) Active. Omega 3 (1000MG  Capsule,  Oral) Active. Vitamin D (1000UNIT Tablet, Oral) Active. Multiple Vitamin (1 (one) Oral) Active. Fludrocortisone Acetate (0.1MG  Tablet, Oral) Active. Montelukast Sodium (10MG  Tablet, Oral) Active. Nitrofurantoin Macrocrystal (50MG  Capsule, Oral) Active. Aspirin (81MG  Capsule, 1 (one) Oral) Active. Dexilant (60MG  Capsule DR, Oral) Active.  Past Surgical History Appendectomy  Carpal Tunnel Repair  bilateral Cataract Surgery  bilateral Foot Surgery  right Gallbladder Surgery  laporoscopic Hysterectomy  complete (non-cancerous) Spinal Surgery   Review of Systems General Not Present- Chills, Fatigue, Fever, Memory Loss, Night Sweats, Weight Gain and Weight Loss. Skin Not Present- Eczema, Hives, Itching, Lesions and Rash. HEENT Not Present- Dentures, Double Vision, Headache, Hearing Loss, Tinnitus and Visual Loss. Respiratory Not Present- Allergies, Chronic Cough, Coughing up blood, Shortness of breath at rest and Shortness of breath with exertion. Cardiovascular Not Present- Chest Pain, Difficulty Breathing Lying Down, Murmur, Palpitations, Racing/skipping heartbeats and Swelling. Gastrointestinal Not Present- Abdominal Pain, Bloody Stool, Constipation, Diarrhea, Difficulty Swallowing, Heartburn, Jaundice, Loss of appetitie, Nausea and Vomiting. Female Genitourinary Not Present- Blood in Urine, Discharge, Flank Pain, Incontinence, Painful Urination, Urgency, Urinary frequency, Urinary Retention, Urinating at Night and Weak urinary stream. Musculoskeletal Present- Joint Pain and Morning Stiffness. Not Present- Back Pain, Joint Swelling, Muscle Pain, Muscle Weakness and Spasms. Neurological Not Present- Blackout spells, Difficulty with balance, Dizziness, Paralysis, Tremor and Weakness. Psychiatric Not Present- Insomnia.  Vitals  Weight: 180 lb Height: 68.5in Body Surface Area: 1.96 m Body Mass Index: 26.97 kg/m  Pulse: 64 (Regular)  BP: 116/68 (Sitting, Left Arm,  Standard)   Physical Exam  General Mental Status -Alert, cooperative and good historian. General Appearance-pleasant, Not in acute distress. Orientation-Oriented X3. Build & Nutrition-Well nourished and Well developed.  Head and Neck Head-normocephalic, atraumatic . Neck Global Assessment - supple, no bruit auscultated on the right, no bruit auscultated on the left.  Eye Vision-Wears corrective lenses(mostly reading). Pupil - Bilateral-Regular and Round. Motion - Bilateral-EOMI.  ENMT Note: bilateral hearing aids   Chest and Lung Exam Auscultation Breath sounds - clear at anterior chest wall and clear at posterior chest wall. Adventitious sounds - No Adventitious sounds.  Cardiovascular Auscultation Rhythm - Regular rate and rhythm. Heart Sounds - S1 WNL and S2 WNL. Murmurs & Other Heart Sounds - Auscultation of the heart reveals - No Murmurs.  Abdomen Palpation/Percussion Tenderness - Abdomen is non-tender to palpation. Rigidity (guarding) - Abdomen is soft. Auscultation Auscultation of the abdomen reveals - Bowel sounds normal.  Female Genitourinary Note: Not done, not pertinent to present illness   Musculoskeletal Note: Ms. Cammer is in no distress. Her left hip has normal motion, no discomfort. Her right hip can be flexed to about 100, no internal rotation, about 20 of external rotation, 20 of abduction.  Radiographs are reviewed AP pelvis and lateral of the right hip and she has got severe bone on bone arthritis of that hip with subchondral cystic changes. I reviewed the lumbar MRI, do not show any kind of surgical lesions in the back.  Assessment & Plan Primary osteoarthritis of right hip (M16.11)  Note:Surgical Plans: Right Total Hip Replacement - Anterior Approach  Disposition: Home with family  PCP: Dr. Consuello Masse Cards: Dr. Domenic Polite - Patient has been seen preoperatively and felt to be stable for surgery.  IV  TXA  Anesthesia Issues: "Works double on me. Doesn't take much."  Signed electronically by Ok Edwards, III PA-C

## 2017-01-06 NOTE — Patient Instructions (Signed)
[place]  01/06/2017   Your procedure is scheduled on: 01/11/2017  Report to [facility] Main  Entrance take [place]  elevators to 3rd floor to  [place] at     [phone] AM.  Call this number if you have problems the morning of surgery [phone]   Remember: ONLY 1 PERSON MAY GO WITH YOU TO SHORT STAY TO GET  READY MORNING OF [place].  Do not eat food or drink liquids :After Midnight.     Take these medicines the morning of surgery with A SIP OF WATER: allegra, flonase if needed, Synthroid               You may not have any metal on your body including hair pins and              piercings  Do not wear jewelry, make-up, lotions, powders or perfumes, deodorant             Do not wear nail polish.  Do not shave  48 hours prior to surgery.              Do not bring valuables to the hospital. [place].  Contacts, dentures or bridgework may not be worn into surgery.  Leave suitcase in the car. After surgery it may be brought to your room.                  Please read over the following fact sheets you were given: _____________________________________________________________________              WHAT IS A BLOOD TRANSFUSION? Blood Transfusion Information  A transfusion is the replacement of blood or some of its parts. Blood is made up of multiple cells which provide different functions.  Red blood cells carry oxygen and are used for blood loss replacement.  White blood cells fight against infection.  Platelets control bleeding.  Plasma helps clot blood.  Other blood products are available for specialized needs, such as hemophilia or other clotting disorders. BEFORE THE TRANSFUSION  Who gives blood for transfusions?   Healthy volunteers who are fully evaluated to make sure their blood is safe. This is blood bank blood. Transfusion therapy is the safest it has ever been in the practice of  medicine. Before blood is taken from a donor, a complete history is taken to make sure that person has no history of diseases nor engages in risky social behavior (examples are intravenous drug use or sexual activity with multiple partners). The donor's travel history is screened to minimize risk of transmitting infections, such as malaria. The donated blood is tested for signs of infectious diseases, such as HIV and hepatitis. The blood is then tested to be sure it is compatible with you in order to minimize the chance of a transfusion reaction. If you or a relative donates blood, this is often done in anticipation of surgery and is not appropriate for emergency situations. It takes many days to process the donated blood. RISKS AND COMPLICATIONS Although transfusion therapy is very safe and saves many lives, the main dangers of transfusion include:   Getting an infectious disease.  Developing a transfusion reaction. This is an allergic reaction to something in the blood you were given. Every precaution is taken to prevent  this. The decision to have a blood transfusion has been considered carefully by your caregiver before blood is given. Blood is not given unless the benefits outweigh the risks. AFTER THE TRANSFUSION  Right after receiving a blood transfusion, you will usually feel much better and more energetic. This is especially true if your red blood cells have gotten low (anemic). The transfusion raises the level of the red blood cells which carry oxygen, and this usually causes an energy increase.  The nurse administering the transfusion will monitor you carefully for complications. HOME CARE INSTRUCTIONS  No special instructions are needed after a transfusion. You may find your energy is better. Speak with your caregiver about any limitations on activity for underlying diseases you may have. SEEK MEDICAL CARE IF:   Your condition is not improving after your transfusion.  You develop  redness or irritation at the intravenous (IV) site. SEEK IMMEDIATE MEDICAL CARE IF:  Any of the following symptoms occur over the next 12 hours:  Shaking chills.  You have a temperature by mouth above 102 F (38.9 C), not controlled by medicine.  Chest, back, or muscle pain.  People around you feel you are not acting correctly or are confused.  Shortness of breath or difficulty breathing.  Dizziness and fainting.  You get a rash or develop hives.  You have a decrease in urine output.  Your urine turns a dark color or changes to pink, red, or brown. Any of the following symptoms occur over the next 10 days:  You have a temperature by mouth above 102 F (38.9 C), not controlled by medicine.  Shortness of breath.  Weakness after normal activity.  The white part of the eye turns yellow (jaundice).  You have a decrease in the amount of urine or are urinating less often.  Your urine turns a dark color or changes to pink, red, or brown. Document Released: 12/02/2000 Document Revised: 02/27/2012 Document Reviewed: 07/21/2008 ExitCare Patient Information 2014 [place].  _______________________________________________________________________  Incentive Spirometer  An incentive spirometer is a tool that can help keep your lungs clear and active. This tool measures how well you are filling your lungs with each breath. Taking long deep breaths may help reverse or decrease the chance of developing breathing (pulmonary) problems (especially infection) following:  A long period of time when you are unable to move or be active. BEFORE THE PROCEDURE   If the spirometer includes an indicator to show your best effort, your nurse or respiratory therapist will set it to a desired goal.  If possible, sit up straight or lean slightly forward. Try not to slouch.  Hold the incentive spirometer in an upright position. INSTRUCTIONS FOR USE  1. Sit on the edge of your bed if possible,  or sit up as far as you can in bed or on a chair. 2. Hold the incentive spirometer in an upright position. 3. Breathe out normally. 4. Place the mouthpiece in your mouth and seal your lips tightly around it. 5. Breathe in slowly and as deeply as possible, raising the piston or the ball toward the top of the column. 6. Hold your breath for 3-5 seconds or for as long as possible. Allow the piston or ball to fall to the bottom of the column. 7. Remove the mouthpiece from your mouth and breathe out normally. 8. Rest for a few seconds and repeat Steps 1 through 7 at least 10 times every 1-2 hours when you are awake. Take your time and take  a few normal breaths between deep breaths. 9. The spirometer may include an indicator to show your best effort. Use the indicator as a goal to work toward during each repetition. 10. After each set of 10 deep breaths, practice coughing to be sure your lungs are clear. If you have an incision (the cut made at the time of surgery), support your incision when coughing by placing a pillow or rolled up towels firmly against it. Once you are able to get out of bed, walk around indoors and cough well. You may stop using the incentive spirometer when instructed by your caregiver.  RISKS AND COMPLICATIONS  Take your time so you do not get dizzy or light-headed.  If you are in pain, you may need to take or ask for pain medication before doing incentive spirometry. It is harder to take a deep breath if you are having pain. AFTER USE  Rest and breathe slowly and easily.  It can be helpful to keep track of a log of your progress. Your caregiver can provide you with a simple table to help with this. If you are using the spirometer at home, follow these instructions: [place] IF:   You are having difficultly using the spirometer.  You have trouble using the spirometer as often as instructed.  Your pain medication is not giving enough relief while using the  spirometer.  You develop fever of 100.5 F (38.1 C) or higher. SEEK IMMEDIATE MEDICAL CARE IF:   You cough up bloody sputum that had not been present before.  You develop fever of 102 F (38.9 C) or greater.  You develop worsening pain at or near the incision site. MAKE SURE YOU:   Understand these instructions.  Will watch your condition.  Will get help right away if you are not doing well or get worse. Document Released: 04/17/2007 Document Revised: 02/27/2012 Document Reviewed: 06/18/2007 ExitCare Patient Information 2014 ExitCare, [place].   ________________________________________________________________________  WHAT IS A BLOOD TRANSFUSION? Blood Transfusion Information  A transfusion is the replacement of blood or some of its parts. Blood is made up of multiple cells which provide different functions.  Red blood cells carry oxygen and are used for blood loss replacement.  White blood cells fight against infection.  Platelets control bleeding.  Plasma helps clot blood.  Other blood products are available for specialized needs, such as hemophilia or other clotting disorders. BEFORE THE TRANSFUSION  Who gives blood for transfusions?   Healthy volunteers who are fully evaluated to make sure their blood is safe. This is blood bank blood. Transfusion therapy is the safest it has ever been in the practice of medicine. Before blood is taken from a donor, a complete history is taken to make sure that person has no history of diseases nor engages in risky social behavior (examples are intravenous drug use or sexual activity with multiple partners). The donor's travel history is screened to minimize risk of transmitting infections, such as malaria. The donated blood is tested for signs of infectious diseases, such as HIV and hepatitis. The blood is then tested to be sure it is compatible with you in order to minimize the chance of a transfusion reaction. If you or a relative  donates blood, this is often done in anticipation of surgery and is not appropriate for emergency situations. It takes many days to process the donated blood. RISKS AND COMPLICATIONS Although transfusion therapy is very safe and saves many lives, the main dangers of transfusion include:  Getting an infectious disease.  Developing a transfusion reaction. This is an allergic reaction to something in the blood you were given. Every precaution is taken to prevent this. The decision to have a blood transfusion has been considered carefully by your caregiver before blood is given. Blood is not given unless the benefits outweigh the risks. AFTER THE TRANSFUSION  Right after receiving a blood transfusion, you will usually feel much better and more energetic. This is especially true if your red blood cells have gotten low (anemic). The transfusion raises the level of the red blood cells which carry oxygen, and this usually causes an energy increase.  The nurse administering the transfusion will monitor you carefully for complications. HOME CARE INSTRUCTIONS  No special instructions are needed after a transfusion. You may find your energy is better. Speak with your caregiver about any limitations on activity for underlying diseases you may have. SEEK MEDICAL CARE IF:   Your condition is not improving after your transfusion.  You develop redness or irritation at the intravenous (IV) site. SEEK IMMEDIATE MEDICAL CARE IF:  Any of the following symptoms occur over the next 12 hours:  Shaking chills.  You have a temperature by mouth above 102 F (38.9 C), not controlled by medicine.  Chest, back, or muscle pain.  People around you feel you are not acting correctly or are confused.  Shortness of breath or difficulty breathing.  Dizziness and fainting.  You get a rash or develop hives.  You have a decrease in urine output.  Your urine turns a dark color or changes to pink, red, or brown. Any  of the following symptoms occur over the next 10 days:  You have a temperature by mouth above 102 F (38.9 C), not controlled by medicine.  Shortness of breath.  Weakness after normal activity.  The white part of the eye turns yellow (jaundice).  You have a decrease in the amount of urine or are urinating less often.  Your urine turns a dark color or changes to pink, red, or brown. Document Released: 12/02/2000 Document Revised: 02/27/2012 Document Reviewed: 07/21/2008 ExitCare Patient Information 2014 [place].  _______________________________________________________________________[facility] - Preparing for Surgery Before surgery, you can play an important role.  Because skin is not sterile, your skin needs to be as free of germs as possible.  You can reduce the number of germs on your skin by washing with CHG (chlorahexidine gluconate) soap before surgery.  CHG is an antiseptic cleaner which kills germs and bonds with the skin to continue killing germs even after washing. Please DO NOT use if you have an allergy to CHG or antibacterial soaps.  If your skin becomes reddened/irritated stop using the CHG and inform your nurse when you arrive at Short Stay. Do not shave (including legs and underarms) for at least 48 hours prior to the first CHG shower.  You may shave your face/neck. Please follow these instructions carefully:  1.  Shower with CHG Soap the night before surgery and the  morning of Surgery.  2.  If you choose to wash your hair, wash your hair first as usual with your  normal  shampoo.  3.  After you shampoo, rinse your hair and body thoroughly to remove the  shampoo.                           4.  Use CHG as you would any other liquid soap.  You can apply  chg directly  to the skin and wash                       Gently with a scrungie or clean washcloth.  5.  Apply the CHG Soap to your body ONLY FROM THE NECK DOWN.   Do not use on face/ open                            Wound or open sores. Avoid contact with eyes, ears mouth and genitals (private parts).                       Wash face,  Genitals (private parts) with your normal soap.             6.  Wash thoroughly, paying special attention to the area where your surgery  will be performed.  7.  Thoroughly rinse your body with warm water from the neck down.  8.  DO NOT shower/wash with your normal soap after using and rinsing off  the CHG Soap.                9.  Pat yourself dry with a clean towel.            10.  Wear clean pajamas.            11.  Place clean sheets on your bed the night of your first shower and do not  sleep with pets. Day of Surgery : Do not apply any lotions/deodorants the morning of surgery.  Please wear clean clothes to the hospital/surgery center.  FAILURE TO FOLLOW THESE INSTRUCTIONS MAY RESULT IN THE CANCELLATION OF YOUR SURGERY PATIENT SIGNATURE_________________________________  NURSE SIGNATURE__________________________________  ________________________________________________________________________

## 2017-01-09 LAB — TYPE AND SCREEN
ABO/RH(D): O NEG
Antibody Screen: POSITIVE
DAT, IgG: NEGATIVE

## 2017-01-10 MED ORDER — VANCOMYCIN HCL 10 G IV SOLR
1500.0000 mg | INTRAVENOUS | Status: AC
  Administered 2017-01-11: 1500 mg via INTRAVENOUS
  Filled 2017-01-10: qty 1500

## 2017-01-11 ENCOUNTER — Encounter (HOSPITAL_COMMUNITY): Payer: Self-pay | Admitting: Anesthesiology

## 2017-01-11 ENCOUNTER — Inpatient Hospital Stay (HOSPITAL_COMMUNITY): Payer: Medicare Other

## 2017-01-11 ENCOUNTER — Inpatient Hospital Stay (HOSPITAL_COMMUNITY): Payer: Medicare Other | Admitting: Anesthesiology

## 2017-01-11 ENCOUNTER — Encounter (HOSPITAL_COMMUNITY)
Admission: RE | Disposition: A | Discharge status: Home Health | Payer: Self-pay | Source: Ambulatory Visit | Attending: Orthopedic Surgery

## 2017-01-11 ENCOUNTER — Inpatient Hospital Stay (HOSPITAL_COMMUNITY)
Admission: RE | Admit: 2017-01-11 | Discharge: 2017-01-15 | DRG: 470 | Disposition: A | Discharge status: Home Health | Payer: Medicare Other | Source: Ambulatory Visit | Attending: Orthopedic Surgery | Admitting: Orthopedic Surgery

## 2017-01-11 DIAGNOSIS — K449 Diaphragmatic hernia without obstruction or gangrene: Secondary | ICD-10-CM | POA: Diagnosis not present

## 2017-01-11 DIAGNOSIS — M169 Osteoarthritis of hip, unspecified: Secondary | ICD-10-CM

## 2017-01-11 DIAGNOSIS — R55 Syncope and collapse: Secondary | ICD-10-CM

## 2017-01-11 DIAGNOSIS — R0789 Other chest pain: Secondary | ICD-10-CM | POA: Diagnosis not present

## 2017-01-11 DIAGNOSIS — D696 Thrombocytopenia, unspecified: Secondary | ICD-10-CM | POA: Diagnosis present

## 2017-01-11 DIAGNOSIS — R079 Chest pain, unspecified: Secondary | ICD-10-CM | POA: Diagnosis not present

## 2017-01-11 DIAGNOSIS — G629 Polyneuropathy, unspecified: Secondary | ICD-10-CM | POA: Diagnosis present

## 2017-01-11 DIAGNOSIS — R001 Bradycardia, unspecified: Secondary | ICD-10-CM | POA: Diagnosis not present

## 2017-01-11 DIAGNOSIS — I491 Atrial premature depolarization: Secondary | ICD-10-CM

## 2017-01-11 DIAGNOSIS — Z471 Aftercare following joint replacement surgery: Secondary | ICD-10-CM | POA: Diagnosis not present

## 2017-01-11 DIAGNOSIS — I272 Pulmonary hypertension, unspecified: Secondary | ICD-10-CM | POA: Diagnosis not present

## 2017-01-11 DIAGNOSIS — I493 Ventricular premature depolarization: Secondary | ICD-10-CM | POA: Diagnosis not present

## 2017-01-11 DIAGNOSIS — I081 Rheumatic disorders of both mitral and tricuspid valves: Secondary | ICD-10-CM | POA: Diagnosis present

## 2017-01-11 DIAGNOSIS — R0602 Shortness of breath: Secondary | ICD-10-CM | POA: Diagnosis not present

## 2017-01-11 DIAGNOSIS — Z96641 Presence of right artificial hip joint: Secondary | ICD-10-CM | POA: Diagnosis not present

## 2017-01-11 DIAGNOSIS — M25551 Pain in right hip: Secondary | ICD-10-CM | POA: Diagnosis not present

## 2017-01-11 DIAGNOSIS — M1611 Unilateral primary osteoarthritis, right hip: Principal | ICD-10-CM | POA: Diagnosis present

## 2017-01-11 DIAGNOSIS — I5032 Chronic diastolic (congestive) heart failure: Secondary | ICD-10-CM | POA: Diagnosis present

## 2017-01-11 DIAGNOSIS — K219 Gastro-esophageal reflux disease without esophagitis: Secondary | ICD-10-CM | POA: Diagnosis present

## 2017-01-11 DIAGNOSIS — Z96649 Presence of unspecified artificial hip joint: Secondary | ICD-10-CM

## 2017-01-11 DIAGNOSIS — R072 Precordial pain: Secondary | ICD-10-CM | POA: Diagnosis not present

## 2017-01-11 DIAGNOSIS — Z91041 Radiographic dye allergy status: Secondary | ICD-10-CM | POA: Diagnosis not present

## 2017-01-11 DIAGNOSIS — I340 Nonrheumatic mitral (valve) insufficiency: Secondary | ICD-10-CM | POA: Diagnosis not present

## 2017-01-11 HISTORY — PX: TOTAL HIP ARTHROPLASTY: SHX124

## 2017-01-11 SURGERY — ARTHROPLASTY, HIP, TOTAL, ANTERIOR APPROACH
Anesthesia: Spinal | Site: Hip | Laterality: Right

## 2017-01-11 MED ORDER — RIVAROXABAN 10 MG PO TABS
10.0000 mg | ORAL_TABLET | Freq: Every day | ORAL | Status: DC
  Administered 2017-01-12 – 2017-01-15 (×4): 10 mg via ORAL
  Filled 2017-01-11 (×4): qty 1

## 2017-01-11 MED ORDER — PROPOFOL 10 MG/ML IV BOLUS
INTRAVENOUS | Status: DC | PRN
  Administered 2017-01-11: 20 mg via INTRAVENOUS

## 2017-01-11 MED ORDER — PHENOL 1.4 % MT LIQD
1.0000 | OROMUCOSAL | Status: DC | PRN
  Filled 2017-01-11: qty 177

## 2017-01-11 MED ORDER — BUPIVACAINE HCL (PF) 0.25 % IJ SOLN
INTRAMUSCULAR | Status: AC
  Filled 2017-01-11: qty 30

## 2017-01-11 MED ORDER — PANTOPRAZOLE SODIUM 40 MG PO TBEC
80.0000 mg | DELAYED_RELEASE_TABLET | Freq: Every evening | ORAL | Status: DC
  Administered 2017-01-11: 80 mg via ORAL
  Filled 2017-01-11: qty 2

## 2017-01-11 MED ORDER — TRANEXAMIC ACID 1000 MG/10ML IV SOLN
1000.0000 mg | Freq: Once | INTRAVENOUS | Status: AC
  Administered 2017-01-11: 1000 mg via INTRAVENOUS
  Filled 2017-01-11: qty 1100

## 2017-01-11 MED ORDER — POLYVINYL ALCOHOL 1.4 % OP SOLN
1.0000 [drp] | OPHTHALMIC | Status: DC | PRN
  Administered 2017-01-11: 1 [drp] via OPHTHALMIC
  Filled 2017-01-11: qty 15

## 2017-01-11 MED ORDER — PHENYLEPHRINE HCL 10 MG/ML IJ SOLN
INTRAMUSCULAR | Status: DC | PRN
  Administered 2017-01-11: 15 ug/min via INTRAVENOUS

## 2017-01-11 MED ORDER — NITROFURANTOIN MACROCRYSTAL 50 MG PO CAPS
50.0000 mg | ORAL_CAPSULE | Freq: Every day | ORAL | Status: DC
  Administered 2017-01-11 – 2017-01-14 (×4): 50 mg via ORAL
  Filled 2017-01-11 (×4): qty 1

## 2017-01-11 MED ORDER — DIPHENHYDRAMINE HCL 12.5 MG/5ML PO ELIX: 12.5000 mg | ORAL_SOLUTION | ORAL | Status: DC | PRN

## 2017-01-11 MED ORDER — ACETAMINOPHEN 10 MG/ML IV SOLN
1000.0000 mg | Freq: Once | INTRAVENOUS | Status: AC
  Administered 2017-01-11: 1000 mg via INTRAVENOUS

## 2017-01-11 MED ORDER — BUPIVACAINE IN DEXTROSE 0.75-8.25 % IT SOLN
INTRATHECAL | Status: DC | PRN
  Administered 2017-01-11: 1.7 mL via INTRATHECAL

## 2017-01-11 MED ORDER — DOCUSATE SODIUM 100 MG PO CAPS
100.0000 mg | ORAL_CAPSULE | Freq: Two times a day (BID) | ORAL | Status: DC
  Administered 2017-01-12 – 2017-01-15 (×7): 100 mg via ORAL
  Filled 2017-01-11 (×8): qty 1

## 2017-01-11 MED ORDER — MEPERIDINE HCL 50 MG/ML IJ SOLN: 6.2500 mg | INTRAMUSCULAR | Status: DC | PRN

## 2017-01-11 MED ORDER — ONDANSETRON HCL 4 MG/2ML IJ SOLN
4.0000 mg | Freq: Once | INTRAMUSCULAR | Status: DC | PRN
Start: 2017-01-11 — End: 2017-01-11

## 2017-01-11 MED ORDER — ONDANSETRON HCL 4 MG PO TABS: 4.0000 mg | ORAL_TABLET | Freq: Four times a day (QID) | ORAL | Status: DC | PRN

## 2017-01-11 MED ORDER — CHLORHEXIDINE GLUCONATE 4 % EX LIQD: 60.0000 mL | Freq: Once | CUTANEOUS | Status: DC

## 2017-01-11 MED ORDER — ACETAMINOPHEN 500 MG PO TABS
1000.0000 mg | ORAL_TABLET | Freq: Four times a day (QID) | ORAL | Status: AC
  Administered 2017-01-11 – 2017-01-12 (×4): 1000 mg via ORAL
  Filled 2017-01-11 (×4): qty 2

## 2017-01-11 MED ORDER — FLUTICASONE PROPIONATE 50 MCG/ACT NA SUSP
2.0000 | NASAL | Status: DC | PRN
  Filled 2017-01-11: qty 16

## 2017-01-11 MED ORDER — FENTANYL CITRATE (PF) 100 MCG/2ML IJ SOLN
INTRAMUSCULAR | Status: AC
  Filled 2017-01-11: qty 2

## 2017-01-11 MED ORDER — SODIUM CHLORIDE 0.9 % IR SOLN
Status: DC | PRN
  Administered 2017-01-11: 1000 mL

## 2017-01-11 MED ORDER — FENTANYL CITRATE (PF) 100 MCG/2ML IJ SOLN
INTRAMUSCULAR | Status: DC | PRN
  Administered 2017-01-11: 75 ug via INTRAVENOUS
  Administered 2017-01-11: 25 ug via INTRAVENOUS

## 2017-01-11 MED ORDER — DEXAMETHASONE SODIUM PHOSPHATE 10 MG/ML IJ SOLN
10.0000 mg | Freq: Once | INTRAMUSCULAR | Status: AC
  Administered 2017-01-11: 10 mg via INTRAVENOUS

## 2017-01-11 MED ORDER — MIRABEGRON ER 50 MG PO TB24
50.0000 mg | ORAL_TABLET | Freq: Every day | ORAL | Status: DC
  Administered 2017-01-11 – 2017-01-14 (×4): 50 mg via ORAL
  Filled 2017-01-11 (×4): qty 1

## 2017-01-11 MED ORDER — ACETAMINOPHEN 650 MG RE SUPP: 650.0000 mg | Freq: Four times a day (QID) | RECTAL | Status: DC | PRN

## 2017-01-11 MED ORDER — HYDROMORPHONE HCL 1 MG/ML IJ SOLN: 0.5000 mg | INTRAMUSCULAR | Status: DC | PRN

## 2017-01-11 MED ORDER — METOCLOPRAMIDE HCL 5 MG PO TABS
5.0000 mg | ORAL_TABLET | Freq: Three times a day (TID) | ORAL | Status: DC | PRN
Start: 2017-01-11 — End: 2017-01-15

## 2017-01-11 MED ORDER — BUPIVACAINE HCL (PF) 0.25 % IJ SOLN
INTRAMUSCULAR | Status: DC | PRN
  Administered 2017-01-11: 30 mL

## 2017-01-11 MED ORDER — SODIUM CHLORIDE 0.9 % IV SOLN
INTRAVENOUS | Status: DC
  Administered 2017-01-12: 01:00:00 via INTRAVENOUS

## 2017-01-11 MED ORDER — HYDROMORPHONE HCL 2 MG PO TABS
2.0000 mg | ORAL_TABLET | ORAL | Status: DC | PRN
  Administered 2017-01-13: 2 mg via ORAL
  Filled 2017-01-11: qty 1

## 2017-01-11 MED ORDER — PROPOFOL 10 MG/ML IV BOLUS
INTRAVENOUS | Status: AC
  Filled 2017-01-11: qty 20

## 2017-01-11 MED ORDER — FUROSEMIDE 20 MG PO TABS
20.0000 mg | ORAL_TABLET | ORAL | Status: DC
Start: 2017-01-12 — End: 2017-01-15
  Administered 2017-01-12 – 2017-01-14 (×3): 20 mg via ORAL
  Filled 2017-01-11 (×3): qty 1

## 2017-01-11 MED ORDER — NITROGLYCERIN 0.4 MG SL SUBL: 0.4000 mg | SUBLINGUAL_TABLET | SUBLINGUAL | Status: DC | PRN

## 2017-01-11 MED ORDER — VANCOMYCIN HCL IN DEXTROSE 1-5 GM/200ML-% IV SOLN
1000.0000 mg | Freq: Two times a day (BID) | INTRAVENOUS | Status: AC
  Administered 2017-01-12: 1000 mg via INTRAVENOUS
  Filled 2017-01-11: qty 200

## 2017-01-11 MED ORDER — LORATADINE 10 MG PO TABS
10.0000 mg | ORAL_TABLET | Freq: Every day | ORAL | Status: DC
  Administered 2017-01-12 – 2017-01-15 (×4): 10 mg via ORAL
  Filled 2017-01-11 (×4): qty 1

## 2017-01-11 MED ORDER — LACTATED RINGERS IV SOLN
INTRAVENOUS | Status: DC
  Administered 2017-01-11 (×3): via INTRAVENOUS

## 2017-01-11 MED ORDER — ACETAMINOPHEN 10 MG/ML IV SOLN
INTRAVENOUS | Status: AC
  Filled 2017-01-11: qty 100

## 2017-01-11 MED ORDER — FENTANYL CITRATE (PF) 100 MCG/2ML IJ SOLN
25.0000 ug | INTRAMUSCULAR | Status: DC | PRN
  Administered 2017-01-11: 25 ug via INTRAVENOUS

## 2017-01-11 MED ORDER — POLYETHYLENE GLYCOL 3350 17 G PO PACK: 17.0000 g | PACK | Freq: Every day | ORAL | Status: DC | PRN

## 2017-01-11 MED ORDER — ACETAMINOPHEN 325 MG PO TABS
650.0000 mg | ORAL_TABLET | Freq: Four times a day (QID) | ORAL | Status: DC | PRN
  Administered 2017-01-13 – 2017-01-14 (×2): 650 mg via ORAL
  Filled 2017-01-11 (×2): qty 2

## 2017-01-11 MED ORDER — ONDANSETRON HCL 4 MG/2ML IJ SOLN
4.0000 mg | Freq: Four times a day (QID) | INTRAMUSCULAR | Status: DC | PRN
Start: 2017-01-11 — End: 2017-01-15

## 2017-01-11 MED ORDER — TOPIRAMATE 25 MG PO TABS
150.0000 mg | ORAL_TABLET | Freq: Every day | ORAL | Status: DC
  Administered 2017-01-11 – 2017-01-14 (×4): 150 mg via ORAL
  Filled 2017-01-11 (×4): qty 2

## 2017-01-11 MED ORDER — MONTELUKAST SODIUM 10 MG PO TABS
10.0000 mg | ORAL_TABLET | Freq: Every day | ORAL | Status: DC
  Administered 2017-01-11 – 2017-01-14 (×4): 10 mg via ORAL
  Filled 2017-01-11 (×4): qty 1

## 2017-01-11 MED ORDER — FLEET ENEMA 7-19 GM/118ML RE ENEM: 1.0000 | ENEMA | Freq: Once | RECTAL | Status: DC | PRN

## 2017-01-11 MED ORDER — TRAMADOL HCL 50 MG PO TABS
50.0000 mg | ORAL_TABLET | Freq: Four times a day (QID) | ORAL | Status: DC | PRN
  Administered 2017-01-12 – 2017-01-15 (×4): 100 mg via ORAL
  Filled 2017-01-11 (×5): qty 2

## 2017-01-11 MED ORDER — LEVOTHYROXINE SODIUM 25 MCG PO TABS
75.0000 ug | ORAL_TABLET | Freq: Every day | ORAL | Status: DC
  Administered 2017-01-12 – 2017-01-15 (×4): 75 ug via ORAL
  Filled 2017-01-11 (×4): qty 1

## 2017-01-11 MED ORDER — FLUDROCORTISONE ACETATE 0.1 MG PO TABS
100.0000 ug | ORAL_TABLET | ORAL | Status: DC
  Administered 2017-01-11 – 2017-01-13 (×2): 100 ug via ORAL
  Filled 2017-01-11 (×2): qty 1

## 2017-01-11 MED ORDER — TRANEXAMIC ACID 1000 MG/10ML IV SOLN
1000.0000 mg | INTRAVENOUS | Status: AC
  Administered 2017-01-11: 1000 mg via INTRAVENOUS
  Filled 2017-01-11: qty 1100

## 2017-01-11 MED ORDER — METHOCARBAMOL 1000 MG/10ML IJ SOLN
500.0000 mg | Freq: Four times a day (QID) | INTRAVENOUS | Status: DC | PRN
  Administered 2017-01-11: 500 mg via INTRAVENOUS
  Filled 2017-01-11 (×2): qty 5

## 2017-01-11 MED ORDER — BISACODYL 10 MG RE SUPP: 10.0000 mg | Freq: Every day | RECTAL | Status: DC | PRN

## 2017-01-11 MED ORDER — EPHEDRINE SULFATE-NACL 50-0.9 MG/10ML-% IV SOSY
PREFILLED_SYRINGE | INTRAVENOUS | Status: DC | PRN
  Administered 2017-01-11: 5 mg via INTRAVENOUS
  Administered 2017-01-11: 10 mg via INTRAVENOUS
  Administered 2017-01-11: 5 mg via INTRAVENOUS

## 2017-01-11 MED ORDER — DEXAMETHASONE SODIUM PHOSPHATE 10 MG/ML IJ SOLN: 10.0000 mg | Freq: Once | INTRAMUSCULAR | Status: DC

## 2017-01-11 MED ORDER — MENTHOL 3 MG MT LOZG: 1.0000 | LOZENGE | OROMUCOSAL | Status: DC | PRN

## 2017-01-11 MED ORDER — TOPIRAMATE 100 MG PO TABS
100.0000 mg | ORAL_TABLET | Freq: Every day | ORAL | Status: DC
  Administered 2017-01-12 – 2017-01-15 (×4): 100 mg via ORAL
  Filled 2017-01-11 (×4): qty 1

## 2017-01-11 MED ORDER — METOCLOPRAMIDE HCL 5 MG/ML IJ SOLN: 5.0000 mg | Freq: Three times a day (TID) | INTRAMUSCULAR | Status: DC | PRN

## 2017-01-11 MED ORDER — METHOCARBAMOL 500 MG PO TABS
500.0000 mg | ORAL_TABLET | Freq: Four times a day (QID) | ORAL | Status: DC | PRN
  Administered 2017-01-13: 500 mg via ORAL
  Filled 2017-01-11: qty 1

## 2017-01-11 MED ORDER — ONDANSETRON HCL 4 MG/2ML IJ SOLN
INTRAMUSCULAR | Status: DC | PRN
  Administered 2017-01-11: 4 mg via INTRAVENOUS

## 2017-01-11 MED ORDER — PROPOFOL 500 MG/50ML IV EMUL
INTRAVENOUS | Status: DC | PRN
  Administered 2017-01-11: 75 ug/kg/min via INTRAVENOUS

## 2017-01-11 SURGICAL SUPPLY — 30 items
BAG SPEC THK2 15X12 ZIP CLS (MISCELLANEOUS)
BAG ZIPLOCK 12X15 (MISCELLANEOUS) IMPLANT
BLADE SAG 18X100X1.27 (BLADE) ×2 IMPLANT
CAPT HIP TOTAL 2 ×1 IMPLANT
CLOTH BEACON ORANGE TIMEOUT ST (SAFETY) ×2 IMPLANT
COVER PERINEAL POST (MISCELLANEOUS) ×2 IMPLANT
DRAPE STERI IOBAN 125X83 (DRAPES) ×2 IMPLANT
DRAPE U-SHAPE 47X51 STRL (DRAPES) ×4 IMPLANT
DRSG ADAPTIC 3X8 NADH LF (GAUZE/BANDAGES/DRESSINGS) ×2 IMPLANT
DRSG MEPILEX BORDER 4X4 (GAUZE/BANDAGES/DRESSINGS) ×2 IMPLANT
DRSG MEPILEX BORDER 4X8 (GAUZE/BANDAGES/DRESSINGS) ×2 IMPLANT
DURAPREP 26ML APPLICATOR (WOUND CARE) ×2 IMPLANT
ELECT REM PT RETURN 9FT ADLT (ELECTROSURGICAL) ×2
ELECTRODE REM PT RTRN 9FT ADLT (ELECTROSURGICAL) ×1 IMPLANT
EVACUATOR 1/8 PVC DRAIN (DRAIN) ×2 IMPLANT
GLOVE BIO SURGEON STRL SZ7.5 (GLOVE) ×2 IMPLANT
GLOVE BIO SURGEON STRL SZ8 (GLOVE) ×3 IMPLANT
GLOVE BIOGEL PI IND STRL 8 (GLOVE) ×2 IMPLANT
GLOVE BIOGEL PI INDICATOR 8 (GLOVE) ×2
GOWN STRL REUS W/TWL LRG LVL3 (GOWN DISPOSABLE) ×2 IMPLANT
GOWN STRL REUS W/TWL XL LVL3 (GOWN DISPOSABLE) ×2 IMPLANT
PACK ANTERIOR HIP CUSTOM (KITS) ×2 IMPLANT
STRIP CLOSURE SKIN 1/2X4 (GAUZE/BANDAGES/DRESSINGS) ×2 IMPLANT
SUT ETHIBOND NAB CT1 #1 30IN (SUTURE) ×2 IMPLANT
SUT MNCRL AB 4-0 PS2 18 (SUTURE) ×2 IMPLANT
SUT VIC AB 2-0 CT1 27 (SUTURE) ×6
SUT VIC AB 2-0 CT1 TAPERPNT 27 (SUTURE) ×2 IMPLANT
SUT VLOC 180 0 24IN GS25 (SUTURE) ×2 IMPLANT
TRAY FOLEY W/METER SILVER 14FR (SET/KITS/TRAYS/PACK) ×1 IMPLANT
YANKAUER SUCT BULB TIP 10FT TU (MISCELLANEOUS) ×2 IMPLANT

## 2017-01-11 NOTE — Anesthesia Procedure Notes (Signed)
Spinal  Patient location during procedure: OR Start time: 01/11/2017 11:30 AM End time: 01/11/2017 11:37 AM Staffing Resident/CRNA: Nefi Musich Performed: resident/CRNA  Preanesthetic Checklist Completed: patient identified, site marked, surgical consent, pre-op evaluation, timeout performed, IV checked, risks and benefits discussed and monitors and equipment checked Spinal Block Patient position: sitting Prep: ChloraPrep Patient monitoring: heart rate, cardiac monitor, continuous pulse ox and blood pressure Approach: midline Location: L3-4 Injection technique: single-shot Needle Needle type: Pencan  Needle gauge: 24 G Needle length: 9 cm Needle insertion depth: 7 cm Assessment Sensory level: T6 Additional Notes -heme, -para, VSS.  Lot and exp date ok.

## 2017-01-11 NOTE — Op Note (Signed)
OPERATIVE REPORT- TOTAL HIP ARTHROPLASTY   PREOPERATIVE DIAGNOSIS: Osteoarthritis of the Right hip.   POSTOPERATIVE DIAGNOSIS: Osteoarthritis of the Right  hip.   PROCEDURE: Right total hip arthroplasty, anterior approach.   SURGEON: Gaynelle Arabian, MD   ASSISTANT: Arlee Muslim, PA-C  ANESTHESIA:  Spinal  ESTIMATED BLOOD LOSS:-300 ml    DRAINS: Hemovac x1.   COMPLICATIONS: None   CONDITION: PACU - hemodynamically stable.   BRIEF CLINICAL NOTE: [name] is a 83 y.o. female who has advanced end-  stage arthritis of their Right  hip with progressively worsening pain and  dysfunction.The patient has failed nonoperative management and presents for  total hip arthroplasty.   PROCEDURE IN DETAIL: After successful administration of spinal  anesthetic, the traction boots for the [facility] bed were placed on both  feet and the patient was placed onto the [facility] bed, boots placed into the leg  holders. The Right hip was then isolated from the perineum with plastic  drapes and prepped and draped in the usual sterile fashion. ASIS and  greater trochanter were marked and a oblique incision was made, starting  at about 1 cm lateral and 2 cm distal to the ASIS and coursing towards  the anterior cortex of the femur. The skin was cut with a 10 blade  through subcutaneous tissue to the level of the fascia overlying the  tensor fascia lata muscle. The fascia was then incised in line with the  incision at the junction of the anterior third and posterior 2/3rd. The  muscle was teased off the fascia and then the interval between the TFL  and the rectus was developed. The Hohmann retractor was then placed at  the top of the femoral neck over the capsule. The vessels overlying the  capsule were cauterized and the fat on top of the capsule was removed.  A Hohmann retractor was then placed anterior underneath the rectus  femoris to give exposure to the entire anterior capsule. A T-shaped   capsulotomy was performed. The edges were tagged and the femoral head  was identified.       Osteophytes are removed off the superior acetabulum.  The femoral neck was then cut in situ with an oscillating saw. Traction  was then applied to the left lower extremity utilizing the [facility]  traction. The femoral head was then removed. Retractors were placed  around the acetabulum and then circumferential removal of the labrum was  performed. Osteophytes were also removed. Reaming starts at 45 mm to  medialize and  Increased in 2 mm increments to 51 mm. We reamed in  approximately 40 degrees of abduction, 20 degrees anteversion. A 52 mm  pinnacle acetabular shell was then impacted in anatomic position under  fluoroscopic guidance with excellent purchase. We did not need to place  any additional dome screws. A 32 mm neutral + 4 marathon liner was then  placed into the acetabular shell.       The femoral lift was then placed along the lateral aspect of the femur  just distal to the vastus ridge. The leg was  externally rotated and capsule  was stripped off the inferior aspect of the femoral neck down to the  level of the lesser trochanter, this was done with electrocautery. The femur was lifted after this was performed. The  leg was then placed in an extended and adducted position essentially delivering the femur. We also removed the capsule superiorly and the piriformis from the piriformis  fossa to gain excellent exposure of the  proximal femur. Rongeur was used to remove some cancellous bone to get  into the lateral portion of the proximal femur for placement of the  initial starter reamer. The starter broaches was placed  the starter broach  and was shown to go down the center of the canal. Broaching  with the  [place] system was then performed starting at size 8, coursing  Up to size 13. A size 13 had excellent torsional and rotational  and axial stability. The trial standard offset neck was then  placed  with a 32 + 5 trial head. The hip was then reduced. We confirmed that  the stem was in the canal both on AP and lateral x-rays. It also has excellent sizing. The hip was reduced with outstanding stability through full extension and full external rotation.. AP pelvis was taken and the leg lengths were measured and found to be equal. Hip was then dislocated again and the femoral head and neck removed. The  femoral broach was removed. Size 13 [place] stem with a standard offset  neck was then impacted into the femur following native anteversion. Has  excellent purchase in the canal. Excellent torsional and rotational and  axial stability. It is confirmed to be in the canal on AP and lateral  fluoroscopic views. The 32 + 5 ceramic head was placed and the hip  reduced with outstanding stability. Again AP pelvis was taken and it  confirmed that the leg lengths were equal. The wound was then copiously  irrigated with saline solution and the capsule reattached and repaired  with Ethibond suture. 30 ml of .25% Bupivicaine was  injected into the capsule and into the edge of the tensor fascia lata as well as subcutaneous tissue. The fascia overlying the tensor fascia lata was then closed with a running #1 V-Loc. Subcu was closed with interrupted 2-0 Vicryl and subcuticular running 4-0 Monocryl. Incision was cleaned  and dried. Steri-Strips and a bulky sterile dressing applied. Hemovac  drain was hooked to suction and then the patient was awakened and transported to  recovery in stable condition.        Please note that a surgical assistant was a medical necessity for this procedure to perform it in a safe and expeditious manner. Assistant was necessary to provide appropriate retraction of vital neurovascular structures and to prevent femoral fracture and allow for anatomic placement of the prosthesis.  Gaynelle Arabian, M.D.

## 2017-01-11 NOTE — Anesthesia Preprocedure Evaluation (Addendum)
Anesthesia Evaluation  Patient identified by MRN, date of birth, ID band Patient awake    Reviewed: Allergy & Precautions, NPO status , Patient's Chart, lab work & pertinent test results  History of Anesthesia Complications (+) PROLONGED EMERGENCE and history of anesthetic complications  Airway Mallampati: II  TM Distance: >3 FB Neck ROM: Full    Dental no notable dental hx. (+) Teeth Intact   Pulmonary shortness of breath and with exertion,    Pulmonary exam normal breath sounds clear to auscultation       Cardiovascular negative cardio ROS Normal cardiovascular exam Rhythm:Regular Rate:Normal     Neuro/Psych  Headaches, Seizures -, Well Controlled,  Peripheral neuropathy  Neuromuscular disease negative psych ROS   GI/Hepatic Neg liver ROS, GERD  Medicated and Controlled,  Endo/Other  negative endocrine ROS  Renal/GU negative Renal ROS  negative genitourinary   Musculoskeletal  (+) Arthritis , Osteoarthritis,  OA right hip   Abdominal   Peds  Hematology Thrombocytopenia- mild   Anesthesia Other Findings   Reproductive/Obstetrics                            Anesthesia Physical Anesthesia Plan  ASA: II  Anesthesia Plan: Spinal   Post-op Pain Management:    Induction:   Airway Management Planned: Natural Airway and Nasal Cannula  Additional Equipment:   Intra-op Plan:   Post-operative Plan:   Informed Consent: I have reviewed the patients History and Physical, chart, labs and discussed the procedure including the risks, benefits and alternatives for the proposed anesthesia with the patient or authorized representative who has indicated his/her understanding and acceptance.   Dental advisory given  Plan Discussed with: Anesthesiologist, CRNA and Surgeon  Anesthesia Plan Comments:         Anesthesia Quick Evaluation

## 2017-01-11 NOTE — Interval H&P Note (Signed)
History and Physical Interval Note:  01/11/2017 11:20 AM  [name]  has presented today for surgery, with the diagnosis of RIGHT HIP OSTEOARTHRITIS  The various methods of treatment have been discussed with the patient and family. After consideration of risks, benefits and other options for treatment, the patient has consented to  Procedure(s): RIGHT TOTAL HIP ARTHROPLASTY ANTERIOR APPROACH (Right) as a surgical intervention .  The patient's history has been reviewed, patient examined, no change in status, stable for surgery.  I have reviewed the patient's chart and labs.  Questions were answered to the patient's satisfaction.     Gearlean Alf

## 2017-01-11 NOTE — Anesthesia Postprocedure Evaluation (Addendum)
Anesthesia Post Note  Patient: YOVANNA TYLICKI  Procedure(s) Performed: Procedure(s) (LRB): RIGHT TOTAL HIP ARTHROPLASTY ANTERIOR APPROACH (Right)  Patient location during evaluation: PACU Anesthesia Type: Spinal Level of consciousness: oriented and awake and alert Pain management: pain level controlled Vital Signs Assessment: post-procedure vital signs reviewed and stable Respiratory status: spontaneous breathing, respiratory function stable and nonlabored ventilation Cardiovascular status: blood pressure returned to baseline and stable Postop Assessment: no headache, no backache, spinal receding, no signs of nausea or vomiting and patient able to bend at knees Anesthetic complications: no       Last Vitals:  Vitals:   01/11/17 1345 01/11/17 1400  BP: 128/62 132/63  Pulse: (!) 35 75  Resp: 15 15  Temp:      Last Pain:  Vitals:   01/11/17 1400  TempSrc:   PainSc: 0-No pain                 Rawleigh Rode A.

## 2017-01-11 NOTE — H&P (View-Only) (Signed)
[name] DOB: 12-May-1930 Married / Language: English / Race: White Female Date of Admission:  01/11/2017  CC:  Right hip pain History of Present Illness  The patient is a 83 year old female who comes in for a preoperative History and Physical. The patient is scheduled for a right total hip arthroplasty (anterior) to be performed by Dr. Dione Plover. Aluisio, MD at [facility] on 01-11-2017. The patient is a 83 year old female who presented for follow up of their back. The patient is being followed for osteoarthritis. They are now 6 month(s) (1/2) out from when symptoms began. Symptoms reported today include: pain. The patient states that they are doing poorly. The following medication has been used for pain control: none. The patient reports their current pain level to be moderate to severe. [name] was seen for her right hip pain. She did have the lumbar MRI and it just showed some mild stenosis. Did not have anything operative. She did have a degenerative scoliosis. More of her pain however is in her right hip. It is in the groin and lateral hip. It does radiate down to her thigh. It is definitely limiting what she can and cannot do. Radiographs are reviewed AP pelvis and lateral of the right hip and she has got severe bone on bone arthritis of that hip with subchondral cystic changes. At this point, the most predictable means of improving pain and function is total hip arthroplasty. The procedure, risks, potential complications and rehab course are discussed in detail and the patient elects to proceed. They have been treated conservatively in the past for the above stated problem and despite conservative measures, they continue to have progressive pain and severe functional limitations and dysfunction. They have failed non-operative management including home exercise, medications, and injections. It is felt that they would benefit from undergoing total joint replacement. Risks and  benefits of the procedure have been discussed with the patient and they elect to proceed with surgery. There are no active contraindications to surgery such as ongoing infection or rapidly progressive neurological disease.  Problem List/Past Medical Other osteoarthritis of spine, lumbosacral region [id number])  Primary osteoarthritis of right hip (M16.11)  Urinary Incontinence  Diverticulosis  Hiatal Hernia  Bronchitis  Impaired Hearing  Impaired Vision  Migraine Headache  Seizure Disorder  Congestive Heart Failure  Gastroesophageal Reflux Disease  Hypothyroidism  Osteoarthritis  Peripheral Neuropathy  Skin Cancer   Allergies Penicillin G Benzathine *PENICILLINS*  Iodinated Contrast  Codeine Phosphate *ANALGESICS - OPIOID*  Bactrim *ANTI-INFECTIVE AGENTS - MISC.*  Trimethoprim *ANTI-INFECTIVE AGENTS - MISC.*  Morphine Sulfate (Concentrate) *ANALGESICS - OPIOID*  Levaquin *FLUOROQUINOLONES*  Indocin *ANALGESICS - ANTI-INFLAMMATORY*   Family History Cancer  Brother, Sister. Cerebrovascular Accident  Brother, Father, Mother, Sister. Diabetes Mellitus  Mother. Drug / Alcohol Addiction  Brother. Hypertension  Brother, Sister. Rheumatoid Arthritis  Father, Maternal Grandfather, Sister.  Social History Children  4 Current work status  retired Furniture conservator/restorer weekly; does other Living situation  live alone Marital status  widowed Never consumed alcohol  08/24/2016: Never consumed alcohol No history of drug/alcohol rehab  Not under pain contract  Number of flights of stairs before winded  2-3 Tobacco use  Never smoker. 08/24/2016 Advance Directives  Living Will, Healthcare POA  Medication History  Topiramate (50MG  Tablet, Oral) Active. Calcium Citrate (200MG  Tablet, 1 (one) Oral) Active. Vitamin C (500MG  Tablet, 1 (one) Oral) Active. Bioflex (Oral) Active. Fish Oil (1200MG  Capsule, Oral) Active. Omega 3 (1000MG  Capsule,  Oral) Active. Vitamin D (1000UNIT Tablet, Oral) Active. Multiple Vitamin (1 (one) Oral) Active. Fludrocortisone Acetate (0.1MG  Tablet, Oral) Active. Montelukast Sodium (10MG  Tablet, Oral) Active. Nitrofurantoin Macrocrystal (50MG  Capsule, Oral) Active. Aspirin (81MG  Capsule, 1 (one) Oral) Active. Dexilant (60MG  Capsule DR, Oral) Active.  Past Surgical History Appendectomy  Carpal Tunnel Repair  bilateral Cataract Surgery  bilateral Foot Surgery  right Gallbladder Surgery  laporoscopic Hysterectomy  complete (non-cancerous) Spinal Surgery   Review of Systems General Not Present- Chills, Fatigue, Fever, Memory Loss, Night Sweats, Weight Gain and Weight Loss. Skin Not Present- Eczema, Hives, Itching, Lesions and Rash. HEENT Not Present- Dentures, Double Vision, Headache, Hearing Loss, Tinnitus and Visual Loss. Respiratory Not Present- Allergies, Chronic Cough, Coughing up blood, Shortness of breath at rest and Shortness of breath with exertion. Cardiovascular Not Present- Chest Pain, Difficulty Breathing Lying Down, Murmur, Palpitations, Racing/skipping heartbeats and Swelling. Gastrointestinal Not Present- Abdominal Pain, Bloody Stool, Constipation, Diarrhea, Difficulty Swallowing, Heartburn, Jaundice, Loss of appetitie, Nausea and Vomiting. Female Genitourinary Not Present- Blood in Urine, Discharge, Flank Pain, Incontinence, Painful Urination, Urgency, Urinary frequency, Urinary Retention, Urinating at Night and Weak urinary stream. Musculoskeletal Present- Joint Pain and Morning Stiffness. Not Present- Back Pain, Joint Swelling, Muscle Pain, Muscle Weakness and Spasms. Neurological Not Present- Blackout spells, Difficulty with balance, Dizziness, Paralysis, Tremor and Weakness. Psychiatric Not Present- Insomnia.  Vitals  Weight: 180 lb Height: 68.5in Body Surface Area: 1.96 m Body Mass Index: 26.97 kg/m  Pulse: 64 (Regular)  BP: 116/68 (Sitting, Left Arm,  Standard)   Physical Exam  General Mental Status -Alert, cooperative and good historian. General Appearance-pleasant, Not in acute distress. Orientation-Oriented X3. Build & Nutrition-Well nourished and Well developed.  Head and Neck Head-normocephalic, atraumatic . Neck Global Assessment - supple, no bruit auscultated on the right, no bruit auscultated on the left.  Eye Vision-Wears corrective lenses(mostly reading). Pupil - Bilateral-Regular and Round. Motion - Bilateral-EOMI.  ENMT Note: bilateral hearing aids   Chest and Lung Exam Auscultation Breath sounds - clear at anterior chest wall and clear at posterior chest wall. Adventitious sounds - No Adventitious sounds.  Cardiovascular Auscultation Rhythm - Regular rate and rhythm. Heart Sounds - S1 WNL and S2 WNL. Murmurs & Other Heart Sounds - Auscultation of the heart reveals - No Murmurs.  Abdomen Palpation/Percussion Tenderness - Abdomen is non-tender to palpation. Rigidity (guarding) - Abdomen is soft. Auscultation Auscultation of the abdomen reveals - Bowel sounds normal.  Female Genitourinary Note: Not done, not pertinent to present illness   Musculoskeletal Note: [name] is in no distress. Her left hip has normal motion, no discomfort. Her right hip can be flexed to about 100, no internal rotation, about 20 of external rotation, 20 of abduction.  Radiographs are reviewed AP pelvis and lateral of the right hip and she has got severe bone on bone arthritis of that hip with subchondral cystic changes. I reviewed the lumbar MRI, do not show any kind of surgical lesions in the back.  Assessment & Plan Primary osteoarthritis of right hip (M16.11)  Note:Surgical Plans: Right Total Hip Replacement - Anterior Approach  Disposition: Home with family  PCP: Dr. Consuello Masse Cards: Dr. Domenic Polite - Patient has been seen preoperatively and felt to be stable for surgery.  IV  TXA  Anesthesia Issues: "Works double on me. Doesn't take much."  Signed electronically by Ok Edwards, III PA-C

## 2017-01-11 NOTE — Transfer of Care (Signed)
Immediate Anesthesia Transfer of Care Note  Patient: [name]  Procedure(s) Performed: Procedure(s): RIGHT TOTAL HIP ARTHROPLASTY ANTERIOR APPROACH (Right)  Patient Location: PACU  Anesthesia Type:Spinal  Level of Consciousness:  sedated, patient cooperative and responds to stimulation  Airway & Oxygen Therapy:Patient Spontanous Breathing and Patient connected to face mask oxgen  Post-op Assessment:  Report given to PACU RN and Post -op Vital signs reviewed and stable  Post vital signs:  Reviewed and stable  Last Vitals:  Vitals:   01/11/17 0835  BP: 124/67  Pulse: 60  Resp: 18  Temp: 123XX123 C    Complications: No apparent anesthesia complications

## 2017-01-12 ENCOUNTER — Inpatient Hospital Stay (HOSPITAL_COMMUNITY): Payer: Medicare Other

## 2017-01-12 ENCOUNTER — Encounter (HOSPITAL_COMMUNITY): Payer: Self-pay | Admitting: Cardiology

## 2017-01-12 DIAGNOSIS — I491 Atrial premature depolarization: Secondary | ICD-10-CM

## 2017-01-12 DIAGNOSIS — R001 Bradycardia, unspecified: Secondary | ICD-10-CM

## 2017-01-12 DIAGNOSIS — R55 Syncope and collapse: Secondary | ICD-10-CM

## 2017-01-12 DIAGNOSIS — R072 Precordial pain: Secondary | ICD-10-CM

## 2017-01-12 LAB — BASIC METABOLIC PANEL
Anion gap: 5 (ref 5–15)
BUN: 14 mg/dL (ref 6–20)
CHLORIDE: 112 mmol/L — AB (ref 101–111)
CO2: 25 mmol/L (ref 22–32)
CREATININE: 0.61 mg/dL (ref 0.44–1.00)
Calcium: 8.5 mg/dL — ABNORMAL LOW (ref 8.9–10.3)
GFR calc Af Amer: >60 mL/min (ref >60)
GFR calc non Af Amer: >60 mL/min (ref >60)
Glucose, Bld: 119 mg/dL — ABNORMAL HIGH (ref 65–99)
Potassium: 3.5 mmol/L (ref 3.5–5.1)
SODIUM: 142 mmol/L (ref 135–145)

## 2017-01-12 LAB — CBC
HCT: 31.9 % — ABNORMAL LOW (ref 36.0–46.0)
HEMOGLOBIN: 10.6 g/dL — AB (ref 12.0–15.0)
MCH: 28 pg (ref 26.0–34.0)
MCHC: 33.2 g/dL (ref 30.0–36.0)
MCV: 84.2 fL (ref 78.0–100.0)
Platelets: 100 10*3/uL — ABNORMAL LOW (ref 150–400)
RBC: 3.79 MIL/uL — ABNORMAL LOW (ref 3.87–5.11)
RDW: 14.5 % (ref 11.5–15.5)
WBC: 7 10*3/uL (ref 4.0–10.5)

## 2017-01-12 LAB — CK TOTAL AND CKMB (NOT AT ARMC)
CK TOTAL: 106 U/L (ref 38–234)
CK, MB: 2.7 ng/mL (ref 0.5–5.0)
Relative Index: 2.5 (ref 0.0–2.5)

## 2017-01-12 LAB — TROPONIN I: Troponin I: <0.03 ng/mL (ref <0.03)

## 2017-01-12 MED ORDER — DEXLANSOPRAZOLE 60 MG PO CPDR
60.0000 mg | DELAYED_RELEASE_CAPSULE | Freq: Every day | ORAL | Status: DC
  Administered 2017-01-12 – 2017-01-15 (×4): 60 mg via ORAL
  Filled 2017-01-12 (×4): qty 1

## 2017-01-12 MED ORDER — POTASSIUM CHLORIDE CRYS ER 20 MEQ PO TBCR
40.0000 meq | EXTENDED_RELEASE_TABLET | Freq: Once | ORAL | Status: AC
  Administered 2017-01-12: 40 meq via ORAL
  Filled 2017-01-12: qty 2

## 2017-01-12 MED ORDER — GI COCKTAIL ~~LOC~~
30.0000 mL | Freq: Once | ORAL | Status: DC
  Filled 2017-01-12 (×2): qty 30

## 2017-01-12 NOTE — Consult Note (Signed)
Reason for Consult: irregular HR and slow   Referring Physician: Dr. Wynelle Link    PCP:  Manon Hilding, MD  Primary Cardiologist:Dr. Domenic Polite-- seen in [facility] is an 83 y.o. female.    Chief Complaint: pt admitted for total rt. Hip 01/11/17 completed without complications.   HPI:   Asked to see 23 yoF with hx of chronic diastolic HF controlled on low dose lasix, occ ankle edema.  Also hx of neurocardiogenic syncope on florinef.  Last echo 02/2014 with EF 60-65%, no WMA, G2DD, mild to mod. MR, trivial aortic regurg. LA upper limits of normal.  Mod TR, PA pk pressure 49 mmHg.  Myoview done 09/2012 with no ischemia.  Early this AM HR was found to be between 30-110 on pulse ox.  BP 120/50 EKG SR with freq PACs.    She has had episodes of epigastric pain lasting 1-2 min and resolves on own.  No EKG changes to suggest ischemia on EKG.   Troponin and ckmb neg.  Mild anemia K+ 3.5   She is on home meds including florinef.   Currently up on [facility],  She has had 2 episodes of chest pain, Lt diphramatic pain first last night after meds and soup lasted 15 min.  No associated symptoms of SOB or nausea, diaphoresis.  Second episode this AM.  1-2 min.  She states it does not feel like her GI pain.  She does have hiatal hernia.    With slow HR per pulse ox she said she felt bad with headache, runny nose but she thought she could pass out.    Past Medical History:  Diagnosis Date  . Allergy to radiographic dye   . Arthritis   . Chronic diastolic heart failure ([place])   . Complication of anesthesia    Slow to  wake up  . Diverticulosis of colon (without mention of hemorrhage)    TCS by Dr. Gala Romney  . Dyspnea    at times  . GERD (gastroesophageal reflux disease)   . Hemorrhoids, internal    TCS by Dr. Gala Romney  . Irritable bowel syndrome   . Migraine   . Mitral regurgitation   . Peripheral polyneuropathy ([facility])    Confirmed by EMG and nerve conduction velocities   .  Pulmonary nodule   . Seizures ([place])    seizure in 2011  . Syncope    Neurocardiogenic - positive tilt table test, on Florinef  . Tubular adenoma   . UTI (urinary tract infection)    frequent    Past Surgical History:  Procedure Laterality Date  . APPENDECTOMY    . Back Pain     going to chiropracator for back and hip pain  . BACK SURGERY     x2  . bladder tack    . CATARACT EXTRACTION, BILATERAL  2017  . CHOLECYSTECTOMY    . COLONOSCOPY  06/16/2005   internal hemorrhoids, L side diverticula - Dr. Gala Romney  . COLONOSCOPY N/A 05/02/2013   Dr. Gala Romney- normal rectum, scattered L sided diverticula. bx= tubular adenoma  . ESOPHAGOGASTRODUODENOSCOPY  12/21/2006     Dr. Vivi Ferns esophagus/Patulous EG junction, small to moderate sized hiatal hernia,   multiple fundal gland type appearing polyps biopsied, otherwise normal  . FOOT SURGERY     cyst removed  . TOTAL ABDOMINAL HYSTERECTOMY    . TOTAL HIP ARTHROPLASTY Right 01/11/2017   Procedure: RIGHT TOTAL HIP ARTHROPLASTY ANTERIOR APPROACH;  Surgeon:  Gaynelle Arabian, MD;  Location: [facility]S;  Service: Orthopedics;  Laterality: Right;  . WRIST SURGERY      Family History  Problem Relation Age of Onset  . Stroke Father   . Colon cancer Neg Hx    Social History:  reports that she has never smoked. She has never used smokeless tobacco. She reports that she does not drink alcohol or use drugs.  Allergies:  Allergies  Allergen Reactions  . Cephalexin Anaphylaxis and Swelling  . Doxazosin Mesylate Other (See Comments)    Made patient EXTREMELY sick.   . Levofloxacin Anaphylaxis and Rash  . Codeine Other (See Comments)    Hallucinations  . Indomethacin Other (See Comments)    Severe Headache   . Ivp Dye [Iodinated Diagnostic Agents] Hives  . Morphine Other (See Comments)    Hallucinations   . Sulfamethoxazole-Trimethoprim Other (See Comments)    Unknown   . Penicillins Other (See Comments)    Made patient go into shock Has patient  had a PCN reaction causing immediate rash, facial/tongue/throat swelling, SOB or lightheadedness with hypotension: Yes Has patient had a PCN reaction causing severe rash involving mucus membranes or skin necrosis: No Has patient had a PCN reaction that required hospitalization No Has patient had a PCN reaction occurring within the last 10 years: No If all of the above answers are "NO", then may proceed with Cephalosporin use.     OUTPATIENT MEDICATIONS: No current facility-administered medications on file prior to encounter.    Current Outpatient Prescriptions on File Prior to Encounter  Medication Sig Dispense Refill  . aspirin EC 81 MG tablet Take 1 tablet (81 mg total) by mouth daily. (Patient taking differently: Take 81 mg by mouth at bedtime. )    . Cholecalciferol (VITAMIN D-3) 1000 UNITS CAPS Take 1,000 Units by mouth daily.     Marland Kitchen DEXILANT 60 MG capsule TAKE 1 CAPSULE DAILY (Patient taking differently: Take 43ms in the evening) 90 capsule 2  . estradiol (ESTRACE) 0.1 MG/GM vaginal cream Place 4 g vaginally 2 (two) times a week.     . fexofenadine (ALLEGRA) 180 MG tablet Take 180 mg by mouth daily.    . fludrocortisone (FLORINEF) 0.1 MG tablet TAKE 1 TABLET THREE TIMES A WEEK 36 tablet 3  . fluticasone (FLONASE) 50 MCG/ACT nasal spray Place 2 sprays into the nose as needed for allergies.     . furosemide (LASIX) 20 MG tablet Take 1 tablet (20 mg total) by mouth daily. Pt states she does not take it on Sundays and when she has a doctor's appt. (Patient taking differently: Take 20 mg by mouth daily. ) 90 tablet 3  . levothyroxine (SYNTHROID, LEVOTHROID) 75 MCG tablet Take 75 mcg by mouth daily before breakfast.    . mirabegron ER (MYRBETRIQ) 50 MG TB24 tablet Take 50 mg by mouth at bedtime.     . montelukast (SINGULAIR) 10 MG tablet Take 10 mg by mouth at bedtime.     . Multiple Vitamins-Minerals ([employer] SPECTRAVITE SENIOR) TABS Take 1 tablet by mouth daily.      . nitrofurantoin  (MACRODANTIN) 50 MG capsule Take 50 mg by mouth at bedtime.    . Omega-3 Fatty Acids (FISH OIL PO) Take 1 capsule by mouth daily.     . Probiotic Product (ALIGN PO) Take 1 tablet by mouth daily.     . vitamin C (ASCORBIC ACID) 500 MG tablet Take 500 mg by mouth daily.     . Wheat Dextrin (  BENEFIBER DRINK MIX) PACK Take 1 Dose by mouth. 1 to 2 times daily as needed for constipation    . nitroGLYCERIN (NITROSTAT) 0.4 MG SL tablet Place 0.4 mg under the tongue every 5 (five) minutes as needed for chest pain.     CURRENT MEDICATIONS: Scheduled Meds: . acetaminophen  1,000 mg Oral Q6H  . dexamethasone  10 mg Intravenous Once  . dexlansoprazole  60 mg Oral Daily  . docusate sodium  100 mg Oral BID  . fludrocortisone  100 mcg Oral Once per day on Mon Wed Fri  . furosemide  20 mg Oral Once per day on Mon Tue Wed Thu Fri Sat  . gi cocktail  30 mL Oral Once  . levothyroxine  75 mcg Oral QAC breakfast  . loratadine  10 mg Oral Daily  . mirabegron ER  50 mg Oral QHS  . montelukast  10 mg Oral QHS  . nitrofurantoin  50 mg Oral QHS  . rivaroxaban  10 mg Oral Q breakfast  . topiramate  100 mg Oral Daily  . topiramate  150 mg Oral QHS   Continuous Infusions: . sodium chloride 20 mL/hr at 01/12/17 0200   PRN Meds:.acetaminophen **OR** acetaminophen, bisacodyl, diphenhydrAMINE, fluticasone, HYDROmorphone (DILAUDID) injection, HYDROmorphone, menthol-cetylpyridinium **OR** phenol, methocarbamol **OR** methocarbamol (ROBAXIN)  IV, metoCLOPramide **OR** metoCLOPramide (REGLAN) injection, nitroGLYCERIN, ondansetron **OR** ondansetron (ZOFRAN) IV, polyethylene glycol, polyvinyl alcohol, sodium phosphate, traMADol   Results for orders placed or performed during the hospital encounter of 01/11/17 (from the past 48 hour(s))  Type and screen [place]     Status: None (Preliminary result)   Collection Time: 01/11/17  9:00 AM  Result Value Ref Range   ABO/RH(D) O NEG    Antibody Screen  POS    Sample Expiration 01/14/2017    DAT, IgG NEG    PT AG Type NEGATIVE FOR C ANTIGEN NEGATIVE FOR E ANTIGEN    Antibody Identification ANTI D ANTI C    Unit Number [id number]    Blood Component Type RED CELLS,LR    Unit division 00    Status of Unit ALLOCATED    Donor AG Type NEGATIVE FOR C ANTIGEN NEGATIVE FOR E ANTIGEN    Transfusion Status OK TO TRANSFUSE    Crossmatch Result COMPATIBLE   CBC     Status: Abnormal   Collection Time: 01/12/17  4:50 AM  Result Value Ref Range   WBC 7.0 4.0 - 10.5 K/uL   RBC 3.79 (L) 3.87 - 5.11 MIL/uL   Hemoglobin 10.6 (L) 12.0 - 15.0 g/dL   HCT 31.9 (L) 36.0 - 46.0 %   MCV 84.2 78.0 - 100.0 fL   MCH 28.0 26.0 - 34.0 pg   MCHC 33.2 30.0 - 36.0 g/dL   RDW 14.5 11.5 - 15.5 %   Platelets 100 (L) 150 - 400 K/uL    Comment: REPEATED TO VERIFY SPECIMEN CHECKED FOR CLOTS PLATELET COUNT CONFIRMED BY SMEAR   Basic metabolic panel     Status: Abnormal   Collection Time: 01/12/17  4:50 AM  Result Value Ref Range   Sodium 142 135 - 145 mmol/L   Potassium 3.5 3.5 - 5.1 mmol/L   Chloride 112 (H) 101 - 111 mmol/L   CO2 25 22 - 32 mmol/L   Glucose, Bld 119 (H) 65 - 99 mg/dL   BUN 14 6 - 20 mg/dL   Creatinine, Ser 0.61 0.44 - 1.00 mg/dL   Calcium 8.5 (L) 8.9 - 10.3 mg/dL  GFR calc non Af Amer >60 >60 mL/min   GFR calc Af Amer >60 >60 mL/min    Comment: (NOTE) The eGFR has been calculated using the CKD EPI equation. This calculation has not been validated in all clinical situations. eGFR's persistently <60 mL/min signify possible Chronic Kidney Disease.    Anion gap 5 5 - 15  CK total and CKMB (cardiac)not at [facility]     Status: None   Collection Time: 01/12/17  8:37 AM  Result Value Ref Range   Total CK 106 38 - 234 U/L   CK, MB 2.7 0.5 - 5.0 ng/mL   Relative Index 2.5 0.0 - 2.5    Comment: Performed at [facility] [address] [place], [place] [address]  Troponin I     Status: None   Collection Time: 01/12/17  8:37 AM  Result  Value Ref Range   Troponin I <0.03 <0.03 ng/mL   Dg Pelvis Portable  Result Date: 01/11/2017 CLINICAL DATA:  Status post right hip replacement. EXAM: PORTABLE PELVIS 1-2 VIEWS COMPARISON:  None. FINDINGS: Sequelae of recent right total hip arthroplasty are identified. The prosthetic femoral and acetabular components appear normally located on this single AP projection. A drain and postoperative gas are noted in the adjacent soft tissues. No fracture is identified. Left hip joint space width appears preserved. IMPRESSION: Recent right total hip arthroplasty without evidence of acute complication. Electronically Signed   By: Logan Bores M.D.   On: 01/11/2017 14:00   Dg C-arm 1-60 Min-no Report  Result Date: 01/11/2017 There is no Radiologist interpretation  for this exam.   ROS: General:no colds or fevers, no weight changes Skin:no rashes or ulcers HEENT:no blurred vision, some congestion last pm CV:see HPI PUL:see HPI GI:no diarrhea constipation or melena, no indigestion GU:no hematuria, no dysuria MS:no joint pain, no claudication Neuro:?near syncope last evening, no lightheadedness Endo:no diabetes, no thyroid disease   Blood pressure (!) 107/57, pulse 70, temperature 98.1 F (36.7 C), temperature source Oral, resp. rate 18, height 5' 8" (1.727 m), weight 179 lb (81.2 kg), SpO2 100 %.  Wt Readings from Last 3 Encounters:  01/11/17 179 lb (81.2 kg)  01/06/17 179 lb 6.4 oz (81.4 kg)  11/28/16 182 lb (82.6 kg)    PE: General:Pleasant affect, NAD Skin:Warm and dry, brisk capillary refill HEENT:normocephalic, sclera clear, mucus membranes moist Neck:supple, no JVD, no bruits  Heart:S1S2 RRR with premature beats without murmur, gallup, rub or click Lungs:clear without rales, rhonchi, or wheezes QJJ:HERD, non tender, + BS, do not palpate liver spleen or masses Ext:no lower ext edema, 1+ pedal pulses, 2+ radial pulses, rt hip wit dry dressings. Neuro:alert and oriented X 3, MAE,  follows commands, + facial symmetry    Assessment/Plan Principal Problem:   OA (osteoarthritis) of hip   1. HR irregular on pulse ox.  On EKGs she has freq PACS, no a fib noted.  HR  Per pulse ox was 30 but with PACs they may not have been picked up.  Pt needs telemetry to evaluate - she tells me she felt terrible at that time.   2. Brief chest pain this AM longer episode last night, to have GI cocktail now with hx of hiatal hernia.   Doubt cardiac but would repeat troponin.   No EKG changes and neg troponin and CKMB  3. 1 day post op total rt hip.  4. Hx diastolic HF chronic stable  5. Neurocardiogenic syncope on florinef.    Mickel Baas  [facility]  Nurse Practitioner Certified [place] Pager [phone] or after 5pm or weekends call [phone] 01/12/2017, 11:34 AM

## 2017-01-12 NOTE — Progress Notes (Signed)
Pt arrived to unit in recliner, stood to place chair alarm with one assist. Requests to stay up in chair. Pt oriented to callbell and environment. POC discussed. VSS. Denies pain at present.

## 2017-01-12 NOTE — Progress Notes (Signed)
Pt's heart rate fluctuating on pulse ox between 30s to 110s. Pt denies shortness of breath or chest pain. Pt is no acute distress. BP 120/50. EKG done. Contacted Rapid Response RN who was unavailable and she told me to contact house supervisor for assistance. Also, notified Dr. Stann Mainland about findings and he stated that he would let Dr. Wynelle Link know and may need to consult cardiology. No new orders given. Will continue to monitor.

## 2017-01-12 NOTE — Evaluation (Signed)
Physical Therapy Evaluation Patient Details Name: [name] MRN: [id number] DOB: [birth year] Today's Date: 01/12/2017   History of Present Illness  Pt s/p R THR  and with hx of peripheral neuropathy.  Pt being seen this am for cardiology consult but cleared to work with PT  Clinical Impression  Pt s/p R THR and presents with functional mobility limitations 2* decreased R LE strength/ROM and post op pain.  Pt should progress to dc home with family assist and HHPT follow up.    Follow Up Recommendations Home health PT    Equipment Recommendations  None recommended by PT    Recommendations for Other Services OT consult     Precautions / Restrictions Precautions Precautions: Fall Restrictions Weight Bearing Restrictions: No Other Position/Activity Restrictions: WBAT      Mobility  Bed Mobility               General bed mobility comments: OOB with nursing  Transfers Overall transfer level: Needs assistance Equipment used: Rolling walker (2 wheeled) Transfers: Sit to/from Stand Sit to Stand: Min assist         General transfer comment: cues for LE management and use of UEs to self assist  Ambulation/Gait Ambulation/Gait assistance: Min assist;+2 safety/equipment (chair follow) Ambulation Distance (Feet): 28 Feet Assistive device: Rolling walker (2 wheeled) Gait Pattern/deviations: Step-to pattern;Decreased step length - right;Decreased step length - left;Shuffle;Trunk flexed Gait velocity: decr Gait velocity interpretation: Below normal speed for age/gender General Gait Details: cues for sequence, posture and position from [employer]            Wheelchair Mobility    Modified Rankin (Stroke Patients Only)       Balance                                             Pertinent Vitals/Pain Pain Assessment: 0-10 Pain Score: 7  Pain Location: R hip Pain Descriptors / Indicators: Aching;Sore Pain Intervention(s): Limited  activity within patient's tolerance;Monitored during session;Premedicated before session;Ice applied    Home Living Family/patient expects to be discharged to:: Private residence Living Arrangements: Alone Available Help at Discharge: Family Type of Home: House Home Access: Stairs to enter   Secretary/administrator of Steps: 1 Home Layout: One level Home Equipment: Environmental consultant - 2 wheels      Prior Function Level of Independence: Independent               Hand Dominance        Extremity/Trunk Assessment   Upper Extremity Assessment Upper Extremity Assessment: Overall WFL for tasks assessed    Lower Extremity Assessment Lower Extremity Assessment: RLE deficits/detail       Communication   Communication: HOH  Cognition Arousal/Alertness: Awake/alert Behavior During Therapy: WFL for tasks assessed/performed Overall Cognitive Status: Within Functional Limits for tasks assessed                      General Comments      Exercises     Assessment/Plan    PT Assessment Patient needs continued PT services  PT Problem List Decreased strength;Decreased range of motion;Decreased activity tolerance;Decreased mobility;Pain          PT Treatment Interventions DME instruction;Gait training;Stair training;Functional mobility training;Therapeutic activities;Therapeutic exercise;Patient/family education    PT Goals (Current goals can be found in the Care Plan section)  Acute Rehab  PT Goals Patient Stated Goal: Regain IND PT Goal Formulation: With patient Time For Goal Achievement: 01/17/17 Potential to Achieve Goals: Good    Frequency 7X/week   Barriers to discharge        Co-evaluation               End of Session Equipment Utilized During Treatment: Gait belt Activity Tolerance: Patient limited by pain (Pt has been refusing all pain meds besides Tylenol) Patient left: in chair;with call bell/phone within reach Nurse Communication: Mobility  status         Time: 1211-1226 PT Time Calculation (min) (ACUTE ONLY): 15 min   Charges:   PT Evaluation $PT Eval Low Complexity: 1 Procedure     PT G Codes:        Bryn Perkin 2017-01-25, 1:11 PM

## 2017-01-12 NOTE — Progress Notes (Signed)
Patient complains of pain under left breast. VSS. D Perkins PA on floor, with orders received.  Bethann Punches RN

## 2017-01-12 NOTE — Care Management Note (Addendum)
Case Management Note  Patient Details  Name: [name] MRN: [id number] Date of Birth: September 11, 1930  Subjective/Objective:                  RIGHT TOTAL HIP ARTHROPLASTY ANTERIOR APPROACH (Right) Action/Plan: Discharge planning Expected Discharge Date:  01/13/17              Expected Discharge Plan:  [place]  In-House Referral:     Discharge planning Services  CM Consult  Post Acute Care Choice:  Home Health Choice offered to:  Patient  DME Arranged:  3-N-1 DME Agency:  [place]:  PT [facility] Agency:  Other - See comment  Status of Service:In progress  If discussed at Long Length of Stay Meetings, dates discussed:    Additional Comments: CM met with pt in room to confirm plan is for [facility] to render HHPT; pt and daughter of pt confirm.  Per Sharee Pimple Lauer's CM note, the HHPT with [facility] has been prearranged; however CM needs to fax facesheet, face to face, orders, OP note, H&P, and DC summary once face to face and orders have been placed to [phone].  CM notified [place] DME rep, Brad to please deliver the 3n1 to room prior to discharge.  CM following for F2F and orders and will need to fax to [facility]. Dellie Catholic, RN 01/12/2017, 10:47 AM

## 2017-01-12 NOTE — Progress Notes (Signed)
Report called to gretchen Rn on [place]

## 2017-01-12 NOTE — Evaluation (Signed)
Occupational Therapy Evaluation Patient Details Name: [name] MRN: [id number] DOB: [birth year] Today's Date: 01/12/2017    History of Present Illness Pt s/p R THR  and with hx of peripheral neuropathy.  Pt being seen this am for cardiology consult but cleared to work with therapy   Clinical Impression   This 83 year old female was admitted for the above sx. She will benefit from continued OT to increase safety and independence with ADLs and bathroom transfers.  Goals are for supervision to min guard level in acute.    Follow Up Recommendations  No OT follow up;Supervision/Assistance - 24 hour    Equipment Recommendations  3 in 1 bedside commode (delivered)    Recommendations for Other Services       Precautions / Restrictions Precautions Precautions: Fall Restrictions Weight Bearing Restrictions: No Other Position/Activity Restrictions: WBAT      Mobility Bed Mobility               General bed mobility comments: OOB with nursing  Transfers Overall transfer level: Needs assistance Equipment used: Rolling walker (2 wheeled) Transfers: Sit to/from Stand Sit to Stand: Min guard         General transfer comment:  min guard from 3:1 commode  cues for LE management and use of UEs to self assist    Balance                                            ADL Overall ADL's : Needs assistance/impaired     Grooming: Set up;Sitting   Upper Body Bathing: Set up;Sitting   Lower Body Bathing: Minimal assistance;Sit to/from stand   Upper Body Dressing : Set up;Sitting   Lower Body Dressing: Moderate assistance;Sit to/from stand   Toilet Transfer: Min guard;Stand-pivot;BSC   Toileting- Architect and Hygiene: Min guard;Sit to/from stand         General ADL Comments: Pt was on commode when OT arrived.  Will educate on AE next visit, if pt is interested.  Lunch arrived, and pt wanted to eat.     Vision     Perception      Praxis      Pertinent Vitals/Pain Pain Assessment: 0-10 Pain Score: 7  Pain Location: R hip Pain Descriptors / Indicators: Aching;Sore Pain Intervention(s): Limited activity within patient's tolerance;Monitored during session;Premedicated before session;Ice applied     Hand Dominance     Extremity/Trunk Assessment Upper Extremity Assessment Upper Extremity Assessment: Overall WFL for tasks assessed   Lower Extremity Assessment Lower Extremity Assessment: RLE deficits/detail       Communication Communication Communication: HOH   Cognition Arousal/Alertness: Awake/alert Behavior During Therapy: WFL for tasks assessed/performed Overall Cognitive Status: Within Functional Limits for tasks assessed                     General Comments       Exercises       Shoulder Instructions      Home Living Family/patient expects to be discharged to:: Private residence Living Arrangements: Alone Available Help at Discharge: Family Type of Home: House Home Access: Stairs to enter Secretary/administrator of Steps: 1   Home Layout: One level               Home Equipment: Walker - 2 wheels   Additional Comments: pt has a standard toilet and  walk in shower.  A 3:1 was delivered to room      Prior Functioning/Environment Level of Independence: Independent                 OT Problem List: Decreased strength;Decreased activity tolerance;Decreased knowledge of use of DME or AE;Pain   OT Treatment/Interventions: Self-care/ADL training;DME and/or AE instruction;Patient/family education    OT Goals(Current goals can be found in the care plan section) Acute Rehab OT Goals Patient Stated Goal: Regain IND OT Goal Formulation: With patient Time For Goal Achievement: 01/19/17 Potential to Achieve Goals: Good ADL Goals Pt Will Perform Grooming: with supervision;standing Pt Will Transfer to Toilet: with min guard assist;ambulating;bedside commode Pt Will Perform  Tub/Shower Transfer: with min guard assist;Shower transfer;ambulating;3 in 1 Additional ADL Goal #1: pt will verbalize vs demonstrate with supervision, use of AE for adls   OT Frequency: Min 2X/week   Barriers to D/C:            Co-evaluation              End of Session    Activity Tolerance: Patient tolerated treatment well Patient left: in chair;with call bell/phone within reach;with family/visitor present   Time: 3557-3220 OT Time Calculation (min): 12 min Charges:  OT General Charges $OT Visit: 1 Procedure OT Evaluation $OT Eval Low Complexity: 1 Procedure G-Codes:    Ashten Sarnowski 2017-01-21, 1:56 PM Marica Otter, OTR/L [phone] 2017-01-21

## 2017-01-12 NOTE — Progress Notes (Signed)
Physical Therapy Treatment Patient Details Name: [name] MRN: [id number] DOB: 1930/09/24 Today's Date: 01/12/2017    History of Present Illness Pt s/p R THR  and with hx of peripheral neuropathy.  Pt being seen this am for cardiology consult but cleared to work with PT    PT Comments    POD # 1 pm session Pt transferred to telemetry.  On arrival pt in recliner with daughter in room.  Assisted out of recliner to [facility] to void.  Pt required increased assist to rise from lower chair level.  Pt c/o mid sternum pain when using walker.  Noted excessive lean thru B UE's.  Required increased time to complete 1/4 turn.  Pt only amb from [facility] to bed approx 4 feet due to max c/o fatigue.  Pt stated, "it feels like I ran a mile:.  Avg HR during session was 84 and avg RA was 95.  Assisted back to bed per pt request to "nap".   Follow Up Recommendations  Home health PT     Equipment Recommendations  None recommended by PT    Recommendations for Other Services OT consult     Precautions / Restrictions Precautions Precautions: Fall Restrictions Weight Bearing Restrictions: No Other Position/Activity Restrictions: WBAT    Mobility  Bed Mobility Overal bed mobility: Needs Assistance Bed Mobility: Sit to Supine       Sit to supine: Mod assist;Max assist   General bed mobility comments: assisted back to bed required increased assist B LE's up onto bed  Transfers Overall transfer level: Needs assistance Equipment used: Rolling walker (2 wheeled) Transfers: Sit to/from Stand Sit to Stand: Min assist         General transfer comment: increased assist this afternoon with increased c/o fatigue.  "I feel like I ran a mile".  Increased effort off lower level recliner surface than elevated BSC.   Ambulation/Gait Ambulation/Gait assistance: Min assist Ambulation Distance (Feet): 2 Feet Assistive device: Rolling walker (2 wheeled) Gait Pattern/deviations: Step-to pattern;Decreased  stance time - right;Trunk flexed Gait velocity: decr Gait velocity interpretation: Below normal speed for age/gender General Gait Details: cues for sequence, posture and position from RW     poor forward posture       only amb a few feet from [facility] to bed due to fatigue   avg HR was 84 and RA 95%   Stairs            Wheelchair Mobility    Modified Rankin (Stroke Patients Only)       Balance                                    Cognition Arousal/Alertness: Awake/alert Behavior During Therapy: WFL for tasks assessed/performed Overall Cognitive Status: Within Functional Limits for tasks assessed                      Exercises      General Comments        Pertinent Vitals/Pain Pain Assessment: Faces Pain Score: 7  Faces Pain Scale: Hurts little more Pain Location: sternum during standing with excessive WBing B UE's on walker Pain Descriptors / Indicators: Sharp Pain Intervention(s): Monitored during session    Home Living Family/patient expects to be discharged to:: Private residence Living Arrangements: Alone Available Help at Discharge: Family Type of Home: House Home Access: Stairs to enter   Home Layout: One  level Home Equipment: Walker - 2 wheels Additional Comments: pt has a standard toilet and walk in shower.  A 3:1 was delivered to room    Prior Function Level of Independence: Independent          PT Goals (current goals can now be found in the care plan section) Acute Rehab PT Goals Patient Stated Goal: Regain IND PT Goal Formulation: With patient Time For Goal Achievement: 01/17/17 Potential to Achieve Goals: Good Progress towards PT goals: Progressing toward goals    Frequency    7X/week      PT Plan Current plan remains appropriate    Co-evaluation             End of Session Equipment Utilized During Treatment: Gait belt Activity Tolerance: Patient limited by fatigue Patient left: in bed;with call  bell/phone within reach;with family/visitor present     Time: [id number] PT Time Calculation (min) (ACUTE ONLY): 20 min  Charges:  $Gait Training: 8-22 mins                    G Codes:      Rica Koyanagi  PTA [facility]  Acute  Rehab Pager      [phone]

## 2017-01-12 NOTE — Progress Notes (Addendum)
Subjective: 1 Day Post-Op Procedure(s) (LRB): RIGHT TOTAL HIP ARTHROPLASTY ANTERIOR APPROACH (Right) Patient reports pain as mild.   Patient seen in rounds by Dr. Wynelle Link early this morning on rounds.  Daughter in room at bedside. Patient is well, but has had some minor complaints of pain in the hip, requiring pain medications We will start therapy today.  Plan is to go Home after hospital stay.  Events of last night reviewed with the nursing staff.   Pt's heart rate fluctuating on pulse ox between 30s to 110s. Pt denies shortness of breath or chest pain. Pt is no acute distress. BP 120/50. EKG done. Contacted Rapid Response RN who was unavailable and she told me to contact house supervisor for assistance. Also, notified Dr. Stann Mainland about findings and he stated that he would let Dr. Wynelle Link know and may need to consult cardiology. No new orders given. Will continue to monitor.   Patient resting comfortable now on morning rounds.  Discussed the events with the patient and daughter.  Patient has immediate onset of epigastric pain that lasted about 10 minutes and then resolved.  She denied having any SOB or nausea during the episode.  The pain remained localized to the epigastric region, no radiation to the back or arm or neck or upper chest (substernal or left sided chest). She has a history of hiatal hernia and reflux but normally has burning type pain.  She treated her reflux with Prilosec in the past.  Med list provided in the office has Dexilant at home. At the time of morning rounds, the symptoms have completely resolved.  No complaints at this time.  Will order one set of cardiac enzymes, strict I&O's due to history of CHF, GI cocktail as needed is symptoms return.  EKG has taken last night around 1:40pm and again this morning.  The EKG was printed out as A.Fib however has a lot of artifact on the printout. The follow up EKG a few minutes later showed sinus.  Repeat EKG this morning also showed  NSR.  May have some tachy-brady but rhythm was okay this morning.  Will keep on pulse ox and check labs and CXR.  Objective: Vital signs in last 24 hours: Temp:  [97.4 F (36.3 C)-98.4 F (36.9 C)] 97.4 F (36.3 C) (01/25 0544) Pulse Rate:  [35-115] 73 (01/25 0544) Resp:  [15-21] 16 (01/25 0544) BP: (105-154)/(45-78) 110/52 (01/25 0544) SpO2:  [98 %-100 %] 99 % (01/25 0544) Weight:  [81.2 kg (179 lb)] 81.2 kg (179 lb) (01/24 1545)  Intake/Output from previous day:  Intake/Output Summary (Last 24 hours) at 01/12/17 0730 Last data filed at 01/12/17 0600  Gross per 24 hour  Intake          3403.75 ml  Output             2685 ml  Net           718.75 ml    Intake/Output this shift: No intake/output data recorded.  Labs:  Recent Labs  01/12/17 0450  HGB 10.6*    Recent Labs  01/12/17 0450  WBC 7.0  RBC 3.79*  HCT 31.9*  PLT 100*    Recent Labs  01/12/17 0450  NA 142  K 3.5  CL 112*  CO2 25  BUN 14  CREATININE 0.61  GLUCOSE 119*  CALCIUM 8.5*   No results for input(s): LABPT, INR in the last 72 hours.  EXAM General - Patient is Alert, Appropriate and Oriented Extremity -  Neurovascular intact Sensation intact distally Intact pulses distally Dorsiflexion/Plantar flexion intact Dressing - dressing C/D/I Motor Function - intact, moving foot and toes well on exam.  Hemovac pulled without difficulty.  Past Medical History:  Diagnosis Date  . Allergy to radiographic dye   . Arthritis   . Chronic diastolic heart failure ([place])   . Complication of anesthesia    Slow to  wake up  . Diverticulosis of colon (without mention of hemorrhage)    TCS by Dr. Gala Romney  . Dyspnea    at times  . GERD (gastroesophageal reflux disease)   . Hemorrhoids, internal    TCS by Dr. Gala Romney  . Irritable bowel syndrome   . Migraine   . Mitral regurgitation   . Peripheral polyneuropathy ([facility])    Confirmed by EMG and nerve conduction velocities   . Pulmonary nodule   .  Seizures ([place])    seizure in 2011  . Syncope    Neurocardiogenic - positive tilt table test, on [place]  . Tubular adenoma   . UTI (urinary tract infection)    frequent    Assessment/Plan: 1 Day Post-Op Procedure(s) (LRB): RIGHT TOTAL HIP ARTHROPLASTY ANTERIOR APPROACH (Right) Principal Problem:   OA (osteoarthritis) of hip  Estimated body mass index is 27.22 kg/m as calculated from the following:   Height as of this encounter: 5\' 8"  (1.727 m).   Weight as of this encounter: 81.2 kg (179 lb). Advance diet Up with therapy  Resume Dexilant Check Cardiac enzymes Check CXR Continue continuous pulse ox.  DVT Prophylaxis - Xarelto Weight Bearing As Tolerated right Leg Hemovac Pulled Begin Therapy  Arlee Muslim, PA-C Orthopaedic Surgery 01/12/2017, 7:30 AM   Addendum: Patient experienced another episode of epigastric pain that lasted about a minute or two which did resolve on its own.  When assessed at bedside, found to have a regular irregular rhythm on exam.. EKG repeated again showing a rate of 66 but did confirm a regular irregular rhythm on tracing.  Cardiac Enzymes ordered STAT. Abnormal EKG Place back on continuous pulse ox Monitor for changes  Cardiac Consult will be called. She is a patient of Dr. Domenic Polite and is seen in [place] typically.  Arlee Muslim, PA-C

## 2017-01-13 ENCOUNTER — Inpatient Hospital Stay (HOSPITAL_COMMUNITY): Payer: Medicare Other

## 2017-01-13 DIAGNOSIS — R079 Chest pain, unspecified: Secondary | ICD-10-CM

## 2017-01-13 DIAGNOSIS — R0789 Other chest pain: Secondary | ICD-10-CM

## 2017-01-13 LAB — CBC
HCT: 32.3 % — ABNORMAL LOW (ref 36.0–46.0)
Hemoglobin: 10.6 g/dL — ABNORMAL LOW (ref 12.0–15.0)
MCH: 28 pg (ref 26.0–34.0)
MCHC: 32.8 g/dL (ref 30.0–36.0)
MCV: 85.4 fL (ref 78.0–100.0)
PLATELETS: 97 10*3/uL — AB (ref 150–400)
RBC: 3.78 MIL/uL — ABNORMAL LOW (ref 3.87–5.11)
RDW: 14.7 % (ref 11.5–15.5)
WBC: 6.4 10*3/uL (ref 4.0–10.5)

## 2017-01-13 LAB — MAGNESIUM: MAGNESIUM: 1.6 mg/dL — AB (ref 1.7–2.4)

## 2017-01-13 LAB — BASIC METABOLIC PANEL
Anion gap: 4 — ABNORMAL LOW (ref 5–15)
BUN: 13 mg/dL (ref 6–20)
CO2: 25 mmol/L (ref 22–32)
CREATININE: 0.7 mg/dL (ref 0.44–1.00)
Calcium: 8.6 mg/dL — ABNORMAL LOW (ref 8.9–10.3)
Chloride: 110 mmol/L (ref 101–111)
GFR calc Af Amer: >60 mL/min (ref >60)
Glucose, Bld: 142 mg/dL — ABNORMAL HIGH (ref 65–99)
POTASSIUM: 3.7 mmol/L (ref 3.5–5.1)
Sodium: 139 mmol/L (ref 135–145)

## 2017-01-13 LAB — ECHOCARDIOGRAM COMPLETE
Height: 68 in
Weight: 2864 oz

## 2017-01-13 MED ORDER — POLYVINYL ALCOHOL 1.4 % OP SOLN: 1.0000 [drp] | OPHTHALMIC | 0 refills | Status: DC | PRN

## 2017-01-13 MED ORDER — HYDROMORPHONE HCL 2 MG PO TABS: 2.0000 mg | ORAL_TABLET | ORAL | 0 refills | Status: DC | PRN

## 2017-01-13 MED ORDER — RIVAROXABAN 10 MG PO TABS: 10.0000 mg | ORAL_TABLET | Freq: Every day | ORAL | 0 refills | Status: DC

## 2017-01-13 MED ORDER — TRAMADOL HCL 50 MG PO TABS: 50.0000 mg | ORAL_TABLET | Freq: Four times a day (QID) | ORAL | 0 refills | Status: DC | PRN

## 2017-01-13 MED ORDER — MAGNESIUM SULFATE 2 GM/50ML IV SOLN
2.0000 g | Freq: Once | INTRAVENOUS | Status: AC
  Administered 2017-01-13: 2 g via INTRAVENOUS
  Filled 2017-01-13: qty 50

## 2017-01-13 MED ORDER — METHOCARBAMOL 500 MG PO TABS: 500.0000 mg | ORAL_TABLET | Freq: Four times a day (QID) | ORAL | 0 refills | Status: DC | PRN

## 2017-01-13 NOTE — Progress Notes (Addendum)
PT Cancellation Note  Patient Details Name: [name] MRN: [id number] DOB: 1930/03/30   Cancelled Treatment:     OT in room then Cardiologist then ECHO.  Will attempt to see this afternoon.    Rica Koyanagi  PTA [facility]  Acute  Rehab Pager      [phone]

## 2017-01-13 NOTE — Progress Notes (Signed)
Progress Note  Patient Name: [place] Date of Encounter: 01/13/2017  Primary Cardiologist: Dr. Domenic Polite [facility])  Subjective   Episode of "stinging pain" under her left breast last night which lasted for less than 2 minutes then spontaneously resolved. No repeat episodes. No chest discomfort or palpitations. Ambulated with OT earlier this morning.   Inpatient Medications    Scheduled Meds: . dexamethasone  10 mg Intravenous Once  . dexlansoprazole  60 mg Oral Daily  . docusate sodium  100 mg Oral BID  . fludrocortisone  100 mcg Oral Once per day on Mon Wed Fri  . furosemide  20 mg Oral Once per day on Mon Tue Wed Thu Fri Sat  . gi cocktail  30 mL Oral Once  . levothyroxine  75 mcg Oral QAC breakfast  . loratadine  10 mg Oral Daily  . mirabegron ER  50 mg Oral QHS  . montelukast  10 mg Oral QHS  . nitrofurantoin  50 mg Oral QHS  . rivaroxaban  10 mg Oral Q breakfast  . topiramate  100 mg Oral Daily  . topiramate  150 mg Oral QHS   Continuous Infusions: . sodium chloride Stopped (01/12/17 1319)   PRN Meds: acetaminophen **OR** acetaminophen, bisacodyl, diphenhydrAMINE, fluticasone, HYDROmorphone (DILAUDID) injection, HYDROmorphone, menthol-cetylpyridinium **OR** phenol, methocarbamol **OR** methocarbamol (ROBAXIN)  IV, metoCLOPramide **OR** metoCLOPramide (REGLAN) injection, nitroGLYCERIN, ondansetron **OR** ondansetron (ZOFRAN) IV, polyethylene glycol, polyvinyl alcohol, sodium phosphate, traMADol   Vital Signs    Vitals:   01/12/17 1013 01/12/17 1405 01/12/17 2013 01/13/17 0506  BP: (!) 107/57 109/60 101/65 (!) 118/55  Pulse: 70 72 74 76  Resp: 18 16 18 18   Temp: 98.1 F (36.7 C) 98.1 F (36.7 C) 98.2 F (36.8 C) 98.9 F (37.2 C)  TempSrc: Oral Oral Oral Oral  SpO2: 100% 100% 96% 94%  Weight:      Height:        Intake/Output Summary (Last 24 hours) at 01/13/17 1010 Last data filed at 01/13/17 0837  Gross per 24 hour  Intake              348 ml    Output              800 ml  Net             -452 ml   Filed Weights   01/11/17 0909 01/11/17 1545  Weight: 179 lb (81.2 kg) 179 lb (81.2 kg)    Telemetry    NSR with frequent PAC's and occasional PVC's. HR in 60's - 80's.  - Personally Reviewed  ECG    NSR, PAC's HR 66.  - Personally Reviewed  Physical Exam   General: Well developed, elderly Caucasian female appearing in no acute distress. Head: Normocephalic, atraumatic.  Neck: Supple without bruits, JVD not elevated. Lungs:  Resp regular and unlabored, CTA without wheezing or rales. Heart: Regular with frequent ectopic beats, S1, S2, no S3, S4, or murmur; no rub. Abdomen: Soft, non-tender, non-distended with normoactive bowel sounds. No hepatomegaly. No rebound/guarding. No obvious abdominal masses. Extremities: No clubbing, cyanosis, or edema. Distal pedal pulses are 2+ bilaterally. Neuro: Alert and oriented X 3. Moves all extremities spontaneously. Psych: Normal affect.  Labs    Chemistry Recent Labs Lab 01/06/17 1518 01/12/17 0450 01/13/17 0520  NA 142 142 139  K 3.7 3.5 3.7  CL 109 112* 110  CO2 28 25 25   GLUCOSE 105* 119* 142*  BUN 15 14 13   CREATININE 0.67  0.61 0.70  CALCIUM 9.2 8.5* 8.6*  PROT 6.6  --   --   ALBUMIN 3.9  --   --   AST 22  --   --   ALT 14  --   --   ALKPHOS 57  --   --   BILITOT 0.4  --   --   GFRNONAA >60 >60 >60  GFRAA >60 >60 >60  ANIONGAP 5 5 4*     Hematology Recent Labs Lab 01/06/17 1518 01/12/17 0450 01/13/17 0520  WBC 5.0 7.0 6.4  RBC 4.55 3.79* 3.78*  HGB 12.4 10.6* 10.6*  HCT 38.1 31.9* 32.3*  MCV 83.7 84.2 85.4  MCH 27.3 28.0 28.0  MCHC 32.5 33.2 32.8  RDW 14.2 14.5 14.7  PLT 125* 100* 97*    Cardiac Enzymes Recent Labs Lab 01/12/17 0837 01/12/17 1330  TROPONINI <0.03 <0.03   No results for input(s): TROPIPOC in the last 168 hours.   BNPNo results for input(s): BNP, PROBNP in the last 168 hours.   DDimer No results for input(s): DDIMER in the last  168 hours.   Radiology    Dg Chest 2 View  Result Date: 01/12/2017 CLINICAL DATA:  One day postoperative from total hip joint replacement. The patient is experiencing shortness of breath and weakness. History of CHF, mitral regurgitation. EXAM: CHEST  2 VIEW COMPARISON:  Chest x-ray of Apr 28, 2016 FINDINGS: The lungs are well-expanded. There is no focal infiltrate. There is no pleural effusion. There is a moderate-sized hiatal hernia. The heart and pulmonary vascularity are normal. The mediastinum is normal in width. There is calcification in the wall of the thoracic aorta. The bony thorax exhibits no acute abnormality. IMPRESSION: There is no pneumonia nor evidence of pulmonary edema. Moderate-sized hiatal hernia, stable. Thoracic aortic atherosclerosis. Electronically Signed   By: David  Martinique M.D.   On: 01/12/2017 11:46   Dg Pelvis Portable  Result Date: 01/11/2017 CLINICAL DATA:  Status post right hip replacement. EXAM: PORTABLE PELVIS 1-2 VIEWS COMPARISON:  None. FINDINGS: Sequelae of recent right total hip arthroplasty are identified. The prosthetic femoral and acetabular components appear normally located on this single AP projection. A drain and postoperative gas are noted in the adjacent soft tissues. No fracture is identified. Left hip joint space width appears preserved. IMPRESSION: Recent right total hip arthroplasty without evidence of acute complication. Electronically Signed   By: Logan Bores M.D.   On: 01/11/2017 14:00   Dg C-arm 1-60 Min-no Report  Result Date: 01/11/2017 There is no Radiologist interpretation  for this exam.   Cardiac Studies   Echocardiogram: 02/2014 Study Conclusions  - Left ventricle: The cavity size was normal. Wall thickness was normal. Systolic function was normal. The estimated ejection fraction was in the range of 60% to 65%. Wall motion was normal; there were no regional wall motion abnormalities. Features are consistent with a  pseudonormal left ventricular filling pattern, with concomitant abnormal relaxation and increased filling pressure (grade 2 diastolic dysfunction). - Aortic valve: Trileaflet; mildly calcified leaflets. Trivial regurgitation. - Mitral valve: Mild to moderate regurgitation. - Left atrium: The atrium was at the upper limits of normal in size. Volume index: 31.42ml/m^2 (S). - Right ventricle: The cavity size was mildly dilated. Systolic function was normal. - Right atrium: Central venous pressure: 22mm Hg (est). - Tricuspid valve: Moderate regurgitation. - Pulmonary arteries: PA peak pressure: 27mm Hg (S). - Pericardium, extracardiac: There was no pericardial effusion. Impressions:  - Normal LV wall thickness with  LVEF 123456, grade 2 diastolic dysfunction. Upper normal left atrial chamber size. Mild to moderate mitral regurgitation. MIld RV dilatation with normal contraction. Moderate tricuspid regurgitation with PASP 49 mmHg.  Patient Profile     83 y.o. female w/ PMH of chronic diastolic CHF, neurocardiogenic syncope (on Florinef), moderate mitral and tricuspid regurg, and low-risk NST in 2013 with no ischemia who presented for a R total hip arthroplasty on 01/11/2017. Cardiology consulted on 1/25 as she reported episodes of discomfort along her chest and abdomen.   Assessment & Plan    1. Atypical Chest Pain - developed episodes of chest and abdominal discomfort following her recent surgery, partially relieved with GI cocktail. Recurrent episode last night which lasted for less than 2 minutes then spontaneously resolved. - EKG is without acute ischemic changes and cyclic troponin values have been negative. - echocardiogram is pending (to be performed today) to assess LV function and wall motion. If no significant abnormalities, would not pursue further ischemic evaluation at this time.   2. PAC's/PVC's.  - noted to have frequent PAC's on telemetry.  - Mg 1.6  this AM (will replace). K+ 3.7  3. Chronic Diastolic CHF - EF 123456 by echo in 02/2014. Repeat echo pending - has been restarted on PTA Lasix 20mg  daily.  4. Hypomagnesemia - Mg 1.6. Will replace.  5. Recent R Total Hip Arthroplasty - per Orthopedics.  6. History of Neurocardiogenic Syncope  - on Florinef.   Signed, Erma Heritage , PA-C 10:10 AM 01/13/2017 Pager: [phone]

## 2017-01-13 NOTE — Progress Notes (Signed)
Echocardiogram 2D Echocardiogram has been performed.  [name] 01/13/2017, 12:03 PM

## 2017-01-13 NOTE — Progress Notes (Signed)
Occupational Therapy Treatment Patient Details Name: [name] MRN: [id number] DOB: [birth year] Today's Date: 01/13/2017    History of present illness Pt s/p R THR  and with hx of peripheral neuropathy.  Pt being seen this am for cardiology consult but cleared to work with PT   OT comments  Tolerated session well.  Daughter feels pt is losing strength since being here, but she was able to walk 10 feet to [facility] this session.  HR 85  Follow Up Recommendations  No OT follow up;Supervision/Assistance - 24 hour    Equipment Recommendations  3 in 1 bedside commode    Recommendations for Other Services      Precautions / Restrictions Precautions Precautions: Fall Restrictions Weight Bearing Restrictions: No       Mobility Bed Mobility               General bed mobility comments: supine to sit:  min/mod A. Cues for sequence--pt used rail and semirolled. She is Airline pilot used: Agricultural consultant (2 wheeled)   Sit to Stand: Min assist;Mod assist         General transfer comment: more assistance from 3:1 commode.  Min from elevated bed. Cues for UE/LE placement    Balance                                   ADL                           Toilet Transfer: Minimal assistance;Moderate assistance;Ambulation;BSC;RW   Toileting- Clothing Manipulation and Hygiene: Total assistance;Sit to/from stand         General ADL Comments: ambulated about 10 feet to [facility].  Max cues for sequencing and step size.  Elbow was sore at end of this.  Brought chair up to her.  She has a sock aide, which she got for christmas.  Worked with this from chair level.  Would benefit from reinforcement. Daughter will stay with her and assist as she needs it.  She also has a Sports administrator at home:  educated on uses but did not work with this.  Pt had ice bag leak on her at night:  hesitant to use ice.  Applied one bag and spoke to NT about removing it in 20 minutes.  Also told daughter she could remove it.  Showed her closing it below textured area for less likelihood of leaking, but pt does need to have it removed often      Vision                     Perception     Praxis      Cognition   Behavior During Therapy: [facility] for tasks assessed/performed Overall Cognitive Status: Within Functional Limits for tasks assessed                       Extremity/Trunk Assessment               Exercises     Shoulder Instructions       General Comments      Pertinent Vitals/ Pain       Pain Assessment: Faces Faces Pain Scale: Hurts even more Pain Location: R hip when weight bearing Pain Descriptors / Indicators: Sore Pain Intervention(s): Limited activity within patient's tolerance;Monitored during session;Premedicated before session;Repositioned;Ice  applied  Home Living                                          Prior Functioning/Environment              Frequency  Min 2X/week        Progress Toward Goals  OT Goals(current goals can now be found in the care plan section)  Progress towards OT goals: Progressing toward goals  Acute Rehab OT Goals Patient Stated Goal: Regain IND Time For Goal Achievement: 01/19/17  Plan      Co-evaluation                 End of Session     Activity Tolerance Patient tolerated treatment well   Patient Left in chair;with call bell/phone within reach;with family/visitor present;with chair alarm set   Nurse Communication  (NT to remove ice in 20 minutes)        Time: 8119-1478 OT Time Calculation (min): 39 min  Charges: OT General Charges $OT Visit: 1 Procedure OT Treatments $Self Care/Home Management : 23-37 mins $Therapeutic Activity: 8-22 mins  Iveth Heidemann 01/13/2017, 10:31 AM  Marica Otter, OTR/L [phone] 01/13/2017

## 2017-01-13 NOTE — Progress Notes (Signed)
Subjective: 2 Days Post-Op Procedure(s) (LRB): RIGHT TOTAL HIP ARTHROPLASTY ANTERIOR APPROACH (Right) Patient reports pain as mild.   Patient seen in rounds with Dr. Wynelle Link. Daughter in room at bedside. Still having some pain in the hip. She did get up with therapy yesterday and took a few steps. Denies any further epigastric or chest pain. "No more episodes." Greatly appreciate Cardiology consult.  Patient to receive ECHO today.  Transferred to monitor floor.  Continue with therapy.  Keep today.  Possible home tomorrow if completes workup and everything looks good.  Weekend coverage for Dr. Wynelle Link will be by to evaluate patient tomorrow.  If workup negative and she improves with therapy, then probably home tomorrow or when meets goals. Family very attentive and wants to to care of the patient at home. Patient is well, but has had some minor complaints of pain in the hip, requiring pain medications We will resume therapy today.  Plan is to go Home after hospital stay with daughter.  Objective: Vital signs in last 24 hours: Temp:  [98.1 F (36.7 C)-98.9 F (37.2 C)] 98.9 F (37.2 C) (01/26 0506) Pulse Rate:  [69-76] 76 (01/26 0506) Resp:  [16-18] 18 (01/26 0506) BP: (101-127)/(55-72) 118/55 (01/26 0506) SpO2:  [94 %-100 %] 94 % (01/26 0506)  Intake/Output from previous day:  Intake/Output Summary (Last 24 hours) at 01/13/17 0730 Last data filed at 01/12/17 1353  Gross per 24 hour  Intake              468 ml  Output              875 ml  Net             -407 ml    Intake/Output this shift: No intake/output data recorded.  Labs:  Recent Labs  01/12/17 0450 01/13/17 0520  HGB 10.6* 10.6*    Recent Labs  01/12/17 0450 01/13/17 0520  WBC 7.0 6.4  RBC 3.79* 3.78*  HCT 31.9* 32.3*  PLT 100* 97*    Recent Labs  01/12/17 0450 01/13/17 0520  NA 142 139  K 3.5 3.7  CL 112* 110  CO2 25 25  BUN 14 13  CREATININE 0.61 0.70  GLUCOSE 119* 142*  CALCIUM 8.5* 8.6*    No results for input(s): LABPT, INR in the last 72 hours.  EXAM General - Patient is Alert and Appropriate Extremity - Neurovascular intact Sensation intact distally Dorsiflexion/Plantar flexion intact Dressing - dressing C/D/I, some ecchymosis, no active drainage. Motor Function - intact, moving foot and toes well on exam.   Past Medical History:  Diagnosis Date  . Allergy to radiographic dye   . Arthritis   . Chronic diastolic heart failure ([place])   . Complication of anesthesia    Slow to  wake up  . Diverticulosis of colon (without mention of hemorrhage)    TCS by Dr. Gala Romney  . Dyspnea    at times  . GERD (gastroesophageal reflux disease)   . Hemorrhoids, internal    TCS by Dr. Gala Romney  . Irritable bowel syndrome   . Migraine   . Mitral regurgitation   . Peripheral polyneuropathy ([facility])    Confirmed by EMG and nerve conduction velocities   . Pulmonary nodule   . Seizures ([place])    seizure in 2011  . Syncope    Neurocardiogenic - positive tilt table test, on Florinef  . Tubular adenoma   . UTI (urinary tract infection)    frequent  Assessment/Plan: 2 Days Post-Op Procedure(s) (LRB): RIGHT TOTAL HIP ARTHROPLASTY ANTERIOR APPROACH (Right) Principal Problem:   OA (osteoarthritis) of hip Active Problems:   PAC (premature atrial contraction)   Bradycardia   Neurocardiogenic syncope  Estimated body mass index is 27.22 kg/m as calculated from the following:   Height as of this encounter: 5\' 8"  (1.727 m).   Weight as of this encounter: 81.2 kg (179 lb). Up with therapy Discharge home with home health once improved.  DVT Prophylaxis - Xarelto Weight Bearing As Tolerated right Leg Continue monitoring ECHO pending  HGB 10.6 Troponins negative  Arlee Muslim, PA-C Orthopaedic Surgery 01/13/2017, 7:30 AM

## 2017-01-13 NOTE — Discharge Summary (Signed)
Physician Discharge Summary   Patient ID: [name] MRN: [id number] DOB/AGE: 05-06-1930 83 y.o.  Admit date: 01/11/2017 Discharge date: 01/15/2017  Primary Diagnosis:  Osteoarthritis of the Right hip.   Admission Diagnoses:  Past Medical History:  Diagnosis Date  . Allergy to radiographic dye   . Arthritis   . Chronic diastolic heart failure ([place])   . Complication of anesthesia    Slow to  wake up  . Diverticulosis of colon (without mention of hemorrhage)    TCS by Dr. Gala Romney  . Dyspnea    at times  . GERD (gastroesophageal reflux disease)   . Hemorrhoids, internal    TCS by Dr. Gala Romney  . Irritable bowel syndrome   . Migraine   . Mitral regurgitation   . Peripheral polyneuropathy ([facility])    Confirmed by EMG and nerve conduction velocities   . Pulmonary nodule   . Seizures ([place])    seizure in 2011  . Syncope    Neurocardiogenic - positive tilt table test, on Florinef  . Tubular adenoma   . UTI (urinary tract infection)    frequent   Discharge Diagnoses:   Principal Problem:   OA (osteoarthritis) of hip Active Problems:   PAC (premature atrial contraction)   Bradycardia   Neurocardiogenic syncope  Estimated body mass index is 27.25 kg/m as calculated from the following:   Height as of this encounter: 5' 8"  (1.727 m).   Weight as of this encounter: 81.3 kg (179 lb 3.7 oz).  Procedure(s) (LRB): RIGHT TOTAL HIP ARTHROPLASTY ANTERIOR APPROACH (Right)   Consults: None  HPI: [name] is a 83 y.o. female who has advanced end-  stage arthritis of their Right  hip with progressively worsening pain and  dysfunction.The patient has failed nonoperative management and presents for  total hip arthroplasty.   Laboratory Data: Admission on 01/11/2017, Discharged on 01/15/2017  Component Date Value Ref Range Status  . ABO/RH(D) 01/11/2017 O NEG   Final  . Antibody Screen 01/11/2017 POS   Final  . Sample Expiration 01/11/2017 01/14/2017   Final  . DAT, IgG  01/11/2017 NEG   Final  . PT AG Type 01/11/2017 NEGATIVE FOR C ANTIGEN NEGATIVE FOR E ANTIGEN   Final  . Antibody Identification [id number] ANTI D ANTI C   Final  . Unit Number 01/11/2017 [id number]   Final  . Blood Component Type 01/11/2017 RED CELLS,LR   Final  . Unit division 01/11/2017 00   Final  . Status of Unit 01/11/2017 REL FROM [facility]   Final  . Donor AG Type 01/11/2017 NEGATIVE FOR C ANTIGEN NEGATIVE FOR E ANTIGEN   Final  . Transfusion Status 01/11/2017 OK TO TRANSFUSE   Final  . Crossmatch Result 01/11/2017 COMPATIBLE   Final  . WBC 01/12/2017 7.0  4.0 - 10.5 K/uL Final  . RBC 01/12/2017 3.79* 3.87 - 5.11 MIL/uL Final  . Hemoglobin 01/12/2017 10.6* 12.0 - 15.0 g/dL Final  . HCT 01/12/2017 31.9* 36.0 - 46.0 % Final  . MCV 01/12/2017 84.2  78.0 - 100.0 fL Final  . MCH 01/12/2017 28.0  26.0 - 34.0 pg Final  . MCHC 01/12/2017 33.2  30.0 - 36.0 g/dL Final  . RDW 01/12/2017 14.5  11.5 - 15.5 % Final  . Platelets 01/12/2017 100* 150 - 400 K/uL Final   Comment: REPEATED TO VERIFY SPECIMEN CHECKED FOR CLOTS PLATELET COUNT CONFIRMED BY SMEAR   . Sodium 01/12/2017 142  135 - 145 mmol/L Final  . Potassium 01/12/2017  3.5  3.5 - 5.1 mmol/L Final  . Chloride 01/12/2017 112* 101 - 111 mmol/L Final  . CO2 01/12/2017 25  22 - 32 mmol/L Final  . Glucose, Bld 01/12/2017 119* 65 - 99 mg/dL Final  . BUN 01/12/2017 14  6 - 20 mg/dL Final  . Creatinine, Ser 01/12/2017 0.61  0.44 - 1.00 mg/dL Final  . Calcium 01/12/2017 8.5* 8.9 - 10.3 mg/dL Final  . GFR calc non Af Amer 01/12/2017 >60  >60 mL/min Final  . GFR calc Af Amer 01/12/2017 >60  >60 mL/min Final   Comment: (NOTE) The eGFR has been calculated using the CKD EPI equation. This calculation has not been validated in all clinical situations. eGFR's persistently <60 mL/min signify possible Chronic Kidney Disease.   . Anion gap 01/12/2017 5  5 - 15 Final  . Total CK 01/12/2017 106  38 - 234 U/L Final  . CK, MB 01/12/2017 2.7  0.5 -  5.0 ng/mL Final  . Relative Index 01/12/2017 2.5  0.0 - 2.5 Final  . Troponin I 01/12/2017 <0.03  <0.03 ng/mL Final  . Troponin I 01/12/2017 <0.03  <0.03 ng/mL Final  . WBC 01/13/2017 6.4  4.0 - 10.5 K/uL Final  . RBC 01/13/2017 3.78* 3.87 - 5.11 MIL/uL Final  . Hemoglobin 01/13/2017 10.6* 12.0 - 15.0 g/dL Final  . HCT 01/13/2017 32.3* 36.0 - 46.0 % Final  . MCV 01/13/2017 85.4  78.0 - 100.0 fL Final  . MCH 01/13/2017 28.0  26.0 - 34.0 pg Final  . MCHC 01/13/2017 32.8  30.0 - 36.0 g/dL Final  . RDW 01/13/2017 14.7  11.5 - 15.5 % Final  . Platelets 01/13/2017 97* 150 - 400 K/uL Final  . Sodium 01/13/2017 139  135 - 145 mmol/L Final  . Potassium 01/13/2017 3.7  3.5 - 5.1 mmol/L Final  . Chloride 01/13/2017 110  101 - 111 mmol/L Final  . CO2 01/13/2017 25  22 - 32 mmol/L Final  . Glucose, Bld 01/13/2017 142* 65 - 99 mg/dL Final  . BUN 01/13/2017 13  6 - 20 mg/dL Final  . Creatinine, Ser 01/13/2017 0.70  0.44 - 1.00 mg/dL Final  . Calcium 01/13/2017 8.6* 8.9 - 10.3 mg/dL Final  . GFR calc non Af Amer 01/13/2017 >60  >60 mL/min Final  . GFR calc Af Amer 01/13/2017 >60  >60 mL/min Final   Comment: (NOTE) The eGFR has been calculated using the CKD EPI equation. This calculation has not been validated in all clinical situations. eGFR's persistently <60 mL/min signify possible Chronic Kidney Disease.   . Anion gap 01/13/2017 4* 5 - 15 Final  . Weight 01/13/2017 2864  oz Final  . Height 01/13/2017 68  in Final  . BP 01/13/2017 118/55  mmHg Final  . Magnesium 01/13/2017 1.6* 1.7 - 2.4 mg/dL Final  . WBC 01/14/2017 6.3  4.0 - 10.5 K/uL Final  . RBC 01/14/2017 3.66* 3.87 - 5.11 MIL/uL Final  . Hemoglobin 01/14/2017 10.3* 12.0 - 15.0 g/dL Final  . HCT 01/14/2017 30.9* 36.0 - 46.0 % Final  . MCV 01/14/2017 84.4  78.0 - 100.0 fL Final  . MCH 01/14/2017 28.1  26.0 - 34.0 pg Final  . MCHC 01/14/2017 33.3  30.0 - 36.0 g/dL Final  . RDW 01/14/2017 14.6  11.5 - 15.5 % Final  . Platelets  01/14/2017 102* 150 - 400 K/uL Final  Hospital Outpatient Visit on 01/06/2017  Component Date Value Ref Range Status  . aPTT 01/06/2017 29  24 -  36 seconds Final  . WBC 01/06/2017 5.0  4.0 - 10.5 K/uL Final  . RBC 01/06/2017 4.55  3.87 - 5.11 MIL/uL Final  . Hemoglobin 01/06/2017 12.4  12.0 - 15.0 g/dL Final  . HCT 01/06/2017 38.1  36.0 - 46.0 % Final  . MCV 01/06/2017 83.7  78.0 - 100.0 fL Final  . MCH 01/06/2017 27.3  26.0 - 34.0 pg Final  . MCHC 01/06/2017 32.5  30.0 - 36.0 g/dL Final  . RDW 01/06/2017 14.2  11.5 - 15.5 % Final  . Platelets 01/06/2017 125* 150 - 400 K/uL Final  . Sodium 01/06/2017 142  135 - 145 mmol/L Final  . Potassium 01/06/2017 3.7  3.5 - 5.1 mmol/L Final  . Chloride 01/06/2017 109  101 - 111 mmol/L Final  . CO2 01/06/2017 28  22 - 32 mmol/L Final  . Glucose, Bld 01/06/2017 105* 65 - 99 mg/dL Final  . BUN 01/06/2017 15  6 - 20 mg/dL Final  . Creatinine, Ser 01/06/2017 0.67  0.44 - 1.00 mg/dL Final  . Calcium 01/06/2017 9.2  8.9 - 10.3 mg/dL Final  . Total Protein 01/06/2017 6.6  6.5 - 8.1 g/dL Final  . Albumin 01/06/2017 3.9  3.5 - 5.0 g/dL Final  . AST 01/06/2017 22  15 - 41 U/L Final  . ALT 01/06/2017 14  14 - 54 U/L Final  . Alkaline Phosphatase 01/06/2017 57  38 - 126 U/L Final  . Total Bilirubin 01/06/2017 0.4  0.3 - 1.2 mg/dL Final  . GFR calc non Af Amer 01/06/2017 >60  >60 mL/min Final  . GFR calc Af Amer 01/06/2017 >60  >60 mL/min Final   Comment: (NOTE) The eGFR has been calculated using the CKD EPI equation. This calculation has not been validated in all clinical situations. eGFR's persistently <60 mL/min signify possible Chronic Kidney Disease.   . Anion gap 01/06/2017 5  5 - 15 Final  . Prothrombin Time 01/06/2017 13.3  11.4 - 15.2 seconds Final  . INR 01/06/2017 1.01   Final  . ABO/RH(D) 01/06/2017 O NEG   Final  . Antibody Screen 01/06/2017 POS   Final  . Sample Expiration 01/06/2017 01/09/2017   Final  . Extend sample reason 01/06/2017  NOT A CANDIDATE   Final  . Antibody Identification [id number] NO CLINICALLY SIGNIFICANT ANTIBODY IDENTIFIED   Final  . DAT, IgG 01/06/2017 NEG   Final  . Color, Urine 01/06/2017 YELLOW  YELLOW Final  . APPearance 01/06/2017 HAZY* CLEAR Final  . Specific Gravity, Urine 01/06/2017 1.017  1.005 - 1.030 Final  . pH 01/06/2017 6.0  5.0 - 8.0 Final  . Glucose, UA 01/06/2017 NEGATIVE  NEGATIVE mg/dL Final  . Hgb urine dipstick 01/06/2017 NEGATIVE  NEGATIVE Final  . Bilirubin Urine 01/06/2017 NEGATIVE  NEGATIVE Final  . Ketones, ur 01/06/2017 NEGATIVE  NEGATIVE mg/dL Final  . Protein, ur 01/06/2017 NEGATIVE  NEGATIVE mg/dL Final  . Nitrite 01/06/2017 NEGATIVE  NEGATIVE Final  . Leukocytes, UA 01/06/2017 NEGATIVE  NEGATIVE Final  . MRSA, PCR 01/06/2017 NEGATIVE  NEGATIVE Final  . Staphylococcus aureus 01/06/2017 POSITIVE* NEGATIVE Final   Comment:        The Xpert SA Assay (FDA approved for NASAL specimens in patients over 84 years of age), is one component of a comprehensive surveillance program.  Test performance has been validated by [facility] for patients greater than or equal to 22 year old. It is not intended to diagnose infection nor to guide or monitor treatment.   Marland Kitchen  ABO/RH(D) 01/06/2017 O NEG   Final     X-Rays:Dg Chest 2 View  Result Date: 01/12/2017 CLINICAL DATA:  One day postoperative from total hip joint replacement. The patient is experiencing shortness of breath and weakness. History of CHF, mitral regurgitation. EXAM: CHEST  2 VIEW COMPARISON:  Chest x-ray of Apr 28, 2016 FINDINGS: The lungs are well-expanded. There is no focal infiltrate. There is no pleural effusion. There is a moderate-sized hiatal hernia. The heart and pulmonary vascularity are normal. The mediastinum is normal in width. There is calcification in the wall of the thoracic aorta. The bony thorax exhibits no acute abnormality. IMPRESSION: There is no pneumonia nor evidence of pulmonary edema.  Moderate-sized hiatal hernia, stable. Thoracic aortic atherosclerosis. Electronically Signed   By: David  Martinique M.D.   On: 01/12/2017 11:46   Dg Pelvis Portable  Result Date: 01/11/2017 CLINICAL DATA:  Status post right hip replacement. EXAM: PORTABLE PELVIS 1-2 VIEWS COMPARISON:  None. FINDINGS: Sequelae of recent right total hip arthroplasty are identified. The prosthetic femoral and acetabular components appear normally located on this single AP projection. A drain and postoperative gas are noted in the adjacent soft tissues. No fracture is identified. Left hip joint space width appears preserved. IMPRESSION: Recent right total hip arthroplasty without evidence of acute complication. Electronically Signed   By: Logan Bores M.D.   On: 01/11/2017 14:00   Dg C-arm 1-60 Min-no Report  Result Date: 01/11/2017 There is no Radiologist interpretation  for this exam.   EKG: Orders placed or performed during the hospital encounter of 01/11/17  . EKG 12-Lead  . EKG 12-Lead  . EKG 12-Lead  . EKG 12-Lead  . EKG 12-Lead  . EKG 12-Lead  . EKG 12-Lead  . EKG 12-Lead  . EKG 12-Lead  . EKG 12-Lead     Hospital Course: Patient was admitted to [facility] and taken to the OR and underwent the above state procedure without complications.  Patient tolerated the procedure well and was later transferred to the recovery room and then to the orthopaedic floor for postoperative care.  They were given PO and IV analgesics for pain control following their surgery.  They were given 24 hours of postoperative antibiotics of  Anti-infectives    Start     Dose/Rate Route Frequency Ordered Stop   01/12/17 0030  vancomycin (VANCOCIN) IVPB 1000 mg/200 mL premix     1,000 mg 200 mL/hr over 60 Minutes Intravenous Every 12 hours 01/11/17 1552 01/12/17 0132   01/11/17 0600  vancomycin (VANCOCIN) 1,500 mg in sodium chloride 0.9 % 500 mL IVPB     1,500 mg 250 mL/hr over 120 Minutes Intravenous On call to O.R.  01/10/17 1324 01/11/17 1015     and started on DVT prophylaxis in the form of Xarelto.   PT and OT were ordered for total hip protocol.  The patient was allowed to be WBAT with therapy. Discharge planning was consulted to help with postop disposition and equipment needs.  Patient had a tough night on the evening of surgery.  They started to get up OOB with therapy on day one.  Hemovac drain was pulled without difficulty.   Patient seen in rounds by Dr. Wynelle Link early this morning on rounds.  Daughter in room at bedside. Patient is well, but has had some minor complaints of pain in the hip, requiring pain medications We will start therapy today.  Plan is to go Home after hospital stay. Events of last night reviewed  with the nursing staff.   Pt's heart rate fluctuating on pulse ox between 30s to 110s. Pt denies shortness of breath or chest pain. Pt is no acute distress. BP 120/50. EKG done. Contacted Rapid Response RN who was unavailable and she told me to contact house supervisor for assistance. Also, notified Dr. Stann Mainland about findings and he stated that he would let Dr. Wynelle Link know and may need to consult cardiology. No new orders given. Will continue to monitor.  Patient resting comfortable now on morning rounds.  Discussed the events with the patient and daughter.  Patient has immediate onset of epigastric pain that lasted about 10 minutes and then resolved.  She denied having any SOB or nausea during the episode.  The pain remained localized to the epigastric region, no radiation to the back or arm or neck or upper chest (substernal or left sided chest). She has a history of hiatal hernia and reflux but normally has burning type pain.  She treated her reflux with Prilosec in the past.  Med list provided in the office has Dexilant at home. At the time of morning rounds, the symptoms have completely resolved.  No complaints at this time.  Will order one set of cardiac enzymes, strict I&O's due to history  of CHF, GI cocktail as needed is symptoms return.  EKG has taken last night around 1:40pm and again this morning.  The EKG was printed out as A.Fib however has a lot of artifact on the printout. The follow up EKG a few minutes later showed sinus.  Repeat EKG this morning also showed NSR.  May have some tachy-brady but rhythm was okay this morning.  Will keep on pulse ox and check labs and CXR. Cardiology Consult: History and all data above reviewed.  Patient examined.  I agree with the findings as above.  All available labs, radiology testing, previous records reviewed. Agree with documented assessment and plan. [name] is an 29F with chronic diastolic heart failure, neurocardiogenic syncope on Florinef, moderate mitral and tricuspid regurgitation who developed atypical chest pain and possible bradycardia after R hip replacement.  She reports several episodes of "twinges" in her chest and abdomen.  The worst was yesterday afternoon while sitting.  It has also occurred for shorter durations while laying in the bed and while walking.  She denies palpitations, lightheadedness, dizziness or shortness of breath.  EKG is unremarkable and cardiac enzymes are negative.  I suspect this is not ischemia.  She does have frequent PACs on telemetry.  We will supplement her potassium and check a magnesium level.  We will also get an echo to assess LVEF and wall motion.   If this is normal, plan for outpatient follow up with Dr. Domenic Polite.   Tiffany C. Oval Linsey, MD, [facility]  2 Days Post-Op Procedure(s) (LRB): RIGHT TOTAL HIP ARTHROPLASTY ANTERIOR APPROACH (Right) Patient reports pain as mild.   Patient seen in rounds with Dr. Wynelle Link. Daughter in room at bedside. Still having some pain in the hip. She did get up with therapy yesterday and took a few steps. Denies any further epigastric or chest pain. "No more episodes." Greatly appreciate Cardiology consult.  Patient to receive ECHO today.  Transferred to monitor floor.   Continue with therapy.  Keep today.  Possible home tomorrow if completes workup and everything looks good.  Weekend coverage for Dr. Wynelle Link will be by to evaluate patient tomorrow.  If workup negative and she improves with therapy, then probably home tomorrow or when meets  goals. Family very attentive and wants to to care of the patient at home. Patient is well, but has had some minor complaints of pain in the hip, requiring pain medications We will resume therapy today.  Continued to work with therapy into day two.  Dressing was changed on day two and the incision was healing well.   [name] is an 89F with chronic diastolic heart failure, neurocardiogenic syncope on Florinef, moderate mitral and tricuspid regurgitation who developed atypical chest pain and possible bradycardia after R hip replacement. This morning she is tired after taking pain medication and a muscle relaxant.  She denies chest pain or shortness of breath.  She continues to have PACs on telemetry but no significant arrhythmias.  Potassium and magnesium are being supplemented.  She no longer has chest twinges.  EKG is not concerning for ischemia and cardiac enzymes are negative.  Echo shows normal systolic function and no wall motion abnormalities.  No further cardiac work up at this time.  We will arrange for follow up with Dr. Domenic Polite.  We will sign off.  Please call if there are questions or concerns. Tiffany C. Oval Linsey, MD, [facility]  3 Days Post-Op Procedure(s) (LRB): RIGHT TOTAL HIP ARTHROPLASTY ANTERIOR APPROACH (Right) Patient reports pain as moderate.   Daughter in room with her. States she really hasn't been able to do much yet with PT. Has been somewhat weak and out of it the last 2 days likely due to pain meds. Hgb is stable. Daughter is concerned about her possible D/C with how weak she is and does not feel she is ready yet for D/C.  Pt reports pain requiring narcotic pain meds. 4 Days Post-Op Procedure(s) (LRB): RIGHT  TOTAL HIP ARTHROPLASTY ANTERIOR APPROACH (Right) Patient reports pain as 3 on 0-10 scale.   Denies CP or SOB.   Up with therapy.  DC home after PT. Ambulation Distance (Feet): 115 Feet.   Pt was also able to amb a greater distance.  Pt at a mobility level safe to D/C to home with family.  Diet: Cardiac diet Activity:WBAT Follow-up:in 2 weeks Disposition - Home Discharged Condition: improving   Discharge Instructions    Call MD / Call 911    Complete by:  As directed    If you experience chest pain or shortness of breath, CALL 911 and be transported to the hospital emergency room.  If you develope a fever above 101 F, pus (white drainage) or increased drainage or redness at the wound, or calf pain, call your surgeon's office.   Change dressing    Complete by:  As directed    You may change your dressing dressing daily with sterile 4 x 4 inch gauze dressing and paper tape.  Do not submerge the incision under water.   Constipation Prevention    Complete by:  As directed    Drink plenty of fluids.  Prune juice may be helpful.  You may use a stool softener, such as Colace (over the counter) 100 mg twice a day.  Use MiraLax (over the counter) for constipation as needed.   Diet - low sodium heart healthy    Complete by:  As directed    Discharge instructions    Complete by:  As directed    Pick up stool softner and laxative for home use following surgery while on pain medications. Do not submerge incision under water. Please use good hand washing techniques while changing dressing each day. May shower starting three days after  surgery. Please use a clean towel to pat the incision dry following showers. Continue to use ice for pain and swelling after surgery. Do not use any lotions or creams on the incision until instructed by your surgeon.  Wear both TED hose on both legs during the day every day for three weeks, but may have off at night at home.  Postoperative Constipation  Protocol  Constipation - defined medically as fewer than three stools per week and severe constipation as less than one stool per week.  One of the most common issues patients have following surgery is constipation.  Even if you have a regular bowel pattern at home, your normal regimen is likely to be disrupted due to multiple reasons following surgery.  Combination of anesthesia, postoperative narcotics, change in appetite and fluid intake all can affect your bowels.  In order to avoid complications following surgery, here are some recommendations in order to help you during your recovery period.  Colace (docusate) - Pick up an over-the-counter form of Colace or another stool softener and take twice a day as long as you are requiring postoperative pain medications.  Take with a full glass of water daily.  If you experience loose stools or diarrhea, hold the colace until you stool forms back up.  If your symptoms do not get better within 1 week or if they get worse, check with your doctor.  Dulcolax (bisacodyl) - Pick up over-the-counter and take as directed by the product packaging as needed to assist with the movement of your bowels.  Take with a full glass of water.  Use this product as needed if not relieved by Colace only.   MiraLax (polyethylene glycol) - Pick up over-the-counter to have on hand.  MiraLax is a solution that will increase the amount of water in your bowels to assist with bowel movements.  Take as directed and can mix with a glass of water, juice, soda, coffee, or tea.  Take if you go more than two days without a movement. Do not use MiraLax more than once per day. Call your doctor if you are still constipated or irregular after using this medication for 7 days in a row.  If you continue to have problems with postoperative constipation, please contact the office for further assistance and recommendations.  If you experience "the worst abdominal pain ever" or develop nausea or  vomiting, please contact the office immediatly for further recommendations for treatment.   Take Xarelto for two and a half more weeks, then discontinue Xarelto. Once the patient has completed the Xarelto, they may resume the 81 mg Aspirin.    Do not sit on low chairs, stoools or toilet seats, as it may be difficult to get up from low surfaces    Complete by:  As directed    Driving restrictions    Complete by:  As directed    No driving until released by the physician.   Increase activity slowly as tolerated    Complete by:  As directed    Lifting restrictions    Complete by:  As directed    No lifting until released by the physician.   Patient may shower    Complete by:  As directed    You may shower without a dressing once there is no drainage.  Do not wash over the wound.  If drainage remains, do not shower until drainage stops.   TED hose    Complete by:  As directed  Use stockings (TED hose) for 3 weeks on both leg(s).  You may remove them at night for sleeping.   Weight bearing as tolerated    Complete by:  As directed    Laterality:  right   Extremity:  Lower     Allergies as of 01/15/2017      Reactions   Cephalexin Anaphylaxis, Swelling   Doxazosin Mesylate Other (See Comments)   Made patient EXTREMELY sick.    Levofloxacin Anaphylaxis, Rash   Codeine Other (See Comments)   Hallucinations   Indomethacin Other (See Comments)   Severe Headache    Ivp Dye [iodinated Diagnostic Agents] Hives   Morphine Other (See Comments)   Hallucinations    Sulfamethoxazole-trimethoprim Other (See Comments)   Unknown    Penicillins Other (See Comments)   Made patient go into shock Has patient had a PCN reaction causing immediate rash, facial/tongue/throat swelling, SOB or lightheadedness with hypotension: Yes Has patient had a PCN reaction causing severe rash involving mucus membranes or skin necrosis: No Has patient had a PCN reaction that required hospitalization No Has  patient had a PCN reaction occurring within the last 10 years: No If all of the above answers are "NO", then may proceed with Cephalosporin use.      Medication List    STOP taking these medications   ALIGN PO   aspirin EC 81 MG tablet   [employer] SPECTRAVITE SENIOR Tabs   estradiol 0.1 MG/GM vaginal cream Commonly known as:  ESTRACE   FISH OIL PO   NONFORMULARY OR COMPOUNDED ITEM   OSTEO BI-FLEX TRIPLE STRENGTH Tabs   PRESCRIPTION MEDICATION   vitamin C 500 MG tablet Commonly known as:  ASCORBIC ACID   Vitamin D-3 1000 units Caps     TAKE these medications   BENEFIBER DRINK MIX Pack Take 1 Dose by mouth. 1 to 2 times daily as needed for constipation   DEXILANT 60 MG capsule Generic drug:  dexlansoprazole TAKE 1 CAPSULE DAILY What changed:  See the new instructions.   fexofenadine 180 MG tablet Commonly known as:  ALLEGRA Take 180 mg by mouth daily.   FLONASE 50 MCG/ACT nasal spray Generic drug:  fluticasone Place 2 sprays into the nose as needed for allergies.   fludrocortisone 0.1 MG tablet Commonly known as:  FLORINEF TAKE 1 TABLET THREE TIMES A WEEK   furosemide 20 MG tablet Commonly known as:  LASIX Take 1 tablet (20 mg total) by mouth daily. Pt states she does not take it on Sundays and when she has a doctor's appt. What changed:  additional instructions   HYDROmorphone 2 MG tablet Commonly known as:  DILAUDID Take 1-2 tablets (2-4 mg total) by mouth every 4 (four) hours as needed for moderate pain or severe pain.   levothyroxine 75 MCG tablet Commonly known as:  SYNTHROID, LEVOTHROID Take 75 mcg by mouth daily before breakfast.   methocarbamol 500 MG tablet Commonly known as:  ROBAXIN Take 1 tablet (500 mg total) by mouth every 6 (six) hours as needed for muscle spasms.   montelukast 10 MG tablet Commonly known as:  SINGULAIR Take 10 mg by mouth at bedtime.   MYRBETRIQ 50 MG Tb24 tablet Generic drug:  mirabegron ER Take 50 mg by mouth at  bedtime.   nitrofurantoin 50 MG capsule Commonly known as:  MACRODANTIN Take 50 mg by mouth at bedtime.   nitroGLYCERIN 0.4 MG SL tablet Commonly known as:  NITROSTAT Place 0.4 mg under the tongue every 5 (five)  minutes as needed for chest pain.   polyethylene glycol packet Commonly known as:  MIRALAX / GLYCOLAX Take 17 g by mouth at bedtime as needed for mild constipation.   polyvinyl alcohol 1.4 % ophthalmic solution Commonly known as:  LIQUIFILM TEARS Place 1 drop into both eyes as needed for dry eyes.   rivaroxaban 10 MG Tabs tablet Commonly known as:  XARELTO Take 1 tablet (10 mg total) by mouth daily with breakfast. Take Xarelto for two and a half more weeks following discharge from the hospital, then discontinue Xarelto. Once the patient has completed the Xarelto, they may resume the 81 mg Aspirin.   SYSTANE OP Apply 1 drop to eye 2 (two) times daily.   topiramate 50 MG tablet Commonly known as:  TOPAMAX Take 2 tabs in the morning and 3 tabs in the evening.   traMADol 50 MG tablet Commonly known as:  ULTRAM Take 1-2 tablets (50-100 mg total) by mouth every 6 (six) hours as needed (mild pain).      Follow-up Information    Gearlean Alf, MD. Schedule an appointment as soon as possible for a visit on 01/24/2017.   Specialty:  Orthopedic Surgery Why:  Call office at [phone] to setup follow up on Tuesday 01/24/2017. Contact information: [address]  [place] [address] [phone]        Rozann Lesches, MD Follow up on 02/10/2017.   Specialty:  Cardiology Why:  Cardiology Follow-Up on 02/10/2017 at 4:00PM Contact information: [place] [id number] [facility] Follow up.   Why:  Patient will receive home health services (physical therapy).  Contact information: [address] [place] [address] [place]  [place]  [address] [phone]           Signed: Arlee Muslim, PA-C Orthopaedic Surgery 01/16/2017, 12:15 PM

## 2017-01-13 NOTE — Progress Notes (Signed)
Physical Therapy Treatment Patient Details Name: BURNIECE DISHON MRN: [id number] DOB: 1930-11-18 Today's Date: 01/13/2017    History of Present Illness Pt s/p R THR  and with hx of peripheral neuropathy.  Pt being seen this am for cardiology consult but cleared to work with PT    PT Comments    POD # 2 pm session Assisted pt out of recliner to [facility].  Pt required increased time to initiate mvt and increased time to complete task.  Assisted off BSC to amb to sink to wash hands.  VC's for proper walker advanecment and proper walker to self distance.  Pt amb a great distance in hallway with avg HR 89 and no c/o's other than hip pain.  Pt plans to D/C to home tomorrow.  Advised family, pt will need assist with all ADL's and mobility.  Pt is slow moving with slow corrective response.  Pt demonstrates a slow, forward flex posture, shuffled gait with tendency to push walker too far to front.  HIGH FALL RISK.    Follow Up Recommendations  Home health PT;Supervision/Assistance - 24 hour     Equipment Recommendations  None recommended by PT    Recommendations for Other Services       Precautions / Restrictions Precautions Precautions: Fall Restrictions Weight Bearing Restrictions: No Other Position/Activity Restrictions: WBAT    Mobility  Bed Mobility               General bed mobility comments: OOB in recliner  Transfers Overall transfer level: Needs assistance Equipment used: Rolling walker (2 wheeled) Transfers: Sit to/from [facility] Sit to Stand: Min assist Stand pivot transfers: Min assist       General transfer comment: 50% VC's on proper hand and leg placement.  Required increased, increased time to initiate mvmt.  assisted out of recliner to [facility] then off BSC.  50% VC's on turn completion and hand placement to sit.  Very slow moving.    Ambulation/Gait Ambulation/Gait assistance: Min guard;Min assist Ambulation Distance (Feet): 52 Feet Assistive  device: Rolling walker (2 wheeled) Gait Pattern/deviations: Step-to pattern;Decreased stance time - right;Trunk flexed Gait velocity: decreased x 3   General Gait Details: poor forward flex posture with tight knocked knees (Valgus) and tendency to push walker too far to front.  75% VC's for proper walker to self distance, upright posture and to take "big steps".  Very slow gait with poor self correction to mid line.  HIGH FALL RISK.   Stairs            Wheelchair Mobility    Modified Rankin (Stroke Patients Only)       Balance                                    Cognition Arousal/Alertness: Awake/alert (slow/delayed) Behavior During Therapy: WFL for tasks assessed/performed Overall Cognitive Status: Within Functional Limits for tasks assessed                      Exercises      General Comments        Pertinent Vitals/Pain Pain Assessment: Faces Faces Pain Scale: Hurts a little bit Pain Location: R hip when weight bearing Pain Descriptors / Indicators: Discomfort;Sore Pain Intervention(s): Monitored during session;Repositioned    Home Living  Prior Function            PT Goals (current goals can now be found in the care plan section) Progress towards PT goals: Progressing toward goals    Frequency    7X/week      PT Plan Current plan remains appropriate    Co-evaluation             End of Session Equipment Utilized During Treatment: Gait belt Activity Tolerance: Patient tolerated treatment well Patient left: in chair;with chair alarm set;with call bell/phone within reach     Time: 1425-1450 PT Time Calculation (min) (ACUTE ONLY): 25 min  Charges:  $Gait Training: 8-22 mins $Therapeutic Activity: 8-22 mins                    G Codes:      Rica Koyanagi  PTA [facility]  Acute  Rehab Pager      [phone]

## 2017-01-13 NOTE — Discharge Instructions (Addendum)
° °Dr. Frank Aluisio °Total Joint Specialist °[facility] °[address] °[place], [place] [address] °[phone] ° °ANTERIOR APPROACH TOTAL HIP REPLACEMENT POSTOPERATIVE DIRECTIONS ° ° °Hip Rehabilitation, Guidelines Following Surgery  °The results of a hip operation are greatly improved after range of motion and muscle strengthening exercises. Follow all safety measures which are given to protect your hip. If any of these exercises cause increased pain or swelling in your joint, decrease the amount until you are comfortable again. Then slowly increase the exercises. Call your caregiver if you have problems or questions.  ° °HOME CARE INSTRUCTIONS  °Remove items at home which could result in a fall. This includes throw rugs or furniture in walking pathways.  °· ICE to the affected hip every three hours for 30 minutes at a time and then as needed for pain and swelling.  Continue to use ice on the hip for pain and swelling from surgery. You may notice swelling that will progress down to the foot and ankle.  This is normal after surgery.  Elevate the leg when you are not up walking on it.   °· Continue to use the breathing machine which will help keep your temperature down.  It is common for your temperature to cycle up and down following surgery, especially at night when you are not up moving around and exerting yourself.  The breathing machine keeps your lungs expanded and your temperature down. ° ° °DIET °You may resume your previous home diet once your are discharged from the hospital. ° °DRESSING / WOUND CARE / SHOWERING °You may shower 3 days after surgery, but keep the wounds dry during showering.  You may use an occlusive plastic wrap (Press'n Seal for example), NO SOAKING/SUBMERGING IN THE BATHTUB.  If the bandage gets wet, change with a clean dry gauze.  If the incision gets wet, pat the wound dry with a clean towel. °You may start showering once you are discharged home but do not  submerge the incision under water. Just pat the incision dry and apply a dry gauze dressing on daily. °Change the surgical dressing daily and reapply a dry dressing each time. ° °ACTIVITY °Walk with your walker as instructed. °Use walker as long as suggested by your caregivers. °Avoid periods of inactivity such as sitting longer than an hour when not asleep. This helps prevent blood clots.  °You may resume a sexual relationship in one month or when given the OK by your doctor.  °You may return to work once you are cleared by your doctor.  °Do not drive a car for 6 weeks or until released by you surgeon.  °Do not drive while taking narcotics. ° °WEIGHT BEARING °Weight bearing as tolerated with assist device (walker, cane, etc) as directed, use it as long as suggested by your surgeon or therapist, typically at least 4-6 weeks. ° °POSTOPERATIVE CONSTIPATION PROTOCOL °Constipation - defined medically as fewer than three stools per week and severe constipation as less than one stool per week. ° °One of the most common issues patients have following surgery is constipation.  Even if you have a regular bowel pattern at home, your normal regimen is likely to be disrupted due to multiple reasons following surgery.  Combination of anesthesia, postoperative narcotics, change in appetite and fluid intake all can affect your bowels.  In order to avoid complications following surgery, here are some recommendations in order to help you during your recovery period. ° °Colace (docusate) - Pick up an over-the-counter   form of Colace or another stool softener and take twice a day as long as you are requiring postoperative pain medications.  Take with a full glass of water daily.  If you experience loose stools or diarrhea, hold the colace until you stool forms back up.  If your symptoms do not get better within 1 week or if they get worse, check with your doctor. ° °Dulcolax (bisacodyl) - Pick up over-the-counter and take as directed  by the product packaging as needed to assist with the movement of your bowels.  Take with a full glass of water.  Use this product as needed if not relieved by Colace only.  ° °MiraLax (polyethylene glycol) - Pick up over-the-counter to have on hand.  MiraLax is a solution that will increase the amount of water in your bowels to assist with bowel movements.  Take as directed and can mix with a glass of water, juice, soda, coffee, or tea.  Take if you go more than two days without a movement. °Do not use MiraLax more than once per day. Call your doctor if you are still constipated or irregular after using this medication for 7 days in a row. ° °If you continue to have problems with postoperative constipation, please contact the office for further assistance and recommendations.  If you experience "the worst abdominal pain ever" or develop nausea or vomiting, please contact the office immediatly for further recommendations for treatment. ° °ITCHING ° If you experience itching with your medications, try taking only a single pain pill, or even half a pain pill at a time.  You can also use Benadryl over the counter for itching or also to help with sleep.  ° °TED HOSE STOCKINGS °Wear the elastic stockings on both legs for three weeks following surgery during the day but you may remove then at night for sleeping. ° °MEDICATIONS °See your medication summary on the “After Visit Summary” that the nursing staff will review with you prior to discharge.  You may have some home medications which will be placed on hold until you complete the course of blood thinner medication.  It is important for you to complete the blood thinner medication as prescribed by your surgeon.  Continue your approved medications as instructed at time of discharge. ° °PRECAUTIONS °If you experience chest pain or shortness of breath - call 911 immediately for transfer to the hospital emergency department.  °If you develop a fever greater that 101 F,  purulent drainage from wound, increased redness or drainage from wound, foul odor from the wound/dressing, or calf pain - CONTACT YOUR SURGEON.   °                                                °FOLLOW-UP APPOINTMENTS °Make sure you keep all of your appointments after your operation with your surgeon and caregivers. You should call the office at the above phone number and make an appointment for approximately two weeks after the date of your surgery or on the date instructed by your surgeon outlined in the "After Visit Summary". ° °RANGE OF MOTION AND STRENGTHENING EXERCISES  °These exercises are designed to help you keep full movement of your hip joint. Follow your caregiver's or physical therapist's instructions. Perform all exercises about fifteen times, three times per day or as directed. Exercise both hips, even if you   have had only one joint replacement. These exercises can be done on a training (exercise) mat, on the floor, on a table or on a bed. Use whatever works the best and is most comfortable for you. Use music or television while you are exercising so that the exercises are a pleasant break in your day. This will make your life better with the exercises acting as a break in routine you can look forward to.  Lying on your back, slowly slide your foot toward your buttocks, raising your knee up off the floor. Then slowly slide your foot back down until your leg is straight again.  Lying on your back spread your legs as far apart as you can without causing discomfort.  Lying on your side, raise your upper leg and foot straight up from the floor as far as is comfortable. Slowly lower the leg and repeat.  Lying on your back, tighten up the muscle in the front of your thigh (quadriceps muscles). You can do this by keeping your leg straight and trying to raise your heel off the floor. This helps strengthen the largest muscle supporting your knee.  Lying on your back, tighten up the muscles of your  buttocks both with the legs straight and with the knee bent at a comfortable angle while keeping your heel on the floor.   IF YOU ARE TRANSFERRED TO A SKILLED REHAB FACILITY If the patient is transferred to a skilled rehab facility following release from the hospital, a list of the current medications will be sent to the facility for the patient to continue.  When discharged from the skilled rehab facility, please have the facility set up the patient's [place] prior to being released. Also, the skilled facility will be responsible for providing the patient with their medications at time of release from the facility to include their pain medication, the muscle relaxants, and their blood thinner medication. If the patient is still at the rehab facility at time of the two week follow up appointment, the skilled rehab facility will also need to assist the patient in arranging follow up appointment in our office and any transportation needs.  MAKE SURE YOU:  Understand these instructions.  Get help right away if you are not doing well or get worse.    Pick up stool softner and laxative for home use following surgery while on pain medications. Do not submerge incision under water. Please use good hand washing techniques while changing dressing each day. May shower starting three days after surgery. Please use a clean towel to pat the incision dry following showers. Continue to use ice for pain and swelling after surgery. Do not use any lotions or creams on the incision until instructed by your surgeon.  Take Xarelto for two and a half more weeks following discharge from the hospital, then discontinue Xarelto. Once the patient has completed the Xarelto, they may resume the 81 mg Aspirin.    Information on my medicine - XARELTO (Rivaroxaban)  This medication education was reviewed with me or my healthcare representative as part of my discharge preparation.  The pharmacist that  spoke with me during my hospital stay was:  [facility] Darcell Yacoub, Student-PharmD  Why was Xarelto prescribed for you? Xarelto was prescribed for you to reduce the risk of blood clots forming after orthopedic surgery. The medical term for these abnormal blood clots is venous thromboembolism (VTE).  What do you need to know about xarelto ? Take your Xarelto ONCE DAILY  at the same time every day. You may take it either with or without food.  If you have difficulty swallowing the tablet whole, you may crush it and mix in applesauce just prior to taking your dose.  Take Xarelto exactly as prescribed by your doctor and DO NOT stop taking Xarelto without talking to the doctor who prescribed the medication.  Stopping without other VTE prevention medication to take the place of Xarelto may increase your risk of developing a clot.  After discharge, you should have regular check-up appointments with your healthcare provider that is prescribing your Xarelto.    What do you do if you miss a dose? If you miss a dose, take it as soon as you remember on the same day then continue your regularly scheduled once daily regimen the next day. Do not take two doses of Xarelto on the same day.   Important Safety Information A possible side effect of Xarelto is bleeding. You should call your healthcare provider right away if you experience any of the following: ? Bleeding from an injury or your nose that does not stop. ? Unusual colored urine (red or dark brown) or unusual colored stools (red or black). ? Unusual bruising for unknown reasons. ? A serious fall or if you hit your head (even if there is no bleeding).  Some medicines may interact with Xarelto and might increase your risk of bleeding while on Xarelto. To help avoid this, consult your healthcare provider or pharmacist prior to using any new prescription or non-prescription medications, including herbals, vitamins, non-steroidal anti-inflammatory  drugs (NSAIDs) and supplements.  This website has more information on Xarelto: [email or web address]

## 2017-01-14 LAB — CBC
HCT: 30.9 % — ABNORMAL LOW (ref 36.0–46.0)
Hemoglobin: 10.3 g/dL — ABNORMAL LOW (ref 12.0–15.0)
MCH: 28.1 pg (ref 26.0–34.0)
MCHC: 33.3 g/dL (ref 30.0–36.0)
MCV: 84.4 fL (ref 78.0–100.0)
PLATELETS: 102 10*3/uL — AB (ref 150–400)
RBC: 3.66 MIL/uL — AB (ref 3.87–5.11)
RDW: 14.6 % (ref 11.5–15.5)
WBC: 6.3 10*3/uL (ref 4.0–10.5)

## 2017-01-14 NOTE — Progress Notes (Signed)
Physical Therapy Treatment Note    01/14/17 1600  PT Visit Information  Last PT Received On 01/14/17  Assistance Needed +1  History of Present Illness Pt s/p R THR and with hx of peripheral neuropathy.    Subjective Data  Subjective Pt assisted with ambulating in hallway again.  Also worked on sit to Research scientist (medical).  Daughter educated on providing cues for pt with sit to stands as well.  Pt and family plan for d/c home tomorrow.  Precautions  Precautions Fall  Restrictions  Other Position/Activity Restrictions WBAT  Pain Assessment  Pain Assessment Faces  Faces Pain Scale 2  Pain Location R hip  Pain Descriptors / Indicators Sore  Pain Intervention(s) Monitored during session;Repositioned  Cognition  Arousal/Alertness Awake/alert  Behavior During Therapy WFL for tasks assessed/performed  Overall Cognitive Status Within Functional Limits for tasks assessed  Bed Mobility  Overal bed mobility Needs Assistance  Bed Mobility Supine to Sit  Supine to sit Min assist  General bed mobility comments slight assist for R LE, encouraged pt to self assist with UEs  Transfers  Overall transfer level Needs assistance  Equipment used Rolling walker (2 wheeled)  Transfers Sit to/from Stand  Sit to Stand Min assist;Min guard  General transfer comment verbal cues for hand placement, min assist from bed, min/guard from [facility] (able to use armrests)  Ambulation/Gait  Ambulation/Gait assistance Min guard  Ambulation Distance (Feet) 50 Feet  Assistive device Rolling walker (2 wheeled)  Gait Pattern/deviations Step-to pattern;Decreased stance time - right;Trunk flexed;Step-through pattern  General Gait Details verbal cues for posture, RW positioning, step length; shorter distance this afternoon due to fatigue  PT - End of Session  Equipment Utilized During Treatment Gait belt  Activity Tolerance Patient tolerated treatment well;Patient limited by fatigue  Patient left in chair;with call bell/phone  within reach;with family/visitor present  PT - Assessment/Plan  PT Plan Current plan remains appropriate  PT Frequency (ACUTE ONLY) 7X/week  Follow Up Recommendations Home health PT;Supervision/Assistance - 24 hour  PT equipment None recommended by PT  PT Goal Progression  Progress towards PT goals Progressing toward goals  PT Time Calculation  PT Start Time (ACUTE ONLY) 1427  PT Stop Time (ACUTE ONLY) 1455  PT Time Calculation (min) (ACUTE ONLY) 28 min  PT General Charges  $$ ACUTE PT VISIT 1 Procedure  PT Treatments  $Gait Training 8-22 mins   Carmelia Bake, PT, DPT 01/14/2017 Pager: [phone]

## 2017-01-14 NOTE — Progress Notes (Signed)
Subjective: 3 Days Post-Op Procedure(s) (LRB): RIGHT TOTAL HIP ARTHROPLASTY ANTERIOR APPROACH (Right) Patient reports pain as moderate.   Daughter in room with her. States she really hasn't been able to do much yet with PT. Has been somewhat weak and out of it the last 2 days likely due to pain meds. Hgb is stable. Daughter is concerned about her possible D/C with how weak she is and does not feel she is ready yet for D/C. Pt reports pain requiring narcotic pain meds.  Objective: Vital signs in last 24 hours: Temp:  [98.1 F (36.7 C)-98.3 F (36.8 C)] 98.3 F (36.8 C) (01/27 0507) Pulse Rate:  [62-111] 62 (01/27 0507) Resp:  [18-20] 20 (01/27 0507) BP: (95-101)/(41-58) 101/58 (01/27 0507) SpO2:  [95 %-97 %] 96 % (01/27 0507)  Intake/Output from previous day: 01/26 0701 - 01/27 0700 In: 360 [P.O.:360] Out: 300 [Urine:300] Intake/Output this shift: No intake/output data recorded.   Recent Labs  01/12/17 0450 01/13/17 0520 01/14/17 0540  HGB 10.6* 10.6* 10.3*    Recent Labs  01/13/17 0520 01/14/17 0540  WBC 6.4 6.3  RBC 3.78* 3.66*  HCT 32.3* 30.9*  PLT 97* 102*    Recent Labs  01/12/17 0450 01/13/17 0520  NA 142 139  K 3.5 3.7  CL 112* 110  CO2 25 25  BUN 14 13  CREATININE 0.61 0.70  GLUCOSE 119* 142*  CALCIUM 8.5* 8.6*   No results for input(s): LABPT, INR in the last 72 hours.  Neurologically intact ABD soft Neurovascular intact Sensation intact distally Intact pulses distally Dorsiflexion/Plantar flexion intact Incision: dressing C/D/I and no drainage No cellulitis present Compartment soft ecchymosis noted around incision. No calf pain or sign of DVT  Assessment/Plan: 3 Days Post-Op Procedure(s) (LRB): RIGHT TOTAL HIP ARTHROPLASTY ANTERIOR APPROACH (Right) Advance diet Up with therapy D/C IV fluids  Up with PT more today Awaiting report on echo / updates from cardiology D/C when medically stable and ready- anticipate likely tomorrow as  she will require more work with PT before D/C  Mael Delap M. 01/14/2017, 10:15 AM

## 2017-01-14 NOTE — Progress Notes (Signed)
Occupational Therapy Treatment Patient Details Name: [name] MRN: [id number] DOB: 14-Apr-1930 Today's Date: 01/14/2017    History of present illness Pt s/p R THR and with hx of peripheral neuropathy.     OT comments  Pt would benefit from [facility] to ensure safety and I with ADL activity in her home  Follow Up Recommendations  Supervision/Assistance - 24 hour;Home health OT    Equipment Recommendations  3 in 1 bedside commode       Precautions / Restrictions Precautions Precautions: Fall Restrictions Other Position/Activity Restrictions: WBAT       Mobility Bed Mobility Overal bed mobility: Needs Assistance Bed Mobility: Sit to Supine       Sit to supine: Mod assist   General bed mobility comments: pt in chair  Transfers Overall transfer level: Needs assistance Equipment used: Rolling walker (2 wheeled) Transfers: Sit to/from [employer] Sit to Stand: Min assist Stand pivot transfers: Min assist       General transfer comment: verbal cues for hand placement    Balance                                   ADL Overall ADL's : Needs assistance/impaired                                       General ADL Comments: Pt in chair after PT. OT session focused on BSC use for night time and ADL actiivty .OT demonstrated placement of BSC for night time and technique to be used.  Pt will beneift from HHOT to reinterate technique.    Also discussed families concerns of incontinence with using brief as well as a pad on floor underneath BSC.  Educated pt and familly on skin care with using a depend.                 Cognition   Behavior During Therapy: WFL for tasks assessed/performed Overall Cognitive Status: Within Functional Limits for tasks assessed                       Extremity/Trunk Assessment                          Pertinent Vitals/ Pain       Pain Assessment: Faces Pain Score: 6  Faces  Pain Scale: Hurts a little bit Pain Location: r hip Pain Descriptors / Indicators: Sore Pain Intervention(s): Monitored during session  Home Living Family/patient expects to be discharged to:: Private residence Living Arrangements: Alone Available Help at Discharge: Family Type of Home: House Home Access: Stairs to enter Secretary/administrator of Steps: 1   Home Layout: One level               Home Equipment: Walker - 2 wheels   Additional Comments: pt has a standard toilet and walk in shower.  A 3:1 was delivered to room      Prior Functioning/Environment Level of Independence: Independent            Frequency  Min 2X/week        Progress Toward Goals  OT Goals(current goals can now be found in the care plan section)  Progress towards OT goals: Progressing toward goals     Plan Discharge plan needs  to be updated       End of Session     Activity Tolerance Patient tolerated treatment well   Patient Left in chair;with call bell/phone within reach;with family/visitor present;with chair alarm set   Nurse Communication Mobility status (NT to remove ice in 20 minutes)        Time: 6962-9528 OT Time Calculation (min): 24 min  Charges: OT General Charges $OT Visit: 1 Procedure OT Treatments $Self Care/Home Management : 8-22 mins  Shonna Deiter, [place] Kung 01/14/2017, 3:41 PM

## 2017-01-14 NOTE — Progress Notes (Signed)
Physical Therapy Treatment Patient Details Name: [name] MRN: [id number] DOB: [birth year] Today's Date: 01/14/2017    History of Present Illness Pt s/p R THR and with hx of peripheral neuropathy.      PT Comments    Pt ambulated good distance in hallway and performed LE exercises.  Pt's mobility appears to have improved since yesterday however family uncertain of d/c home today.  Daughter would like pt to be comfortable with transfers (worried about getting to [facility] at night).   Follow Up Recommendations  Home health PT;Supervision/Assistance - 24 hour     Equipment Recommendations  None recommended by PT    Recommendations for Other Services       Precautions / Restrictions Precautions Precautions: Fall Restrictions Other Position/Activity Restrictions: WBAT    Mobility  Bed Mobility Overal bed mobility: Needs Assistance Bed Mobility: Sit to Supine       Sit to supine: Mod assist   General bed mobility comments: assist for LEs  Transfers Overall transfer level: Needs assistance Equipment used: Rolling walker (2 wheeled) Transfers: Sit to/from Stand Sit to Stand: Min assist Stand pivot transfers: Min assist       General transfer comment: verbal cues for hand placement, assist to rise and steady, assist to control descent after transfer BSC to bed due to fatigue (ambulated first)  Ambulation/Gait Ambulation/Gait assistance: Min guard Ambulation Distance (Feet): 100 Feet Assistive device: Rolling walker (2 wheeled) Gait Pattern/deviations: Step-to pattern;Decreased stance time - right;Trunk flexed;Step-through pattern     General Gait Details: poor forward flex posture with tight knocked knees (Valgus) and tendency to push walker too far to front. verbal cues for posture, RW positioning, step length   Stairs            Wheelchair Mobility    Modified Rankin (Stroke Patients Only)       Balance                                    Cognition Arousal/Alertness: Awake/alert Behavior During Therapy: WFL for tasks assessed/performed Overall Cognitive Status: Within Functional Limits for tasks assessed                      Exercises Total Joint Exercises Ankle Circles/Pumps: AROM;10 reps;Both Quad Sets: AROM;Right;10 reps Towel Squeeze: AROM;Both;10 reps Short Arc Quad: AROM;Right;10 reps Heel Slides: AAROM;Right;10 reps Hip ABduction/ADduction: AAROM;Right;10 reps    General Comments        Pertinent Vitals/Pain Pain Assessment: Faces Faces Pain Scale: Hurts a little bit Pain Location: R hip when weight bearing Pain Descriptors / Indicators: Discomfort;Sore Pain Intervention(s): Limited activity within patient's tolerance;Repositioned;Monitored during session    Home Living                      Prior Function            PT Goals (current goals can now be found in the care plan section) Progress towards PT goals: Progressing toward goals    Frequency    7X/week      PT Plan Current plan remains appropriate    Co-evaluation             End of Session Equipment Utilized During Treatment: Gait belt Activity Tolerance: Patient tolerated treatment well Patient left: in bed;with call bell/phone within reach;with family/visitor present     Time: 5784-6962 PT Time Calculation (min) (ACUTE  ONLY): 31 min  Charges:  $Gait Training: 8-22 mins $Therapeutic Exercise: 8-22 mins                    G Codes:      Penda Venturi,KATHrine E 02/13/2017, 1:26 PM Zenovia Jarred, PT, DPT 02-13-2017 Pager: [phone]

## 2017-01-15 LAB — TYPE AND SCREEN
ABO/RH(D): O NEG
ANTIBODY SCREEN: POSITIVE
DAT, IGG: NEGATIVE
Donor AG Type: NEGATIVE
PT AG Type: NEGATIVE
Unit division: 0

## 2017-01-15 NOTE — Care Management Note (Signed)
Case Management Note  Patient Details  Name: [name] MRN: [id number] Date of Birth: [birth year]  Subjective/Objective:                   Patient admitted with Osteoarthritis of the Right hip and status post Right total hip arthroplasty, anterior approach on 01/11/17.   Action/Plan: RNCM spoke with patient and son, ready for discharge to home with home health services through [facility] [place] and [facility]    Patient has received DME (bedside commode).  The following information has been faxed to homecare agency [phone])  facesheet, face to face, orders, OP note,  H&P, DC summary and orders by [facility] RNCM .  Patient has no other CM needs at this time.  AVS updated.   Patient's nurse updated on above and states patient has been cleared by PT for discharge.    Expected Discharge Date:  01/15/17               Expected Discharge Plan:  [place]  In-House Referral:     Discharge planning Services  CM Consult  Post Acute Care Choice:  Home Health Choice offered to:  Patient  DME Arranged:  3-N-1 DME Agency:  [place]:  PT [facility] Agency:  Other - See comment [facility] [place] and [facility].)  Status of Service:  Completed, signed off  If discussed at [employer], dates discussed:    Additional Comments:  Serena Colonel, RN 01/15/2017, 12:44 PM

## 2017-01-15 NOTE — Progress Notes (Signed)
Physical Therapy Treatment Patient Details Name: [name] MRN: [id number] DOB: May 17, 1930 Today's Date: 01/15/2017    History of Present Illness Pt s/p R THR and with hx of peripheral neuropathy.      PT Comments    POD # 4 pt progressing well Assisted our of recliner to [place] pt was able to self rise and self turn demonstrating correct hand placement.   Pt was also able to amb a greater distance.  Pt at a mobility level safe to D/C to home with family.   Follow Up Recommendations  Home health PT;Supervision/Assistance - 24 hour     Equipment Recommendations  None recommended by PT    Recommendations for Other Services       Precautions / Restrictions Precautions Precautions: Fall Restrictions Weight Bearing Restrictions: No Other Position/Activity Restrictions: WBAT    Mobility  Bed Mobility               General bed mobility comments: OOB in recliner  Transfers Overall transfer level: Needs assistance Equipment used: Rolling walker (2 wheeled) Transfers: Sit to/from [employer] Transfers Sit to Stand: Supervision;Min guard Stand pivot transfers: Supervision;Min guard       General transfer comment: with increased time, pt was able to self rise from recliner and safely turn to [facility] and pull down undergarment all at [employer].    Ambulation/Gait Ambulation/Gait assistance: Min guard;Supervision Ambulation Distance (Feet): 115 Feet Assistive device: Rolling walker (2 wheeled) Gait Pattern/deviations: Step-to pattern;Decreased stance time - right;Trunk flexed;Step-through pattern Gait velocity: decreased   General Gait Details: much improved gait with correct walker to self distance and upright posture.  Pt tolerated an increased distance and had NO LOB.  Slow but steady with decreased shuffled steps.     Stairs            Wheelchair Mobility    Modified Rankin (Stroke Patients Only)       Balance                                     Cognition Arousal/Alertness: Awake/alert Behavior During Therapy: WFL for tasks assessed/performed Overall Cognitive Status: Within Functional Limits for tasks assessed                      Exercises   Total Hip Replacement TE's 10 reps ankle pumps 10 reps knee presses 10 reps heel slides 10 reps SAQ's 10 reps ABD Followed by ICE     General Comments        Pertinent Vitals/Pain Pain Assessment: Faces Faces Pain Scale: Hurts a little bit Pain Location: R hip Pain Descriptors / Indicators: Constant Pain Intervention(s): Monitored during session;Repositioned;Ice applied    Home Living                      Prior Function            PT Goals (current goals can now be found in the care plan section) Progress towards PT goals: Progressing toward goals    Frequency    7X/week      PT Plan Current plan remains appropriate    Co-evaluation             End of Session Equipment Utilized During Treatment: Gait belt Activity Tolerance: Patient tolerated treatment well Patient left: in chair;with call bell/phone within reach;with chair alarm set  Time: [id number] PT Time Calculation (min) (ACUTE ONLY): 24 min  Charges:  $Gait Training: 8-22 mins $Therapeutic Activity: 8-22 mins                    G Codes:      Rica Koyanagi  PTA [facility]  Acute  Rehab Pager      [phone]

## 2017-01-15 NOTE — Progress Notes (Signed)
    Subjective: 4 Days Post-Op Procedure(s) (LRB): RIGHT TOTAL HIP ARTHROPLASTY ANTERIOR APPROACH (Right) Patient reports pain as 3 on 0-10 scale.   Denies CP or SOB.  Voiding without difficulty. Positive flatus. Objective: Vital signs in last 24 hours: Temp:  [98.5 F (36.9 C)-99.4 F (37.4 C)] 98.5 F (36.9 C) (01/28 0416) Pulse Rate:  [42-76] 70 (01/28 0416) Resp:  [18-20] 20 (01/28 0416) BP: (101-121)/(50-70) 121/70 (01/28 0416) SpO2:  [96 %] 96 % (01/28 0416) Weight:  [81.3 kg (179 lb 3.7 oz)] 81.3 kg (179 lb 3.7 oz) (01/28 0416)  Intake/Output from previous day: 01/27 0701 - 01/28 0700 In: 498.3 [P.O.:480; I.V.:18.3] Out: 700 [Urine:700] Intake/Output this shift: No intake/output data recorded.  Labs:  Recent Labs  01/13/17 0520 01/14/17 0540  HGB 10.6* 10.3*    Recent Labs  01/13/17 0520 01/14/17 0540  WBC 6.4 6.3  RBC 3.78* 3.66*  HCT 32.3* 30.9*  PLT 97* 102*    Recent Labs  01/13/17 0520  NA 139  K 3.7  CL 110  CO2 25  BUN 13  CREATININE 0.70  GLUCOSE 142*  CALCIUM 8.6*   No results for input(s): LABPT, INR in the last 72 hours.  Physical Exam: Neurologically intact ABD soft Sensation intact distally Dorsiflexion/Plantar flexion intact Incision: dressing C/D/I Compartment soft  Assessment/Plan: 4 Days Post-Op Procedure(s) (LRB): RIGHT TOTAL HIP ARTHROPLASTY ANTERIOR APPROACH (Right) Up with therapy  DC home today if cleared by PT Medications on chart  Bayne Fosnaugh, Darla Lesches for Dr. Melina Schools [facility] [phone] 01/15/2017, 9:34 AM

## 2017-01-17 DIAGNOSIS — I5032 Chronic diastolic (congestive) heart failure: Secondary | ICD-10-CM | POA: Diagnosis not present

## 2017-01-17 DIAGNOSIS — M47817 Spondylosis without myelopathy or radiculopathy, lumbosacral region: Secondary | ICD-10-CM | POA: Diagnosis not present

## 2017-01-17 DIAGNOSIS — G40909 Epilepsy, unspecified, not intractable, without status epilepticus: Secondary | ICD-10-CM | POA: Diagnosis not present

## 2017-01-17 DIAGNOSIS — Z471 Aftercare following joint replacement surgery: Secondary | ICD-10-CM | POA: Diagnosis not present

## 2017-01-17 DIAGNOSIS — G629 Polyneuropathy, unspecified: Secondary | ICD-10-CM | POA: Diagnosis not present

## 2017-01-17 DIAGNOSIS — I051 Rheumatic mitral insufficiency: Secondary | ICD-10-CM | POA: Diagnosis not present

## 2017-01-19 DIAGNOSIS — I5032 Chronic diastolic (congestive) heart failure: Secondary | ICD-10-CM | POA: Diagnosis not present

## 2017-01-19 DIAGNOSIS — G629 Polyneuropathy, unspecified: Secondary | ICD-10-CM | POA: Diagnosis not present

## 2017-01-19 DIAGNOSIS — Z471 Aftercare following joint replacement surgery: Secondary | ICD-10-CM | POA: Diagnosis not present

## 2017-01-19 DIAGNOSIS — G40909 Epilepsy, unspecified, not intractable, without status epilepticus: Secondary | ICD-10-CM | POA: Diagnosis not present

## 2017-01-19 DIAGNOSIS — I051 Rheumatic mitral insufficiency: Secondary | ICD-10-CM | POA: Diagnosis not present

## 2017-01-19 DIAGNOSIS — M47817 Spondylosis without myelopathy or radiculopathy, lumbosacral region: Secondary | ICD-10-CM | POA: Diagnosis not present

## 2017-01-21 DIAGNOSIS — Z471 Aftercare following joint replacement surgery: Secondary | ICD-10-CM | POA: Diagnosis not present

## 2017-01-21 DIAGNOSIS — G629 Polyneuropathy, unspecified: Secondary | ICD-10-CM | POA: Diagnosis not present

## 2017-01-21 DIAGNOSIS — M47817 Spondylosis without myelopathy or radiculopathy, lumbosacral region: Secondary | ICD-10-CM | POA: Diagnosis not present

## 2017-01-21 DIAGNOSIS — G40909 Epilepsy, unspecified, not intractable, without status epilepticus: Secondary | ICD-10-CM | POA: Diagnosis not present

## 2017-01-21 DIAGNOSIS — I051 Rheumatic mitral insufficiency: Secondary | ICD-10-CM | POA: Diagnosis not present

## 2017-01-21 DIAGNOSIS — I5032 Chronic diastolic (congestive) heart failure: Secondary | ICD-10-CM | POA: Diagnosis not present

## 2017-01-23 DIAGNOSIS — Z471 Aftercare following joint replacement surgery: Secondary | ICD-10-CM | POA: Diagnosis not present

## 2017-01-23 DIAGNOSIS — G629 Polyneuropathy, unspecified: Secondary | ICD-10-CM | POA: Diagnosis not present

## 2017-01-23 DIAGNOSIS — G40909 Epilepsy, unspecified, not intractable, without status epilepticus: Secondary | ICD-10-CM | POA: Diagnosis not present

## 2017-01-23 DIAGNOSIS — I051 Rheumatic mitral insufficiency: Secondary | ICD-10-CM | POA: Diagnosis not present

## 2017-01-23 DIAGNOSIS — M47817 Spondylosis without myelopathy or radiculopathy, lumbosacral region: Secondary | ICD-10-CM | POA: Diagnosis not present

## 2017-01-23 DIAGNOSIS — I5032 Chronic diastolic (congestive) heart failure: Secondary | ICD-10-CM | POA: Diagnosis not present

## 2017-01-24 DIAGNOSIS — Z471 Aftercare following joint replacement surgery: Secondary | ICD-10-CM | POA: Diagnosis not present

## 2017-01-24 DIAGNOSIS — Z96641 Presence of right artificial hip joint: Secondary | ICD-10-CM | POA: Diagnosis not present

## 2017-01-26 ENCOUNTER — Encounter: Payer: Self-pay | Admitting: Internal Medicine

## 2017-01-27 DIAGNOSIS — G40909 Epilepsy, unspecified, not intractable, without status epilepticus: Secondary | ICD-10-CM | POA: Diagnosis not present

## 2017-01-27 DIAGNOSIS — I051 Rheumatic mitral insufficiency: Secondary | ICD-10-CM | POA: Diagnosis not present

## 2017-01-27 DIAGNOSIS — Z471 Aftercare following joint replacement surgery: Secondary | ICD-10-CM | POA: Diagnosis not present

## 2017-01-27 DIAGNOSIS — I5032 Chronic diastolic (congestive) heart failure: Secondary | ICD-10-CM | POA: Diagnosis not present

## 2017-01-27 DIAGNOSIS — G629 Polyneuropathy, unspecified: Secondary | ICD-10-CM | POA: Diagnosis not present

## 2017-01-27 DIAGNOSIS — M47817 Spondylosis without myelopathy or radiculopathy, lumbosacral region: Secondary | ICD-10-CM | POA: Diagnosis not present

## 2017-01-31 DIAGNOSIS — I051 Rheumatic mitral insufficiency: Secondary | ICD-10-CM | POA: Diagnosis not present

## 2017-01-31 DIAGNOSIS — G629 Polyneuropathy, unspecified: Secondary | ICD-10-CM | POA: Diagnosis not present

## 2017-01-31 DIAGNOSIS — Z471 Aftercare following joint replacement surgery: Secondary | ICD-10-CM | POA: Diagnosis not present

## 2017-01-31 DIAGNOSIS — I5032 Chronic diastolic (congestive) heart failure: Secondary | ICD-10-CM | POA: Diagnosis not present

## 2017-01-31 DIAGNOSIS — M47817 Spondylosis without myelopathy or radiculopathy, lumbosacral region: Secondary | ICD-10-CM | POA: Diagnosis not present

## 2017-01-31 DIAGNOSIS — G40909 Epilepsy, unspecified, not intractable, without status epilepticus: Secondary | ICD-10-CM | POA: Diagnosis not present

## 2017-02-02 DIAGNOSIS — I051 Rheumatic mitral insufficiency: Secondary | ICD-10-CM | POA: Diagnosis not present

## 2017-02-02 DIAGNOSIS — Z471 Aftercare following joint replacement surgery: Secondary | ICD-10-CM | POA: Diagnosis not present

## 2017-02-02 DIAGNOSIS — G40909 Epilepsy, unspecified, not intractable, without status epilepticus: Secondary | ICD-10-CM | POA: Diagnosis not present

## 2017-02-02 DIAGNOSIS — I5032 Chronic diastolic (congestive) heart failure: Secondary | ICD-10-CM | POA: Diagnosis not present

## 2017-02-02 DIAGNOSIS — M47817 Spondylosis without myelopathy or radiculopathy, lumbosacral region: Secondary | ICD-10-CM | POA: Diagnosis not present

## 2017-02-02 DIAGNOSIS — G629 Polyneuropathy, unspecified: Secondary | ICD-10-CM | POA: Diagnosis not present

## 2017-02-03 DIAGNOSIS — I5032 Chronic diastolic (congestive) heart failure: Secondary | ICD-10-CM | POA: Diagnosis not present

## 2017-02-03 DIAGNOSIS — N3941 Urge incontinence: Secondary | ICD-10-CM | POA: Diagnosis not present

## 2017-02-03 DIAGNOSIS — I051 Rheumatic mitral insufficiency: Secondary | ICD-10-CM | POA: Diagnosis not present

## 2017-02-03 DIAGNOSIS — G629 Polyneuropathy, unspecified: Secondary | ICD-10-CM | POA: Diagnosis not present

## 2017-02-03 DIAGNOSIS — M47817 Spondylosis without myelopathy or radiculopathy, lumbosacral region: Secondary | ICD-10-CM | POA: Diagnosis not present

## 2017-02-03 DIAGNOSIS — R3915 Urgency of urination: Secondary | ICD-10-CM | POA: Diagnosis not present

## 2017-02-03 DIAGNOSIS — Z471 Aftercare following joint replacement surgery: Secondary | ICD-10-CM | POA: Diagnosis not present

## 2017-02-03 DIAGNOSIS — R350 Frequency of micturition: Secondary | ICD-10-CM | POA: Diagnosis not present

## 2017-02-03 DIAGNOSIS — G40909 Epilepsy, unspecified, not intractable, without status epilepticus: Secondary | ICD-10-CM | POA: Diagnosis not present

## 2017-02-03 DIAGNOSIS — R8299 Other abnormal findings in urine: Secondary | ICD-10-CM | POA: Diagnosis not present

## 2017-02-03 DIAGNOSIS — N952 Postmenopausal atrophic vaginitis: Secondary | ICD-10-CM | POA: Diagnosis not present

## 2017-02-06 DIAGNOSIS — G40909 Epilepsy, unspecified, not intractable, without status epilepticus: Secondary | ICD-10-CM | POA: Diagnosis not present

## 2017-02-06 DIAGNOSIS — M47817 Spondylosis without myelopathy or radiculopathy, lumbosacral region: Secondary | ICD-10-CM | POA: Diagnosis not present

## 2017-02-06 DIAGNOSIS — Z471 Aftercare following joint replacement surgery: Secondary | ICD-10-CM | POA: Diagnosis not present

## 2017-02-06 DIAGNOSIS — G629 Polyneuropathy, unspecified: Secondary | ICD-10-CM | POA: Diagnosis not present

## 2017-02-06 DIAGNOSIS — I051 Rheumatic mitral insufficiency: Secondary | ICD-10-CM | POA: Diagnosis not present

## 2017-02-06 DIAGNOSIS — I5032 Chronic diastolic (congestive) heart failure: Secondary | ICD-10-CM | POA: Diagnosis not present

## 2017-02-08 DIAGNOSIS — I5032 Chronic diastolic (congestive) heart failure: Secondary | ICD-10-CM | POA: Diagnosis not present

## 2017-02-08 DIAGNOSIS — M47817 Spondylosis without myelopathy or radiculopathy, lumbosacral region: Secondary | ICD-10-CM | POA: Diagnosis not present

## 2017-02-08 DIAGNOSIS — G40909 Epilepsy, unspecified, not intractable, without status epilepticus: Secondary | ICD-10-CM | POA: Diagnosis not present

## 2017-02-08 DIAGNOSIS — G629 Polyneuropathy, unspecified: Secondary | ICD-10-CM | POA: Diagnosis not present

## 2017-02-08 DIAGNOSIS — I051 Rheumatic mitral insufficiency: Secondary | ICD-10-CM | POA: Diagnosis not present

## 2017-02-08 DIAGNOSIS — Z471 Aftercare following joint replacement surgery: Secondary | ICD-10-CM | POA: Diagnosis not present

## 2017-02-09 ENCOUNTER — Encounter: Payer: Self-pay | Admitting: Cardiology

## 2017-02-09 NOTE — Progress Notes (Signed)
Cardiology Office Note  Date: 02/10/2017   ID: [name], DOB Jul 11, 1930, MRN [id number]  PCP: Manon Hilding, MD  Primary Cardiologist: Rozann Lesches, MD   Chief Complaint  Patient presents with  . Diastolic heart failure    History of Present Illness: [name] is an 83 y.o. female last seen in September 2017. She is referred back to the office for a post hospital visit. Records indicate right hip surgery in January, no obvious perioperative cardiac events. She does not report any unusual shortness of breath or leg swelling. She has been working with PT at home, using a walker.  We went over her medications, she brought in a somewhat confusing list. Call placed to pharmacy to clarify. Blood pressure and heart rate are in reasonable range today. I did review her recent ECG from January. Her weight is stable.  Past Medical History:  Diagnosis Date  . Arthritis   . Chronic diastolic heart failure ([place])   . Diverticulosis of colon (without mention of hemorrhage)    TCS by Dr. Gala Romney  . GERD (gastroesophageal reflux disease)   . Hemorrhoids, internal    TCS by Dr. Gala Romney  . Irritable bowel syndrome   . Migraine   . Mitral regurgitation   . Peripheral polyneuropathy ([facility])    Confirmed by EMG and nerve conduction velocities   . Pulmonary nodule   . Seizures ([place])    seizure in 2011  . Syncope    Neurocardiogenic - positive tilt table test, on Florinef  . Tubular adenoma   . UTI (urinary tract infection)    frequent    Past Surgical History:  Procedure Laterality Date  . APPENDECTOMY    . Back Pain     going to chiropracator for back and hip pain  . BACK SURGERY     x2  . bladder tack    . CATARACT EXTRACTION, BILATERAL  2017  . CHOLECYSTECTOMY    . COLONOSCOPY  06/16/2005   internal hemorrhoids, L side diverticula - Dr. Gala Romney  . COLONOSCOPY N/A 05/02/2013   Dr. Gala Romney- normal rectum, scattered L sided diverticula. bx= tubular adenoma  .  ESOPHAGOGASTRODUODENOSCOPY  12/21/2006     Dr. Vivi Ferns esophagus/Patulous EG junction, small to moderate sized hiatal hernia,   multiple fundal gland type appearing polyps biopsied, otherwise normal  . FOOT SURGERY     cyst removed  . TOTAL ABDOMINAL HYSTERECTOMY    . TOTAL HIP ARTHROPLASTY Right 01/11/2017   Procedure: RIGHT TOTAL HIP ARTHROPLASTY ANTERIOR APPROACH;  Surgeon: Gaynelle Arabian, MD;  Location: [facility] ORS;  Service: Orthopedics;  Laterality: Right;  . WRIST SURGERY      Current Outpatient Prescriptions  Medication Sig Dispense Refill  . aspirin EC 81 MG tablet Take 81 mg by mouth daily.    Marland Kitchen DEXILANT 60 MG capsule TAKE 1 CAPSULE DAILY (Patient taking differently: Take 60mg s in the evening) 90 capsule 2  . fexofenadine (ALLEGRA) 180 MG tablet Take 180 mg by mouth daily.    . fludrocortisone (FLORINEF) 0.1 MG tablet TAKE 1 TABLET THREE TIMES A WEEK 36 tablet 3  . fluticasone (FLONASE) 50 MCG/ACT nasal spray Place 2 sprays into the nose as needed for allergies.     . furosemide (LASIX) 20 MG tablet Take 1 tablet (20 mg total) by mouth daily. Pt states she does not take it on Sundays and when she has a doctor's appt. (Patient taking differently: Take 20 mg by mouth daily. ) 90 tablet 3  .  HYDROmorphone (DILAUDID) 2 MG tablet Take 1-2 tablets (2-4 mg total) by mouth every 4 (four) hours as needed for moderate pain or severe pain. 84 tablet 0  . levothyroxine (SYNTHROID, LEVOTHROID) 75 MCG tablet Take 75 mcg by mouth daily before breakfast.    . methocarbamol (ROBAXIN) 500 MG tablet Take 1 tablet (500 mg total) by mouth every 6 (six) hours as needed for muscle spasms. 80 tablet 0  . mirabegron ER (MYRBETRIQ) 50 MG TB24 tablet Take 50 mg by mouth at bedtime.     . montelukast (SINGULAIR) 10 MG tablet Take 10 mg by mouth at bedtime.     . nitrofurantoin (MACRODANTIN) 50 MG capsule Take 50 mg by mouth at bedtime.    . nitroGLYCERIN (NITROSTAT) 0.4 MG SL tablet Place 0.4 mg under the tongue  every 5 (five) minutes as needed for chest pain.    Vladimir Faster Glycol-Propyl Glycol (SYSTANE OP) Apply 1 drop to eye 2 (two) times daily.    . polyethylene glycol (MIRALAX / GLYCOLAX) packet Take 17 g by mouth at bedtime as needed for mild constipation.    . polyvinyl alcohol (LIQUIFILM TEARS) 1.4 % ophthalmic solution Place 1 drop into both eyes as needed for dry eyes. 15 mL 0  . rivaroxaban (XARELTO) 10 MG TABS tablet Take 1 tablet (10 mg total) by mouth daily with breakfast. Take Xarelto for two and a half more weeks following discharge from the hospital, then discontinue Xarelto. Once the patient has completed the Xarelto, they may resume the 81 mg Aspirin. 18 tablet 0  . topiramate (TOPAMAX) 50 MG tablet Take 2 tabs in the morning and 3 tabs in the evening. (Patient taking differently: Take 2 tabs bid) 150 tablet 5  . traMADol (ULTRAM) 50 MG tablet Take 1-2 tablets (50-100 mg total) by mouth every 6 (six) hours as needed (mild pain). 56 tablet 0  . Wheat Dextrin (BENEFIBER DRINK MIX) PACK Take 1 Dose by mouth. 1 to 2 times daily as needed for constipation     No current facility-administered medications for this visit.    Allergies:  Cephalexin; Doxazosin mesylate; Levofloxacin; Codeine; Indomethacin; Ivp dye [iodinated diagnostic agents]; Morphine; Sulfamethoxazole-trimethoprim; and Penicillins   Social History: The patient  reports that she has never smoked. She has never used smokeless tobacco. She reports that she does not drink alcohol or use drugs.   ROS:  Please see the history of present illness. Otherwise, complete review of systems is positive for improving hip stiffness.  All other systems are reviewed and negative.   Physical Exam: VS:  BP 124/82   Pulse 60   Ht 5\' 8"  (1.727 m)   Wt 178 lb 3.2 oz (80.8 kg)   SpO2 99%   BMI 27.10 kg/m , BMI Body mass index is 27.1 kg/m.  Wt Readings from Last 3 Encounters:  02/10/17 178 lb 3.2 oz (80.8 kg)  01/15/17 179 lb 3.7 oz (81.3  kg)  01/06/17 179 lb 6.4 oz (81.4 kg)    Tall statured woman, appears comfortable at rest.  HEENT: Conjunctiva and lids normal, oropharynx clear.  Neck: Supple, no elevated JVP or carotid bruits, prominent right sided carotid pulsation likely tortuous section of the vessel, no thyromegaly.  Lungs: Clear to auscultation, nonlabored breathing at rest.  Cardiac: Regular rate and rhythm, soft apical systolic murmur, no S3.  Abdomen: Soft, nontender,bowel sounds present.  Extremities: Trace ankle edema, distal pulses 2+.  ECG: I personally reviewed the tracing from 01/12/2017 which showed sinus  rhythm with PACs in the form of atrial bigeminy.  Recent Labwork: 01/06/2017: ALT 14; AST 22 01/13/2017: BUN 13; Creatinine, Ser 0.70; Magnesium 1.6; Potassium 3.7; Sodium 139 01/14/2017: Hemoglobin 10.3; Platelets 102   Other Studies Reviewed Today:  Echocardiogram 03/12/2014: Study Conclusions  - Left ventricle: The cavity size was normal. Wall thickness was normal. Systolic function was normal. The estimated ejection fraction was in the range of 60% to 65%. Wall motion was normal; there were no regional wall motion abnormalities. Features are consistent with a pseudonormal left ventricular filling pattern, with concomitant abnormal relaxation and increased filling pressure (grade 2 diastolic dysfunction). - Aortic valve: Trileaflet; mildly calcified leaflets. Trivial regurgitation. - Mitral valve: Mild to moderate regurgitation. - Left atrium: The atrium was at the upper limits of normal in size. Volume index: 31.84ml/m^2 (S). - Right ventricle: The cavity size was mildly dilated. Systolic function was normal. - Right atrium: Central venous pressure: 92mm Hg (est). - Tricuspid valve: Moderate regurgitation. - Pulmonary arteries: PA peak pressure: 74mm Hg (S). - Pericardium, extracardiac: There was no pericardial effusion. Impressions:  - Normal LV wall thickness  with LVEF [id number], grade 2 diastolic dysfunction. Upper normal left atrial chamber size. Mild to moderate mitral regurgitation. MIld RV dilatation with normal contraction. Moderate tricuspid regurgitation with PASP 49 mmHg.  Assessment and Plan:  1. Symptomatically stable chronic diastolic heart failure. Continues with Lasix, no significant change in weight. LVEF normal range with grade 2 diastolic dysfunction by last assessment. No clear indication for follow-up echocardiogram at this time.  2. History of neurocardiogenic syncope, continues on Florinef. No recurrences.  Current medicines were reviewed with the patient today.  Disposition: Follow-up as scheduled from last visit.  Signed, Satira Sark, MD, [facility] 02/10/2017 4:24 PM    [place] [facility] [place], [place], [place] [address] Phone: [phone]; Fax: [phone]

## 2017-02-10 ENCOUNTER — Encounter: Payer: Self-pay | Admitting: Cardiology

## 2017-02-10 ENCOUNTER — Ambulatory Visit (INDEPENDENT_AMBULATORY_CARE_PROVIDER_SITE_OTHER): Payer: Medicare Other | Admitting: Cardiology

## 2017-02-10 VITALS — BP 124/82 | HR 60 | Ht 68.0 in | Wt 178.2 lb

## 2017-02-10 DIAGNOSIS — I5032 Chronic diastolic (congestive) heart failure: Secondary | ICD-10-CM | POA: Diagnosis not present

## 2017-02-10 DIAGNOSIS — R55 Syncope and collapse: Secondary | ICD-10-CM

## 2017-02-10 NOTE — Patient Instructions (Signed)

## 2017-02-13 DIAGNOSIS — N390 Urinary tract infection, site not specified: Secondary | ICD-10-CM | POA: Diagnosis not present

## 2017-02-14 DIAGNOSIS — Z96641 Presence of right artificial hip joint: Secondary | ICD-10-CM | POA: Diagnosis not present

## 2017-02-14 DIAGNOSIS — Z471 Aftercare following joint replacement surgery: Secondary | ICD-10-CM | POA: Diagnosis not present

## 2017-02-14 DIAGNOSIS — N390 Urinary tract infection, site not specified: Secondary | ICD-10-CM | POA: Diagnosis not present

## 2017-02-15 DIAGNOSIS — N390 Urinary tract infection, site not specified: Secondary | ICD-10-CM | POA: Diagnosis not present

## 2017-02-16 DIAGNOSIS — M47817 Spondylosis without myelopathy or radiculopathy, lumbosacral region: Secondary | ICD-10-CM | POA: Diagnosis not present

## 2017-02-16 DIAGNOSIS — I051 Rheumatic mitral insufficiency: Secondary | ICD-10-CM | POA: Diagnosis not present

## 2017-02-16 DIAGNOSIS — N390 Urinary tract infection, site not specified: Secondary | ICD-10-CM | POA: Diagnosis not present

## 2017-02-16 DIAGNOSIS — G40909 Epilepsy, unspecified, not intractable, without status epilepticus: Secondary | ICD-10-CM | POA: Diagnosis not present

## 2017-02-16 DIAGNOSIS — G629 Polyneuropathy, unspecified: Secondary | ICD-10-CM | POA: Diagnosis not present

## 2017-02-16 DIAGNOSIS — Z471 Aftercare following joint replacement surgery: Secondary | ICD-10-CM | POA: Diagnosis not present

## 2017-02-16 DIAGNOSIS — I5032 Chronic diastolic (congestive) heart failure: Secondary | ICD-10-CM | POA: Diagnosis not present

## 2017-02-17 DIAGNOSIS — N390 Urinary tract infection, site not specified: Secondary | ICD-10-CM | POA: Diagnosis not present

## 2017-02-21 DIAGNOSIS — Z96641 Presence of right artificial hip joint: Secondary | ICD-10-CM | POA: Diagnosis not present

## 2017-02-21 DIAGNOSIS — Z471 Aftercare following joint replacement surgery: Secondary | ICD-10-CM | POA: Diagnosis not present

## 2017-02-21 DIAGNOSIS — R2689 Other abnormalities of gait and mobility: Secondary | ICD-10-CM | POA: Diagnosis not present

## 2017-02-23 DIAGNOSIS — Z471 Aftercare following joint replacement surgery: Secondary | ICD-10-CM | POA: Diagnosis not present

## 2017-02-23 DIAGNOSIS — R2689 Other abnormalities of gait and mobility: Secondary | ICD-10-CM | POA: Diagnosis not present

## 2017-02-23 DIAGNOSIS — Z96641 Presence of right artificial hip joint: Secondary | ICD-10-CM | POA: Diagnosis not present

## 2017-02-28 DIAGNOSIS — R2689 Other abnormalities of gait and mobility: Secondary | ICD-10-CM | POA: Diagnosis not present

## 2017-02-28 DIAGNOSIS — Z471 Aftercare following joint replacement surgery: Secondary | ICD-10-CM | POA: Diagnosis not present

## 2017-02-28 DIAGNOSIS — Z96641 Presence of right artificial hip joint: Secondary | ICD-10-CM | POA: Diagnosis not present

## 2017-03-02 DIAGNOSIS — Z96641 Presence of right artificial hip joint: Secondary | ICD-10-CM | POA: Diagnosis not present

## 2017-03-02 DIAGNOSIS — R2689 Other abnormalities of gait and mobility: Secondary | ICD-10-CM | POA: Diagnosis not present

## 2017-03-02 DIAGNOSIS — Z471 Aftercare following joint replacement surgery: Secondary | ICD-10-CM | POA: Diagnosis not present

## 2017-03-03 DIAGNOSIS — N390 Urinary tract infection, site not specified: Secondary | ICD-10-CM | POA: Diagnosis not present

## 2017-03-03 DIAGNOSIS — N952 Postmenopausal atrophic vaginitis: Secondary | ICD-10-CM | POA: Diagnosis not present

## 2017-03-03 DIAGNOSIS — N3941 Urge incontinence: Secondary | ICD-10-CM | POA: Diagnosis not present

## 2017-03-03 DIAGNOSIS — B964 Proteus (mirabilis) (morganii) as the cause of diseases classified elsewhere: Secondary | ICD-10-CM | POA: Diagnosis not present

## 2017-03-07 DIAGNOSIS — R2689 Other abnormalities of gait and mobility: Secondary | ICD-10-CM | POA: Diagnosis not present

## 2017-03-07 DIAGNOSIS — Z96641 Presence of right artificial hip joint: Secondary | ICD-10-CM | POA: Diagnosis not present

## 2017-03-07 DIAGNOSIS — Z471 Aftercare following joint replacement surgery: Secondary | ICD-10-CM | POA: Diagnosis not present

## 2017-03-09 DIAGNOSIS — L728 Other follicular cysts of the skin and subcutaneous tissue: Secondary | ICD-10-CM | POA: Diagnosis not present

## 2017-03-09 DIAGNOSIS — Z471 Aftercare following joint replacement surgery: Secondary | ICD-10-CM | POA: Diagnosis not present

## 2017-03-09 DIAGNOSIS — Z96641 Presence of right artificial hip joint: Secondary | ICD-10-CM | POA: Diagnosis not present

## 2017-03-09 DIAGNOSIS — R2689 Other abnormalities of gait and mobility: Secondary | ICD-10-CM | POA: Diagnosis not present

## 2017-03-09 DIAGNOSIS — L0100 Impetigo, unspecified: Secondary | ICD-10-CM | POA: Diagnosis not present

## 2017-03-13 DIAGNOSIS — B351 Tinea unguium: Secondary | ICD-10-CM | POA: Diagnosis not present

## 2017-03-13 DIAGNOSIS — I70203 Unspecified atherosclerosis of native arteries of extremities, bilateral legs: Secondary | ICD-10-CM | POA: Diagnosis not present

## 2017-03-13 DIAGNOSIS — L84 Corns and callosities: Secondary | ICD-10-CM | POA: Diagnosis not present

## 2017-03-14 DIAGNOSIS — Z96641 Presence of right artificial hip joint: Secondary | ICD-10-CM | POA: Diagnosis not present

## 2017-03-14 DIAGNOSIS — R2689 Other abnormalities of gait and mobility: Secondary | ICD-10-CM | POA: Diagnosis not present

## 2017-03-14 DIAGNOSIS — Z471 Aftercare following joint replacement surgery: Secondary | ICD-10-CM | POA: Diagnosis not present

## 2017-03-16 DIAGNOSIS — R2689 Other abnormalities of gait and mobility: Secondary | ICD-10-CM | POA: Diagnosis not present

## 2017-03-16 DIAGNOSIS — Z96641 Presence of right artificial hip joint: Secondary | ICD-10-CM | POA: Diagnosis not present

## 2017-03-16 DIAGNOSIS — Z471 Aftercare following joint replacement surgery: Secondary | ICD-10-CM | POA: Diagnosis not present

## 2017-03-21 DIAGNOSIS — Z471 Aftercare following joint replacement surgery: Secondary | ICD-10-CM | POA: Diagnosis not present

## 2017-03-21 DIAGNOSIS — R2689 Other abnormalities of gait and mobility: Secondary | ICD-10-CM | POA: Diagnosis not present

## 2017-03-21 DIAGNOSIS — Z96641 Presence of right artificial hip joint: Secondary | ICD-10-CM | POA: Diagnosis not present

## 2017-03-22 DIAGNOSIS — Z96641 Presence of right artificial hip joint: Secondary | ICD-10-CM | POA: Diagnosis not present

## 2017-03-22 DIAGNOSIS — Z471 Aftercare following joint replacement surgery: Secondary | ICD-10-CM | POA: Diagnosis not present

## 2017-03-22 DIAGNOSIS — R2689 Other abnormalities of gait and mobility: Secondary | ICD-10-CM | POA: Diagnosis not present

## 2017-04-04 DIAGNOSIS — N952 Postmenopausal atrophic vaginitis: Secondary | ICD-10-CM | POA: Diagnosis not present

## 2017-04-04 DIAGNOSIS — R3915 Urgency of urination: Secondary | ICD-10-CM | POA: Diagnosis not present

## 2017-04-04 DIAGNOSIS — N3946 Mixed incontinence: Secondary | ICD-10-CM | POA: Diagnosis not present

## 2017-04-04 DIAGNOSIS — N3281 Overactive bladder: Secondary | ICD-10-CM | POA: Diagnosis not present

## 2017-04-04 DIAGNOSIS — N3941 Urge incontinence: Secondary | ICD-10-CM | POA: Diagnosis not present

## 2017-04-04 DIAGNOSIS — Z87440 Personal history of urinary (tract) infections: Secondary | ICD-10-CM | POA: Diagnosis not present

## 2017-04-07 DIAGNOSIS — R5383 Other fatigue: Secondary | ICD-10-CM | POA: Diagnosis not present

## 2017-04-07 DIAGNOSIS — G40909 Epilepsy, unspecified, not intractable, without status epilepticus: Secondary | ICD-10-CM | POA: Diagnosis not present

## 2017-04-07 DIAGNOSIS — M1990 Unspecified osteoarthritis, unspecified site: Secondary | ICD-10-CM | POA: Diagnosis not present

## 2017-04-07 DIAGNOSIS — E039 Hypothyroidism, unspecified: Secondary | ICD-10-CM | POA: Diagnosis not present

## 2017-04-07 DIAGNOSIS — K210 Gastro-esophageal reflux disease with esophagitis: Secondary | ICD-10-CM | POA: Diagnosis not present

## 2017-04-07 DIAGNOSIS — R7301 Impaired fasting glucose: Secondary | ICD-10-CM | POA: Diagnosis not present

## 2017-04-10 ENCOUNTER — Other Ambulatory Visit: Payer: Self-pay | Admitting: Gastroenterology

## 2017-04-10 ENCOUNTER — Other Ambulatory Visit: Payer: Self-pay | Admitting: Cardiology

## 2017-04-10 DIAGNOSIS — G40909 Epilepsy, unspecified, not intractable, without status epilepticus: Secondary | ICD-10-CM | POA: Diagnosis not present

## 2017-04-10 DIAGNOSIS — Z1389 Encounter for screening for other disorder: Secondary | ICD-10-CM | POA: Diagnosis not present

## 2017-04-10 DIAGNOSIS — I951 Orthostatic hypotension: Secondary | ICD-10-CM | POA: Diagnosis not present

## 2017-04-10 DIAGNOSIS — E039 Hypothyroidism, unspecified: Secondary | ICD-10-CM | POA: Diagnosis not present

## 2017-04-10 DIAGNOSIS — M25551 Pain in right hip: Secondary | ICD-10-CM | POA: Diagnosis not present

## 2017-04-10 DIAGNOSIS — K210 Gastro-esophageal reflux disease with esophagitis: Secondary | ICD-10-CM | POA: Diagnosis not present

## 2017-04-10 DIAGNOSIS — R7301 Impaired fasting glucose: Secondary | ICD-10-CM | POA: Diagnosis not present

## 2017-04-10 DIAGNOSIS — I872 Venous insufficiency (chronic) (peripheral): Secondary | ICD-10-CM | POA: Diagnosis not present

## 2017-04-11 ENCOUNTER — Other Ambulatory Visit: Payer: Self-pay

## 2017-04-11 MED ORDER — TOPIRAMATE 50 MG PO TABS: ORAL_TABLET | ORAL | 3 refills | Status: DC

## 2017-05-02 DIAGNOSIS — R350 Frequency of micturition: Secondary | ICD-10-CM | POA: Diagnosis not present

## 2017-05-02 DIAGNOSIS — N952 Postmenopausal atrophic vaginitis: Secondary | ICD-10-CM | POA: Diagnosis not present

## 2017-05-02 DIAGNOSIS — S91301A Unspecified open wound, right foot, initial encounter: Secondary | ICD-10-CM | POA: Diagnosis not present

## 2017-05-22 DIAGNOSIS — M79671 Pain in right foot: Secondary | ICD-10-CM | POA: Diagnosis not present

## 2017-05-22 DIAGNOSIS — M25579 Pain in unspecified ankle and joints of unspecified foot: Secondary | ICD-10-CM | POA: Diagnosis not present

## 2017-05-26 ENCOUNTER — Ambulatory Visit (INDEPENDENT_AMBULATORY_CARE_PROVIDER_SITE_OTHER): Payer: Medicare Other | Admitting: Internal Medicine

## 2017-05-26 ENCOUNTER — Encounter: Payer: Self-pay | Admitting: Internal Medicine

## 2017-05-26 VITALS — BP 114/70 | HR 66 | Temp 97.4°F | Ht 68.0 in | Wt 177.6 lb

## 2017-05-26 DIAGNOSIS — K219 Gastro-esophageal reflux disease without esophagitis: Secondary | ICD-10-CM | POA: Diagnosis not present

## 2017-05-26 DIAGNOSIS — K5903 Drug induced constipation: Secondary | ICD-10-CM

## 2017-05-26 NOTE — Progress Notes (Signed)
Primary Care Physician:  Manon Hilding, MD Primary Gastroenterologist:  Dr. Gala Romney  Pre-Procedure History & Physical: HPI:  [name] is a 83 y.o. female here for GERD and constipation.  Chronic constipated. Has many places ago and she states and doesn't always take home a laxative medicine for constipation appearing that it will work "too well". Takes Benefiber only sporadically. MiraLAX caused abdominal cramps which she did not like. Lens S work but only taken sporadically previously.  Past Medical History:  Diagnosis Date  . Arthritis   . Chronic diastolic heart failure ([place])   . Diverticulosis of colon (without mention of hemorrhage)    TCS by Dr. Gala Romney  . GERD (gastroesophageal reflux disease)   . Hemorrhoids, internal    TCS by Dr. Gala Romney  . Irritable bowel syndrome   . Migraine   . Mitral regurgitation   . Peripheral polyneuropathy    Confirmed by EMG and nerve conduction velocities   . Pulmonary nodule   . Seizures ([place])    seizure in 2011  . Syncope    Neurocardiogenic - positive tilt table test, on [place]  . Tubular adenoma   . UTI (urinary tract infection)    frequent    Past Surgical History:  Procedure Laterality Date  . APPENDECTOMY    . Back Pain     going to chiropracator for back and hip pain  . BACK SURGERY     x2  . bladder tack    . CATARACT EXTRACTION, BILATERAL  2017  . CHOLECYSTECTOMY    . COLONOSCOPY  06/16/2005   internal hemorrhoids, L side diverticula - Dr. Gala Romney  . COLONOSCOPY N/A 05/02/2013   Dr. Gala Romney- normal rectum, scattered L sided diverticula. bx= tubular adenoma  . ESOPHAGOGASTRODUODENOSCOPY  12/21/2006     Dr. Vivi Ferns esophagus/Patulous EG junction, small to moderate sized hiatal hernia,   multiple fundal gland type appearing polyps biopsied, otherwise normal  . FOOT SURGERY     cyst removed  . TOTAL ABDOMINAL HYSTERECTOMY    . TOTAL HIP ARTHROPLASTY Right 01/11/2017   Procedure: RIGHT TOTAL HIP ARTHROPLASTY  ANTERIOR APPROACH;  Surgeon: Gaynelle Arabian, MD;  Location: [facility];  Service: Orthopedics;  Laterality: Right;  . WRIST SURGERY      Prior to Admission medications   Medication Sig Start Date End Date Taking? Authorizing Provider  aspirin EC 81 MG tablet Take 81 mg by mouth daily.   Yes [provider]  Cholecalciferol (VITAMIN D) 2000 units CAPS Take by mouth daily.   Yes [provider]  DEXILANT 60 MG capsule TAKE 1 CAPSULE DAILY 04/10/17  Yes Annitta Needs, NP  fexofenadine (ALLEGRA) 180 MG tablet Take 180 mg by mouth daily.   Yes [provider]  fludrocortisone (FLORINEF) 0.1 MG tablet TAKE 1 TABLET THREE TIMES A WEEK 04/10/17  Yes Satira Sark, MD  fluticasone [facility]) 50 MCG/ACT nasal spray Place 2 sprays into the nose as needed for allergies.    Yes [provider]  furosemide (LASIX) 20 MG tablet Take 1 tablet (20 mg total) by mouth daily. Pt states she does not take it on Sundays and when she has a doctor's appt. Patient taking differently: Take 20 mg by mouth daily.  09/07/15  Yes Satira Sark, MD  Lactobacillus (PROBIOTIC ACIDOPHILUS PO) Take by mouth daily.   Yes [provider]  levothyroxine (SYNTHROID, LEVOTHROID) 75 MCG tablet Take 75 mcg by mouth daily before breakfast.   Yes [provider]  mirabegron ER (MYRBETRIQ) 50 MG TB24 tablet Take 50 mg by mouth at bedtime.    Yes [provider]  montelukast (SINGULAIR) 10 MG tablet Take 10 mg by mouth at bedtime.    Yes [provider]  Multiple Vitamin (MULTIVITAMIN) tablet Take 1 tablet by mouth daily.   Yes [provider]  nitrofurantoin (MACRODANTIN) 50 MG capsule Take 50 mg by mouth at bedtime.   Yes [provider]  Omega-3 Fatty Acids (FISH OIL) 1200 MG CAPS Take by mouth daily.   Yes [provider]  Polyethyl Glycol-Propyl Glycol (SYSTANE OP) Apply 1 drop to eye daily.    Yes [provider]  polyethylene  glycol (MIRALAX / GLYCOLAX) packet Take 17 g by mouth at bedtime as needed for mild constipation.   Yes [provider]  polyvinyl alcohol (LIQUIFILM TEARS) 1.4 % ophthalmic solution Place 1 drop into both eyes as needed for dry eyes. 01/13/17  Yes Perkins, Alexzandrew L, PA-C  topiramate (TOPAMAX) 50 MG tablet Take 2 tabs bid 04/11/17  Yes Garvin Fila, MD  vitamin C (ASCORBIC ACID) 500 MG tablet Take 500 mg by mouth daily.   Yes [provider]  Wheat Dextrin (BENEFIBER DRINK MIX) PACK Take 1 Dose by mouth. 1 to 2 times daily as needed for constipation   Yes [provider]  HYDROmorphone (DILAUDID) 2 MG tablet Take 1-2 tablets (2-4 mg total) by mouth every 4 (four) hours as needed for moderate pain or severe pain. Patient not taking: Reported on 05/26/2017 01/13/17   Dara Lords, Alexzandrew L, PA-C  methocarbamol (ROBAXIN) 500 MG tablet Take 1 tablet (500 mg total) by mouth every 6 (six) hours as needed for muscle spasms. Patient not taking: Reported on 05/26/2017 01/13/17   Dara Lords, Alexzandrew L, PA-C  nitroGLYCERIN (NITROSTAT) 0.4 MG SL tablet Place 0.4 mg under the tongue every 5 (five) minutes as needed for chest pain. 09/12/12   Serpe, Burna Forts, PA-C  rivaroxaban (XARELTO) 10 MG TABS tablet Take 1 tablet (10 mg total) by mouth daily with breakfast. Take Xarelto for two and a half more weeks following discharge from the hospital, then discontinue Xarelto. Once the patient has completed the Xarelto, they may resume the 81 mg Aspirin. Patient not taking: Reported on 05/26/2017 01/13/17   Dara Lords, Alexzandrew L, PA-C  traMADol (ULTRAM) 50 MG tablet Take 1-2 tablets (50-100 mg total) by mouth every 6 (six) hours as needed (mild pain). Patient not taking: Reported on 05/26/2017 01/13/17   Dara Lords, Alexzandrew L, PA-C    Allergies as of 05/26/2017 - Review Complete 05/26/2017  Allergen Reaction Noted  . Cephalexin Anaphylaxis and Swelling 02/25/2009  . Doxazosin mesylate Other (See  Comments) 04/24/2013  . Levofloxacin Anaphylaxis and Rash 02/25/2009  . Codeine Other (See Comments) 02/25/2009  . Indomethacin Other (See Comments) 02/25/2009  . Ivp dye [iodinated diagnostic agents] Hives 04/24/2013  . Morphine Other (See Comments) 02/25/2009  . Sulfamethoxazole-trimethoprim Other (See Comments)   . Penicillins Other (See Comments) 02/25/2009    Family History  Problem Relation Age of Onset  . Stroke Father   . Colon cancer Neg Hx     Social History   Social History  . Marital status: Widowed    Spouse name: N/A  . Number of children: 4  . Years of education: college   Occupational History  .      Retired   Social History Main Topics  . Smoking status: Never Smoker  . Smokeless  tobacco: Never Used     Comment: tobacco use - no  . Alcohol use No  . Drug use: No  . Sexual activity: Not Currently   Other Topics Concern  . Not on file   Social History Narrative   Retired, widowed, education college education .   Right handed.   Caffeine one cup of coffee daily.     Review of Systems: See HPI, otherwise negative ROS  Physical Exam: BP 114/70   Pulse 66   Temp 97.4 F (36.3 C) (Oral)   Ht 5\' 8"  (1.727 m)   Wt 177 lb 9.6 oz (80.6 kg)   BMI 27.00 kg/m  General:   Alert,  pleasant and cooperative in NAD Skin:  Intact without significant lesions or rashes. Neck:  Supple; no masses or thyromegaly. No significant cervical adenopathy. Lungs:  Clear throughout to auscultation.   No wheezes, crackles, or rhonchi. No acute distress. Heart:  Regular rate and rhythm; no murmurs, clicks, rubs,  or gallops. Abdomen: Non-distended, normal bowel sounds.  Soft and nontender without appreciable mass or hepatosplenomegaly.  Pulses:  Normal pulses noted. Extremities:  Without clubbing or edema.  Impression:  Pleasant 83 year old lady with long-standing GERD well-controlled on Dexilant  60 mg daily. Chronic constipation. Sporadically on a bowel regimen. Not  taking enough fiber. MiraLAX did not agree with the patient.  Anticholinergic effects of associated medications may also be contributing to constipation.  Linzess worked well previously but taken sporadically. It's worth revisiting this line of therapy.   Recommendations:  Increase Benefiber to 2 teaspoons twice daily  No Miralax  Try Linzess 72 daily for constipation\  Continue Dexilant 60 mg daily  Call in 2 weeks and let us know how Linzess is working for you.  Office visit in 6 months     Notice: This dictation was prepared with Dragon dictation along with smaller phrase technology. Any transcriptional errors that result from this process are unintentional and may not be corrected upon review.

## 2017-05-26 NOTE — Patient Instructions (Signed)
Increase Benefiber to 2 teaspoons twice daily  No Miralax  Try Linzess 72 daily for constipation\  Continue Dexilant 60 mg daily  Call in 2 weeks and let us know how Linzess is working for you.  Office visit in 6 months

## 2017-05-29 ENCOUNTER — Ambulatory Visit: Payer: Medicare Other | Admitting: Neurology

## 2017-05-29 DIAGNOSIS — I739 Peripheral vascular disease, unspecified: Secondary | ICD-10-CM | POA: Diagnosis not present

## 2017-05-29 DIAGNOSIS — M25579 Pain in unspecified ankle and joints of unspecified foot: Secondary | ICD-10-CM | POA: Diagnosis not present

## 2017-06-06 DIAGNOSIS — R3915 Urgency of urination: Secondary | ICD-10-CM | POA: Diagnosis not present

## 2017-06-06 DIAGNOSIS — N3946 Mixed incontinence: Secondary | ICD-10-CM | POA: Diagnosis not present

## 2017-06-06 DIAGNOSIS — N952 Postmenopausal atrophic vaginitis: Secondary | ICD-10-CM | POA: Diagnosis not present

## 2017-06-06 DIAGNOSIS — R351 Nocturia: Secondary | ICD-10-CM | POA: Diagnosis not present

## 2017-06-06 DIAGNOSIS — N3281 Overactive bladder: Secondary | ICD-10-CM | POA: Diagnosis not present

## 2017-06-07 ENCOUNTER — Telehealth: Payer: Self-pay | Admitting: Internal Medicine

## 2017-06-07 NOTE — Telephone Encounter (Signed)
Pt called to give a report on how her Linzess samples were working for her. She said that she would try to send JL a message thru [facility] but would rather JL call her back at [phone]

## 2017-06-07 NOTE — Telephone Encounter (Signed)
Spoke with the pt, she said RMR gave her samples of linzess 10mcg on 05/26/17. She started taking them the next day, she did not have a bm all week, on the following Friday 06/02/17 she started having diarrhea and vomiting. Pt said she is not taking anymore linzess, she said she will take the Benefiber bid and dulcolax prn. She also went to see her urologist- Dr.Hurt and he said it was ok for her to stop her myrbetriq. She has stopped taking it for now. bm's are ok so far.  Pt is going to call back if these measures are not helping.  Routing to RMR for FYI.

## 2017-06-07 NOTE — Telephone Encounter (Incomplete)
Spoke with the pt, she said RMR gave her samples of linzess 83mcg on 05/26/17. She started taking them the next day, she did not have a bm all week, on the following Friday 06/02/17 she started having diarrhea and vomiting. Pt said she is not taking anymore linzess, she said she will take the Benefiber bid and dulcolax prn. She also went to see her urologist- Dr.

## 2017-06-08 NOTE — Telephone Encounter (Signed)
Communication noted. Agree with the patient's plan of management.

## 2017-06-15 DIAGNOSIS — Z471 Aftercare following joint replacement surgery: Secondary | ICD-10-CM | POA: Diagnosis not present

## 2017-06-15 DIAGNOSIS — Z96641 Presence of right artificial hip joint: Secondary | ICD-10-CM | POA: Diagnosis not present

## 2017-06-15 DIAGNOSIS — M25651 Stiffness of right hip, not elsewhere classified: Secondary | ICD-10-CM | POA: Diagnosis not present

## 2017-06-20 ENCOUNTER — Ambulatory Visit: Payer: Medicare Other | Admitting: Neurology

## 2017-06-22 ENCOUNTER — Ambulatory Visit (INDEPENDENT_AMBULATORY_CARE_PROVIDER_SITE_OTHER): Payer: Medicare Other | Admitting: Neurology

## 2017-06-22 ENCOUNTER — Encounter: Payer: Self-pay | Admitting: Neurology

## 2017-06-22 VITALS — BP 92/59 | HR 62 | Wt 179.2 lb

## 2017-06-22 DIAGNOSIS — G629 Polyneuropathy, unspecified: Secondary | ICD-10-CM

## 2017-06-22 NOTE — Patient Instructions (Signed)
I had a long discussion with the patient with regards to her remote seizure several neuropathy which appears stable. Her headaches appear to be mild tension headaches. I recommend she increase parts patient in activities for stress laxation like regular exercise, swimming, medication and yoga. In general Topamax and the current dose of 100 mg in the morning and 150 mg at night to help with seizures,neuropathic painn as well as headaches. I also advised her to start using Aspercreme local application on her feet for her neuropathic pain as well. She was advised to return for follow-up in the future in 6 months with Ward Givens, nurse practitioner call earlier if necessary

## 2017-06-22 NOTE — Progress Notes (Signed)
PATIENT: [name] DOB: 10-Mar-1930  REASON FOR VISIT: follow up- sensory neuropathy, seizures, headache HISTORY FROM: patient  HISTORY OF PRESENT ILLNESS: Ms. [name] is an 83 year old female with a history of sensory neuropathy, seizures and headache. She returns today for follow-up. Overall she has been doing well. She states that she is not had any additional seizures since stopping gabapentin. She does state that she has been more sleepy since increasing her Topamax. She states that she continues to have headaches but not severe headaches. Even with a dull headache she does not take medication. She does have neuropathy in the feet. She states Aspercreme has offered her good benefit but she does not use it consistently. She recently had cataract surgery. She denies any falls but does feel off balance at times. She returns today for an evaluation.  HISTORY:  Ms. [name] is an 83 year old female with a history of sensory neuropathy, seizures and headache. She returns today complaining of ongoing headache. Last week the patient presented to [facility] emergency room for sharp shooting pains in the head. She had a CT of the head completed that was normal. She was given a prescription for carbamazepine but decided not to take this medication. She states that she continues to have a headache. However now she states her headache is located "all over" she rates her pain a 5 out of 10. Denies any sharp shooting pains. Denies nausea and vomiting. Denies photophobia and phonophobia. Denies any new medical issues. She returns today for an evaluation.  HISTORY  08/17/15: Ms. [name] is an 83 year old female with a history of sensory neuropathy and seizures. She returns today for follow-up. She continues to take Topamax and gabapentin. At the last visit it was recommended that she try Aspercreme for her paresthesias in the feet. She states that this has been beneficial. She still feels that she has  a staggering gait. She only uses a cane at bedtime. She does not want to use it during the day. She denies any falls. She does report that in the last 2-3 weeks she's been having "partial numbness" on the right side of the face near the eye in the temporal region. She states that this is accompanied by a dull discomfort. She states that she has been under more stress in the last several weeks due to a death in the family. She is unsure if stress is causing the symptoms. She states that she does have some changes in her vision when she is driving although her description is very vague. She plans to make an appointment with her ophthalmologist. Denies any weakness. Denies a facial droop. She is unsure how long the "partial numbness" and dull discomfort last. She is able to tell me that she usually wakes up with it in the middle the night. Patient is having a hard time describing her symptoms in detail. She denies any seizure events. She states that she is only having 1 seizure event in her lifetime and at that time she was taken to the hospital and does not recall what happened. She returns today for an evaluation:  Update 11/26/2015 : She returns for follow-up to complete by daughter after last visit 3 months ago. She continues to have headaches off and on and Topamax seems to help. The headache frequency is quite variable. She seems to tolerating Topamax without significant side effects. She continues to have paresthesias in her feet as well as she is more recently bothered by swelling in the  right foot and pain. She saw her primary physician who ordered x-rays which did not reveal any fracture. She also had lower extremity venous Dopplers which were negative for DVT. She saw a podiatrist who thought she had inflamed tendons. She's also had pain in the hip for which she plans to see her orthopedic doctor tomorrow. She takes gabapentin 200 mg at night and wonders if she needs it. She's not had a seizure for now 4-5  years. Update 06/22/2017 : She returns for follow-up after last visit with Braulio Bosch, nurse practitioner in June 2017. She continues to do well and has not had seizure now as far as long back she can remember. She does complain of mild daily headaches which are bifrontal 2/10 severity. Not a complaint bad nausea light or sound sensitivity. Headaches are not disabling. She continues to have some paresthesias in her feet from her neuropathy. She takes Topamax 100 mg the morning and 150 at night and seemed to be tolerating it well without any side effects. She is able to sleep well at night. She had right hip replacement on 06/15/17 by Dr. Cooper Render which went well and she is walking better now. She is also seen by gastroenterologist last month. She has not been participating in any activities for stress relaxation. REVIEW OF SYSTEMS: Out of a complete 14 system review of symptoms, the patient complains only of the following symptoms, and all other reviewed systems are negative.  Fatigue,  unexpected weight change, hearing loss, and drooling, light sensitivity, cough, chest pain, leg swelling, constipation, incontinence, frequency of urination, bladder urgency, joint and back pain, aching muscles, walking difficulty, dizziness, headache, easy bruising and weakness and all other systems negative  ALLERGIES: Allergies  Allergen Reactions  . Cephalexin Anaphylaxis and Swelling  . Doxazosin Mesylate Other (See Comments)    Made patient EXTREMELY sick.   . Levofloxacin Anaphylaxis and Rash  . Codeine Other (See Comments)    Hallucinations  . Indomethacin Other (See Comments)    Severe Headache   . Ivp Dye [Iodinated Diagnostic Agents] Hives  . Morphine Other (See Comments)    Hallucinations   . Sulfamethoxazole-Trimethoprim Other (See Comments)    Unknown   . Penicillins Other (See Comments)    Made patient go into shock Has patient had a PCN reaction causing immediate rash, facial/tongue/throat  swelling, SOB or lightheadedness with hypotension: Yes Has patient had a PCN reaction causing severe rash involving mucus membranes or skin necrosis: No Has patient had a PCN reaction that required hospitalization No Has patient had a PCN reaction occurring within the last 10 years: No If all of the above answers are "NO", then may proceed with Cephalosporin use.     HOME MEDICATIONS: Outpatient Medications Prior to Visit  Medication Sig Dispense Refill  . aspirin EC 81 MG tablet Take 81 mg by mouth daily.    . Cholecalciferol (VITAMIN D) 2000 units CAPS Take by mouth daily.    Marland Kitchen DEXILANT 60 MG capsule TAKE 1 CAPSULE DAILY 90 capsule 2  . fexofenadine (ALLEGRA) 180 MG tablet Take 180 mg by mouth daily.    . fludrocortisone (FLORINEF) 0.1 MG tablet TAKE 1 TABLET THREE TIMES A WEEK 36 tablet 3  . fluticasone (FLONASE) 50 MCG/ACT nasal spray Place 2 sprays into the nose as needed for allergies.     . furosemide (LASIX) 20 MG tablet Take 1 tablet (20 mg total) by mouth daily. Pt states she does not take it on Sundays and when  she has a doctor's appt. (Patient taking differently: Take 20 mg by mouth daily. ) 90 tablet 3  . Lactobacillus (PROBIOTIC ACIDOPHILUS PO) Take by mouth daily.    Marland Kitchen levothyroxine (SYNTHROID, LEVOTHROID) 75 MCG tablet Take 75 mcg by mouth daily before breakfast.    . methocarbamol (ROBAXIN) 500 MG tablet Take 1 tablet (500 mg total) by mouth every 6 (six) hours as needed for muscle spasms. 80 tablet 0  . montelukast (SINGULAIR) 10 MG tablet Take 10 mg by mouth at bedtime.     . Multiple Vitamin (MULTIVITAMIN) tablet Take 1 tablet by mouth daily.    . nitrofurantoin (MACRODANTIN) 50 MG capsule Take 50 mg by mouth at bedtime.    . nitroGLYCERIN (NITROSTAT) 0.4 MG SL tablet Place 0.4 mg under the tongue every 5 (five) minutes as needed for chest pain.    . Omega-3 Fatty Acids (FISH OIL) 1200 MG CAPS Take by mouth daily.    Vladimir Faster Glycol-Propyl Glycol (SYSTANE OP) Apply 1  drop to eye daily.     . polyvinyl alcohol (LIQUIFILM TEARS) 1.4 % ophthalmic solution Place 1 drop into both eyes as needed for dry eyes. 15 mL 0  . topiramate (TOPAMAX) 50 MG tablet Take 2 tabs bid (Patient taking differently: 2 (two) times daily. Take 2 tabs bid) 180 tablet 3  . traMADol (ULTRAM) 50 MG tablet Take 1-2 tablets (50-100 mg total) by mouth every 6 (six) hours as needed (mild pain). 56 tablet 0  . vitamin C (ASCORBIC ACID) 500 MG tablet Take 500 mg by mouth daily.    . Wheat Dextrin (BENEFIBER DRINK MIX) PACK Take 1 Dose by mouth. 1 to 2 times daily as needed for constipation    . HYDROmorphone (DILAUDID) 2 MG tablet Take 1-2 tablets (2-4 mg total) by mouth every 4 (four) hours as needed for moderate pain or severe pain. (Patient not taking: Reported on 06/22/2017) 84 tablet 0  . mirabegron ER (MYRBETRIQ) 50 MG TB24 tablet Take 50 mg by mouth at bedtime.     . polyethylene glycol (MIRALAX / GLYCOLAX) packet Take 17 g by mouth at bedtime as needed for mild constipation.    . rivaroxaban (XARELTO) 10 MG TABS tablet Take 1 tablet (10 mg total) by mouth daily with breakfast. Take Xarelto for two and a half more weeks following discharge from the hospital, then discontinue Xarelto. Once the patient has completed the Xarelto, they may resume the 81 mg Aspirin. (Patient not taking: Reported on 06/22/2017) 18 tablet 0   No facility-administered medications prior to visit.     PAST MEDICAL HISTORY: Past Medical History:  Diagnosis Date  . Arthritis   . Chronic diastolic heart failure ([place])   . Diverticulosis of colon (without mention of hemorrhage)    TCS by Dr. Gala Romney  . GERD (gastroesophageal reflux disease)   . Hemorrhoids, internal    TCS by Dr. Gala Romney  . Irritable bowel syndrome   . Migraine   . Mitral regurgitation   . Peripheral polyneuropathy    Confirmed by EMG and nerve conduction velocities   . Pulmonary nodule   . Seizures ([place])    seizure in 2011  . Syncope     Neurocardiogenic - positive tilt table test, on [place]inef  . Tubular adenoma   . UTI (urinary tract infection)    frequent    PAST SURGICAL HISTORY: Past Surgical History:  Procedure Laterality Date  . APPENDECTOMY    . Back Pain  going to chiropracator for back and hip pain  . BACK SURGERY     x2  . bladder tack    . CATARACT EXTRACTION, BILATERAL  2017  . CHOLECYSTECTOMY    . COLONOSCOPY  06/16/2005   internal hemorrhoids, L side diverticula - Dr. Gala Romney  . COLONOSCOPY N/A 05/02/2013   Dr. Gala Romney- normal rectum, scattered L sided diverticula. bx= tubular adenoma  . ESOPHAGOGASTRODUODENOSCOPY  12/21/2006     Dr. Vivi Ferns esophagus/Patulous EG junction, small to moderate sized hiatal hernia,   multiple fundal gland type appearing polyps biopsied, otherwise normal  . FOOT SURGERY     cyst removed  . TOTAL ABDOMINAL HYSTERECTOMY    . TOTAL HIP ARTHROPLASTY Right 01/11/2017   Procedure: RIGHT TOTAL HIP ARTHROPLASTY ANTERIOR APPROACH;  Surgeon: Gaynelle Arabian, MD;  Location: [facility];  Service: Orthopedics;  Laterality: Right;  . WRIST SURGERY      FAMILY HISTORY: Family History  Problem Relation Age of Onset  . Stroke Father   . Colon cancer Neg Hx     SOCIAL HISTORY: Social History   Social History  . Marital status: Widowed    Spouse name: N/A  . Number of children: 4  . Years of education: college   Occupational History  .      Retired   Social History Main Topics  . Smoking status: Never Smoker  . Smokeless tobacco: Never Used     Comment: tobacco use - no  . Alcohol use No  . Drug use: No  . Sexual activity: Not Currently   Other Topics Concern  . Not on file   Social History Narrative   Retired, widowed, education college education .   Right handed.   Caffeine one cup of coffee daily.       PHYSICAL EXAM  Vitals:   06/22/17 1042  BP: (!) 92/59  Pulse: 62  Weight: 179 lb 3.2 oz (81.3 kg)   Body mass index is 27.25 kg/m.  Generalized:  Frail elderly lady in no acute distress   . Afebrile. Head is nontraumatic. Neck is supple without bruit.    Cardiac exam no murmur or gallop. Lungs are clear to auscultation. Distal pulses are well felt. Neurological examination  Mentation: Alert oriented to time, place, history taking. Follows all commands speech and language fluent Cranial nerve II-XII: Pupils were equal round reactive to light. Extraocular movements were full, visual field were full on confrontational test. Facial sensation and strength were normal. Uvula tongue midline. Head turning and shoulder shrug  were normal and symmetric.Hearing is diminished bilaterally. Motor: The motor testing reveals 5 over 5 strength of all 4 extremities. Good symmetric motor tone is noted throughout.  Sensory: Sensory testing is intact to soft touch on all 4 extremities. Pinprick sensation decreased in the lower extremities in a stocking-like pattern with some hyperesthesia to touch. No evidence of extinction is noted.  Coordination: Cerebellar testing reveals good finger-nose-finger and heel-to-shin bilaterally.  Gait and station: Gait is slightly wide-based with a stooped posture. Tandem gait not attempted. Reflexes: Deep tendon reflexes are symmetric and normal bilaterally.   DIAGNOSTIC DATA (LABS, IMAGING, TESTING) - I reviewed patient records, labs, notes, testing and imaging myself where available.      ASSESSMENT AND PLAN 83 y.o. year old female  has a past medical history of Arthritis; Chronic diastolic heart failure ([place]); Diverticulosis of colon (without mention of hemorrhage); GERD (gastroesophageal reflux disease); Hemorrhoids, internal; Irritable bowel syndrome; Migraine; Mitral regurgitation; Peripheral polyneuropathy; Pulmonary nodule;  Seizures ([place]); Syncope; Tubular adenoma; and UTI (urinary tract infection). here with:  1. Seizures 2. Sensory neuropathy 3. Chronic Headaches- tension  I had a long discussion with the  patient with regards to her remote seizure several neuropathy which appears stable. Her headaches appear to be mild tension headaches. I recommend she increase parts patient in activities for stress laxation like regular exercise, swimming, medication and yoga. In general Topamax and the current dose of 100 mg in the morning and 150 mg at night to help with seizures,neuropathic painn as well as headaches. I also advised her to start using Aspercreme local application on her feet for her neuropathic pain as well. I advised her to increase participation in activities for stress relaxation like regular exercise, swimming, medication and yoga She was advised to return for follow-up in the future in 6 months with Ward Givens, nurse practitioner call earlier if necessary. Greater than 50% time during this 25 minute visit was spent on counseling and coordination of care about her tension headaches, remote seizures, neuropathic pain and answering questions. Antony Contras, MD 06/22/2017, 5:28 PM [facility] [address] [place],  [address] [phone]

## 2017-07-03 DIAGNOSIS — M79672 Pain in left foot: Secondary | ICD-10-CM | POA: Diagnosis not present

## 2017-07-03 DIAGNOSIS — M79671 Pain in right foot: Secondary | ICD-10-CM | POA: Diagnosis not present

## 2017-07-03 DIAGNOSIS — M25579 Pain in unspecified ankle and joints of unspecified foot: Secondary | ICD-10-CM | POA: Diagnosis not present

## 2017-07-04 DIAGNOSIS — R3915 Urgency of urination: Secondary | ICD-10-CM | POA: Diagnosis not present

## 2017-07-04 DIAGNOSIS — Z87440 Personal history of urinary (tract) infections: Secondary | ICD-10-CM | POA: Diagnosis not present

## 2017-07-04 DIAGNOSIS — N3946 Mixed incontinence: Secondary | ICD-10-CM | POA: Diagnosis not present

## 2017-07-04 DIAGNOSIS — N952 Postmenopausal atrophic vaginitis: Secondary | ICD-10-CM | POA: Diagnosis not present

## 2017-07-04 DIAGNOSIS — N3281 Overactive bladder: Secondary | ICD-10-CM | POA: Diagnosis not present

## 2017-07-10 DIAGNOSIS — Z961 Presence of intraocular lens: Secondary | ICD-10-CM | POA: Diagnosis not present

## 2017-07-17 DIAGNOSIS — M2041 Other hammer toe(s) (acquired), right foot: Secondary | ICD-10-CM | POA: Diagnosis not present

## 2017-07-17 DIAGNOSIS — M79674 Pain in right toe(s): Secondary | ICD-10-CM | POA: Diagnosis not present

## 2017-07-17 DIAGNOSIS — M79671 Pain in right foot: Secondary | ICD-10-CM | POA: Diagnosis not present

## 2017-08-01 DIAGNOSIS — J209 Acute bronchitis, unspecified: Secondary | ICD-10-CM | POA: Diagnosis not present

## 2017-08-01 DIAGNOSIS — G40909 Epilepsy, unspecified, not intractable, without status epilepticus: Secondary | ICD-10-CM | POA: Diagnosis not present

## 2017-08-01 DIAGNOSIS — Z6827 Body mass index (BMI) 27.0-27.9, adult: Secondary | ICD-10-CM | POA: Diagnosis not present

## 2017-08-01 DIAGNOSIS — R5383 Other fatigue: Secondary | ICD-10-CM | POA: Diagnosis not present

## 2017-08-01 DIAGNOSIS — R05 Cough: Secondary | ICD-10-CM | POA: Diagnosis not present

## 2017-08-08 DIAGNOSIS — N952 Postmenopausal atrophic vaginitis: Secondary | ICD-10-CM | POA: Diagnosis not present

## 2017-08-08 DIAGNOSIS — Z87440 Personal history of urinary (tract) infections: Secondary | ICD-10-CM | POA: Diagnosis not present

## 2017-08-08 DIAGNOSIS — N3941 Urge incontinence: Secondary | ICD-10-CM | POA: Diagnosis not present

## 2017-08-22 DIAGNOSIS — M2042 Other hammer toe(s) (acquired), left foot: Secondary | ICD-10-CM | POA: Diagnosis not present

## 2017-08-22 DIAGNOSIS — M79672 Pain in left foot: Secondary | ICD-10-CM | POA: Diagnosis not present

## 2017-08-29 DIAGNOSIS — M7551 Bursitis of right shoulder: Secondary | ICD-10-CM | POA: Diagnosis not present

## 2017-08-29 DIAGNOSIS — M67431 Ganglion, right wrist: Secondary | ICD-10-CM | POA: Diagnosis not present

## 2017-08-29 DIAGNOSIS — Z6827 Body mass index (BMI) 27.0-27.9, adult: Secondary | ICD-10-CM | POA: Diagnosis not present

## 2017-09-05 DIAGNOSIS — N3281 Overactive bladder: Secondary | ICD-10-CM | POA: Diagnosis not present

## 2017-09-05 DIAGNOSIS — R3915 Urgency of urination: Secondary | ICD-10-CM | POA: Diagnosis not present

## 2017-09-05 DIAGNOSIS — N3946 Mixed incontinence: Secondary | ICD-10-CM | POA: Diagnosis not present

## 2017-09-05 DIAGNOSIS — Z87440 Personal history of urinary (tract) infections: Secondary | ICD-10-CM | POA: Diagnosis not present

## 2017-09-05 DIAGNOSIS — R3129 Other microscopic hematuria: Secondary | ICD-10-CM | POA: Diagnosis not present

## 2017-09-05 DIAGNOSIS — N952 Postmenopausal atrophic vaginitis: Secondary | ICD-10-CM | POA: Diagnosis not present

## 2017-09-05 DIAGNOSIS — N3941 Urge incontinence: Secondary | ICD-10-CM | POA: Diagnosis not present

## 2017-09-11 DIAGNOSIS — N390 Urinary tract infection, site not specified: Secondary | ICD-10-CM | POA: Diagnosis not present

## 2017-09-12 DIAGNOSIS — N390 Urinary tract infection, site not specified: Secondary | ICD-10-CM | POA: Diagnosis not present

## 2017-09-13 DIAGNOSIS — N390 Urinary tract infection, site not specified: Secondary | ICD-10-CM | POA: Diagnosis not present

## 2017-09-19 DIAGNOSIS — R3915 Urgency of urination: Secondary | ICD-10-CM | POA: Diagnosis not present

## 2017-09-19 DIAGNOSIS — N952 Postmenopausal atrophic vaginitis: Secondary | ICD-10-CM | POA: Diagnosis not present

## 2017-09-19 DIAGNOSIS — Z87440 Personal history of urinary (tract) infections: Secondary | ICD-10-CM | POA: Diagnosis not present

## 2017-09-19 DIAGNOSIS — R351 Nocturia: Secondary | ICD-10-CM | POA: Diagnosis not present

## 2017-09-19 DIAGNOSIS — N3946 Mixed incontinence: Secondary | ICD-10-CM | POA: Diagnosis not present

## 2017-09-23 ENCOUNTER — Other Ambulatory Visit: Payer: Self-pay | Admitting: Neurology

## 2017-10-03 DIAGNOSIS — R7301 Impaired fasting glucose: Secondary | ICD-10-CM | POA: Diagnosis not present

## 2017-10-03 DIAGNOSIS — G40909 Epilepsy, unspecified, not intractable, without status epilepticus: Secondary | ICD-10-CM | POA: Diagnosis not present

## 2017-10-03 DIAGNOSIS — E039 Hypothyroidism, unspecified: Secondary | ICD-10-CM | POA: Diagnosis not present

## 2017-10-03 DIAGNOSIS — R5383 Other fatigue: Secondary | ICD-10-CM | POA: Diagnosis not present

## 2017-10-03 DIAGNOSIS — R350 Frequency of micturition: Secondary | ICD-10-CM | POA: Diagnosis not present

## 2017-10-03 DIAGNOSIS — N3946 Mixed incontinence: Secondary | ICD-10-CM | POA: Diagnosis not present

## 2017-10-03 DIAGNOSIS — R3915 Urgency of urination: Secondary | ICD-10-CM | POA: Diagnosis not present

## 2017-10-03 DIAGNOSIS — N3281 Overactive bladder: Secondary | ICD-10-CM | POA: Diagnosis not present

## 2017-10-03 DIAGNOSIS — R42 Dizziness and giddiness: Secondary | ICD-10-CM | POA: Diagnosis not present

## 2017-10-03 DIAGNOSIS — N952 Postmenopausal atrophic vaginitis: Secondary | ICD-10-CM | POA: Diagnosis not present

## 2017-10-03 DIAGNOSIS — K210 Gastro-esophageal reflux disease with esophagitis: Secondary | ICD-10-CM | POA: Diagnosis not present

## 2017-10-10 ENCOUNTER — Ambulatory Visit (INDEPENDENT_AMBULATORY_CARE_PROVIDER_SITE_OTHER): Payer: Medicare Other

## 2017-10-10 VITALS — BP 130/78 | HR 58

## 2017-10-10 DIAGNOSIS — I5032 Chronic diastolic (congestive) heart failure: Secondary | ICD-10-CM

## 2017-10-10 DIAGNOSIS — R7301 Impaired fasting glucose: Secondary | ICD-10-CM | POA: Diagnosis not present

## 2017-10-10 DIAGNOSIS — M25551 Pain in right hip: Secondary | ICD-10-CM | POA: Diagnosis not present

## 2017-10-10 DIAGNOSIS — R5383 Other fatigue: Secondary | ICD-10-CM | POA: Diagnosis not present

## 2017-10-10 DIAGNOSIS — Z0001 Encounter for general adult medical examination with abnormal findings: Secondary | ICD-10-CM | POA: Diagnosis not present

## 2017-10-10 DIAGNOSIS — Z6827 Body mass index (BMI) 27.0-27.9, adult: Secondary | ICD-10-CM | POA: Diagnosis not present

## 2017-10-10 DIAGNOSIS — K210 Gastro-esophageal reflux disease with esophagitis: Secondary | ICD-10-CM | POA: Diagnosis not present

## 2017-10-10 DIAGNOSIS — I872 Venous insufficiency (chronic) (peripheral): Secondary | ICD-10-CM | POA: Diagnosis not present

## 2017-10-10 DIAGNOSIS — G40909 Epilepsy, unspecified, not intractable, without status epilepticus: Secondary | ICD-10-CM | POA: Diagnosis not present

## 2017-10-10 DIAGNOSIS — E039 Hypothyroidism, unspecified: Secondary | ICD-10-CM | POA: Diagnosis not present

## 2017-10-10 DIAGNOSIS — J069 Acute upper respiratory infection, unspecified: Secondary | ICD-10-CM | POA: Diagnosis not present

## 2017-10-10 DIAGNOSIS — I951 Orthostatic hypotension: Secondary | ICD-10-CM | POA: Diagnosis not present

## 2017-10-10 MED ORDER — NITROGLYCERIN 0.4 MG SL SUBL: 0.4000 mg | SUBLINGUAL_TABLET | SUBLINGUAL | 3 refills | Status: DC | PRN

## 2017-10-10 NOTE — Progress Notes (Signed)
Patient came into office today for an EKG check. Patient stated on Thursday night she had severe chest pain, but thought it was gas so she did not go to the hospital. Patient stated she also had tingling in both hands. Patient stated she has had lingering chest pain since then. Patient states pain is just at a 2/10. More annoying rather than pain. Patient was seen in Dr. Franchot Gallo office today for a physical, and Dr. Quintin Alto wanted patient to come into office today to have an EKG. EKG in epic. BP was 130/78 HR 58. Medications reviewed with patient and are up to date. Patient did not have any nitroglycerin at home. Will refill. Will send to Dr. Domenic Polite to review. Advised patient if she experienced severe chest pain again she needs to go to the ER.

## 2017-10-10 NOTE — Progress Notes (Signed)
I reviewed her ECG. She has sinus rhythm with PACs, these were noted previously. No acute ST segment changes. She does not have any documented CAD history, we are following her for chronic diastolic heart failure and previous neurocardiogenic syncope. If she continues to have significant chest pain symptoms, I would agree with ER evaluation.

## 2017-10-16 DIAGNOSIS — M25512 Pain in left shoulder: Secondary | ICD-10-CM | POA: Diagnosis not present

## 2017-10-16 DIAGNOSIS — M7552 Bursitis of left shoulder: Secondary | ICD-10-CM | POA: Diagnosis not present

## 2017-10-18 ENCOUNTER — Other Ambulatory Visit: Payer: Self-pay

## 2017-10-18 ENCOUNTER — Telehealth: Payer: Self-pay | Admitting: Neurology

## 2017-10-18 MED ORDER — TOPIRAMATE 50 MG PO TABS: 50.0000 mg | ORAL_TABLET | Freq: Two times a day (BID) | ORAL | 0 refills | Status: DC

## 2017-10-18 MED ORDER — TOPIRAMATE 50 MG PO TABS: ORAL_TABLET | ORAL | 3 refills | Status: DC

## 2017-10-18 NOTE — Telephone Encounter (Signed)
Rn call patient that in April 2018 she was given 3 month supply with 3 refills to last 03/2018.

## 2017-10-18 NOTE — Telephone Encounter (Signed)
Pt wanted a 5 day supply until her mail order comes in. Five day supply sent to [facility] in [place].

## 2017-10-18 NOTE — Telephone Encounter (Signed)
Pt states she just realized she is down to about 3 days worth of the topiramate (TOPAMAX) 50 MG tablet.  Pt asking that  [place], [place] [phone] (Phone) [phone] (Fax)   Be contacted to get ordered as quickly as possible for pt.  Please call

## 2017-10-18 NOTE — Telephone Encounter (Signed)
LEft vm that [facility] had a paper refill. Pt was seen by MD in July 2018 and he increase her med. Pt is to take 100 in the am, and 150 at night. Rn left message that refill was done for a year.

## 2017-10-18 NOTE — Telephone Encounter (Signed)
Left vm for patients daughter that pt has a year of refills till 03/2018.

## 2017-10-18 NOTE — Telephone Encounter (Signed)
[facility] stated there was a paper refill sent in with one refill. Refill done for a year.

## 2017-10-24 DIAGNOSIS — R300 Dysuria: Secondary | ICD-10-CM | POA: Diagnosis not present

## 2017-10-26 DIAGNOSIS — R42 Dizziness and giddiness: Secondary | ICD-10-CM | POA: Diagnosis not present

## 2017-10-26 DIAGNOSIS — R270 Ataxia, unspecified: Secondary | ICD-10-CM | POA: Diagnosis not present

## 2017-10-26 DIAGNOSIS — E039 Hypothyroidism, unspecified: Secondary | ICD-10-CM | POA: Diagnosis not present

## 2017-10-26 DIAGNOSIS — N390 Urinary tract infection, site not specified: Secondary | ICD-10-CM | POA: Diagnosis not present

## 2017-10-26 DIAGNOSIS — G629 Polyneuropathy, unspecified: Secondary | ICD-10-CM | POA: Diagnosis not present

## 2017-10-26 DIAGNOSIS — R079 Chest pain, unspecified: Secondary | ICD-10-CM | POA: Diagnosis not present

## 2017-10-26 DIAGNOSIS — R0789 Other chest pain: Secondary | ICD-10-CM | POA: Diagnosis not present

## 2017-10-26 DIAGNOSIS — G40909 Epilepsy, unspecified, not intractable, without status epilepticus: Secondary | ICD-10-CM | POA: Diagnosis not present

## 2017-10-26 DIAGNOSIS — R569 Unspecified convulsions: Secondary | ICD-10-CM | POA: Diagnosis not present

## 2017-10-27 DIAGNOSIS — I951 Orthostatic hypotension: Secondary | ICD-10-CM | POA: Diagnosis present

## 2017-10-27 DIAGNOSIS — R0789 Other chest pain: Secondary | ICD-10-CM | POA: Diagnosis not present

## 2017-10-27 DIAGNOSIS — R51 Headache: Secondary | ICD-10-CM | POA: Diagnosis not present

## 2017-10-27 DIAGNOSIS — M1990 Unspecified osteoarthritis, unspecified site: Secondary | ICD-10-CM | POA: Diagnosis present

## 2017-10-27 DIAGNOSIS — Z87440 Personal history of urinary (tract) infections: Secondary | ICD-10-CM | POA: Diagnosis not present

## 2017-10-27 DIAGNOSIS — G40909 Epilepsy, unspecified, not intractable, without status epilepticus: Secondary | ICD-10-CM | POA: Diagnosis present

## 2017-10-27 DIAGNOSIS — G8929 Other chronic pain: Secondary | ICD-10-CM | POA: Diagnosis present

## 2017-10-27 DIAGNOSIS — Z888 Allergy status to other drugs, medicaments and biological substances status: Secondary | ICD-10-CM | POA: Diagnosis not present

## 2017-10-27 DIAGNOSIS — R42 Dizziness and giddiness: Secondary | ICD-10-CM | POA: Diagnosis not present

## 2017-10-27 DIAGNOSIS — G629 Polyneuropathy, unspecified: Secondary | ICD-10-CM | POA: Diagnosis not present

## 2017-10-27 DIAGNOSIS — K219 Gastro-esophageal reflux disease without esophagitis: Secondary | ICD-10-CM | POA: Diagnosis present

## 2017-10-27 DIAGNOSIS — R569 Unspecified convulsions: Secondary | ICD-10-CM | POA: Diagnosis not present

## 2017-10-27 DIAGNOSIS — R079 Chest pain, unspecified: Secondary | ICD-10-CM | POA: Diagnosis not present

## 2017-10-27 DIAGNOSIS — E039 Hypothyroidism, unspecified: Secondary | ICD-10-CM | POA: Diagnosis present

## 2017-10-27 DIAGNOSIS — M549 Dorsalgia, unspecified: Secondary | ICD-10-CM | POA: Diagnosis present

## 2017-10-27 DIAGNOSIS — N390 Urinary tract infection, site not specified: Secondary | ICD-10-CM | POA: Diagnosis present

## 2017-10-27 DIAGNOSIS — Z886 Allergy status to analgesic agent status: Secondary | ICD-10-CM | POA: Diagnosis not present

## 2017-10-27 DIAGNOSIS — K449 Diaphragmatic hernia without obstruction or gangrene: Secondary | ICD-10-CM | POA: Diagnosis present

## 2017-10-27 DIAGNOSIS — R270 Ataxia, unspecified: Secondary | ICD-10-CM | POA: Diagnosis present

## 2017-11-07 DIAGNOSIS — I739 Peripheral vascular disease, unspecified: Secondary | ICD-10-CM | POA: Diagnosis not present

## 2017-11-10 DIAGNOSIS — K1121 Acute sialoadenitis: Secondary | ICD-10-CM | POA: Diagnosis not present

## 2017-11-10 DIAGNOSIS — Z6827 Body mass index (BMI) 27.0-27.9, adult: Secondary | ICD-10-CM | POA: Diagnosis not present

## 2017-11-10 DIAGNOSIS — R600 Localized edema: Secondary | ICD-10-CM | POA: Diagnosis not present

## 2017-11-16 DIAGNOSIS — R3915 Urgency of urination: Secondary | ICD-10-CM | POA: Diagnosis not present

## 2017-11-16 DIAGNOSIS — N952 Postmenopausal atrophic vaginitis: Secondary | ICD-10-CM | POA: Diagnosis not present

## 2017-11-16 DIAGNOSIS — N3946 Mixed incontinence: Secondary | ICD-10-CM | POA: Diagnosis not present

## 2017-11-16 DIAGNOSIS — N3281 Overactive bladder: Secondary | ICD-10-CM | POA: Diagnosis not present

## 2017-11-29 DIAGNOSIS — M940 Chondrocostal junction syndrome [Tietze]: Secondary | ICD-10-CM | POA: Diagnosis not present

## 2017-11-29 DIAGNOSIS — R600 Localized edema: Secondary | ICD-10-CM | POA: Diagnosis not present

## 2017-11-29 DIAGNOSIS — Z6827 Body mass index (BMI) 27.0-27.9, adult: Secondary | ICD-10-CM | POA: Diagnosis not present

## 2017-11-30 DIAGNOSIS — Z1231 Encounter for screening mammogram for malignant neoplasm of breast: Secondary | ICD-10-CM | POA: Diagnosis not present

## 2017-12-04 ENCOUNTER — Encounter: Payer: Self-pay | Admitting: Neurology

## 2017-12-04 ENCOUNTER — Ambulatory Visit (INDEPENDENT_AMBULATORY_CARE_PROVIDER_SITE_OTHER): Payer: Medicare Other | Admitting: Neurology

## 2017-12-04 VITALS — BP 130/70 | HR 68 | Wt 182.8 lb

## 2017-12-04 DIAGNOSIS — M792 Neuralgia and neuritis, unspecified: Secondary | ICD-10-CM

## 2017-12-04 MED ORDER — TOPIRAMATE 50 MG PO TABS: 100.0000 mg | ORAL_TABLET | Freq: Two times a day (BID) | ORAL | 0 refills | Status: DC

## 2017-12-04 NOTE — Patient Instructions (Signed)
I had a long discussion with the patient regarding her chronic foot pain and neuropathy. I recommend she increase her Topamax dose to 100 mg in the morning and 150 mg at night. If she can tolerate this and has no benefit may consider increasing further. She will return for follow-up in the future in 3 months with Ward Givens was practiced or call earlier if necessary, NP

## 2017-12-04 NOTE — Progress Notes (Signed)
PATIENT: [name] DOB: 10-Mar-1930  REASON FOR VISIT: follow up- sensory neuropathy, seizures, headache HISTORY FROM: patient  HISTORY OF PRESENT ILLNESS: Ms. [name] is an 83 year old female with a history of sensory neuropathy, seizures and headache. She returns today for follow-up. Overall she has been doing well. She states that she is not had any additional seizures since stopping gabapentin. She does state that she has been more sleepy since increasing her Topamax. She states that she continues to have headaches but not severe headaches. Even with a dull headache she does not take medication. She does have neuropathy in the feet. She states Aspercreme has offered her good benefit but she does not use it consistently. She recently had cataract surgery. She denies any falls but does feel off balance at times. She returns today for an evaluation.  HISTORY:  Ms. [name] is an 83 year old female with a history of sensory neuropathy, seizures and headache. She returns today complaining of ongoing headache. Last week the patient presented to [facility] emergency room for sharp shooting pains in the head. She had a CT of the head completed that was normal. She was given a prescription for carbamazepine but decided not to take this medication. She states that she continues to have a headache. However now she states her headache is located "all over" she rates her pain a 5 out of 10. Denies any sharp shooting pains. Denies nausea and vomiting. Denies photophobia and phonophobia. Denies any new medical issues. She returns today for an evaluation.  HISTORY  08/17/15: [name] is an 83 year old female with a history of sensory neuropathy and seizures. She returns today for follow-up. She continues to take Topamax and gabapentin. At the last visit it was recommended that she try Aspercreme for her paresthesias in the feet. She states that this has been beneficial. She still feels that she has  a staggering gait. She only uses a cane at bedtime. She does not want to use it during the day. She denies any falls. She does report that in the last 2-3 weeks she's been having "partial numbness" on the right side of the face near the eye in the temporal region. She states that this is accompanied by a dull discomfort. She states that she has been under more stress in the last several weeks due to a death in the family. She is unsure if stress is causing the symptoms. She states that she does have some changes in her vision when she is driving although her description is very vague. She plans to make an appointment with her ophthalmologist. Denies any weakness. Denies a facial droop. She is unsure how long the "partial numbness" and dull discomfort last. She is able to tell me that she usually wakes up with it in the middle the night. Patient is having a hard time describing her symptoms in detail. She denies any seizure events. She states that she is only having 1 seizure event in her lifetime and at that time she was taken to the hospital and does not recall what happened. She returns today for an evaluation:  Update 11/26/2015 : She returns for follow-up to complete by daughter after last visit 3 months ago. She continues to have headaches off and on and Topamax seems to help. The headache frequency is quite variable. She seems to tolerating Topamax without significant side effects. She continues to have paresthesias in her feet as well as she is more recently bothered by swelling in the  right foot and pain. She saw her primary physician who ordered x-rays which did not reveal any fracture. She also had lower extremity venous Dopplers which were negative for DVT. She saw a podiatrist who thought she had inflamed tendons. She's also had pain in the hip for which she plans to see her orthopedic doctor tomorrow. She takes gabapentin 200 mg at night and wonders if she needs it. She's not had a seizure for now 4-5  years. Update 06/22/2017 : She returns for follow-up after last visit with [name], nurse practitioner in June 2017. She continues to do well and has not had seizure now as far as long back she can remember. She does complain of mild daily headaches which are bifrontal 2/10 severity. Not a complaint bad nausea light or sound sensitivity. Headaches are not disabling. She continues to have some paresthesias in her feet from her neuropathy. She takes Topamax 100 mg the morning and 150 at night and seemed to be tolerating it well without any side effects. She is able to sleep well at night. She had right hip replacement on 06/15/17 by Dr. Cooper Render which went well and she is walking better now. She is also seen by gastroenterologist last month. She has not been participating in any activities for stress relaxation. Update 12/04/2017 : She returns for follow-up after last visit 5 months ago.  She complains of increasing foot swelling as well as pain.  She seen a primary physician.  She has been asked to elevate her legs.  She feels at times she cannot walk because the pain bothers her.  She is currently taking Topamax 100 mg in the morning and 100 mg at night and tolerating it well without muscle aches and pains.  She in fact had lower extremity venous Doppler done on 11/10/17 at [name] which showed no evidence of DVT.  Her primary physician Dr. Quintin Alto has advised her to use a adjustable bed where her feet can be elevated.  She was admitted from November 8-11 at [facility] for chest pain but workup was negative.  She has had no recurrent seizures.  She continues to have mild intermittent headaches but these are not as bothersome.  She has not been participating in any regular activities for stress relaxation. REVIEW OF SYSTEMS: Out of a complete 14 system review of symptoms, the patient complains only of the following symptoms, and all other reviewed systems are negative.  Fatigue, drooling,  shortness of breath, chest pain, leg swelling, constipation, incontinence of bowels, incontinence of bladder, painful urination, bladder urgency, joint pain and back pain, joint swelling, walking difficulty, rash, itching, dizziness, headache, weakness and all other systems negative  ALLERGIES: Allergies  Allergen Reactions  . Cephalexin Anaphylaxis and Swelling  . Doxazosin Mesylate Other (See Comments)    Made patient EXTREMELY sick.   . Levofloxacin Anaphylaxis and Rash  . Codeine Other (See Comments)    Hallucinations  . Indomethacin Other (See Comments)    Severe Headache   . Ivp Dye [Iodinated Diagnostic Agents] Hives  . Morphine Other (See Comments)    Hallucinations   . Sulfamethoxazole-Trimethoprim Other (See Comments)    Unknown   . Penicillins Other (See Comments)    Made patient go into shock Has patient had a PCN reaction causing immediate rash, facial/tongue/throat swelling, SOB or lightheadedness with hypotension: Yes Has patient had a PCN reaction causing severe rash involving mucus membranes or skin necrosis: No Has patient had a PCN reaction that required hospitalization  No Has patient had a PCN reaction occurring within the last 10 years: No If all of the above answers are "NO", then may proceed with Cephalosporin use.     HOME MEDICATIONS: Outpatient Medications Prior to Visit  Medication Sig Dispense Refill  . aspirin EC 81 MG tablet Take 81 mg by mouth daily.    . Cholecalciferol (VITAMIN D) 2000 units CAPS Take by mouth daily.    Marland Kitchen DEXILANT 60 MG capsule TAKE 1 CAPSULE DAILY 90 capsule 2  . ESTRACE VAGINAL 0.1 MG/GM vaginal cream     . fexofenadine (ALLEGRA) 180 MG tablet Take 180 mg by mouth daily.    . fludrocortisone (FLORINEF) 0.1 MG tablet TAKE 1 TABLET THREE TIMES A WEEK 36 tablet 3  . fluticasone (FLONASE) 50 MCG/ACT nasal spray Place 2 sprays into the nose as needed for allergies.     . furosemide (LASIX) 20 MG tablet Take 1 tablet (20 mg total) by  mouth daily. Pt states she does not take it on Sundays and when she has a doctor's appt. (Patient taking differently: Take 20 mg by mouth daily. ) 90 tablet 3  . Lactobacillus (PROBIOTIC ACIDOPHILUS PO) Take by mouth daily.    Marland Kitchen levothyroxine (SYNTHROID, LEVOTHROID) 75 MCG tablet Take 75 mcg by mouth daily before breakfast.    . methenamine (HIPREX) 1 g tablet Take 1 g by mouth 2 (two) times daily with a meal.    . montelukast (SINGULAIR) 10 MG tablet Take 10 mg by mouth at bedtime.     . Multiple Vitamin (MULTIVITAMIN) tablet Take 1 tablet by mouth daily.    . nitroGLYCERIN (NITROSTAT) 0.4 MG SL tablet Place 1 tablet (0.4 mg total) under the tongue every 5 (five) minutes as needed for chest pain. 25 tablet 3  . Omega-3 Fatty Acids (FISH OIL) 1200 MG CAPS Take by mouth daily.    Vladimir Faster Glycol-Propyl Glycol (SYSTANE OP) Apply 1 drop to eye daily.     . polyvinyl alcohol (LIQUIFILM TEARS) 1.4 % ophthalmic solution Place 1 drop into both eyes as needed for dry eyes. 15 mL 0  . vitamin C (ASCORBIC ACID) 500 MG tablet Take 500 mg by mouth daily.    . Wheat Dextrin (BENEFIBER DRINK MIX) PACK Take 1 Dose by mouth. 1 to 2 times daily as needed for constipation    . topiramate (TOPAMAX) 50 MG tablet Take 100 in the am and 150 at night 270 tablet 3  . topiramate (TOPAMAX) 50 MG tablet Take 1 tablet (50 mg total) by mouth 2 (two) times daily. 15 tablet 0  . methocarbamol (ROBAXIN) 500 MG tablet Take 1 tablet (500 mg total) by mouth every 6 (six) hours as needed for muscle spasms. (Patient not taking: Reported on 12/04/2017) 80 tablet 0  . nitrofurantoin (MACRODANTIN) 50 MG capsule Take 50 mg by mouth at bedtime.    . traMADol (ULTRAM) 50 MG tablet Take 1-2 tablets (50-100 mg total) by mouth every 6 (six) hours as needed (mild pain). (Patient not taking: Reported on 12/04/2017) 56 tablet 0   No facility-administered medications prior to visit.     PAST MEDICAL HISTORY: Past Medical History:    Diagnosis Date  . Arthritis   . Chronic diastolic heart failure ([place])   . Diverticulosis of colon (without mention of hemorrhage)    TCS by Dr. Gala Romney  . GERD (gastroesophageal reflux disease)   . Hemorrhoids, internal    TCS by Dr. Gala Romney  . Irritable bowel syndrome   .  Migraine   . Mitral regurgitation   . Peripheral polyneuropathy    Confirmed by EMG and nerve conduction velocities   . Pulmonary nodule   . Seizures ([place])    seizure in 2011  . Syncope    Neurocardiogenic - positive tilt table test, on Florinef  . Tubular adenoma   . UTI (urinary tract infection)    frequent    PAST SURGICAL HISTORY: Past Surgical History:  Procedure Laterality Date  . APPENDECTOMY    . Back Pain     going to chiropracator for back and hip pain  . BACK SURGERY     x2  . bladder tack    . CATARACT EXTRACTION, BILATERAL  2017  . CHOLECYSTECTOMY    . COLONOSCOPY  06/16/2005   internal hemorrhoids, L side diverticula - Dr. Gala Romney  . COLONOSCOPY N/A 05/02/2013   Dr. Gala Romney- normal rectum, scattered L sided diverticula. bx= tubular adenoma  . ESOPHAGOGASTRODUODENOSCOPY  12/21/2006     Dr. Vivi Ferns esophagus/Patulous EG junction, small to moderate sized hiatal hernia,   multiple fundal gland type appearing polyps biopsied, otherwise normal  . FOOT SURGERY     cyst removed  . TOTAL ABDOMINAL HYSTERECTOMY    . TOTAL HIP ARTHROPLASTY Right 01/11/2017   Procedure: RIGHT TOTAL HIP ARTHROPLASTY ANTERIOR APPROACH;  Surgeon: Gaynelle Arabian, MD;  Location: [facility];  Service: Orthopedics;  Laterality: Right;  . WRIST SURGERY      FAMILY HISTORY: Family History  Problem Relation Age of Onset  . Stroke Father   . Colon cancer Neg Hx     SOCIAL HISTORY: Social History   Socioeconomic History  . Marital status: Widowed    Spouse name: Not on file  . Number of children: 4  . Years of education: college  . Highest education level: Not on file  Social Needs  . Financial resource strain: Not on  file  . Food insecurity - worry: Not on file  . Food insecurity - inability: Not on file  . Transportation needs - medical: Not on file  . Transportation needs - non-medical: Not on file  Occupational History    Comment: Retired  Tobacco Use  . Smoking status: Never Smoker  . Smokeless tobacco: Never Used  . Tobacco comment: tobacco use - no  Substance and Sexual Activity  . Alcohol use: No    Alcohol/week: 0.0 oz  . Drug use: No  . Sexual activity: Not Currently  Other Topics Concern  . Not on file  Social History Narrative   Retired, widowed, education college education .   Right handed.   Caffeine one cup of coffee daily.       PHYSICAL EXAM  Vitals:   12/04/17 1500  BP: 130/70  Pulse: 68  Weight: 182 lb 12.8 oz (82.9 kg)   Body mass index is 27.79 kg/m.  Generalized: Frail elderly lady in no acute distress   . Afebrile. Head is nontraumatic. Neck is supple without bruit.    Cardiac exam no murmur or gallop. Lungs are clear to auscultation. Distal pulses are well [email or web address] pedal edema bilaterally Neurological examination  Mentation: Alert oriented to time, place, history taking. Follows all commands speech and language fluent Cranial nerve II-XII: Pupils were equal round reactive to light. Extraocular movements were full, visual field were full on confrontational test. Facial sensation and strength were normal. Uvula tongue midline. Head turning and shoulder shrug  were normal and symmetric.Hearing is diminished bilaterally. Motor: The motor testing reveals 5 over  5 strength of all 4 extremities. Good symmetric motor tone is noted throughout.  Sensory: Sensory testing is intact to soft touch on all 4 extremities. Pinprick sensation decreased in the lower extremities in a stocking-like pattern with some hyperesthesia to touch. No evidence of extinction is noted.  Coordination: Cerebellar testing reveals good finger-nose-finger and heel-to-shin bilaterally.  Gait and  station: Gait is slightly wide-based with a stooped posture. Tandem gait not attempted. Reflexes: Deep tendon reflexes are symmetric and normal bilaterally.   DIAGNOSTIC DATA (LABS, IMAGING, TESTING) - I reviewed patient records, labs, notes, testing and imaging myself where available.      ASSESSMENT AND PLAN 83 y.o. year old female  has a past medical history of Arthritis, Chronic diastolic heart failure ([place]), Diverticulosis of colon (without mention of hemorrhage), GERD (gastroesophageal reflux disease), Hemorrhoids, internal, Irritable bowel syndrome, Migraine, Mitral regurgitation, Peripheral polyneuropathy, Pulmonary nodule, Seizures ([place]), Syncope, Tubular adenoma, and UTI (urinary tract infection). here with:  1. Seizures 2. Sensory neuropathy 3. Chronic Headaches- tension type stable I had a long discussion with the patient regarding her chronic foot pain and neuropathy. I recommend she increase her Topamax dose to 100 mg in the morning and 150 mg at night. If she can tolerate this and has no benefit may consider increasing further. She will return for follow-up in the future in 3 months with Ward Givens was practiced or call earlier if necessary,  Greater than 50% time during this 25 minute visit was spent on counseling and coordination of care about her tension headaches, remote seizures, neuropathic pain and answering questions. Antony Contras, MD 12/04/2017, 6:51 PM [facility] [address] [place], [place] [address] [phone]

## 2017-12-07 ENCOUNTER — Other Ambulatory Visit: Payer: Self-pay

## 2017-12-07 ENCOUNTER — Telehealth: Payer: Self-pay | Admitting: Neurology

## 2017-12-07 ENCOUNTER — Other Ambulatory Visit: Payer: Self-pay | Admitting: Neurology

## 2017-12-07 MED ORDER — TOPIRAMATE 100 MG PO TABS: ORAL_TABLET | ORAL | 3 refills | Status: DC

## 2017-12-07 MED ORDER — TOPIRAMATE 100 MG PO TABS: ORAL_TABLET | ORAL | 2 refills | Status: DC

## 2017-12-07 NOTE — Telephone Encounter (Signed)
[name] with [facility] needs to verify dosage for topiramate (TOPAMAX) 50 MG tablet. NID#[id number].

## 2017-12-07 NOTE — Telephone Encounter (Addendum)
Rx for topamax for 100mg  tablets. Pt taking 200mg  in the am,and 300mg  at night.taking two tablets in the am, and 3 tablets at night.  Rx fax twice, and confirmed both times.

## 2017-12-07 NOTE — Telephone Encounter (Signed)
Rn spoke with [facility]ist at [facility]. Rn stated another rx will be sent with the appropriate directions.

## 2017-12-13 ENCOUNTER — Other Ambulatory Visit: Payer: Self-pay | Admitting: Neurology

## 2017-12-13 MED ORDER — TOPIRAMATE 50 MG PO TABS: ORAL_TABLET | ORAL | 2 refills | Status: DC

## 2017-12-15 DIAGNOSIS — Z87440 Personal history of urinary (tract) infections: Secondary | ICD-10-CM | POA: Diagnosis not present

## 2017-12-15 DIAGNOSIS — N3946 Mixed incontinence: Secondary | ICD-10-CM | POA: Diagnosis not present

## 2017-12-15 DIAGNOSIS — N952 Postmenopausal atrophic vaginitis: Secondary | ICD-10-CM | POA: Diagnosis not present

## 2017-12-15 DIAGNOSIS — R350 Frequency of micturition: Secondary | ICD-10-CM | POA: Diagnosis not present

## 2017-12-20 ENCOUNTER — Other Ambulatory Visit: Payer: Self-pay | Admitting: Gastroenterology

## 2018-01-01 ENCOUNTER — Ambulatory Visit: Payer: Medicare Other | Admitting: Neurology

## 2018-01-02 DIAGNOSIS — L570 Actinic keratosis: Secondary | ICD-10-CM | POA: Diagnosis not present

## 2018-01-02 DIAGNOSIS — L280 Lichen simplex chronicus: Secondary | ICD-10-CM | POA: Diagnosis not present

## 2018-01-03 NOTE — Progress Notes (Signed)
Cardiology Office Note  Date: 01/04/2018   ID: [name], DOB July 21, 1930, MRN [id number]  PCP: Manon Hilding, MD  Primary Cardiologist: Rozann Lesches, MD   Chief Complaint  Patient presents with  . Diastolic heart failure    History of Present Illness: [name] is an 85 y.o. female last seen in February 2018. She presents for a routine follow-up visit. States that she has intermittent leg swelling, usually left sided around the ankle, particularly when she is up on her feet a lot. She is also bothered by chronic neuropathy symptoms. She has Lasix, but does not take it with any regularity. She also reports dyspnea on exertion. Her weight is up about 5 pounds of her period of several months.  Echocardiogram from January of last year showed LVEF 55-60% with normal diastolic function. She did have mild to moderate mitral and tricuspid regurgitation.  Past Medical History:  Diagnosis Date  . Arthritis   . Chronic diastolic heart failure ([place])   . Diverticulosis of colon (without mention of hemorrhage)    TCS by Dr. Gala Romney  . GERD (gastroesophageal reflux disease)   . Hemorrhoids, internal    TCS by Dr. Gala Romney  . Irritable bowel syndrome   . Migraine   . Mitral regurgitation   . Peripheral polyneuropathy    Confirmed by EMG and nerve conduction velocities   . Pulmonary nodule   . Seizures ([place]) 2011  . Syncope    Neurocardiogenic - positive tilt table test, on Florinef  . Tubular adenoma   . UTI (urinary tract infection)    Recurrent    Past Surgical History:  Procedure Laterality Date  . APPENDECTOMY    . Back Pain     going to chiropracator for back and hip pain  . BACK SURGERY     x2  . bladder tack    . CATARACT EXTRACTION, BILATERAL  2017  . CHOLECYSTECTOMY    . COLONOSCOPY  06/16/2005   internal hemorrhoids, L side diverticula - Dr. Gala Romney  . COLONOSCOPY N/A 05/02/2013   Dr. Gala Romney- normal rectum, scattered L sided diverticula. bx= tubular  adenoma  . ESOPHAGOGASTRODUODENOSCOPY  12/21/2006     Dr. Vivi Ferns esophagus/Patulous EG junction, small to moderate sized hiatal hernia,   multiple fundal gland type appearing polyps biopsied, otherwise normal  . FOOT SURGERY     cyst removed  . TOTAL ABDOMINAL HYSTERECTOMY    . TOTAL HIP ARTHROPLASTY Right 01/11/2017   Procedure: RIGHT TOTAL HIP ARTHROPLASTY ANTERIOR APPROACH;  Surgeon: Gaynelle Arabian, MD;  Location: [facility];  Service: Orthopedics;  Laterality: Right;  . WRIST SURGERY      Current Outpatient Medications  Medication Sig Dispense Refill  . aspirin EC 81 MG tablet Take 81 mg by mouth daily.    . Cholecalciferol (VITAMIN D) 2000 units CAPS Take by mouth daily.    Marland Kitchen DEXILANT 60 MG capsule TAKE 1 CAPSULE DAILY 90 capsule 2  . ESTRACE VAGINAL 0.1 MG/GM vaginal cream     . fexofenadine (ALLEGRA) 180 MG tablet Take 180 mg by mouth daily.    . fludrocortisone (FLORINEF) 0.1 MG tablet TAKE 1 TABLET THREE TIMES A WEEK 36 tablet 3  . fluticasone (FLONASE) 50 MCG/ACT nasal spray Place 2 sprays into the nose as needed for allergies.     . furosemide (LASIX) 20 MG tablet Take 1 tablet (20 mg total) by mouth daily. Pt states she does not take it on Sundays and when she has  a doctor's appt. (Patient taking differently: Take 20 mg by mouth every other day. ) 90 tablet 3  . Lactobacillus (PROBIOTIC ACIDOPHILUS PO) Take by mouth daily.    Marland Kitchen levothyroxine (SYNTHROID, LEVOTHROID) 75 MCG tablet Take 75 mcg by mouth daily before breakfast.    . methenamine (HIPREX) 1 g tablet Take 1 g by mouth 2 (two) times daily with a meal.    . montelukast (SINGULAIR) 10 MG tablet Take 10 mg by mouth at bedtime.     . Multiple Vitamin (MULTIVITAMIN) tablet Take 1 tablet by mouth daily.    . nitroGLYCERIN (NITROSTAT) 0.4 MG SL tablet Place 1 tablet (0.4 mg total) under the tongue every 5 (five) minutes as needed for chest pain. 25 tablet 3  . Omega-3 Fatty Acids (FISH OIL) 1200 MG CAPS Take by mouth daily.      Vladimir Faster Glycol-Propyl Glycol (SYSTANE OP) Apply 1 drop to eye daily.     . polyvinyl alcohol (LIQUIFILM TEARS) 1.4 % ophthalmic solution Place 1 drop into both eyes as needed for dry eyes. 15 mL 0  . topiramate (TOPAMAX) 50 MG tablet 2 tablets in the morning, 3 tablets at night 450 tablet 2  . vitamin C (ASCORBIC ACID) 500 MG tablet Take 500 mg by mouth daily.    . Wheat Dextrin (BENEFIBER DRINK MIX) PACK Take 1 Dose by mouth. 1 to 2 times daily as needed for constipation     No current facility-administered medications for this visit.    Allergies:  Cephalexin; Doxazosin mesylate; Levofloxacin; Codeine; Indomethacin; Ivp dye [iodinated diagnostic agents]; Morphine; Sulfamethoxazole-trimethoprim; and Penicillins   Social History: The patient  reports that  has never smoked. she has never used smokeless tobacco. She reports that she does not drink alcohol or use drugs.   ROS:  Please see the history of present illness. Otherwise, complete review of systems is positive for hearing loss.  All other systems are reviewed and negative.   Physical Exam: VS:  BP 116/70   Pulse 60   Ht 5\' 8"  (1.727 m)   Wt 185 lb 3.2 oz (84 kg)   SpO2 98%   BMI 28.16 kg/m , BMI Body mass index is 28.16 kg/m.  Wt Readings from Last 3 Encounters:  01/04/18 185 lb 3.2 oz (84 kg)  12/04/17 182 lb 12.8 oz (82.9 kg)  06/22/17 179 lb 3.2 oz (81.3 kg)    General: Patient appears comfortable at rest. HEENT: Conjunctiva and lids normal, oropharynx clear with moist mucosa. Neck: Supple, no elevated JVP or carotid bruits, no thyromegaly. Lungs: Clear to auscultation, nonlabored breathing at rest. Cardiac: Regular rate and rhythm, no S3, soft apical systolic murmur. Abdomen: Soft, nontender, bowel sounds present. Extremities: Mild ankle edema, left worse than right, distal pulses 1-2+. Skin: Warm and dry. Musculoskeletal: No kyphosis. Neuropsychiatric: Alert and oriented x3, affect grossly appropriate.  ECG:  I personally reviewed the tracing from 10/10/2017 which shows sinus rhythm with PACs.  Recent Labwork: 01/06/2017: ALT 14; AST 22 01/13/2017: BUN 13; Creatinine, Ser 0.70; Magnesium 1.6; Potassium 3.7; Sodium 139 01/14/2017: Hemoglobin 10.3; Platelets 102   Other Studies Reviewed Today:  Echocardiogram 01/13/2017: Study Conclusions  - Left ventricle: The cavity size was normal. Systolic function was   normal. The estimated ejection fraction was in the range of 55%   to 60%. Wall motion was normal; there were no regional wall   motion abnormalities. Left ventricular diastolic function   parameters were normal for age. - Mitral valve: There  was mild to moderate regurgitation. - Left atrium: The atrium was mildly dilated. - Tricuspid valve: There was mild-moderate regurgitation directed   centrally. - Pulmonary arteries: Systolic pressure was mildly increased. PA   peak pressure: 38 mm Hg (S).  Assessment and Plan:  1. Intermittent lower leg edema as outlined. We discussed sodium restriction in general. Also elevating her feet when she is able. Would consider using Lasix 20 mg every other day to see if this is more beneficial than just intermittently. With her associated shortness of breath we will also repeat an echocardiogram to make sure that cardiac structure and function remains stable.  2. History of neurocardiogenic syncope. No recurrent events.  3. Mild to moderate mitral and tricuspid regurgitation.  Current medicines were reviewed with the patient today.   Orders Placed This Encounter  Procedures  . ECHOCARDIOGRAM COMPLETE    Disposition: Follow-up in one year, sooner if needed.  Signed, Satira Sark, MD, [facility] 01/04/2018 4:08 PM    [facility] [place], [place], [place] [address] Phone: [phone]; Fax: [phone]

## 2018-01-04 ENCOUNTER — Encounter: Payer: Self-pay | Admitting: Cardiology

## 2018-01-04 ENCOUNTER — Ambulatory Visit (INDEPENDENT_AMBULATORY_CARE_PROVIDER_SITE_OTHER): Payer: Medicare Other | Admitting: Cardiology

## 2018-01-04 VITALS — BP 116/70 | HR 60 | Ht 68.0 in | Wt 185.2 lb

## 2018-01-04 DIAGNOSIS — I5032 Chronic diastolic (congestive) heart failure: Secondary | ICD-10-CM

## 2018-01-04 DIAGNOSIS — R55 Syncope and collapse: Secondary | ICD-10-CM

## 2018-01-04 DIAGNOSIS — R0602 Shortness of breath: Secondary | ICD-10-CM | POA: Diagnosis not present

## 2018-01-04 DIAGNOSIS — I38 Endocarditis, valve unspecified: Secondary | ICD-10-CM

## 2018-01-04 NOTE — Patient Instructions (Signed)
Medication Instructions:  Your physician recommends that you continue on your current medications as directed. Please refer to the Current Medication list given to you today.  Labwork: NONE  Testing/Procedures: Your physician has requested that you have an echocardiogram. Echocardiography is a painless test that uses sound waves to create images of your heart. It provides your doctor with information about the size and shape of your heart and how well your heart's chambers and valves are working. This procedure takes approximately one hour. There are no restrictions for this procedure.  Follow-Up: Your physician wants you to follow-up in: 1 YEAR WITH DR. MCDOWELL. You will receive a reminder letter in the mail two months in advance. If you don't receive a letter, please call our office to schedule the follow-up appointment.  Any Other Special Instructions Will Be Listed Below (If Applicable).  If you need a refill on your cardiac medications before your next appointment, please call your pharmacy. 

## 2018-01-10 ENCOUNTER — Other Ambulatory Visit: Payer: Medicare Other

## 2018-01-12 DIAGNOSIS — G5791 Unspecified mononeuropathy of right lower limb: Secondary | ICD-10-CM | POA: Diagnosis not present

## 2018-01-12 DIAGNOSIS — G5792 Unspecified mononeuropathy of left lower limb: Secondary | ICD-10-CM | POA: Diagnosis not present

## 2018-01-12 DIAGNOSIS — Z6828 Body mass index (BMI) 28.0-28.9, adult: Secondary | ICD-10-CM | POA: Diagnosis not present

## 2018-01-16 DIAGNOSIS — N3281 Overactive bladder: Secondary | ICD-10-CM | POA: Diagnosis not present

## 2018-01-16 DIAGNOSIS — N952 Postmenopausal atrophic vaginitis: Secondary | ICD-10-CM | POA: Diagnosis not present

## 2018-01-16 DIAGNOSIS — N3941 Urge incontinence: Secondary | ICD-10-CM | POA: Diagnosis not present

## 2018-01-16 DIAGNOSIS — R350 Frequency of micturition: Secondary | ICD-10-CM | POA: Diagnosis not present

## 2018-01-16 DIAGNOSIS — Z87440 Personal history of urinary (tract) infections: Secondary | ICD-10-CM | POA: Diagnosis not present

## 2018-01-23 DIAGNOSIS — I739 Peripheral vascular disease, unspecified: Secondary | ICD-10-CM | POA: Diagnosis not present

## 2018-01-23 DIAGNOSIS — M79671 Pain in right foot: Secondary | ICD-10-CM | POA: Diagnosis not present

## 2018-01-23 DIAGNOSIS — M79672 Pain in left foot: Secondary | ICD-10-CM | POA: Diagnosis not present

## 2018-01-24 ENCOUNTER — Ambulatory Visit (INDEPENDENT_AMBULATORY_CARE_PROVIDER_SITE_OTHER): Payer: Medicare Other

## 2018-01-24 ENCOUNTER — Other Ambulatory Visit: Payer: Self-pay

## 2018-01-24 DIAGNOSIS — R0602 Shortness of breath: Secondary | ICD-10-CM | POA: Diagnosis not present

## 2018-01-24 LAB — ECHOCARDIOGRAM COMPLETE
CHL CUP DOP CALC LVOT VTI: 30.3 cm
CHL CUP MV DEC (S): 256
CHL CUP RV SYS PRESS: 50 mmHg
CHL CUP STROKE VOLUME: 44 mL
CHL CUP TV REG PEAK VELOCITY: 344 cm/s
E decel time: 256 msec
E/e' ratio: 9.79
FS: 30 % (ref 28–44)
IV/PV OW: 1.06
LA diam end sys: 28 mm
LA vol A4C: 86.8 ml
LADIAMINDEX: 1.41 cm/m2
LASIZE: 28 mm
LV E/e' medial: 9.79
LV TDI E'MEDIAL: 6.38
LV dias vol: 65 mL (ref 46–106)
LV e' LATERAL: 8.12 cm/s
LVDIAVOLIN: 33 mL/m2
LVEEAVG: 9.79
LVOT area: 3.14 cm2
LVOT peak grad rest: 7 mmHg
LVOTD: 20 mm
LVOTPV: 130 cm/s
LVOTSV: 95 mL
LVSYSVOL: 22 mL (ref 14–42)
LVSYSVOLIN: 11 mL/m2
Lateral S' vel: 11.3 cm/s
MV Peak grad: 3 mmHg
MV pk A vel: 75.7 m/s
MVPKEVEL: 79.5 m/s
PW: 7.82 mm — AB (ref 0.6–1.1)
RV TAPSE: 23.4 mm
Simpson's disk: 67
TDI e' lateral: 8.12
TR max vel: 344 cm/s

## 2018-01-25 ENCOUNTER — Telehealth: Payer: Self-pay

## 2018-01-25 NOTE — Telephone Encounter (Signed)
-----   Message from Merlene Laughter, LPN sent at 03/26/5461  2:58 PM EST -----   ----- Message ----- From: Satira Sark, MD Sent: 01/24/2018   2:54 PM To: Merlene Laughter, LPN  Results reviewed.  LVEF normal at 60-65%.  Mitral regurgitation is only mild, there is moderate tricuspid regurgitation as noted previously with elevated pulmonary pressure.  Likely contributes to some of her leg edema so I would recommend being more consistent with her Lasix use as discussed in the recent note. A copy of this test should be forwarded to Manon Hilding, MD.

## 2018-01-25 NOTE — Telephone Encounter (Signed)
Patient notified. Routed to PCP 

## 2018-01-30 ENCOUNTER — Ambulatory Visit (INDEPENDENT_AMBULATORY_CARE_PROVIDER_SITE_OTHER): Payer: Medicare Other | Admitting: Internal Medicine

## 2018-01-30 ENCOUNTER — Encounter: Payer: Self-pay | Admitting: Internal Medicine

## 2018-01-30 VITALS — BP 123/78 | HR 64 | Temp 97.5°F | Ht 69.0 in | Wt 182.4 lb

## 2018-01-30 DIAGNOSIS — K5909 Other constipation: Secondary | ICD-10-CM

## 2018-01-30 DIAGNOSIS — K219 Gastro-esophageal reflux disease without esophagitis: Secondary | ICD-10-CM

## 2018-01-30 NOTE — Patient Instructions (Signed)
Continue Dexilant 60 mg daily  Use Miralax daily on as needed basis  If abdominal pain recurs, let me know  OV in 6 months

## 2018-01-30 NOTE — Progress Notes (Signed)
Primary Care Physician:  Manon Hilding, MD Primary Gastroenterologist:  Dr. Gala Romney  Pre-Procedure History & Physical: HPI:  [name] is a 84 y.o. female here for follow-up GERD and constipation. Dexalant daily controls reflux symptoms very well. MiraLAX when necessary basis also combats constipation. States she woke up with some epigastric burning on the day of this visit unusual, never had it before but it has resolved on its own. Appendix and gallbladder out.  Past Medical History:  Diagnosis Date  . Arthritis   . Chronic diastolic heart failure ([place])   . Diverticulosis of colon (without mention of hemorrhage)    TCS by Dr. Gala Romney  . GERD (gastroesophageal reflux disease)   . Hemorrhoids, internal    TCS by Dr. Gala Romney  . Irritable bowel syndrome   . Migraine   . Mitral regurgitation   . Peripheral polyneuropathy    Confirmed by EMG and nerve conduction velocities   . Pulmonary nodule   . Seizures ([place]) 2011  . Syncope    Neurocardiogenic - positive tilt table test, on Florinef  . Tubular adenoma   . UTI (urinary tract infection)    Recurrent    Past Surgical History:  Procedure Laterality Date  . APPENDECTOMY    . Back Pain     going to chiropracator for back and hip pain  . BACK SURGERY     x2  . bladder tack    . CATARACT EXTRACTION, BILATERAL  2017  . CHOLECYSTECTOMY    . COLONOSCOPY  06/16/2005   internal hemorrhoids, L side diverticula - Dr. Gala Romney  . COLONOSCOPY N/A 05/02/2013   Dr. Gala Romney- normal rectum, scattered L sided diverticula. bx= tubular adenoma  . ESOPHAGOGASTRODUODENOSCOPY  12/21/2006     Dr. Vivi Ferns esophagus/Patulous EG junction, small to moderate sized hiatal hernia,   multiple fundal gland type appearing polyps biopsied, otherwise normal  . FOOT SURGERY     cyst removed  . TOTAL ABDOMINAL HYSTERECTOMY    . TOTAL HIP ARTHROPLASTY Right 01/11/2017   Procedure: RIGHT TOTAL HIP ARTHROPLASTY ANTERIOR APPROACH;  Surgeon: Gaynelle Arabian,  MD;  Location: [facility];  Service: Orthopedics;  Laterality: Right;  . WRIST SURGERY      Prior to Admission medications   Medication Sig Start Date End Date Taking? Authorizing Provider  aspirin EC 81 MG tablet Take 81 mg by mouth daily.   Yes [provider]  Cholecalciferol (VITAMIN D) 2000 units CAPS Take by mouth daily.   Yes [provider]  DEXILANT 60 MG capsule TAKE 1 CAPSULE DAILY 12/20/17  Yes Annitta Needs, NP  ESTRACE VAGINAL 0.1 MG/GM vaginal cream  05/16/17  Yes [provider]  fexofenadine (ALLEGRA) 180 MG tablet Take 180 mg by mouth daily.   Yes [provider]  fludrocortisone (FLORINEF) 0.1 MG tablet TAKE 1 TABLET THREE TIMES A WEEK 04/10/17  Yes Satira Sark, MD  fluticasone [facility]) 50 MCG/ACT nasal spray Place 2 sprays into the nose as needed for allergies.    Yes [provider]  furosemide (LASIX) 20 MG tablet Take 1 tablet (20 mg total) by mouth daily. Pt states she does not take it on Sundays and when she has a doctor's appt. Patient taking differently: Take 20 mg by mouth daily.  09/07/15  Yes Satira Sark, MD  Lactobacillus (PROBIOTIC ACIDOPHILUS PO) Take by mouth daily.   Yes [provider]  levothyroxine (SYNTHROID, LEVOTHROID) 75 MCG tablet Take 75 mcg by mouth daily  before breakfast.   Yes [provider]  methenamine (HIPREX) 1 g tablet Take 1 g by mouth 2 (two) times daily with a meal.   Yes [provider]  montelukast (SINGULAIR) 10 MG tablet Take 10 mg by mouth at bedtime.    Yes [provider]  Multiple Vitamin (MULTIVITAMIN) tablet Take 1 tablet by mouth daily.   Yes [provider]  nitrofurantoin (MACRODANTIN) 50 MG capsule Take 50 mg by mouth daily.   Yes [provider]  nitroGLYCERIN (NITROSTAT) 0.4 MG SL tablet Place 1 tablet (0.4 mg total) under the tongue every 5 (five) minutes as needed for chest pain. 10/10/17  Yes Satira Sark, MD    Omega-3 Fatty Acids (FISH OIL) 1200 MG CAPS Take by mouth daily.   Yes [provider]  Polyethyl Glycol-Propyl Glycol (SYSTANE OP) Apply 1 drop to eye daily.    Yes [provider]  polyethylene glycol (MIRALAX / GLYCOLAX) packet Take 17 g by mouth daily as needed.   Yes [provider]  polyvinyl alcohol (LIQUIFILM TEARS) 1.4 % ophthalmic solution Place 1 drop into both eyes as needed for dry eyes. 01/13/17  Yes Perkins, Alexzandrew L, PA-C  topiramate (TOPAMAX) 50 MG tablet 2 tablets in the morning, 3 tablets at night 12/13/17  Yes Kathrynn Ducking, MD  vitamin C (ASCORBIC ACID) 500 MG tablet Take 500 mg by mouth daily.   Yes [provider]  Wheat Dextrin (BENEFIBER DRINK MIX) PACK Take 1 Dose by mouth. 1 to 2 times daily as needed for constipation   Yes [provider]    Allergies as of 01/30/2018 - Review Complete 01/30/2018  Allergen Reaction Noted  . Cephalexin Anaphylaxis and Swelling 02/25/2009  . Doxazosin mesylate Other (See Comments) 04/24/2013  . Levofloxacin Anaphylaxis and Rash 02/25/2009  . Codeine Other (See Comments) 02/25/2009  . Indomethacin Other (See Comments) 02/25/2009  . Ivp dye [iodinated diagnostic agents] Hives 04/24/2013  . Morphine Other (See Comments) 02/25/2009  . Sulfamethoxazole-trimethoprim Other (See Comments)   . Penicillins Other (See Comments) 02/25/2009    Family History  Problem Relation Age of Onset  . Stroke Father   . Colon cancer Neg Hx     Social History   Socioeconomic History  . Marital status: Widowed    Spouse name: Not on file  . Number of children: 4  . Years of education: college  . Highest education level: Not on file  Social Needs  . Financial resource strain: Not on file  . Food insecurity - worry: Not on file  . Food insecurity - inability: Not on file  . Transportation needs - medical: Not on file  . Transportation needs - non-medical: Not on file  Occupational History     Comment: Retired  Tobacco Use  . Smoking status: Never Smoker  . Smokeless tobacco: Never Used  . Tobacco comment: tobacco use - no  Substance and Sexual Activity  . Alcohol use: No    Alcohol/week: 0.0 oz  . Drug use: No  . Sexual activity: Not Currently  Other Topics Concern  . Not on file  Social History Narrative   Retired, widowed, education college education .   Right handed.   Caffeine one cup of coffee daily.     Review of Systems: See HPI, otherwise negative ROS  Physical Exam: BP 123/78   Pulse 64   Temp (!) 97.5 F (36.4 C) (Oral)   Ht 5\' 9"  (1.753 m)  Wt 182 lb 6.4 oz (82.7 kg)   BMI 26.94 kg/m  General:   Frail, pleasant and cooperative in NAD Neck:  Supple; no masses or thyromegaly. No significant cervical adenopathy. Lungs:  Clear throughout to auscultation.   No wheezes, crackles, or rhonchi. No acute distress. Heart:  Regular rate and rhythm; no murmurs, clicks, rubs,  or gallops. Abdomen: Non-distended, normal bowel sounds.  Soft and nontender without appreciable mass or hepatosplenomegaly.  Pulses:  Normal pulses noted. Extremities:  Without clubbing or edema.  Impression:  Frail elderly lady with GERD well controlled on Dexialnt 60 mg daily.  No alarm symptoms. Constipation managed with MiraLAX. Fleeting new epigastric pain today of uncertain significance. She has a completely benign abdominal exam today.   Recommendations:   Continue Dexilant 60 mg daily  Use Miralax daily on as needed basis  If abdominal pain recurs, let me know  OV in 6 months     Notice: This dictation was prepared with Dragon dictation along with smaller phrase technology. Any transcriptional errors that result from this process are unintentional and may not be corrected upon review.

## 2018-02-13 DIAGNOSIS — R350 Frequency of micturition: Secondary | ICD-10-CM | POA: Diagnosis not present

## 2018-02-13 DIAGNOSIS — R3915 Urgency of urination: Secondary | ICD-10-CM | POA: Diagnosis not present

## 2018-02-13 DIAGNOSIS — N952 Postmenopausal atrophic vaginitis: Secondary | ICD-10-CM | POA: Diagnosis not present

## 2018-02-13 DIAGNOSIS — N3281 Overactive bladder: Secondary | ICD-10-CM | POA: Diagnosis not present

## 2018-02-14 ENCOUNTER — Other Ambulatory Visit: Payer: Self-pay | Admitting: Cardiology

## 2018-03-12 DIAGNOSIS — L570 Actinic keratosis: Secondary | ICD-10-CM | POA: Diagnosis not present

## 2018-03-12 DIAGNOSIS — Z85828 Personal history of other malignant neoplasm of skin: Secondary | ICD-10-CM | POA: Diagnosis not present

## 2018-03-12 DIAGNOSIS — L280 Lichen simplex chronicus: Secondary | ICD-10-CM | POA: Diagnosis not present

## 2018-03-13 DIAGNOSIS — R3915 Urgency of urination: Secondary | ICD-10-CM | POA: Diagnosis not present

## 2018-03-13 DIAGNOSIS — N952 Postmenopausal atrophic vaginitis: Secondary | ICD-10-CM | POA: Diagnosis not present

## 2018-03-13 DIAGNOSIS — N3281 Overactive bladder: Secondary | ICD-10-CM | POA: Diagnosis not present

## 2018-03-13 DIAGNOSIS — N3946 Mixed incontinence: Secondary | ICD-10-CM | POA: Diagnosis not present

## 2018-03-21 ENCOUNTER — Encounter: Payer: Self-pay | Admitting: Internal Medicine

## 2018-03-22 DIAGNOSIS — Z96641 Presence of right artificial hip joint: Secondary | ICD-10-CM | POA: Diagnosis not present

## 2018-03-22 DIAGNOSIS — M1611 Unilateral primary osteoarthritis, right hip: Secondary | ICD-10-CM | POA: Diagnosis not present

## 2018-03-22 DIAGNOSIS — Z471 Aftercare following joint replacement surgery: Secondary | ICD-10-CM | POA: Diagnosis not present

## 2018-03-28 ENCOUNTER — Encounter: Payer: Self-pay | Admitting: Adult Health

## 2018-03-28 ENCOUNTER — Ambulatory Visit (INDEPENDENT_AMBULATORY_CARE_PROVIDER_SITE_OTHER): Payer: Medicare Other | Admitting: Adult Health

## 2018-03-28 VITALS — BP 132/79 | HR 61 | Ht 68.0 in | Wt 189.0 lb

## 2018-03-28 DIAGNOSIS — M792 Neuralgia and neuritis, unspecified: Secondary | ICD-10-CM | POA: Diagnosis not present

## 2018-03-28 DIAGNOSIS — R519 Headache, unspecified: Secondary | ICD-10-CM

## 2018-03-28 DIAGNOSIS — R569 Unspecified convulsions: Secondary | ICD-10-CM

## 2018-03-28 DIAGNOSIS — R51 Headache: Secondary | ICD-10-CM | POA: Diagnosis not present

## 2018-03-28 DIAGNOSIS — M79672 Pain in left foot: Secondary | ICD-10-CM | POA: Diagnosis not present

## 2018-03-28 DIAGNOSIS — M79671 Pain in right foot: Secondary | ICD-10-CM | POA: Diagnosis not present

## 2018-03-28 DIAGNOSIS — M25572 Pain in left ankle and joints of left foot: Secondary | ICD-10-CM | POA: Diagnosis not present

## 2018-03-28 DIAGNOSIS — M25571 Pain in right ankle and joints of right foot: Secondary | ICD-10-CM | POA: Diagnosis not present

## 2018-03-28 NOTE — Progress Notes (Signed)
PATIENT: [name] [facility] DOB: 06-24-1930  REASON FOR VISIT: follow up HISTORY FROM: patient  HISTORY OF PRESENT ILLNESS: Today 03/28/18 [name] is an 84 year old female with a history of migraine headaches, seizure events and peripheral neuropathy.  She returns today for follow-up.  The patient is currently on Topamax taking 100 mg in the morning and 150 mg at evening.  She states that the increase in Topamax has not improved her peripheral neuropathy.  She still states that she has burning and tingling pain in the legs.  Some days are worse than others.  She is using an over-the-counter diabetic cream that offers her good benefit but she only uses it in the morning and at night because she wears compression stockings during the day.  She states that in the past she has been on gabapentin but this was discontinued due to leg swelling.  The patient reports that her headaches are about the same.  Some days are worse than others.  She denies any seizure events.  She does have trouble with ambulation due to neuropathy.  She returns today for an evaluation.  HISTORY Update 12/04/2017 : She returns for follow-up after last visit 5 months ago.  She complains of increasing foot swelling as well as pain.  She seen a primary physician.  She has been asked to elevate her legs.  She feels at times she cannot walk because the pain bothers her.  She is currently taking Topamax 100 mg in the morning and 100 mg at night and tolerating it well without muscle aches and pains.  She in fact had lower extremity venous Doppler done on 11/10/17 at [facility] which showed no evidence of DVT.  Her primary physician [name] has advised her to use a adjustable bed where her feet can be elevated.  She was admitted from November 8-11 at [facility] for chest pain but workup was negative.  She has had no recurrent seizures.  She continues to have mild intermittent headaches but these are not as bothersome.   She has not been participating in any regular activities for stress relaxation.   REVIEW OF SYSTEMS: Out of a complete 14 system review of symptoms, the patient complains only of the following symptoms, and all other reviewed systems are negative.  Fatigue, eye itching, light sensitivity, cough, shortness of breath, hearing loss, ear pain runny nose, constipation, incontinence of bladder, frequency of urination, urgency, dizziness, headache, weakness, bruise/bleed  ALLERGIES: Allergies  Allergen Reactions  . Cephalexin Anaphylaxis and Swelling  . Doxazosin Mesylate Other (See Comments)    Made patient EXTREMELY sick.   . Levofloxacin Anaphylaxis and Rash  . Codeine Other (See Comments)    Hallucinations  . Indomethacin Other (See Comments)    Severe Headache   . Ivp Dye [Iodinated Diagnostic Agents] Hives  . Morphine Other (See Comments)    Hallucinations   . Sulfamethoxazole-Trimethoprim Other (See Comments)    Unknown   . Penicillins Other (See Comments)    Made patient go into shock Has patient had a PCN reaction causing immediate rash, facial/tongue/throat swelling, SOB or lightheadedness with hypotension: Yes Has patient had a PCN reaction causing severe rash involving mucus membranes or skin necrosis: No Has patient had a PCN reaction that required hospitalization No Has patient had a PCN reaction occurring within the last 10 years: No If all of the above answers are "NO", then may proceed with Cephalosporin use.     HOME MEDICATIONS: Outpatient Medications Prior  to Visit  Medication Sig Dispense Refill  . Cholecalciferol (VITAMIN D) 2000 units CAPS Take by mouth daily.    Marland Kitchen DEXILANT 60 MG capsule TAKE 1 CAPSULE DAILY 90 capsule 2  . ESTRACE VAGINAL 0.1 MG/GM vaginal cream     . fexofenadine (ALLEGRA) 180 MG tablet Take 180 mg by mouth daily.    . fludrocortisone (FLORINEF) 0.1 MG tablet TAKE 1 TABLET THREE TIMES A WEEK 36 tablet 3  . fluticasone (FLONASE) 50 MCG/ACT  nasal spray Place 2 sprays into the nose as needed for allergies.     . furosemide (LASIX) 20 MG tablet Take 1 tablet (20 mg total) by mouth daily. Pt states she does not take it on Sundays and when she has a doctor's appt. (Patient taking differently: Take 20 mg by mouth daily. ) 90 tablet 3  . Lactobacillus (PROBIOTIC ACIDOPHILUS PO) Take by mouth daily.    Marland Kitchen levothyroxine (SYNTHROID, LEVOTHROID) 75 MCG tablet Take 75 mcg by mouth daily before breakfast.    . methenamine (HIPREX) 1 g tablet Take 1 g by mouth 2 (two) times daily with a meal.    . montelukast (SINGULAIR) 10 MG tablet Take 10 mg by mouth at bedtime.     . Multiple Vitamin (MULTIVITAMIN) tablet Take 1 tablet by mouth daily.    . nitrofurantoin (MACRODANTIN) 50 MG capsule Take 50 mg by mouth daily.    . nitroGLYCERIN (NITROSTAT) 0.4 MG SL tablet Place 1 tablet (0.4 mg total) under the tongue every 5 (five) minutes as needed for chest pain. 25 tablet 3  . Omega-3 Fatty Acids (FISH OIL) 1200 MG CAPS Take by mouth daily.    Vladimir Faster Glycol-Propyl Glycol (SYSTANE OP) Apply 1 drop to eye daily.     . polyethylene glycol (MIRALAX / GLYCOLAX) packet Take 17 g by mouth daily as needed.    . polyvinyl alcohol (LIQUIFILM TEARS) 1.4 % ophthalmic solution Place 1 drop into both eyes as needed for dry eyes. 15 mL 0  . topiramate (TOPAMAX) 50 MG tablet 2 tablets in the morning, 3 tablets at night 450 tablet 2  . vitamin C (ASCORBIC ACID) 500 MG tablet Take 500 mg by mouth daily.    . Wheat Dextrin (BENEFIBER DRINK MIX) PACK Take 1 Dose by mouth. 1 to 2 times daily as needed for constipation    . aspirin EC 81 MG tablet Take 81 mg by mouth daily.     No facility-administered medications prior to visit.     PAST MEDICAL HISTORY: Past Medical History:  Diagnosis Date  . Arthritis   . Chronic diastolic heart failure ([place])   . Diverticulosis of colon (without mention of hemorrhage)    TCS by Dr. Gala Romney  . GERD (gastroesophageal reflux  disease)   . Hemorrhoids, internal    TCS by Dr. Gala Romney  . Irritable bowel syndrome   . Migraine   . Mitral regurgitation   . Peripheral polyneuropathy    Confirmed by EMG and nerve conduction velocities   . Pulmonary nodule   . Seizures ([place]) 2011  . Syncope    Neurocardiogenic - positive tilt table test, on Florinef  . Tubular adenoma   . UTI (urinary tract infection)    Recurrent    PAST SURGICAL HISTORY: Past Surgical History:  Procedure Laterality Date  . APPENDECTOMY    . Back Pain     going to chiropracator for back and hip pain  . BACK SURGERY     x2  .  bladder tack    . CATARACT EXTRACTION, BILATERAL  2017  . CHOLECYSTECTOMY    . COLONOSCOPY  06/16/2005   internal hemorrhoids, L side diverticula - Dr. Gala Romney  . COLONOSCOPY N/A 05/02/2013   Dr. Gala Romney- normal rectum, scattered L sided diverticula. bx= tubular adenoma  . ESOPHAGOGASTRODUODENOSCOPY  12/21/2006     Dr. Vivi Ferns esophagus/Patulous EG junction, small to moderate sized hiatal hernia,   multiple fundal gland type appearing polyps biopsied, otherwise normal  . FOOT SURGERY     cyst removed  . TOTAL ABDOMINAL HYSTERECTOMY    . TOTAL HIP ARTHROPLASTY Right 01/11/2017   Procedure: RIGHT TOTAL HIP ARTHROPLASTY ANTERIOR APPROACH;  Surgeon: Gaynelle Arabian, MD;  Location: [facility];  Service: Orthopedics;  Laterality: Right;  . WRIST SURGERY      FAMILY HISTORY: Family History  Problem Relation Age of Onset  . Stroke Father   . Colon cancer Neg Hx     SOCIAL HISTORY: Social History   Socioeconomic History  . Marital status: Widowed    Spouse name: Not on file  . Number of children: 4  . Years of education: college  . Highest education level: Not on file  Occupational History    Comment: Retired  Scientific laboratory technician  . Financial resource strain: Not on file  . Food insecurity:    Worry: Not on file    Inability: Not on file  . Transportation needs:    Medical: Not on file    Non-medical: Not on file    Tobacco Use  . Smoking status: Never Smoker  . Smokeless tobacco: Never Used  . Tobacco comment: tobacco use - no  Substance and Sexual Activity  . Alcohol use: No    Alcohol/week: 0.0 oz  . Drug use: No  . Sexual activity: Not Currently  Lifestyle  . Physical activity:    Days per week: Not on file    Minutes per session: Not on file  . Stress: Not on file  Relationships  . Social connections:    Talks on phone: Not on file    Gets together: Not on file    Attends religious service: Not on file    Active member of club or organization: Not on file    Attends meetings of clubs or organizations: Not on file    Relationship status: Not on file  . Intimate partner violence:    Fear of current or ex partner: Not on file    Emotionally abused: Not on file    Physically abused: Not on file    Forced sexual activity: Not on file  Other Topics Concern  . Not on file  Social History Narrative   Retired, widowed, education college education .   Right handed.   Caffeine one cup of coffee daily.       PHYSICAL EXAM  Vitals:   03/28/18 1337  BP: 132/79  Pulse: 61  Weight: 189 lb (85.7 kg)  Height: 5\' 8"  (1.727 m)  Gait is slightly wide-based. Body mass index is 28.74 kg/m.  Generalized: Well developed, in no acute distress   Neurological examination  Mentation: Alert oriented to time, place, history taking. Follows all commands speech and language fluent Cranial nerve II-XII: Pupils were equal round reactive to light. Extraocular movements were full, visual field were full on confrontational test. Facial sensation and strength were normal. Uvula tongue midline. Head turning and shoulder shrug  were normal and symmetric. Motor: The motor testing reveals 5 over 5 strength of  all 4 extremities. Good symmetric motor tone is noted throughout.  Sensory: Sensory testing is intact to soft touch on all 4 extremities. No evidence of extinction is noted.  Coordination: Cerebellar  testing reveals good finger-nose-finger and heel-to-shin bilaterally.  Gait and station: Gait is slightly wide-based Reflexes: Deep tendon reflexes are symmetric and normal bilaterally.   DIAGNOSTIC DATA (LABS, IMAGING, TESTING) - I reviewed patient records, labs, notes, testing and imaging myself where available.  Lab Results  Component Value Date   WBC 6.3 01/14/2017   HGB 10.3 (L) 01/14/2017   HCT 30.9 (L) 01/14/2017   MCV 84.4 01/14/2017   PLT 102 (L) 01/14/2017      Component Value Date/Time   NA 139 01/13/2017 0520   K 3.7 01/13/2017 0520   CL 110 01/13/2017 0520   CO2 25 01/13/2017 0520   GLUCOSE 142 (H) 01/13/2017 0520   BUN 13 01/13/2017 0520   CREATININE 0.70 01/13/2017 0520   CALCIUM 8.6 (L) 01/13/2017 0520   PROT 6.6 01/06/2017 1518   ALBUMIN 3.9 01/06/2017 1518   AST 22 01/06/2017 1518   ALT 14 01/06/2017 1518   ALKPHOS 57 01/06/2017 1518   BILITOT 0.4 01/06/2017 1518   GFRNONAA >60 01/13/2017 0520   GFRAA >60 01/13/2017 0520    Lab Results  Component Value Date   TSH 5.694 (H) 08/24/2010      ASSESSMENT AND PLAN 84 y.o. year old female  has a past medical history of Arthritis, Chronic diastolic heart failure ([place]), Diverticulosis of colon (without mention of hemorrhage), GERD (gastroesophageal reflux disease), Hemorrhoids, internal, Irritable bowel syndrome, Migraine, Mitral regurgitation, Peripheral polyneuropathy, Pulmonary nodule, Seizures ([place]) (2011), Syncope, Tubular adenoma, and UTI (urinary tract infection). here with:   1.  Peripheral neuropathy 2.  Migraine headaches 3.  Seizures  The patient continues to have discomfort in the feet.  We discussed potentially retrying gabapentin but the patient deferred as she is worried about potential swelling.  I encouraged the patient to increase her use of the diabetic foot cream.  She will continue on Topamax 100 mg in the morning and 150 mg in the evening.  She is advised that if her symptoms worsen or  she develops new symptoms she should let us know.  Follow-up in 6 months or sooner if needed   Ward Givens, MSN, NP-C 03/28/2018, 1:39 PM [facility] [address] [place], [place] [address] [phone]

## 2018-03-28 NOTE — Patient Instructions (Signed)
Your Plan:  Continue Topamax Use diabetic cream more often  If your symptoms worsen or you develop new symptoms please let us know.   Thank you for coming to see [place] at [facility]. I hope we have been able to provide you high quality care today.  You may receive a patient satisfaction survey over the next few weeks. We would appreciate your feedback and comments so that we may continue to improve ourselves and the health of our patients.

## 2018-04-06 NOTE — Progress Notes (Signed)
I agree with the above plan 

## 2018-04-09 DIAGNOSIS — J069 Acute upper respiratory infection, unspecified: Secondary | ICD-10-CM | POA: Diagnosis not present

## 2018-04-09 DIAGNOSIS — E6609 Other obesity due to excess calories: Secondary | ICD-10-CM | POA: Diagnosis not present

## 2018-04-09 DIAGNOSIS — E782 Mixed hyperlipidemia: Secondary | ICD-10-CM | POA: Diagnosis not present

## 2018-04-09 DIAGNOSIS — E039 Hypothyroidism, unspecified: Secondary | ICD-10-CM | POA: Diagnosis not present

## 2018-04-09 DIAGNOSIS — J209 Acute bronchitis, unspecified: Secondary | ICD-10-CM | POA: Diagnosis not present

## 2018-04-09 DIAGNOSIS — K210 Gastro-esophageal reflux disease with esophagitis: Secondary | ICD-10-CM | POA: Diagnosis not present

## 2018-04-09 DIAGNOSIS — Z6828 Body mass index (BMI) 28.0-28.9, adult: Secondary | ICD-10-CM | POA: Diagnosis not present

## 2018-04-09 DIAGNOSIS — G40909 Epilepsy, unspecified, not intractable, without status epilepticus: Secondary | ICD-10-CM | POA: Diagnosis not present

## 2018-04-10 DIAGNOSIS — R3915 Urgency of urination: Secondary | ICD-10-CM | POA: Diagnosis not present

## 2018-04-10 DIAGNOSIS — N3281 Overactive bladder: Secondary | ICD-10-CM | POA: Diagnosis not present

## 2018-04-10 DIAGNOSIS — N952 Postmenopausal atrophic vaginitis: Secondary | ICD-10-CM | POA: Diagnosis not present

## 2018-04-10 DIAGNOSIS — N3946 Mixed incontinence: Secondary | ICD-10-CM | POA: Diagnosis not present

## 2018-04-13 DIAGNOSIS — Z1331 Encounter for screening for depression: Secondary | ICD-10-CM | POA: Diagnosis not present

## 2018-04-13 DIAGNOSIS — G40909 Epilepsy, unspecified, not intractable, without status epilepticus: Secondary | ICD-10-CM | POA: Diagnosis not present

## 2018-04-13 DIAGNOSIS — I872 Venous insufficiency (chronic) (peripheral): Secondary | ICD-10-CM | POA: Diagnosis not present

## 2018-04-13 DIAGNOSIS — Z6827 Body mass index (BMI) 27.0-27.9, adult: Secondary | ICD-10-CM | POA: Diagnosis not present

## 2018-04-13 DIAGNOSIS — R7301 Impaired fasting glucose: Secondary | ICD-10-CM | POA: Diagnosis not present

## 2018-04-13 DIAGNOSIS — E039 Hypothyroidism, unspecified: Secondary | ICD-10-CM | POA: Diagnosis not present

## 2018-04-13 DIAGNOSIS — I951 Orthostatic hypotension: Secondary | ICD-10-CM | POA: Diagnosis not present

## 2018-04-13 DIAGNOSIS — Z1389 Encounter for screening for other disorder: Secondary | ICD-10-CM | POA: Diagnosis not present

## 2018-04-17 DIAGNOSIS — I739 Peripheral vascular disease, unspecified: Secondary | ICD-10-CM | POA: Diagnosis not present

## 2018-05-11 DIAGNOSIS — R3915 Urgency of urination: Secondary | ICD-10-CM | POA: Diagnosis not present

## 2018-05-11 DIAGNOSIS — N3946 Mixed incontinence: Secondary | ICD-10-CM | POA: Diagnosis not present

## 2018-05-11 DIAGNOSIS — R350 Frequency of micturition: Secondary | ICD-10-CM | POA: Diagnosis not present

## 2018-05-11 DIAGNOSIS — N952 Postmenopausal atrophic vaginitis: Secondary | ICD-10-CM | POA: Diagnosis not present

## 2018-05-11 DIAGNOSIS — N3281 Overactive bladder: Secondary | ICD-10-CM | POA: Diagnosis not present

## 2018-05-29 ENCOUNTER — Ambulatory Visit: Payer: Medicare Other

## 2018-05-29 DIAGNOSIS — R600 Localized edema: Secondary | ICD-10-CM | POA: Diagnosis not present

## 2018-05-29 DIAGNOSIS — Z6827 Body mass index (BMI) 27.0-27.9, adult: Secondary | ICD-10-CM | POA: Diagnosis not present

## 2018-05-29 DIAGNOSIS — R51 Headache: Secondary | ICD-10-CM | POA: Diagnosis not present

## 2018-05-29 DIAGNOSIS — E039 Hypothyroidism, unspecified: Secondary | ICD-10-CM | POA: Diagnosis not present

## 2018-05-29 DIAGNOSIS — R42 Dizziness and giddiness: Secondary | ICD-10-CM | POA: Diagnosis not present

## 2018-05-29 DIAGNOSIS — K121 Other forms of stomatitis: Secondary | ICD-10-CM | POA: Diagnosis not present

## 2018-06-07 DIAGNOSIS — R600 Localized edema: Secondary | ICD-10-CM | POA: Diagnosis not present

## 2018-06-07 DIAGNOSIS — G6289 Other specified polyneuropathies: Secondary | ICD-10-CM | POA: Diagnosis not present

## 2018-06-07 DIAGNOSIS — Z6827 Body mass index (BMI) 27.0-27.9, adult: Secondary | ICD-10-CM | POA: Diagnosis not present

## 2018-06-19 DIAGNOSIS — M19011 Primary osteoarthritis, right shoulder: Secondary | ICD-10-CM | POA: Diagnosis not present

## 2018-07-02 ENCOUNTER — Other Ambulatory Visit: Payer: Self-pay | Admitting: Cardiology

## 2018-07-02 MED ORDER — FUROSEMIDE 20 MG PO TABS: 20.0000 mg | ORAL_TABLET | Freq: Every day | ORAL | 3 refills | Status: DC

## 2018-07-02 NOTE — Telephone Encounter (Signed)
°*  STAT* If patient is at the pharmacy, call can be transferred to refill team.   1. Which medications need to be refilled? furosemide (LASIX) 20 MG tablet   2. Which pharmacy/location (including street and city if local pharmacy) is medication to be sent to?   [facility]  3. Do they need a 30 day or 90 day supply?

## 2018-07-10 DIAGNOSIS — L600 Ingrowing nail: Secondary | ICD-10-CM | POA: Diagnosis not present

## 2018-07-10 DIAGNOSIS — M79671 Pain in right foot: Secondary | ICD-10-CM | POA: Diagnosis not present

## 2018-07-10 DIAGNOSIS — M79672 Pain in left foot: Secondary | ICD-10-CM | POA: Diagnosis not present

## 2018-07-10 DIAGNOSIS — I739 Peripheral vascular disease, unspecified: Secondary | ICD-10-CM | POA: Diagnosis not present

## 2018-07-13 IMAGING — DX DG PORTABLE PELVIS
1 series · 1 of 1 positions shown · non-contrast
Comparison: None.

CLINICAL DATA: Status post right hip replacement.

EXAM:
PORTABLE PELVIS 1-2 VIEWS

[pelvis ap]
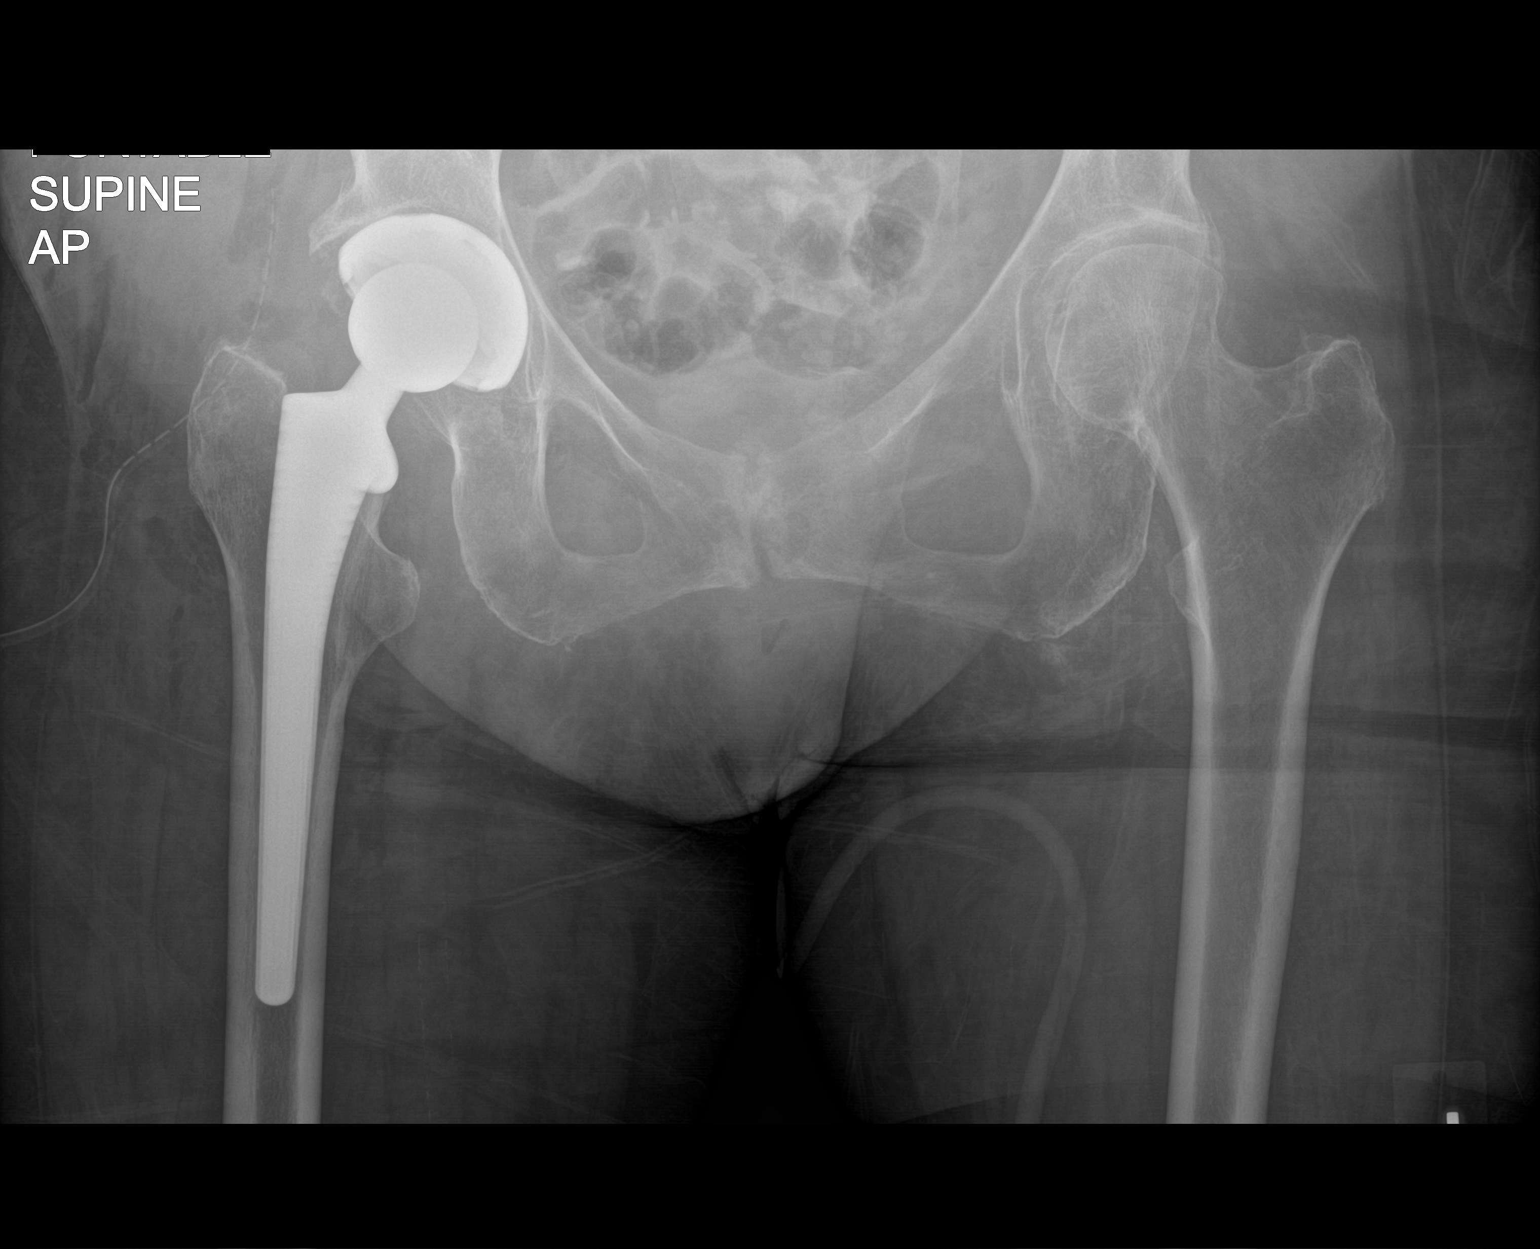

[1 of 1 positions shown; findings below may reference images not displayed]

FINDINGS: Sequelae of recent right total hip arthroplasty are identified. The
prosthetic femoral and acetabular components appear normally located
on this single AP projection. A drain and postoperative gas are
noted in the adjacent soft tissues. No fracture is identified. Left
hip joint space width appears preserved.
IMPRESSION: Recent right total hip arthroplasty without evidence of acute
complication.

## 2018-07-24 DIAGNOSIS — M19011 Primary osteoarthritis, right shoulder: Secondary | ICD-10-CM | POA: Diagnosis not present

## 2018-08-01 DIAGNOSIS — H04123 Dry eye syndrome of bilateral lacrimal glands: Secondary | ICD-10-CM | POA: Diagnosis not present

## 2018-08-15 DIAGNOSIS — I951 Orthostatic hypotension: Secondary | ICD-10-CM | POA: Diagnosis not present

## 2018-08-15 DIAGNOSIS — K210 Gastro-esophageal reflux disease with esophagitis: Secondary | ICD-10-CM | POA: Diagnosis not present

## 2018-08-15 DIAGNOSIS — E039 Hypothyroidism, unspecified: Secondary | ICD-10-CM | POA: Diagnosis not present

## 2018-08-15 DIAGNOSIS — R51 Headache: Secondary | ICD-10-CM | POA: Diagnosis not present

## 2018-08-16 ENCOUNTER — Ambulatory Visit (INDEPENDENT_AMBULATORY_CARE_PROVIDER_SITE_OTHER): Payer: Medicare Other | Admitting: Gastroenterology

## 2018-08-16 ENCOUNTER — Encounter: Payer: Self-pay | Admitting: Gastroenterology

## 2018-08-16 ENCOUNTER — Encounter

## 2018-08-16 VITALS — BP 107/72 | HR 64 | Temp 97.6°F | Ht 68.0 in | Wt 182.6 lb

## 2018-08-16 DIAGNOSIS — K219 Gastro-esophageal reflux disease without esophagitis: Secondary | ICD-10-CM

## 2018-08-16 DIAGNOSIS — K5900 Constipation, unspecified: Secondary | ICD-10-CM | POA: Diagnosis not present

## 2018-08-16 LAB — COMPLETE METABOLIC PANEL WITH GFR
AG Ratio: 1.7 (calc) (ref 1.0–2.5)
ALBUMIN MSPROF: 4 g/dL (ref 3.6–5.1)
ALT: 12 U/L (ref 6–29)
AST: 21 U/L (ref 10–35)
Alkaline phosphatase (APISO): 63 U/L (ref 33–130)
BUN: 12 mg/dL (ref 7–25)
CALCIUM: 9.4 mg/dL (ref 8.6–10.4)
CO2: 27 mmol/L (ref 20–32)
CREATININE: 0.82 mg/dL (ref 0.60–0.88)
Chloride: 109 mmol/L (ref 98–110)
GFR, EST NON AFRICAN AMERICAN: 65 mL/min/{1.73_m2} (ref >=60)
GFR, Est African American: 75 mL/min/{1.73_m2} (ref >=60)
GLUCOSE: 86 mg/dL (ref 65–139)
Globulin: 2.3 g/dL (calc) (ref 1.9–3.7)
Potassium: 4 mmol/L (ref 3.5–5.3)
SODIUM: 143 mmol/L (ref 135–146)
TOTAL PROTEIN: 6.3 g/dL (ref 6.1–8.1)
Total Bilirubin: 0.6 mg/dL (ref 0.2–1.2)

## 2018-08-16 LAB — CBC WITH DIFFERENTIAL/PLATELET
Basophils Absolute: 22 cells/uL (ref 0–200)
Basophils Relative: 0.5 %
EOS PCT: 5.4 %
Eosinophils Absolute: 238 cells/uL (ref 15–500)
HCT: 38.7 % (ref 35.0–45.0)
HEMOGLOBIN: 12.8 g/dL (ref 11.7–15.5)
Lymphs Abs: 1170 cells/uL (ref 850–3900)
MCH: 27.1 pg (ref 27.0–33.0)
MCHC: 33.1 g/dL (ref 32.0–36.0)
MCV: 82 fL (ref 80.0–100.0)
MONOS PCT: 10.1 %
MPV: 10.2 fL (ref 7.5–12.5)
NEUTROS ABS: 2526 {cells}/uL (ref 1500–7800)
NEUTROS PCT: 57.4 %
Platelets: 136 10*3/uL — ABNORMAL LOW (ref 140–400)
RBC: 4.72 10*6/uL (ref 3.80–5.10)
RDW: 14.9 % (ref 11.0–15.0)
Total Lymphocyte: 26.6 %
WBC mixed population: 444 cells/uL (ref 200–950)
WBC: 4.4 10*3/uL (ref 3.8–10.8)

## 2018-08-16 LAB — TSH: TSH: 1.73 m[IU]/L (ref 0.40–4.50)

## 2018-08-16 NOTE — Assessment & Plan Note (Signed)
Miralax daily, high fiber foods, not ideally managed. Will start low dose Amitiza 8 mcg po BID. Samples provided. Call in about a week with an update. Hold Miralax for now. Will have her return for close follow-up in 3 months.

## 2018-08-16 NOTE — Patient Instructions (Signed)
Stop Miralax. I would like for you to try Amitiza instead. Take 1 gelcap WITH FOOD twice a day (at breakfast and supper). Make sure you take with food so that you can avoid nausea. Call me in about a week with how this works!  I have ordered blood work today.  Continue Dexilant as you are doing.  I will see you in 3 months!  Thank you for the purple bag!!  It was a pleasure to see you today. I strive to create trusting relationships with patients to provide genuine, compassionate, and quality care. I value your feedback. If you receive a survey regarding your visit,  I greatly appreciate you taking time to fill this out.   Annitta Needs, PhD, ANP-BC [facility]

## 2018-08-16 NOTE — Assessment & Plan Note (Signed)
Controlled with Dexilant daily. Continue.

## 2018-08-16 NOTE — Progress Notes (Signed)
CC'D TO PCP °

## 2018-08-16 NOTE — Progress Notes (Signed)
Referring Provider: Manon Hilding, MD Primary Care Physician:  Manon Hilding, MD Primary GI: Dr. Gala Romney   Chief Complaint  Patient presents with  . abdominal swelling  . Diarrhea    HPI:   [name] is a 84 y.o. female presenting today with a history of GERD and constipation. Last seen in Feb 2019 and here for follow-up.   States she was told to stop Benefiber. She is eating high fiber foods instead. Miralax daily but does not have a BM daily. Sometimes hard pebbles. Occasional loose stool but not persistent. Does not take Miralax if she is going to an appt or on Sundays due to church. Has gas a lot. Lower abdominal bloating. GERD controlled with Dexilant. Does not want to pursue colonoscopy.   Past Medical History:  Diagnosis Date  . Arthritis   . Chronic diastolic heart failure ([place])   . Diverticulosis of colon (without mention of hemorrhage)    TCS by Dr. Gala Romney  . GERD (gastroesophageal reflux disease)   . Hemorrhoids, internal    TCS by Dr. Gala Romney  . Irritable bowel syndrome   . Migraine   . Mitral regurgitation   . Peripheral polyneuropathy    Confirmed by EMG and nerve conduction velocities   . Pulmonary nodule   . Seizures ([place]) 2011  . Syncope    Neurocardiogenic - positive tilt table test, on Florinef  . Tubular adenoma   . UTI (urinary tract infection)    Recurrent    Past Surgical History:  Procedure Laterality Date  . APPENDECTOMY    . Back Pain     going to chiropracator for back and hip pain  . BACK SURGERY     x2  . bladder tack    . CATARACT EXTRACTION, BILATERAL  2017  . CHOLECYSTECTOMY    . COLONOSCOPY  06/16/2005   internal hemorrhoids, L side diverticula - Dr. Gala Romney  . COLONOSCOPY N/A 05/02/2013   Dr. Gala Romney- normal rectum, scattered L sided diverticula. bx= tubular adenoma  . ESOPHAGOGASTRODUODENOSCOPY  12/21/2006     Dr. Vivi Ferns esophagus/Patulous EG junction, small to moderate sized hiatal hernia,   multiple fundal gland type  appearing polyps biopsied, otherwise normal  . FOOT SURGERY     cyst removed  . TOTAL ABDOMINAL HYSTERECTOMY    . TOTAL HIP ARTHROPLASTY Right 01/11/2017   Procedure: RIGHT TOTAL HIP ARTHROPLASTY ANTERIOR APPROACH;  Surgeon: Gaynelle Arabian, MD;  Location: [facility];  Service: Orthopedics;  Laterality: Right;  . WRIST SURGERY      Current Outpatient Medications  Medication Sig Dispense Refill  . aspirin EC 81 MG tablet Take 81 mg by mouth daily.    . Cholecalciferol (VITAMIN D) 2000 units CAPS Take by mouth daily.    Marland Kitchen DEXILANT 60 MG capsule TAKE 1 CAPSULE DAILY 90 capsule 2  . ESTRACE VAGINAL 0.1 MG/GM vaginal cream     . fexofenadine (ALLEGRA) 180 MG tablet Take 180 mg by mouth daily.    . fludrocortisone (FLORINEF) 0.1 MG tablet TAKE 1 TABLET THREE TIMES A WEEK 36 tablet 3  . fluticasone (FLONASE) 50 MCG/ACT nasal spray Place 2 sprays into the nose as needed for allergies.     . furosemide (LASIX) 20 MG tablet Take 1 tablet (20 mg total) by mouth daily. Pt states she does not take it on Sundays and when she has a doctor's appt. 90 tablet 3  . Lactobacillus (PROBIOTIC ACIDOPHILUS PO) Take by mouth daily.    Marland Kitchen  levothyroxine (SYNTHROID, LEVOTHROID) 75 MCG tablet Take 75 mcg by mouth daily before breakfast.    . methenamine (HIPREX) 1 g tablet Take 1 g by mouth 2 (two) times daily with a meal.    . montelukast (SINGULAIR) 10 MG tablet Take 10 mg by mouth at bedtime.     . Multiple Vitamin (MULTIVITAMIN) tablet Take 1 tablet by mouth daily.    . nitrofurantoin (MACRODANTIN) 50 MG capsule Take 50 mg by mouth daily.    . nitroGLYCERIN (NITROSTAT) 0.4 MG SL tablet Place 1 tablet (0.4 mg total) under the tongue every 5 (five) minutes as needed for chest pain. 25 tablet 3  . Omega-3 Fatty Acids (FISH OIL) 1200 MG CAPS Take by mouth daily.    Vladimir Faster Glycol-Propyl Glycol (SYSTANE OP) Apply 1 drop to eye daily.     . polyethylene glycol (MIRALAX / GLYCOLAX) packet Take 17 g by mouth daily as needed.     . polyvinyl alcohol (LIQUIFILM TEARS) 1.4 % ophthalmic solution Place 1 drop into both eyes as needed for dry eyes. 15 mL 0  . topiramate (TOPAMAX) 50 MG tablet 2 tablets in the morning, 3 tablets at night 450 tablet 2  . amitriptyline (ELAVIL) 10 MG tablet Take 1 tablet by mouth at bedtime.    . vitamin C (ASCORBIC ACID) 500 MG tablet Take 500 mg by mouth daily.    . Wheat Dextrin (BENEFIBER DRINK MIX) PACK Take 1 Dose by mouth. 1 to 2 times daily as needed for constipation     No current facility-administered medications for this visit.     Allergies as of 08/16/2018 - Review Complete 08/16/2018  Allergen Reaction Noted  . Cephalexin Anaphylaxis and Swelling 02/25/2009  . Doxazosin mesylate Other (See Comments) 04/24/2013  . Levofloxacin Anaphylaxis and Rash 02/25/2009  . Codeine Other (See Comments) 02/25/2009  . Indomethacin Other (See Comments) 02/25/2009  . Ivp dye [iodinated diagnostic agents] Hives 04/24/2013  . Morphine Other (See Comments) 02/25/2009  . Sulfamethoxazole-trimethoprim Other (See Comments)   . Penicillins Other (See Comments) 02/25/2009    Family History  Problem Relation Age of Onset  . Stroke Father   . Colon cancer Neg Hx     Social History   Socioeconomic History  . Marital status: Widowed    Spouse name: Not on file  . Number of children: 4  . Years of education: college  . Highest education level: Not on file  Occupational History    Comment: Retired  Scientific laboratory technician  . Financial resource strain: Not on file  . Food insecurity:    Worry: Not on file    Inability: Not on file  . Transportation needs:    Medical: Not on file    Non-medical: Not on file  Tobacco Use  . Smoking status: Never Smoker  . Smokeless tobacco: Never Used  . Tobacco comment: tobacco use - no  Substance and Sexual Activity  . Alcohol use: No    Alcohol/week: 0.0 standard drinks  . Drug use: No  . Sexual activity: Not Currently  Lifestyle  . Physical activity:      Days per week: Not on file    Minutes per session: Not on file  . Stress: Not on file  Relationships  . Social connections:    Talks on phone: Not on file    Gets together: Not on file    Attends religious service: Not on file    Active member of club or organization: Not on  file    Attends meetings of clubs or organizations: Not on file    Relationship status: Not on file  Other Topics Concern  . Not on file  Social History Narrative   Retired, widowed, education college education .   Right handed.   Caffeine one cup of coffee daily.     Review of Systems: Gen: Denies fever, chills, anorexia. Denies fatigue, weakness, weight loss.  CV: Denies chest pain, palpitations, syncope, peripheral edema, and claudication. Resp: Denies dyspnea at rest, cough, wheezing, coughing up blood, and pleurisy. GI: see HPI Derm: Denies rash, itching, dry skin Psych: Denies depression, anxiety, memory loss, confusion. No homicidal or suicidal ideation.  Heme: Denies bruising, bleeding, and enlarged lymph nodes.  Physical Exam: BP 107/72   Pulse 64   Temp 97.6 F (36.4 C) (Oral)   Ht 5\' 8"  (1.727 m)   Wt 182 lb 9.6 oz (82.8 kg)   BMI 27.76 kg/m  General:   Alert and oriented. No distress noted. Pleasant and cooperative.  Head:  Normocephalic and atraumatic. Eyes:  Conjuctiva clear without scleral icterus. Mouth:  Oral mucosa pink and moist.  Abdomen:  +BS, soft, non-tender and non-distended. No rebound or guarding. No HSM or masses noted. Msk:  Symmetrical without gross deformities. Normal posture. Extremities:  Without edema. Neurologic:  Alert and  oriented x4 Psych:  Alert and cooperative. Normal mood and affect.

## 2018-08-21 NOTE — Progress Notes (Signed)
Please let patient know that her hgb is normal. Her platelets are on the lower end, but this is chronic. Has she ever seen a Hematologist? We have this documented last year and also in 2011. CMP and TSH are normal.

## 2018-08-23 DIAGNOSIS — M25579 Pain in unspecified ankle and joints of unspecified foot: Secondary | ICD-10-CM | POA: Diagnosis not present

## 2018-08-23 DIAGNOSIS — M79671 Pain in right foot: Secondary | ICD-10-CM | POA: Diagnosis not present

## 2018-08-23 DIAGNOSIS — M1990 Unspecified osteoarthritis, unspecified site: Secondary | ICD-10-CM | POA: Diagnosis not present

## 2018-08-23 DIAGNOSIS — M779 Enthesopathy, unspecified: Secondary | ICD-10-CM | POA: Diagnosis not present

## 2018-08-23 NOTE — Progress Notes (Signed)
She can stop Amitiza. Go back to taking Miralax daily as needed. Would also incorporate Benefiber back into the daily regimen. I would recommend referral to a hematologist.

## 2018-08-24 ENCOUNTER — Telehealth: Payer: Self-pay

## 2018-08-24 ENCOUNTER — Other Ambulatory Visit: Payer: Self-pay | Admitting: *Deleted

## 2018-08-24 DIAGNOSIS — R899 Unspecified abnormal finding in specimens from other organs, systems and tissues: Secondary | ICD-10-CM

## 2018-08-24 NOTE — Telephone Encounter (Signed)
Spoke with patient and made her aware that to our knowledge no hematologists in [place]. She voiced understanding

## 2018-08-24 NOTE — Telephone Encounter (Signed)
I sent a lab note back on this pt. Pt called back and asked if the hematology ref can be made in [place] since it's closer to where she lives.

## 2018-08-29 ENCOUNTER — Encounter (HOSPITAL_COMMUNITY): Payer: Self-pay | Admitting: Internal Medicine

## 2018-08-29 ENCOUNTER — Inpatient Hospital Stay (HOSPITAL_COMMUNITY): Payer: Medicare Other | Attending: Internal Medicine | Admitting: Internal Medicine

## 2018-08-29 VITALS — BP 116/63 | HR 65 | Temp 98.2°F | Resp 16 | Ht 67.0 in | Wt 179.8 lb

## 2018-08-29 DIAGNOSIS — D696 Thrombocytopenia, unspecified: Secondary | ICD-10-CM

## 2018-08-29 DIAGNOSIS — L280 Lichen simplex chronicus: Secondary | ICD-10-CM | POA: Diagnosis not present

## 2018-08-29 DIAGNOSIS — I10 Essential (primary) hypertension: Secondary | ICD-10-CM

## 2018-08-29 NOTE — Patient Instructions (Signed)
[facility] Discharge Instructions  You saw Dr. Higgs today.   Thank you for choosing [facility] to provide your oncology and hematology care.  To afford each patient quality time with our provider, please arrive at least 15 minutes before your scheduled appointment time.   If you have a lab appointment with the Cancer Center please come in thru the  Main Entrance and check in at the main information desk  You need to re-schedule your appointment should you arrive 10 or more minutes late.  We strive to give you quality time with our providers, and arriving late affects you and other patients whose appointments are after yours.  Also, if you no show three or more times for appointments you may be dismissed from the clinic at the providers discretion.     Again, thank you for choosing [facility].  Our hope is that these requests will decrease the amount of time that you wait before being seen by our physicians.       _____________________________________________________________  Should you have questions after your visit to [facility], please contact our office at [phone] between the hours of 8:00 a.m. and 4:30 p.m.  Voicemails left after 4:00 p.m. will not be returned until the following business day.  For prescription refill requests, have your pharmacy contact our office and allow 72 hours.    Cancer Center Support Programs:   > Cancer Support Group  2nd Tuesday of the month 1pm-2pm, Journey Room    

## 2018-08-29 NOTE — Progress Notes (Signed)
Referring physician:  [facility]  Diagnosis No diagnosis found.  Staging Cancer Staging No matching staging information was found for the patient.  Assessment and Plan:  1.  Thrombocytopenia.    Labs done 08/16/2018 reviewed and showed a white count of 4.4, hemoglobin 12.8, platelets 136,000.  She has a normal differential.  Chemistries within normal limits with a creatinine of 0.82.  I discussed with the patient that recent blood work showed normal platelet count of 136,000.  She was given option of repeat lab evaluation but reports that she will have labs done in a few weeks at Dr. Edythe Lynn office.  We will obtain those labs for review. I have reassured her blood work recently was within normal limits.  She will RTC in 4-6 weeks for follow-up to go over labs.    2.  Hypertension.  Blood pressure is 116/63.  Follow-up with PCP.  HPI: 84 year old female referred for evaluation of abnormal labs.  She had a previous history of thrombocytopenia in 2018.  Labs done 08/16/2018 reviewed and showed a white count of 4.4, hemoglobin 12.8, platelets 136,000.  She has a normal differential.  Potassium was 4 creatinine 0.82 normal liver function tests.  Patient is seen today for consultation due to prior history of thrombocytopenia.   Problem List Patient Active Problem List   Diagnosis Date Noted  . PAC (premature atrial contraction) [I49.1]   . Bradycardia [R00.1]   . Neurocardiogenic syncope [R55]   . OA (osteoarthritis) of hip [M16.9] 01/11/2017  . Sensory neuropathy [G62.9] 07/11/2014  . Falls [phone]] 07/11/2014  . Abnormality of gait [R26.9] 07/11/2014  . Chronic diastolic heart failure ([place]) [I50.32] 11/01/2012  . Exertional dyspnea [R06.09] 10/12/2012  . Constipation [K59.00] 04/10/2012  . Chest pain [R07.9] 06/08/2011  . Mitral regurgitation [I34.0]   . HYPERTENSION, PULMONARY [I27.89] 06/10/2010  . HIATAL HERNIA WITH REFLUX [K44.9] 10/15/2009  . Vasovagal syncope [R55] 09/23/2009   . GERD (gastroesophageal reflux disease) [K21.9] 02/25/2009  . CONTRAST DYE ALLERGY [Z91.041] 02/25/2009    Past Medical History Past Medical History:  Diagnosis Date  . Arthritis   . Chronic diastolic heart failure ([place])   . Diverticulosis of colon (without mention of hemorrhage)    TCS by Dr. Gala Romney  . GERD (gastroesophageal reflux disease)   . Hemorrhoids, internal    TCS by Dr. Gala Romney  . Irritable bowel syndrome   . Migraine   . Mitral regurgitation   . Peripheral polyneuropathy    Confirmed by EMG and nerve conduction velocities   . Pulmonary nodule   . Seizures ([place]) 2011  . Syncope    Neurocardiogenic - positive tilt table test, on Florinef  . Tubular adenoma   . UTI (urinary tract infection)    Recurrent    Past Surgical History Past Surgical History:  Procedure Laterality Date  . APPENDECTOMY    . Back Pain     going to chiropracator for back and hip pain  . BACK SURGERY     x2  . bladder tack    . CATARACT EXTRACTION, BILATERAL  2017  . CHOLECYSTECTOMY    . COLONOSCOPY  06/16/2005   internal hemorrhoids, L side diverticula - Dr. Gala Romney  . COLONOSCOPY N/A 05/02/2013   Dr. Gala Romney- normal rectum, scattered L sided diverticula. bx= tubular adenoma  . ESOPHAGOGASTRODUODENOSCOPY  12/21/2006     Dr. Vivi Ferns esophagus/Patulous EG junction, small to moderate sized hiatal hernia,   multiple fundal gland type appearing polyps biopsied, otherwise normal  . FOOT SURGERY  cyst removed  . TOTAL ABDOMINAL HYSTERECTOMY    . TOTAL HIP ARTHROPLASTY Right 01/11/2017   Procedure: RIGHT TOTAL HIP ARTHROPLASTY ANTERIOR APPROACH;  Surgeon: Gaynelle Arabian, MD;  Location: [facility];  Service: Orthopedics;  Laterality: Right;  . WRIST SURGERY      Family History Family History  Problem Relation Age of Onset  . Stroke Father   . Colon cancer Neg Hx      Social History  reports that she has never smoked. She has never used smokeless tobacco. She reports that she does not  drink alcohol or use drugs.  Medications  Current Outpatient Medications:  .  aspirin EC 81 MG tablet, Take 81 mg by mouth daily., Disp: , Rfl:  .  Bioflavonoid Products (BIOFLEX PO), Bioflex, Disp: , Rfl:  .  Cholecalciferol (VITAMIN D) 2000 units CAPS, Take by mouth daily., Disp: , Rfl:  .  DEXILANT 60 MG capsule, TAKE 1 CAPSULE DAILY, Disp: 90 capsule, Rfl: 2 .  ESTRACE VAGINAL 0.1 MG/GM vaginal cream, , Disp: , Rfl:  .  fexofenadine (ALLEGRA) 180 MG tablet, Take 180 mg by mouth daily., Disp: , Rfl:  .  fludrocortisone (FLORINEF) 0.1 MG tablet, TAKE 1 TABLET THREE TIMES A WEEK, Disp: 36 tablet, Rfl: 3 .  fluticasone (FLONASE) 50 MCG/ACT nasal spray, Place 2 sprays into the nose as needed for allergies. , Disp: , Rfl:  .  furosemide (LASIX) 20 MG tablet, Take 1 tablet (20 mg total) by mouth daily. Pt states she does not take it on Sundays and when she has a doctor's appt., Disp: 90 tablet, Rfl: 3 .  Lactobacillus (PROBIOTIC ACIDOPHILUS PO), Take by mouth daily., Disp: , Rfl:  .  levothyroxine (SYNTHROID, LEVOTHROID) 75 MCG tablet, Take 75 mcg by mouth daily before breakfast., Disp: , Rfl:  .  methenamine (HIPREX) 1 g tablet, Take 1 g by mouth 2 (two) times daily with a meal., Disp: , Rfl:  .  montelukast (SINGULAIR) 10 MG tablet, Take 10 mg by mouth at bedtime. , Disp: , Rfl:  .  Multiple Vitamin (MULTIVITAMIN) tablet, Take 1 tablet by mouth daily., Disp: , Rfl:  .  nitrofurantoin (MACRODANTIN) 50 MG capsule, Take 50 mg by mouth daily., Disp: , Rfl:  .  nitroGLYCERIN (NITROSTAT) 0.4 MG SL tablet, Place 1 tablet (0.4 mg total) under the tongue every 5 (five) minutes as needed for chest pain., Disp: 25 tablet, Rfl: 3 .  Omega-3 Fatty Acids (FISH OIL) 1200 MG CAPS, Take by mouth daily., Disp: , Rfl:  .  Polyethyl Glycol-Propyl Glycol (SYSTANE OP), Apply 1 drop to eye daily. , Disp: , Rfl:  .  polyethylene glycol (MIRALAX / GLYCOLAX) packet, Take 17 g by mouth daily as needed., Disp: , Rfl:  .   polyvinyl alcohol (LIQUIFILM TEARS) 1.4 % ophthalmic solution, Place 1 drop into both eyes as needed for dry eyes., Disp: 15 mL, Rfl: 0 .  topiramate (TOPAMAX) 50 MG tablet, 2 tablets in the morning, 3 tablets at night, Disp: 450 tablet, Rfl: 2 .  triamcinolone cream (KENALOG) 0.1 %, triamcinolone acetonide 0.1 % topical cream, Disp: , Rfl:   Allergies Cephalexin; Doxazosin mesylate; Levofloxacin; Codeine; Indomethacin; Ivp dye [iodinated diagnostic agents]; Morphine; Sulfamethoxazole-trimethoprim; and Penicillins  Review of Systems Review of Systems - Oncology ROS negative other than related to comorbidity   Physical Exam  Vitals Wt Readings from Last 3 Encounters:  08/29/18 179 lb 12.8 oz (81.6 kg)  08/16/18 182 lb 9.6 oz (82.8 kg)  03/28/18 189 lb (85.7 kg)   Temp Readings from Last 3 Encounters:  08/29/18 98.2 F (36.8 C) (Oral)  08/16/18 97.6 F (36.4 C) (Oral)  01/30/18 (!) 97.5 F (36.4 C) (Oral)   BP Readings from Last 3 Encounters:  08/29/18 116/63  08/16/18 107/72  03/28/18 132/79   Pulse Readings from Last 3 Encounters:  08/29/18 65  08/16/18 64  03/28/18 61    Constitutional: Well-developed, well-nourished, and in no distress.   HENT: Head: Normocephalic and atraumatic.  Mouth/Throat: No oropharyngeal exudate. Mucosa moist. Eyes: Pupils are equal, round, and reactive to light. Conjunctivae are normal. No scleral icterus.  Neck: Normal range of motion. Neck supple. No JVD present.  Cardiovascular: Normal rate, regular rhythm and normal heart sounds.  Exam reveals no gallop and no friction rub.   No murmur heard. Pulmonary/Chest: Effort normal and breath sounds normal. No respiratory distress. No wheezes.No rales.  Abdominal: Soft. Bowel sounds are normal. No distension. There is no tenderness. There is no guarding.  Musculoskeletal: No edema or tenderness.  Lymphadenopathy: No cervical, axillary or supraclavicular adenopathy.  Neurological: Alert and  oriented to person, place, and time. No cranial nerve deficit.  Skin: Skin is warm and dry. No rash noted. No erythema. No pallor.  Psychiatric: Affect and judgment normal.   Labs No visits with results within 3 Day(s) from this visit.  Latest known visit with results is:  Office Visit on 08/16/2018  Component Date Value Ref Range Status  . WBC 08/16/2018 4.4  3.8 - 10.8 Thousand/uL Final  . RBC 08/16/2018 4.72  3.80 - 5.10 Million/uL Final  . Hemoglobin 08/16/2018 12.8  11.7 - 15.5 g/dL Final  . HCT 08/16/2018 38.7  35.0 - 45.0 % Final  . MCV 08/16/2018 82.0  80.0 - 100.0 fL Final  . MCH 08/16/2018 27.1  27.0 - 33.0 pg Final  . MCHC 08/16/2018 33.1  32.0 - 36.0 g/dL Final  . RDW 08/16/2018 14.9  11.0 - 15.0 % Final  . Platelets 08/16/2018 136* 140 - 400 Thousand/uL Final  . MPV 08/16/2018 10.2  7.5 - 12.5 fL Final  . Neutro Abs 08/16/2018 2,526  1,500 - 7,800 cells/uL Final  . Lymphs Abs 08/16/2018 1,170  850 - 3,900 cells/uL Final  . WBC mixed population 08/16/2018 444  200 - 950 cells/uL Final  . Eosinophils Absolute 08/16/2018 238  15 - 500 cells/uL Final  . Basophils Absolute 08/16/2018 22  0 - 200 cells/uL Final  . Neutrophils Relative % 08/16/2018 57.4  % Final  . Total Lymphocyte 08/16/2018 26.6  % Final  . Monocytes Relative 08/16/2018 10.1  % Final  . Eosinophils Relative 08/16/2018 5.4  % Final  . Basophils Relative 08/16/2018 0.5  % Final  . Glucose, Bld 08/16/2018 86  65 - 139 mg/dL Final   Comment: .        Non-fasting reference interval .   . BUN 08/16/2018 12  7 - 25 mg/dL Final  . Creat 08/16/2018 0.82  0.60 - 0.88 mg/dL Final   Comment: For patients >52 years of age, the reference limit for Creatinine is approximately 13% higher for people identified as African-American. .   . GFR, Est Non African American 08/16/2018 65  > OR = 60 mL/min/1.67m2 Final  . GFR, Est African American 08/16/2018 75  > OR = 60 mL/min/1.56m2 Final  . BUN/Creatinine Ratio  40/97/3532 NOT APPLICABLE  6 - 22 (calc) Final  . Sodium 08/16/2018 143  135 - 146 mmol/L  Final  . Potassium 08/16/2018 4.0  3.5 - 5.3 mmol/L Final  . Chloride 08/16/2018 109  98 - 110 mmol/L Final  . CO2 08/16/2018 27  20 - 32 mmol/L Final  . Calcium 08/16/2018 9.4  8.6 - 10.4 mg/dL Final  . Total Protein 08/16/2018 6.3  6.1 - 8.1 g/dL Final  . Albumin 08/16/2018 4.0  3.6 - 5.1 g/dL Final  . Globulin 08/16/2018 2.3  1.9 - 3.7 g/dL (calc) Final  . AG Ratio 08/16/2018 1.7  1.0 - 2.5 (calc) Final  . Total Bilirubin 08/16/2018 0.6  0.2 - 1.2 mg/dL Final  . Alkaline phosphatase (APISO) 08/16/2018 63  33 - 130 U/L Final  . AST 08/16/2018 21  10 - 35 U/L Final  . ALT 08/16/2018 12  6 - 29 U/L Final  . TSH 08/16/2018 1.73  0.40 - 4.50 mIU/L Final     Pathology No orders of the defined types were placed in this encounter.      Zoila Shutter MD

## 2018-09-04 NOTE — Progress Notes (Signed)
I'm not sure when Dr. Gala Collins ever asked her to stop Benefiber. If she feels he did, she can continue with just Miralax. It is not harmful to have added fiber in her diet.

## 2018-09-12 DIAGNOSIS — Z85828 Personal history of other malignant neoplasm of skin: Secondary | ICD-10-CM | POA: Diagnosis not present

## 2018-09-12 DIAGNOSIS — L570 Actinic keratosis: Secondary | ICD-10-CM | POA: Diagnosis not present

## 2018-09-12 DIAGNOSIS — L280 Lichen simplex chronicus: Secondary | ICD-10-CM | POA: Diagnosis not present

## 2018-09-13 DIAGNOSIS — Z6827 Body mass index (BMI) 27.0-27.9, adult: Secondary | ICD-10-CM | POA: Diagnosis not present

## 2018-09-13 DIAGNOSIS — K1120 Sialoadenitis, unspecified: Secondary | ICD-10-CM | POA: Diagnosis not present

## 2018-09-13 DIAGNOSIS — R51 Headache: Secondary | ICD-10-CM | POA: Diagnosis not present

## 2018-09-16 ENCOUNTER — Other Ambulatory Visit: Payer: Self-pay | Admitting: Gastroenterology

## 2018-09-18 DIAGNOSIS — Z961 Presence of intraocular lens: Secondary | ICD-10-CM | POA: Diagnosis not present

## 2018-10-02 ENCOUNTER — Ambulatory Visit (INDEPENDENT_AMBULATORY_CARE_PROVIDER_SITE_OTHER): Payer: Medicare Other | Admitting: Adult Health

## 2018-10-02 ENCOUNTER — Encounter: Payer: Self-pay | Admitting: Adult Health

## 2018-10-02 VITALS — BP 122/68 | HR 62 | Ht 67.0 in | Wt 180.0 lb

## 2018-10-02 DIAGNOSIS — R51 Headache: Secondary | ICD-10-CM | POA: Diagnosis not present

## 2018-10-02 DIAGNOSIS — R569 Unspecified convulsions: Secondary | ICD-10-CM

## 2018-10-02 DIAGNOSIS — R269 Unspecified abnormalities of gait and mobility: Secondary | ICD-10-CM

## 2018-10-02 DIAGNOSIS — M792 Neuralgia and neuritis, unspecified: Secondary | ICD-10-CM | POA: Diagnosis not present

## 2018-10-02 DIAGNOSIS — R519 Headache, unspecified: Secondary | ICD-10-CM

## 2018-10-02 NOTE — Progress Notes (Signed)
I agree with the above plan 

## 2018-10-02 NOTE — Patient Instructions (Signed)
Your Plan:  Continue Topamax 50 mg twice a day Referral to PT  If your symptoms worsen or you develop new symptoms please let us know.    Thank you for coming to see [place] at [facility]. I hope we have been able to provide you high quality care today.  You may receive a patient satisfaction survey over the next few weeks. We would appreciate your feedback and comments so that we may continue to improve ourselves and the health of our patients.

## 2018-10-02 NOTE — Progress Notes (Signed)
PATIENT: [name] [facility] DOB: 1930/02/13  REASON FOR VISIT: follow up HISTORY FROM: patient  HISTORY OF PRESENT ILLNESS: Today 10/02/18 [name] is an 84 year old female with a history of migraine headaches, seizures, peripheral neuropathy. She returns today for follow-up.  She continues on Topamax 100 mg twice a day.  She states that she continues to have burning and tingling in the legs but this is manageable.  She states that her balance has been off.  She has not had any falls but has had some near misses.  She does use a cane when ambulating.  She continues to have frequent headaches but has been on several medications in the past.  She does wake up with headaches.  She denies snoring.  Denies any significant daytime sleepiness.  She denies any seizure events.  She returns today for evaluation.  HISTORY 03/28/18 [name] is an 84 year old female with a history of migraine headaches, seizure events and peripheral neuropathy.  She returns today for follow-up.  The patient is currently on Topamax taking 100 mg in the morning and 150 mg at evening.  She states that the increase in Topamax has not improved her peripheral neuropathy.  She still states that she has burning and tingling pain in the legs.  Some days are worse than others.  She is using an over-the-counter diabetic cream that offers her good benefit but she only uses it in the morning and at night because she wears compression stockings during the day.  She states that in the past she has been on gabapentin but this was discontinued due to leg swelling.  The patient reports that her headaches are about the same.  Some days are worse than others.  She denies any seizure events.  She does have trouble with ambulation due to neuropathy.  She returns today for an evaluation.   REVIEW OF SYSTEMS: Out of a complete 14 system review of symptoms, the patient complains only of the following symptoms, and all other reviewed systems are  negative.  See HPI  ALLERGIES: Allergies  Allergen Reactions  . Cephalexin Anaphylaxis and Swelling  . Doxazosin Mesylate Other (See Comments)    Made patient EXTREMELY sick.   . Levofloxacin Anaphylaxis and Rash  . Codeine Other (See Comments)    Hallucinations  . Indomethacin Other (See Comments)    Severe Headache   . Ivp Dye [Iodinated Diagnostic Agents] Hives  . Morphine Other (See Comments)    Hallucinations   . Sulfamethoxazole-Trimethoprim Other (See Comments)    Unknown   . Penicillins Other (See Comments)    Made patient go into shock Has patient had a PCN reaction causing immediate rash, facial/tongue/throat swelling, SOB or lightheadedness with hypotension: Yes Has patient had a PCN reaction causing severe rash involving mucus membranes or skin necrosis: No Has patient had a PCN reaction that required hospitalization No Has patient had a PCN reaction occurring within the last 10 years: No If all of the above answers are "NO", then may proceed with Cephalosporin use.     HOME MEDICATIONS: Outpatient Medications Prior to Visit  Medication Sig Dispense Refill  . aspirin EC 81 MG tablet Take 81 mg by mouth daily.    Marland Kitchen Bioflavonoid Products (BIOFLEX PO) Bioflex    . Cholecalciferol (VITAMIN D) 2000 units CAPS Take by mouth daily.    Marland Kitchen DEXILANT 60 MG capsule TAKE 1 CAPSULE DAILY 90 capsule 4  . ESTRACE VAGINAL 0.1 MG/GM vaginal cream     .  fexofenadine (ALLEGRA) 180 MG tablet Take 180 mg by mouth daily.    . fludrocortisone (FLORINEF) 0.1 MG tablet TAKE 1 TABLET THREE TIMES A WEEK 36 tablet 3  . fluticasone (FLONASE) 50 MCG/ACT nasal spray Place 2 sprays into the nose as needed for allergies.     . furosemide (LASIX) 20 MG tablet Take 1 tablet (20 mg total) by mouth daily. Pt states she does not take it on Sundays and when she has a doctor's appt. 90 tablet 3  . Lactobacillus (PROBIOTIC ACIDOPHILUS PO) Take by mouth daily.    Marland Kitchen levothyroxine (SYNTHROID, LEVOTHROID) 75  MCG tablet Take 75 mcg by mouth daily before breakfast.    . methenamine (HIPREX) 1 g tablet Take 1 g by mouth 2 (two) times daily with a meal.    . montelukast (SINGULAIR) 10 MG tablet Take 10 mg by mouth at bedtime.     . Multiple Vitamin (MULTIVITAMIN) tablet Take 1 tablet by mouth daily.    . nitrofurantoin (MACRODANTIN) 50 MG capsule Take 50 mg by mouth daily.    . nitroGLYCERIN (NITROSTAT) 0.4 MG SL tablet Place 1 tablet (0.4 mg total) under the tongue every 5 (five) minutes as needed for chest pain. 25 tablet 3  . Omega-3 Fatty Acids (FISH OIL) 1200 MG CAPS Take by mouth daily.    [name] Glycol-Propyl Glycol (SYSTANE OP) Apply 1 drop to eye daily.     . polyethylene glycol (MIRALAX / GLYCOLAX) packet Take 17 g by mouth daily as needed.    . polyvinyl alcohol (LIQUIFILM TEARS) 1.4 % ophthalmic solution Place 1 drop into both eyes as needed for dry eyes. 15 mL 0  . topiramate (TOPAMAX) 50 MG tablet 2 tablets in the morning, 3 tablets at night 450 tablet 2  . triamcinolone cream (KENALOG) 0.1 % triamcinolone acetonide 0.1 % topical cream     No facility-administered medications prior to visit.     PAST MEDICAL HISTORY: Past Medical History:  Diagnosis Date  . Arthritis   . Chronic diastolic heart failure ([place])   . Diverticulosis of colon (without mention of hemorrhage)    TCS by Dr. Gala Romney  . GERD (gastroesophageal reflux disease)   . Hemorrhoids, internal    TCS by Dr. Gala Romney  . Irritable bowel syndrome   . Migraine   . Mitral regurgitation   . Peripheral polyneuropathy    Confirmed by EMG and nerve conduction velocities   . Pulmonary nodule   . Seizures ([place]) 2011  . Syncope    Neurocardiogenic - positive tilt table test, on Florinef  . Tubular adenoma   . UTI (urinary tract infection)    Recurrent    PAST SURGICAL HISTORY: Past Surgical History:  Procedure Laterality Date  . APPENDECTOMY    . Back Pain     going to chiropracator for back and hip pain  . BACK  SURGERY     x2  . bladder tack    . CATARACT EXTRACTION, BILATERAL  2017  . CHOLECYSTECTOMY    . COLONOSCOPY  06/16/2005   internal hemorrhoids, L side diverticula - Dr. Gala Romney  . COLONOSCOPY N/A 05/02/2013   Dr. Gala Romney- normal rectum, scattered L sided diverticula. bx= tubular adenoma  . ESOPHAGOGASTRODUODENOSCOPY  12/21/2006     Dr. Vivi Ferns esophagus/Patulous EG junction, small to moderate sized hiatal hernia,   multiple fundal gland type appearing polyps biopsied, otherwise normal  . FOOT SURGERY     cyst removed  . TOTAL ABDOMINAL HYSTERECTOMY    .  TOTAL HIP ARTHROPLASTY Right 01/11/2017   Procedure: RIGHT TOTAL HIP ARTHROPLASTY ANTERIOR APPROACH;  Surgeon: Gaynelle Arabian, MD;  Location: [facility];  Service: Orthopedics;  Laterality: Right;  . WRIST SURGERY      FAMILY HISTORY: Family History  Problem Relation Age of Onset  . Stroke Father   . Colon cancer Neg Hx     SOCIAL HISTORY: Social History   Socioeconomic History  . Marital status: Widowed    Spouse name: Not on file  . Number of children: 4  . Years of education: college  . Highest education level: Not on file  Occupational History    Comment: Retired  Scientific laboratory technician  . Financial resource strain: Not on file  . Food insecurity:    Worry: Not on file    Inability: Not on file  . Transportation needs:    Medical: Not on file    Non-medical: Not on file  Tobacco Use  . Smoking status: Never Smoker  . Smokeless tobacco: Never Used  . Tobacco comment: tobacco use - no  Substance and Sexual Activity  . Alcohol use: No    Alcohol/week: 0.0 standard drinks  . Drug use: No  . Sexual activity: Not Currently  Lifestyle  . Physical activity:    Days per week: Not on file    Minutes per session: Not on file  . Stress: Not on file  Relationships  . Social connections:    Talks on phone: Not on file    Gets together: Not on file    Attends religious service: Not on file    Active member of club or organization:  Not on file    Attends meetings of clubs or organizations: Not on file    Relationship status: Not on file  . Intimate partner violence:    Fear of current or ex partner: Not on file    Emotionally abused: Not on file    Physically abused: Not on file    Forced sexual activity: Not on file  Other Topics Concern  . Not on file  Social History Narrative   Retired, widowed, education college education .   Right handed.   Caffeine one cup of coffee daily.       PHYSICAL EXAM  Vitals:   10/02/18 1448  BP: 122/68  Pulse: 62  Weight: 180 lb (81.6 kg)  Height: 5\' 7"  (1.702 m)   Body mass index is 28.19 kg/m.  Generalized: Well developed, in no acute distress   Neurological examination  Mentation: Alert oriented to time, place, history taking. Follows all commands speech and language fluent Cranial nerve II-XII: Pupils were equal round reactive to light. Extraocular movements were full, visual field were full on confrontational test. Facial sensation and strength were normal. Uvula tongue midline. Head turning and shoulder shrug  were normal and symmetric.  Neck circumference 15 inches, Mallampati 2+ Motor: The motor testing reveals 5 over 5 strength of all 4 extremities. Good symmetric motor tone is noted throughout.  Sensory: Sensory testing is intact to soft touch on all 4 extremities. No evidence of extinction is noted.  Coordination: Cerebellar testing reveals good finger-nose-finger and heel-to-shin bilaterally.  Gait and station: Kasandra Knudsen is used when ambulating.  Gait is slightly unsteady.  Tandem gait noted. Reflexes: Deep tendon reflexes are symmetric and normal bilaterally.   DIAGNOSTIC DATA (LABS, IMAGING, TESTING) - I reviewed patient records, labs, notes, testing and imaging myself where available.  Lab Results  Component Value Date  WBC 4.4 08/16/2018   HGB 12.8 08/16/2018   HCT 38.7 08/16/2018   MCV 82.0 08/16/2018   PLT 136 (L) 08/16/2018      Component Value  Date/Time   NA 143 08/16/2018 1153   K 4.0 08/16/2018 1153   CL 109 08/16/2018 1153   CO2 27 08/16/2018 1153   GLUCOSE 86 08/16/2018 1153   BUN 12 08/16/2018 1153   CREATININE 0.82 08/16/2018 1153   CALCIUM 9.4 08/16/2018 1153   PROT 6.3 08/16/2018 1153   ALBUMIN 3.9 01/06/2017 1518   AST 21 08/16/2018 1153   ALT 12 08/16/2018 1153   ALKPHOS 57 01/06/2017 1518   BILITOT 0.6 08/16/2018 1153   GFRNONAA 65 08/16/2018 1153   GFRAA 75 08/16/2018 1153    Lab Results  Component Value Date   TSH 1.73 08/16/2018      ASSESSMENT AND PLAN 84 y.o. year old female  has a past medical history of Arthritis, Chronic diastolic heart failure ([place]), Diverticulosis of colon (without mention of hemorrhage), GERD (gastroesophageal reflux disease), Hemorrhoids, internal, Irritable bowel syndrome, Migraine, Mitral regurgitation, Peripheral polyneuropathy, Pulmonary nodule, Seizures ([place]) (2011), Syncope, Tubular adenoma, and UTI (urinary tract infection). here with :  1.  Seizures 2.  Headaches 3.  Peripheral neuropathy 4.  Abnormality of gait  Overall the patient has remained stable.  She will continue on Topamax 100 mg twice a day.  I will refer to physical therapy for gait and balance training.  She is advised that if her symptoms worsen or she develops new symptoms she should let us know.  She will follow-up in 6 months or sooner if needed.     Ward Givens, MSN, NP-C 10/02/2018, 2:49 PM [facility] [address], [place], [place] [address] [phone]

## 2018-10-03 ENCOUNTER — Telehealth: Payer: Self-pay | Admitting: Adult Health

## 2018-10-03 NOTE — Telephone Encounter (Signed)
Pt has called asking that her PT be only scheduled in [place] because of this city only being 10 mins. Away from her.  Please call

## 2018-10-03 NOTE — Telephone Encounter (Signed)
Sent to [place] is [employer] [phone] fax [phone] . Patient is aware.

## 2018-10-08 DIAGNOSIS — K210 Gastro-esophageal reflux disease with esophagitis: Secondary | ICD-10-CM | POA: Diagnosis not present

## 2018-10-08 DIAGNOSIS — G40909 Epilepsy, unspecified, not intractable, without status epilepticus: Secondary | ICD-10-CM | POA: Diagnosis not present

## 2018-10-08 DIAGNOSIS — R7301 Impaired fasting glucose: Secondary | ICD-10-CM | POA: Diagnosis not present

## 2018-10-08 DIAGNOSIS — R5383 Other fatigue: Secondary | ICD-10-CM | POA: Diagnosis not present

## 2018-10-08 DIAGNOSIS — R42 Dizziness and giddiness: Secondary | ICD-10-CM | POA: Diagnosis not present

## 2018-10-08 DIAGNOSIS — E039 Hypothyroidism, unspecified: Secondary | ICD-10-CM | POA: Diagnosis not present

## 2018-10-09 DIAGNOSIS — M25579 Pain in unspecified ankle and joints of unspecified foot: Secondary | ICD-10-CM | POA: Diagnosis not present

## 2018-10-09 DIAGNOSIS — M79671 Pain in right foot: Secondary | ICD-10-CM | POA: Diagnosis not present

## 2018-10-09 DIAGNOSIS — M79672 Pain in left foot: Secondary | ICD-10-CM | POA: Diagnosis not present

## 2018-10-11 DIAGNOSIS — Z6827 Body mass index (BMI) 27.0-27.9, adult: Secondary | ICD-10-CM | POA: Diagnosis not present

## 2018-10-11 DIAGNOSIS — Z0001 Encounter for general adult medical examination with abnormal findings: Secondary | ICD-10-CM | POA: Diagnosis not present

## 2018-10-12 DIAGNOSIS — R269 Unspecified abnormalities of gait and mobility: Secondary | ICD-10-CM | POA: Diagnosis not present

## 2018-10-15 DIAGNOSIS — R269 Unspecified abnormalities of gait and mobility: Secondary | ICD-10-CM | POA: Diagnosis not present

## 2018-10-17 DIAGNOSIS — G40909 Epilepsy, unspecified, not intractable, without status epilepticus: Secondary | ICD-10-CM | POA: Diagnosis not present

## 2018-10-17 DIAGNOSIS — K210 Gastro-esophageal reflux disease with esophagitis: Secondary | ICD-10-CM | POA: Diagnosis not present

## 2018-10-18 DIAGNOSIS — Z6826 Body mass index (BMI) 26.0-26.9, adult: Secondary | ICD-10-CM | POA: Diagnosis not present

## 2018-10-18 DIAGNOSIS — N952 Postmenopausal atrophic vaginitis: Secondary | ICD-10-CM | POA: Diagnosis not present

## 2018-10-18 DIAGNOSIS — N3281 Overactive bladder: Secondary | ICD-10-CM | POA: Diagnosis not present

## 2018-10-18 DIAGNOSIS — Z87440 Personal history of urinary (tract) infections: Secondary | ICD-10-CM | POA: Diagnosis not present

## 2018-10-18 DIAGNOSIS — N3946 Mixed incontinence: Secondary | ICD-10-CM | POA: Diagnosis not present

## 2018-10-19 ENCOUNTER — Ambulatory Visit (HOSPITAL_COMMUNITY): Payer: Medicare Other | Admitting: Internal Medicine

## 2018-10-19 DIAGNOSIS — R269 Unspecified abnormalities of gait and mobility: Secondary | ICD-10-CM | POA: Diagnosis not present

## 2018-10-22 DIAGNOSIS — R269 Unspecified abnormalities of gait and mobility: Secondary | ICD-10-CM | POA: Diagnosis not present

## 2018-10-26 DIAGNOSIS — R269 Unspecified abnormalities of gait and mobility: Secondary | ICD-10-CM | POA: Diagnosis not present

## 2018-10-29 DIAGNOSIS — R269 Unspecified abnormalities of gait and mobility: Secondary | ICD-10-CM | POA: Diagnosis not present

## 2018-10-30 DIAGNOSIS — N952 Postmenopausal atrophic vaginitis: Secondary | ICD-10-CM | POA: Diagnosis not present

## 2018-10-30 DIAGNOSIS — N3946 Mixed incontinence: Secondary | ICD-10-CM | POA: Diagnosis not present

## 2018-10-30 DIAGNOSIS — N3281 Overactive bladder: Secondary | ICD-10-CM | POA: Diagnosis not present

## 2018-10-30 DIAGNOSIS — R3129 Other microscopic hematuria: Secondary | ICD-10-CM | POA: Diagnosis not present

## 2018-10-30 DIAGNOSIS — R300 Dysuria: Secondary | ICD-10-CM | POA: Diagnosis not present

## 2018-10-30 DIAGNOSIS — R8290 Unspecified abnormal findings in urine: Secondary | ICD-10-CM | POA: Diagnosis not present

## 2018-11-02 DIAGNOSIS — R269 Unspecified abnormalities of gait and mobility: Secondary | ICD-10-CM | POA: Diagnosis not present

## 2018-11-05 ENCOUNTER — Other Ambulatory Visit: Payer: Self-pay

## 2018-11-05 ENCOUNTER — Other Ambulatory Visit: Payer: Self-pay | Admitting: Neurology

## 2018-11-05 DIAGNOSIS — B9629 Other Escherichia coli [E. coli] as the cause of diseases classified elsewhere: Secondary | ICD-10-CM | POA: Diagnosis not present

## 2018-11-05 DIAGNOSIS — N390 Urinary tract infection, site not specified: Secondary | ICD-10-CM | POA: Diagnosis not present

## 2018-11-05 DIAGNOSIS — R269 Unspecified abnormalities of gait and mobility: Secondary | ICD-10-CM | POA: Diagnosis not present

## 2018-11-05 NOTE — Telephone Encounter (Signed)
Pt returning Nps call stating she is taking 2 tablets in the am and 3 in the pm

## 2018-11-05 NOTE — Telephone Encounter (Signed)
I called the patient to clarify how she is taking topamax. LVM for her to call office.

## 2018-11-06 ENCOUNTER — Ambulatory Visit (INDEPENDENT_AMBULATORY_CARE_PROVIDER_SITE_OTHER): Payer: Medicare Other | Admitting: Internal Medicine

## 2018-11-06 ENCOUNTER — Encounter: Payer: Self-pay | Admitting: Internal Medicine

## 2018-11-06 ENCOUNTER — Other Ambulatory Visit: Payer: Self-pay

## 2018-11-06 VITALS — BP 132/81 | HR 68 | Temp 96.5°F | Ht 68.0 in | Wt 179.0 lb

## 2018-11-06 DIAGNOSIS — B9629 Other Escherichia coli [E. coli] as the cause of diseases classified elsewhere: Secondary | ICD-10-CM | POA: Diagnosis not present

## 2018-11-06 DIAGNOSIS — N390 Urinary tract infection, site not specified: Secondary | ICD-10-CM | POA: Diagnosis not present

## 2018-11-06 DIAGNOSIS — K5909 Other constipation: Secondary | ICD-10-CM

## 2018-11-06 DIAGNOSIS — K219 Gastro-esophageal reflux disease without esophagitis: Secondary | ICD-10-CM

## 2018-11-06 MED ORDER — PANTOPRAZOLE SODIUM 40 MG PO TBEC: 40.0000 mg | DELAYED_RELEASE_TABLET | Freq: Every day | ORAL | 11 refills | Status: DC

## 2018-11-06 NOTE — Patient Instructions (Signed)
Stop Dexilant; begin Protonix 40 mg daily for reflux.  Dispense 30 with 11 refills  I recommend you take MiraLAX 17 g orally every day.  You may take it in the morning or the evening.  I would like you to take it every day whether you think you needed or not.  Only reason not to take it is if you get diarrhea  Office visit with me in 4 months.       Thank you for entrusting me with your care. I appreciate the opportunity to create valuable relationships with patients and family members. You may receive a questionnaire in the mail regarding your visit here today.  It would be appreciated if you would take the time to return it.  If you were not completely satisfied with your experience, I would love to discuss any concerns with you.   Bridgette Habermann, M.D. [facility]

## 2018-11-06 NOTE — Progress Notes (Signed)
Primary Care Physician:  Manon Hilding, MD Primary Gastroenterologist:  Dr. Gala Romney  Pre-Procedure History & Physical: HPI:  [name] is a 84 y.o. female here for follow-up of GERD and constipation.  Complains of persisting intermittent bloating and constipation.  Says she takes MiraLAX when she does not go to the bathroom;  BM's 2-3 times weekly.  She says MiraLAX works when she takes it.  She certainly has not had any diarrhea.  No bleeding.  Abdominal bloating occasionally related to constipation.  Reflux symptoms well controlled on Dexilant.  However, [employer] will no longer pay for Dexilant. Chronic thrombocytopenia.  Recent evaluation by hematology negative.  Recently diagnosed with a urinary tract infection.  She is getting parenteral antibiotics through [facility] urologist per her report.  Has issues with her salivary glands;  ? Sialoadenitis.  Upcoming appointment with Dr. Melene Plan in the works. Of note, patient did not take Amitiza-concerned about side effects.   Past Medical History:  Diagnosis Date  . Arthritis   . Chronic diastolic heart failure ([place])   . Diverticulosis of colon (without mention of hemorrhage)    TCS by Dr. Gala Romney  . GERD (gastroesophageal reflux disease)   . Hemorrhoids, internal    TCS by Dr. Gala Romney  . Irritable bowel syndrome   . Migraine   . Mitral regurgitation   . Peripheral polyneuropathy    Confirmed by EMG and nerve conduction velocities   . Pulmonary nodule   . Seizures ([place]) 2011  . Syncope    Neurocardiogenic - positive tilt table test, on Florinef  . Tubular adenoma   . UTI (urinary tract infection)    Recurrent    Past Surgical History:  Procedure Laterality Date  . APPENDECTOMY    . Back Pain     going to chiropracator for back and hip pain  . BACK SURGERY     x2  . bladder tack    . CATARACT EXTRACTION, BILATERAL  2017  . CHOLECYSTECTOMY    . COLONOSCOPY  06/16/2005   internal hemorrhoids, L side diverticula - Dr. Gala Romney   . COLONOSCOPY N/A 05/02/2013   Dr. Gala Romney- normal rectum, scattered L sided diverticula. bx= tubular adenoma  . ESOPHAGOGASTRODUODENOSCOPY  12/21/2006     Dr. Vivi Ferns esophagus/Patulous EG junction, small to moderate sized hiatal hernia,   multiple fundal gland type appearing polyps biopsied, otherwise normal  . FOOT SURGERY     cyst removed  . TOTAL ABDOMINAL HYSTERECTOMY    . TOTAL HIP ARTHROPLASTY Right 01/11/2017   Procedure: RIGHT TOTAL HIP ARTHROPLASTY ANTERIOR APPROACH;  Surgeon: Gaynelle Arabian, MD;  Location: [facility];  Service: Orthopedics;  Laterality: Right;  . WRIST SURGERY      Prior to Admission medications   Medication Sig Start Date End Date Taking? Authorizing Provider  aspirin EC 81 MG tablet Take 81 mg by mouth daily.   Yes [provider]  Bioflavonoid Products (BIOFLEX PO) Bioflex   Yes [provider]  Cholecalciferol (VITAMIN D) 2000 units CAPS Take by mouth daily.   Yes [provider]  DEXILANT 60 MG capsule TAKE 1 CAPSULE DAILY 09/17/18  Yes Annitta Needs, NP  ESTRACE VAGINAL 0.1 MG/GM vaginal cream  05/16/17  Yes [provider]  fexofenadine (ALLEGRA) 180 MG tablet Take 180 mg by mouth daily.   Yes [provider]  fludrocortisone (FLORINEF) 0.1 MG tablet TAKE 1 TABLET THREE TIMES A WEEK 02/15/18  Yes Satira Sark, MD  fluticasone [facility]) 50  MCG/ACT nasal spray Place 2 sprays into the nose as needed for allergies.    Yes [provider]  furosemide (LASIX) 20 MG tablet Take 1 tablet (20 mg total) by mouth daily. Pt states she does not take it on Sundays and when she has a doctor's appt. 07/02/18  Yes Satira Sark, MD  Lactobacillus (PROBIOTIC ACIDOPHILUS PO) Take by mouth daily.   Yes [provider]  levothyroxine (SYNTHROID, LEVOTHROID) 75 MCG tablet Take 75 mcg by mouth daily before breakfast.   Yes [provider]  methenamine (HIPREX) 1 g tablet Take 1 g by mouth 2 (two) times  daily with a meal.   Yes [provider]  montelukast (SINGULAIR) 10 MG tablet Take 10 mg by mouth at bedtime.    Yes [provider]  Multiple Vitamin (MULTIVITAMIN) tablet Take 1 tablet by mouth daily.   Yes [provider]  nitroGLYCERIN (NITROSTAT) 0.4 MG SL tablet Place 1 tablet (0.4 mg total) under the tongue every 5 (five) minutes as needed for chest pain. 10/10/17  Yes Satira Sark, MD  Omega-3 Fatty Acids (FISH OIL) 1200 MG CAPS Take by mouth daily.   Yes [provider]  Polyethyl Glycol-Propyl Glycol (SYSTANE OP) Apply 1 drop to eye daily.    Yes [provider]  polyethylene glycol (MIRALAX / GLYCOLAX) packet Take 17 g by mouth 3 (three) times daily as needed.    Yes [provider]  polyvinyl alcohol (LIQUIFILM TEARS) 1.4 % ophthalmic solution Place 1 drop into both eyes as needed for dry eyes. 01/13/17  Yes Perkins, Alexzandrew L, PA-C  Probiotic Product (PROBIOTIC PO) Take by mouth daily.   Yes [provider]  topiramate (TOPAMAX) 50 MG tablet TAKE 2 TABLETS IN THE MORNING AND 3 TABLETS AT NIGHT 11/05/18  Yes Ward Givens, NP  triamcinolone cream (KENALOG) 0.1 % triamcinolone acetonide 0.1 % topical cream   Yes [provider]  nitrofurantoin (MACRODANTIN) 50 MG capsule Take 50 mg by mouth daily.    [provider]    Allergies as of 11/06/2018 - Review Complete 11/06/2018  Allergen Reaction Noted  . Cephalexin Anaphylaxis and Swelling 02/25/2009  . Doxazosin mesylate Other (See Comments) 04/24/2013  . Levofloxacin Anaphylaxis and Rash 02/25/2009  . Codeine Other (See Comments) 02/25/2009  . Indomethacin Other (See Comments) 02/25/2009  . Ivp dye [iodinated diagnostic agents] Hives 04/24/2013  . Morphine Other (See Comments) 02/25/2009  . Sulfamethoxazole-trimethoprim Other (See Comments)   . Penicillins Other (See Comments) 02/25/2009    Family History  Problem Relation Age of Onset    . Stroke Father   . Colon cancer Neg Hx     Social History   Socioeconomic History  . Marital status: Widowed    Spouse name: Not on file  . Number of children: 4  . Years of education: college  . Highest education level: Not on file  Occupational History    Comment: Retired  Scientific laboratory technician  . Financial resource strain: Not on file  . Food insecurity:    Worry: Not on file    Inability: Not on file  . Transportation needs:    Medical: Not on file    Non-medical: Not on file  Tobacco Use  . Smoking status: Never Smoker  . Smokeless tobacco: Never Used  . Tobacco comment: tobacco use - no  Substance and Sexual Activity  . Alcohol use: No    Alcohol/week: 0.0 standard drinks  . Drug use: No  .  Sexual activity: Not Currently  Lifestyle  . Physical activity:    Days per week: Not on file    Minutes per session: Not on file  . Stress: Not on file  Relationships  . Social connections:    Talks on phone: Not on file    Gets together: Not on file    Attends religious service: Not on file    Active member of club or organization: Not on file    Attends meetings of clubs or organizations: Not on file    Relationship status: Not on file  . Intimate partner violence:    Fear of current or ex partner: Not on file    Emotionally abused: Not on file    Physically abused: Not on file    Forced sexual activity: Not on file  Other Topics Concern  . Not on file  Social History Narrative   Retired, widowed, education college education .   Right handed.   Caffeine one cup of coffee daily.     Review of Systems: See HPI, otherwise negative ROS  Physical Exam: BP 132/81   Pulse 68   Temp (!) 96.5 F (35.8 C) (Oral)   Ht 5\' 8"  (1.727 m)   Wt 179 lb (81.2 kg)   BMI 27.22 kg/m  General:   Frail, somewhat hard of hearing.  Pleasant and cooperative in NAD SNeck:  Supple; no masses or thyromegaly. No significant cervical adenopathy. Lungs:  Clear throughout to auscultation.    No wheezes, crackles, or rhonchi. No acute distress. Heart:  Regular rate and rhythm; no murmurs, clicks, rubs,  or gallops. Abdomen: Non-distended, normal bowel sounds.  Soft and nontender without appreciable mass or hepatosplenomegaly.  Pulses:  Normal pulses noted. Extremities:  Without clubbing or edema. Rectal: No external lesions.  Good sphincter tone.  Firm pieces of stool in the rectal vault no mass.  Stool Hemoccult negative.    Impression/Plan: Pleasant elderly lady with GERD well-controlled on Dexilant -but needs to switch due to third-party payer issues. Constipation somewhat suboptimally managed.  Need to improve bowel frequency with more regular use of MiraLAX.  History of colonic adenoma.  As discussed, I feel the benefits would not outweigh the risks of offering her another colonoscopy in the future unless new symptoms develop.  Recommendations: Stop Dexilant; begin Protonix 40 mg daily for reflux.  Dispense 30 with 11 refills I recommend you take MiraLAX 17 g orally every day.  You may take it in the morning or the evening.  I would like you to take it every day whether you think you needed or not.  Only reason not to take it is if you get diarrhea  Office visit with me in 4 months.          Notice: This dictation was prepared with Dragon dictation along with smaller phrase technology. Any transcriptional errors that result from this process are unintentional and may not be corrected upon review.

## 2018-11-07 DIAGNOSIS — B9629 Other Escherichia coli [E. coli] as the cause of diseases classified elsewhere: Secondary | ICD-10-CM | POA: Diagnosis not present

## 2018-11-07 DIAGNOSIS — Z6827 Body mass index (BMI) 27.0-27.9, adult: Secondary | ICD-10-CM | POA: Diagnosis not present

## 2018-11-07 DIAGNOSIS — N390 Urinary tract infection, site not specified: Secondary | ICD-10-CM | POA: Diagnosis not present

## 2018-11-07 DIAGNOSIS — M545 Low back pain: Secondary | ICD-10-CM | POA: Diagnosis not present

## 2018-11-07 DIAGNOSIS — M533 Sacrococcygeal disorders, not elsewhere classified: Secondary | ICD-10-CM | POA: Diagnosis not present

## 2018-11-09 DIAGNOSIS — R269 Unspecified abnormalities of gait and mobility: Secondary | ICD-10-CM | POA: Diagnosis not present

## 2018-11-12 DIAGNOSIS — R269 Unspecified abnormalities of gait and mobility: Secondary | ICD-10-CM | POA: Diagnosis not present

## 2018-11-16 DIAGNOSIS — R269 Unspecified abnormalities of gait and mobility: Secondary | ICD-10-CM | POA: Diagnosis not present

## 2018-11-19 DIAGNOSIS — R269 Unspecified abnormalities of gait and mobility: Secondary | ICD-10-CM | POA: Diagnosis not present

## 2018-11-19 NOTE — Telephone Encounter (Signed)
I spoke to pt and she started PT 10/15/2018 M and Fridays.  She had PT 11/11 Monday and from there had appt with urology for UTI and had injection.  She noted excrutiating pain in tailbone, and groin area.  Went to pcp on Wednesday and they did xray which was ok per pcp.  She continues with pain,  and is wondering if PT should be stopped.  PT did alter there activities which has helped.  Her question still is should she stop PT and then see orthoped.  She states she was getting PT for balance and gait (and does note increase strength).  Please advise about continuing PT.  Thanks.

## 2018-11-19 NOTE — Telephone Encounter (Signed)
If she is in pain and cant complete PT then she maybe should stop for a week and see if her symptoms improve.

## 2018-11-19 NOTE — Telephone Encounter (Signed)
Patient has been taking PT at [facility] in [place] but she feels worse than before and has not helped with balance. Please call and discuss.

## 2018-11-20 DIAGNOSIS — N952 Postmenopausal atrophic vaginitis: Secondary | ICD-10-CM | POA: Diagnosis not present

## 2018-11-20 DIAGNOSIS — B9629 Other Escherichia coli [E. coli] as the cause of diseases classified elsewhere: Secondary | ICD-10-CM | POA: Diagnosis not present

## 2018-11-20 DIAGNOSIS — N390 Urinary tract infection, site not specified: Secondary | ICD-10-CM | POA: Diagnosis not present

## 2018-11-20 DIAGNOSIS — N3946 Mixed incontinence: Secondary | ICD-10-CM | POA: Diagnosis not present

## 2018-11-20 DIAGNOSIS — N3281 Overactive bladder: Secondary | ICD-10-CM | POA: Diagnosis not present

## 2018-11-20 DIAGNOSIS — R350 Frequency of micturition: Secondary | ICD-10-CM | POA: Diagnosis not present

## 2018-11-20 NOTE — Telephone Encounter (Signed)
I spoke to pt and relayed what MM/NP stated to see how she does w/o PT for a week and see if sx improve.  Pt will do this, had bad night.  She will see her pcp today.  I told her to call ortho and make appt for end of 7 days and can always cancel if better.   She stated will do.

## 2018-11-30 ENCOUNTER — Ambulatory Visit: Payer: Medicare Other | Admitting: Gastroenterology

## 2018-11-30 DIAGNOSIS — R300 Dysuria: Secondary | ICD-10-CM | POA: Diagnosis not present

## 2018-12-03 ENCOUNTER — Ambulatory Visit (INDEPENDENT_AMBULATORY_CARE_PROVIDER_SITE_OTHER): Payer: Medicare Other | Admitting: Otolaryngology

## 2018-12-03 DIAGNOSIS — J310 Chronic rhinitis: Secondary | ICD-10-CM

## 2018-12-03 DIAGNOSIS — R269 Unspecified abnormalities of gait and mobility: Secondary | ICD-10-CM | POA: Diagnosis not present

## 2018-12-03 DIAGNOSIS — K219 Gastro-esophageal reflux disease without esophagitis: Secondary | ICD-10-CM

## 2018-12-03 DIAGNOSIS — R490 Dysphonia: Secondary | ICD-10-CM

## 2018-12-06 DIAGNOSIS — R269 Unspecified abnormalities of gait and mobility: Secondary | ICD-10-CM | POA: Diagnosis not present

## 2018-12-17 ENCOUNTER — Telehealth: Payer: Self-pay

## 2018-12-17 DIAGNOSIS — K219 Gastro-esophageal reflux disease without esophagitis: Secondary | ICD-10-CM | POA: Diagnosis not present

## 2018-12-17 DIAGNOSIS — R269 Unspecified abnormalities of gait and mobility: Secondary | ICD-10-CM | POA: Diagnosis not present

## 2018-12-17 DIAGNOSIS — I951 Orthostatic hypotension: Secondary | ICD-10-CM | POA: Diagnosis not present

## 2018-12-17 DIAGNOSIS — E039 Hypothyroidism, unspecified: Secondary | ICD-10-CM | POA: Diagnosis not present

## 2018-12-17 NOTE — Telephone Encounter (Signed)
Received paperwork from [facility]. Paperwork has been signed and faxed back to [phone]. Confirmation fax has been received.

## 2018-12-25 DIAGNOSIS — Z85828 Personal history of other malignant neoplasm of skin: Secondary | ICD-10-CM | POA: Diagnosis not present

## 2018-12-25 DIAGNOSIS — D692 Other nonthrombocytopenic purpura: Secondary | ICD-10-CM | POA: Diagnosis not present

## 2018-12-27 NOTE — Progress Notes (Signed)
Cardiology Office Note  Date: 12/28/2018   ID: [name], DOB 1930/04/24, MRN [id number]  PCP: Manon Hilding, MD  Primary Cardiologist: Rozann Lesches, MD   Chief Complaint  Patient presents with  . Cardiac follow-up    History of Present Illness: [name] is an 85 y.o. female last seen in January 2019.  She presents for a routine visit.  From a cardiac perspective she has had no major change over the last year based on record review.  She still has intermittent leg swelling and uses Lasix for this as well as compression stockings.  She does not report exertional chest pain.  Still lives in her own home and is functional with ADLs.  She maintains follow-up with Dr. Quintin Alto.  Follow-up echocardiogram in February of last year showed LVEF 60 to 65% range with moderate tricuspid regurgitation and moderately elevated pulmonary artery systolic pressure of 50 mmHg.  I personally reviewed her ECG today which shows sinus bradycardia.  Past Medical History:  Diagnosis Date  . Arthritis   . Chronic diastolic heart failure ([name])   . Diverticulosis of colon (without mention of hemorrhage)    TCS by Dr. Gala Romney  . GERD (gastroesophageal reflux disease)   . Hemorrhoids, internal    TCS by Dr. Gala Romney  . Irritable bowel syndrome   . Migraine   . Mitral regurgitation   . Peripheral polyneuropathy    Confirmed by EMG and nerve conduction velocities   . Pulmonary nodule   . Seizures ([place]) 2011  . Syncope    Neurocardiogenic - positive tilt table test, on Florinef  . Tubular adenoma   . UTI (urinary tract infection)    Recurrent    Past Surgical History:  Procedure Laterality Date  . APPENDECTOMY    . Back Pain     going to chiropracator for back and hip pain  . BACK SURGERY     x2  . bladder tack    . CATARACT EXTRACTION, BILATERAL  2017  . CHOLECYSTECTOMY    . COLONOSCOPY  06/16/2005   internal hemorrhoids, L side diverticula - Dr. Gala Romney  . COLONOSCOPY N/A  05/02/2013   Dr. Gala Romney- normal rectum, scattered L sided diverticula. bx= tubular adenoma  . ESOPHAGOGASTRODUODENOSCOPY  12/21/2006     Dr. Vivi Ferns esophagus/Patulous EG junction, small to moderate sized hiatal hernia,   multiple fundal gland type appearing polyps biopsied, otherwise normal  . FOOT SURGERY     cyst removed  . TOTAL ABDOMINAL HYSTERECTOMY    . TOTAL HIP ARTHROPLASTY Right 01/11/2017   Procedure: RIGHT TOTAL HIP ARTHROPLASTY ANTERIOR APPROACH;  Surgeon: Gaynelle Arabian, MD;  Location: [facility];  Service: Orthopedics;  Laterality: Right;  . WRIST SURGERY      Current Outpatient Medications  Medication Sig Dispense Refill  . aspirin EC 81 MG tablet Take 81 mg by mouth daily.    Marland Kitchen Bioflavonoid Products (BIOFLEX PO) Bioflex    . Cholecalciferol (VITAMIN D) 2000 units CAPS Take by mouth daily.    Marland Kitchen DEXILANT 60 MG capsule TAKE 1 CAPSULE DAILY 90 capsule 4  . ESTRACE VAGINAL 0.1 MG/GM vaginal cream     . fexofenadine (ALLEGRA) 180 MG tablet Take 180 mg by mouth daily.    . fludrocortisone (FLORINEF) 0.1 MG tablet TAKE 1 TABLET THREE TIMES A WEEK 36 tablet 3  . fluticasone (FLONASE) 50 MCG/ACT nasal spray Place 2 sprays into the nose as needed for allergies.     . furosemide (LASIX)  20 MG tablet Take 1 tablet (20 mg total) by mouth daily. Pt states she does not take it on Sundays and when she has a doctor's appt. (Patient taking differently: Take 20 mg by mouth as directed. Takes 3-4 times per week) 90 tablet 3  . furosemide (LASIX) 20 MG tablet Take 20 mg by mouth. Takes one 3-4 times per week    . Lactobacillus (PROBIOTIC ACIDOPHILUS PO) Take by mouth daily.    Marland Kitchen levothyroxine (SYNTHROID, LEVOTHROID) 75 MCG tablet Take 75 mcg by mouth daily before breakfast.    . methenamine (HIPREX) 1 g tablet Take 1 g by mouth 2 (two) times daily with a meal.    . mirabegron ER (MYRBETRIQ) 50 MG TB24 tablet Take 50 mg by mouth daily.    . montelukast (SINGULAIR) 10 MG tablet Take 10 mg by mouth  at bedtime.     . Multiple Vitamin (MULTIVITAMIN) tablet Take 1 tablet by mouth daily.    . nitroGLYCERIN (NITROSTAT) 0.4 MG SL tablet Place 1 tablet (0.4 mg total) under the tongue every 5 (five) minutes as needed for chest pain. 25 tablet 3  . Omega-3 Fatty Acids (FISH OIL) 1200 MG CAPS Take by mouth daily.    . pantoprazole (PROTONIX) 40 MG tablet Take 1 tablet (40 mg total) by mouth daily. 30 tablet 11  . Polyethyl Glycol-Propyl Glycol (SYSTANE OP) Apply 1 drop to eye daily.     . polyethylene glycol (MIRALAX / GLYCOLAX) packet Take 17 g by mouth 3 (three) times daily as needed.     . polyvinyl alcohol (LIQUIFILM TEARS) 1.4 % ophthalmic solution Place 1 drop into both eyes as needed for dry eyes. 15 mL 0  . Probiotic Product (PROBIOTIC PO) Take by mouth daily.    Marland Kitchen topiramate (TOPAMAX) 50 MG tablet TAKE 2 TABLETS IN THE MORNING AND 3 TABLETS AT NIGHT 450 tablet 4  . triamcinolone cream (KENALOG) 0.1 % triamcinolone acetonide 0.1 % topical cream     No current facility-administered medications for this visit.    Allergies:  Cephalexin; Doxazosin mesylate; Levofloxacin; Codeine; Indomethacin; Ivp dye [iodinated diagnostic agents]; Morphine; Sulfamethoxazole-trimethoprim; and Penicillins   Social History: The patient  reports that she has never smoked. She has never used smokeless tobacco. She reports that she does not drink alcohol or use drugs.   ROS:  Please see the history of present illness. Otherwise, complete review of systems is positive for intermittent musculoskeletal aches and pains.  All other systems are reviewed and negative.   Physical Exam: VS:  BP 120/70   Pulse (!) 58   Ht 5\' 8"  (1.727 m)   Wt 179 lb (81.2 kg)   SpO2 99%   BMI 27.22 kg/m , BMI Body mass index is 27.22 kg/m.  Wt Readings from Last 3 Encounters:  12/28/18 179 lb (81.2 kg)  11/06/18 179 lb (81.2 kg)  10/02/18 180 lb (81.6 kg)    General: Elderly woman, appears comfortable at rest. HEENT:  Conjunctiva and lids normal, oropharynx clear. Neck: Supple, no elevated JVP or carotid bruits, no thyromegaly. Lungs: Clear to auscultation, nonlabored breathing at rest. Cardiac: Regular rate and rhythm, no S3, soft systolic murmur. Abdomen: Soft, nontender, bowel sounds present. Extremities: Mild lower leg edema, distal pulses 1-2+. Skin: Warm and dry. Musculoskeletal: No kyphosis. Neuropsychiatric: Alert and oriented x3, affect grossly appropriate.  ECG: I personally reviewed the tracing from 10/10/2017 which showed sinus rhythm with PACs.  Recent Labwork: 08/16/2018: ALT 12; AST 21; BUN 12;  Creat 0.82; Hemoglobin 12.8; Platelets 136; Potassium 4.0; Sodium 143; TSH 1.73   Other Studies Reviewed Today:  Echocardiogram 01/24/2018: Study Conclusions  - Left ventricle: The cavity size was normal. Wall thickness was   normal. Systolic function was normal. The estimated ejection   fraction was in the range of 60% to 65%. Wall motion was normal;   there were no regional wall motion abnormalities. - Aortic valve: Valve area (VTI): 3.17 cm^2. Valve area (Vmax):   2.76 cm^2. Valve area (Vmean): 2.76 cm^2. - Mitral valve: There was mild regurgitation. - Right atrium: The atrium was mildly dilated. - Atrial septum: No defect or patent foramen ovale was identified. - Tricuspid valve: There was moderate regurgitation. - Pulmonary arteries: Systolic pressure was moderately increased.   PA peak pressure: 50 mm Hg (S).  Assessment and Plan:  1.  Diastolic dysfunction with moderate pulmonary hypertension intermittent leg swelling.  She uses compression hose and also Lasix, echocardiogram was relatively stable by assessment last year.  No significant change in weight.  Continue with current plan.  2.  Neurocardiogenic syncope by history, no recurring events.  3.  Mild mitral and moderate tricuspid regurgitation, stable by follow-up echocardiogram last year.  Current medicines were reviewed  with the patient today.   Orders Placed This Encounter  Procedures  . EKG 12-Lead    Disposition: Follow-up in 1 year.  Signed, Satira Sark, MD, [facility] 12/28/2018 11:26 AM    [facility] [place], [place], [place] [address] Phone: [phone]; Fax: [phone]

## 2018-12-28 ENCOUNTER — Encounter: Payer: Self-pay | Admitting: Cardiology

## 2018-12-28 ENCOUNTER — Ambulatory Visit (INDEPENDENT_AMBULATORY_CARE_PROVIDER_SITE_OTHER): Payer: Medicare Other | Admitting: Cardiology

## 2018-12-28 VITALS — BP 120/70 | HR 58 | Ht 68.0 in | Wt 179.0 lb

## 2018-12-28 DIAGNOSIS — I38 Endocarditis, valve unspecified: Secondary | ICD-10-CM

## 2018-12-28 DIAGNOSIS — R55 Syncope and collapse: Secondary | ICD-10-CM

## 2018-12-28 DIAGNOSIS — I5032 Chronic diastolic (congestive) heart failure: Secondary | ICD-10-CM

## 2018-12-28 MED ORDER — NITROGLYCERIN 0.4 MG SL SUBL: 0.4000 mg | SUBLINGUAL_TABLET | SUBLINGUAL | 0 refills | Status: DC | PRN

## 2018-12-28 NOTE — Patient Instructions (Addendum)

## 2019-01-08 DIAGNOSIS — M79672 Pain in left foot: Secondary | ICD-10-CM | POA: Diagnosis not present

## 2019-01-08 DIAGNOSIS — M79671 Pain in right foot: Secondary | ICD-10-CM | POA: Diagnosis not present

## 2019-01-08 DIAGNOSIS — M2042 Other hammer toe(s) (acquired), left foot: Secondary | ICD-10-CM | POA: Diagnosis not present

## 2019-01-08 DIAGNOSIS — I739 Peripheral vascular disease, unspecified: Secondary | ICD-10-CM | POA: Diagnosis not present

## 2019-01-08 DIAGNOSIS — L110 Acquired keratosis follicularis: Secondary | ICD-10-CM | POA: Diagnosis not present

## 2019-01-08 DIAGNOSIS — M2041 Other hammer toe(s) (acquired), right foot: Secondary | ICD-10-CM | POA: Diagnosis not present

## 2019-01-08 DIAGNOSIS — M25579 Pain in unspecified ankle and joints of unspecified foot: Secondary | ICD-10-CM | POA: Diagnosis not present

## 2019-01-10 DIAGNOSIS — G5762 Lesion of plantar nerve, left lower limb: Secondary | ICD-10-CM | POA: Diagnosis not present

## 2019-01-10 DIAGNOSIS — M79672 Pain in left foot: Secondary | ICD-10-CM | POA: Diagnosis not present

## 2019-01-14 DIAGNOSIS — R51 Headache: Secondary | ICD-10-CM | POA: Diagnosis not present

## 2019-01-14 DIAGNOSIS — N183 Chronic kidney disease, stage 3 (moderate): Secondary | ICD-10-CM | POA: Diagnosis not present

## 2019-01-14 DIAGNOSIS — M67431 Ganglion, right wrist: Secondary | ICD-10-CM | POA: Diagnosis not present

## 2019-01-14 DIAGNOSIS — R600 Localized edema: Secondary | ICD-10-CM | POA: Diagnosis not present

## 2019-01-14 DIAGNOSIS — Z6827 Body mass index (BMI) 27.0-27.9, adult: Secondary | ICD-10-CM | POA: Diagnosis not present

## 2019-01-14 DIAGNOSIS — E039 Hypothyroidism, unspecified: Secondary | ICD-10-CM | POA: Diagnosis not present

## 2019-01-14 DIAGNOSIS — Z23 Encounter for immunization: Secondary | ICD-10-CM | POA: Diagnosis not present

## 2019-01-14 DIAGNOSIS — I951 Orthostatic hypotension: Secondary | ICD-10-CM | POA: Diagnosis not present

## 2019-01-17 DIAGNOSIS — G40909 Epilepsy, unspecified, not intractable, without status epilepticus: Secondary | ICD-10-CM | POA: Diagnosis not present

## 2019-01-17 DIAGNOSIS — E039 Hypothyroidism, unspecified: Secondary | ICD-10-CM | POA: Diagnosis not present

## 2019-01-30 DIAGNOSIS — R922 Inconclusive mammogram: Secondary | ICD-10-CM | POA: Diagnosis not present

## 2019-01-30 DIAGNOSIS — G8929 Other chronic pain: Secondary | ICD-10-CM | POA: Diagnosis not present

## 2019-01-30 DIAGNOSIS — N644 Mastodynia: Secondary | ICD-10-CM | POA: Diagnosis not present

## 2019-01-30 DIAGNOSIS — M76822 Posterior tibial tendinitis, left leg: Secondary | ICD-10-CM | POA: Diagnosis not present

## 2019-01-30 DIAGNOSIS — G6289 Other specified polyneuropathies: Secondary | ICD-10-CM | POA: Diagnosis not present

## 2019-01-30 DIAGNOSIS — M25572 Pain in left ankle and joints of left foot: Secondary | ICD-10-CM | POA: Diagnosis not present

## 2019-01-30 DIAGNOSIS — R222 Localized swelling, mass and lump, trunk: Secondary | ICD-10-CM | POA: Diagnosis not present

## 2019-02-07 DIAGNOSIS — M25572 Pain in left ankle and joints of left foot: Secondary | ICD-10-CM | POA: Diagnosis not present

## 2019-02-07 DIAGNOSIS — M76822 Posterior tibial tendinitis, left leg: Secondary | ICD-10-CM | POA: Diagnosis not present

## 2019-02-07 DIAGNOSIS — R262 Difficulty in walking, not elsewhere classified: Secondary | ICD-10-CM | POA: Diagnosis not present

## 2019-02-12 DIAGNOSIS — M25572 Pain in left ankle and joints of left foot: Secondary | ICD-10-CM | POA: Diagnosis not present

## 2019-02-12 DIAGNOSIS — R262 Difficulty in walking, not elsewhere classified: Secondary | ICD-10-CM | POA: Diagnosis not present

## 2019-02-12 DIAGNOSIS — M76822 Posterior tibial tendinitis, left leg: Secondary | ICD-10-CM | POA: Diagnosis not present

## 2019-02-14 DIAGNOSIS — M25572 Pain in left ankle and joints of left foot: Secondary | ICD-10-CM | POA: Diagnosis not present

## 2019-02-14 DIAGNOSIS — R262 Difficulty in walking, not elsewhere classified: Secondary | ICD-10-CM | POA: Diagnosis not present

## 2019-02-14 DIAGNOSIS — M76822 Posterior tibial tendinitis, left leg: Secondary | ICD-10-CM | POA: Diagnosis not present

## 2019-02-18 ENCOUNTER — Ambulatory Visit (INDEPENDENT_AMBULATORY_CARE_PROVIDER_SITE_OTHER): Payer: Medicare Other | Admitting: Otolaryngology

## 2019-02-18 DIAGNOSIS — R51 Headache: Secondary | ICD-10-CM

## 2019-02-19 DIAGNOSIS — N952 Postmenopausal atrophic vaginitis: Secondary | ICD-10-CM | POA: Diagnosis not present

## 2019-02-19 DIAGNOSIS — R350 Frequency of micturition: Secondary | ICD-10-CM | POA: Diagnosis not present

## 2019-02-19 DIAGNOSIS — Z87440 Personal history of urinary (tract) infections: Secondary | ICD-10-CM | POA: Diagnosis not present

## 2019-02-19 DIAGNOSIS — N3946 Mixed incontinence: Secondary | ICD-10-CM | POA: Diagnosis not present

## 2019-02-19 DIAGNOSIS — N3281 Overactive bladder: Secondary | ICD-10-CM | POA: Diagnosis not present

## 2019-02-22 DIAGNOSIS — R262 Difficulty in walking, not elsewhere classified: Secondary | ICD-10-CM | POA: Diagnosis not present

## 2019-02-22 DIAGNOSIS — M76822 Posterior tibial tendinitis, left leg: Secondary | ICD-10-CM | POA: Diagnosis not present

## 2019-02-22 DIAGNOSIS — M25572 Pain in left ankle and joints of left foot: Secondary | ICD-10-CM | POA: Diagnosis not present

## 2019-02-26 DIAGNOSIS — R262 Difficulty in walking, not elsewhere classified: Secondary | ICD-10-CM | POA: Diagnosis not present

## 2019-02-26 DIAGNOSIS — M76822 Posterior tibial tendinitis, left leg: Secondary | ICD-10-CM | POA: Diagnosis not present

## 2019-02-26 DIAGNOSIS — M25572 Pain in left ankle and joints of left foot: Secondary | ICD-10-CM | POA: Diagnosis not present

## 2019-02-28 DIAGNOSIS — Z9181 History of falling: Secondary | ICD-10-CM | POA: Diagnosis not present

## 2019-02-28 DIAGNOSIS — M76829 Posterior tibial tendinitis, unspecified leg: Secondary | ICD-10-CM | POA: Diagnosis not present

## 2019-03-06 DIAGNOSIS — S29019A Strain of muscle and tendon of unspecified wall of thorax, initial encounter: Secondary | ICD-10-CM | POA: Diagnosis not present

## 2019-03-06 DIAGNOSIS — Z6826 Body mass index (BMI) 26.0-26.9, adult: Secondary | ICD-10-CM | POA: Diagnosis not present

## 2019-03-08 ENCOUNTER — Ambulatory Visit: Payer: Medicare Other | Admitting: Internal Medicine

## 2019-03-28 ENCOUNTER — Telehealth: Payer: Self-pay

## 2019-03-28 NOTE — Telephone Encounter (Signed)
Spoke with pt. Received a letter from [employer] stating that they don't cover Dexilant RX. Pt said she isn't taking Dexilant anymore. She takes Pantoprazole and Famotidine. Dr. Benjamine Mola changed pts medication. Pt doesn't feel that what he prescribed her is working well. She gets occasional burning symptoms. Pt was asked to call Dr. Benjamine Mola and ask him if her medication should be changed since the medication prescribed by him isn't helping her.  Pt will also call back if needed.

## 2019-04-08 DIAGNOSIS — G40909 Epilepsy, unspecified, not intractable, without status epilepticus: Secondary | ICD-10-CM | POA: Diagnosis not present

## 2019-04-08 DIAGNOSIS — R7301 Impaired fasting glucose: Secondary | ICD-10-CM | POA: Diagnosis not present

## 2019-04-08 DIAGNOSIS — N183 Chronic kidney disease, stage 3 (moderate): Secondary | ICD-10-CM | POA: Diagnosis not present

## 2019-04-08 DIAGNOSIS — K210 Gastro-esophageal reflux disease with esophagitis: Secondary | ICD-10-CM | POA: Diagnosis not present

## 2019-04-08 DIAGNOSIS — E039 Hypothyroidism, unspecified: Secondary | ICD-10-CM | POA: Diagnosis not present

## 2019-04-08 DIAGNOSIS — R42 Dizziness and giddiness: Secondary | ICD-10-CM | POA: Diagnosis not present

## 2019-04-11 DIAGNOSIS — R5383 Other fatigue: Secondary | ICD-10-CM | POA: Diagnosis not present

## 2019-04-11 DIAGNOSIS — D696 Thrombocytopenia, unspecified: Secondary | ICD-10-CM | POA: Diagnosis not present

## 2019-04-11 DIAGNOSIS — I951 Orthostatic hypotension: Secondary | ICD-10-CM | POA: Diagnosis not present

## 2019-04-11 DIAGNOSIS — N183 Chronic kidney disease, stage 3 (moderate): Secondary | ICD-10-CM | POA: Diagnosis not present

## 2019-04-11 DIAGNOSIS — R7301 Impaired fasting glucose: Secondary | ICD-10-CM | POA: Diagnosis not present

## 2019-04-11 DIAGNOSIS — E039 Hypothyroidism, unspecified: Secondary | ICD-10-CM | POA: Diagnosis not present

## 2019-04-11 DIAGNOSIS — G40909 Epilepsy, unspecified, not intractable, without status epilepticus: Secondary | ICD-10-CM | POA: Diagnosis not present

## 2019-04-11 DIAGNOSIS — I872 Venous insufficiency (chronic) (peripheral): Secondary | ICD-10-CM | POA: Diagnosis not present

## 2019-04-12 ENCOUNTER — Other Ambulatory Visit: Payer: Self-pay

## 2019-04-12 ENCOUNTER — Encounter: Payer: Self-pay | Admitting: Internal Medicine

## 2019-04-12 ENCOUNTER — Ambulatory Visit (INDEPENDENT_AMBULATORY_CARE_PROVIDER_SITE_OTHER): Payer: Medicare Other | Admitting: Internal Medicine

## 2019-04-12 ENCOUNTER — Encounter

## 2019-04-12 DIAGNOSIS — K5909 Other constipation: Secondary | ICD-10-CM | POA: Diagnosis not present

## 2019-04-12 DIAGNOSIS — K219 Gastro-esophageal reflux disease without esophagitis: Secondary | ICD-10-CM | POA: Diagnosis not present

## 2019-04-12 NOTE — Progress Notes (Signed)
.   Referring Provider:  Primary Care Physician:  Manon Hilding, MD  Primary GI:   Virtual Visit via Telephone Note Due to COVID-19, visit is conducted virtually and was requested by patient.   I connected with [name] on 04/12/19 at  8:30 AM EDT by telephone and verified that I am speaking with the correct person using two identifiers.   I discussed the limitations, risks, security and privacy concerns of performing an evaluation and management service by telephone and the availability of in person appointments. I also discussed with the patient that there may be a patient responsible charge related to this service. The patient expressed understanding and agreed to proceed.  Chief Complaint  Patient presents with  . Gastroesophageal Reflux    takes Protonix in am, Dr. Benjamine Mola added Pepcid at night; "burning in gut"  . Constipation    Miralax helps when she takes it; takes Dulcolax prn     History of Present Illness: 85 year old lady with GERD.  Forced to switch to Protonix because of third-party payer constraints.  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  No dysphagia.  Takes MiraLAX once a day and not always daily.  May go 3 to 4 days without a bowel movement.  No bleeding.  Famotidine nightly added by ENT.  Previously did take Dexilant in the evening.  [place] recently injured her left leg but fortunately no fracture.  She remains ambulatory and lives at home alone multiple children nearby to watch her closely.   Past Medical History:  Diagnosis Date  . Arthritis   . Chronic diastolic heart failure ([place])   . Diverticulosis of colon (without mention of hemorrhage)    TCS by Dr. Gala Romney  . GERD (gastroesophageal reflux disease)   . Hemorrhoids, internal    TCS by Dr. Gala Romney  . Irritable bowel syndrome   . Migraine   . Mitral regurgitation   . Peripheral polyneuropathy    Confirmed by EMG and nerve conduction velocities   . Pulmonary nodule   . Seizures ([place])  2011  . Syncope    Neurocardiogenic - positive tilt table test, on Florinef  . Tubular adenoma   . UTI (urinary tract infection)    Recurrent     Past Surgical History:  Procedure Laterality Date  . APPENDECTOMY    . Back Pain     going to chiropracator for back and hip pain  . BACK SURGERY     x2  . bladder tack    . CATARACT EXTRACTION, BILATERAL  2017  . CHOLECYSTECTOMY    . COLONOSCOPY  06/16/2005   internal hemorrhoids, L side diverticula - Dr. Gala Romney  . COLONOSCOPY N/A 05/02/2013   Dr. Gala Romney- normal rectum, scattered L sided diverticula. bx= tubular adenoma  . ESOPHAGOGASTRODUODENOSCOPY  12/21/2006     Dr. Vivi Ferns esophagus/Patulous EG junction, small to moderate sized hiatal hernia,   multiple fundal gland type appearing polyps biopsied, otherwise normal  . FOOT SURGERY     cyst removed  . TOTAL ABDOMINAL HYSTERECTOMY    . TOTAL HIP ARTHROPLASTY Right 01/11/2017   Procedure: RIGHT TOTAL HIP ARTHROPLASTY ANTERIOR APPROACH;  Surgeon: Gaynelle Arabian, MD;  Location: [facility];  Service: Orthopedics;  Laterality: Right;  . WRIST SURGERY       Current Meds  Medication Sig  . Ascorbic Acid (VITAMIN C PO) Take by mouth daily.  Marland Kitchen aspirin EC 81 MG tablet Take 81 mg by mouth daily.  Marland Kitchen  Bioflavonoid Products (BIOFLEX PO) Bioflex  . Cholecalciferol (VITAMIN D) 2000 units CAPS Take by mouth daily.  Marland Kitchen ESTRACE VAGINAL 0.1 MG/GM vaginal cream   . famotidine (PEPCID) 20 MG tablet Take 1 tablet by mouth at bedtime.  . fexofenadine (ALLEGRA) 180 MG tablet Take 180 mg by mouth daily.  . fludrocortisone (FLORINEF) 0.1 MG tablet TAKE 1 TABLET THREE TIMES A WEEK  . fluticasone (FLONASE) 50 MCG/ACT nasal spray Place 2 sprays into the nose as needed for allergies.   . furosemide (LASIX) 20 MG tablet Take 1 tablet (20 mg total) by mouth daily. Pt states she does not take it on Sundays and when she has a doctor's appt. (Patient taking differently: Take 20 mg by mouth as directed. Takes 3-4 times  per week)  . furosemide (LASIX) 20 MG tablet Take 20 mg by mouth. Takes one 4-5 times per week  . ipratropium (ATROVENT) 0.06 % nasal spray Place 1 spray into the nose as needed.  . Lactobacillus (PROBIOTIC ACIDOPHILUS PO) Take by mouth daily.  Marland Kitchen levothyroxine (SYNTHROID, LEVOTHROID) 75 MCG tablet Take 75 mcg by mouth daily before breakfast.  . methenamine (HIPREX) 1 g tablet Take 1 g by mouth 2 (two) times daily with a meal.  . mirabegron ER (MYRBETRIQ) 50 MG TB24 tablet Take 50 mg by mouth daily.  . montelukast (SINGULAIR) 10 MG tablet Take 10 mg by mouth at bedtime.   . Multiple Vitamin (MULTIVITAMIN) tablet Take 1 tablet by mouth daily.  . nitroGLYCERIN (NITROSTAT) 0.4 MG SL tablet Place 1 tablet (0.4 mg total) under the tongue every 5 (five) minutes x 3 doses as needed for chest pain (if no relief after 3rd dose, proceed to the ED for an evaluation).  . Omega-3 Fatty Acids (FISH OIL) 1200 MG CAPS Take by mouth daily.  . pantoprazole (PROTONIX) 40 MG tablet Take 1 tablet (40 mg total) by mouth daily.  Vladimir Faster Glycol-Propyl Glycol (SYSTANE OP) Apply 1 drop to eye daily.   . polyethylene glycol (MIRALAX / GLYCOLAX) packet Take 17 g by mouth daily as needed.   . polyvinyl alcohol (LIQUIFILM TEARS) 1.4 % ophthalmic solution Place 1 drop into both eyes as needed for dry eyes.  . Probiotic Product (PROBIOTIC PO) Take by mouth daily.  Marland Kitchen topiramate (TOPAMAX) 50 MG tablet TAKE 2 TABLETS IN THE MORNING AND 3 TABLETS AT NIGHT  . triamcinolone cream (KENALOG) 0.1 % triamcinolone acetonide 0.1 % topical cream      Review of Systems: Observations/Objective: No distress. Unable to perform physical exam due to telephone encounter. No video available.   Assessment and Plan:   85 year old lady with chronic constipation and GERD.  Acid suppression regimen modified by ENT.  I agree with that approach.  Dexilant did better with controlling acid reflux particularly nocturnally.  She was taking Dexilant in  the evening.  Constipation suboptimally managed.  If she needs to liberalize use of MiraLAX.  No alarm symptoms.   Follow Up Instructions:  Take your Protonix 40 mg daily just before supper instead of breakfast.  Continue taking famotidine at bedtime as prescribed by Dr. Melene Plan.  You need to take a dose of MiraLAX once a day every day and add a second dose either in the morning or the evening to have at least 1 bowel movement every other day or 3 times weekly.  Call me in 2 weeks if the changes above are not helpful.  Face-to-face office appointment in 4 months  We will send this  instruction sheet to your email today at your request ([email or web address]).  See attached patient instructions.    I discussed the assessment and treatment plan with the patient. The patient was provided an opportunity to ask questions and all were answered. The patient agreed with the plan and demonstrated an understanding of the instructions.   The patient was advised to call back or seek an in-person evaluation if the symptoms worsen or if the condition fails to improve as anticipated.  I provided 15 minutes of non-face-to-face time during this encounter.   R Garfield Cornea, MD [facility]

## 2019-04-12 NOTE — Patient Instructions (Signed)
Take your Protonix 40 mg daily just before supper instead of breakfast.  Continue taking famotidine at bedtime as prescribed by Dr. Melene Plan.  You need to take a dose of MiraLAX once a day every day and add a second dose either in the morning or the evening to have at least 1 bowel movement every other day or 3 times weekly.  Call me in 2 weeks if the changes above are not helpful.  Face-to-face office appointment in 4 months  We will send this instruction sheet to your email today at your request ([email or web address]).

## 2019-04-17 ENCOUNTER — Telehealth: Payer: Self-pay | Admitting: Internal Medicine

## 2019-04-17 ENCOUNTER — Other Ambulatory Visit: Payer: Self-pay

## 2019-04-17 MED ORDER — RABEPRAZOLE SODIUM 20 MG PO TBEC: 20.0000 mg | DELAYED_RELEASE_TABLET | Freq: Every day | ORAL | 3 refills | Status: DC

## 2019-04-17 NOTE — Telephone Encounter (Signed)
PATIENT CALLED AND SAID THAT THE MEDS SHE WAS PUT ON IS NOT CONTROLLING HER REFLUX.

## 2019-04-17 NOTE — Telephone Encounter (Signed)
Lmom, waiting on a return call.  

## 2019-04-17 NOTE — Telephone Encounter (Signed)
Continue taking Pepcid at bedtime.  Stop pantoprazole; try AcipHex 20 mg daily-take before evening meal.  Dispense 30 with 3 refills.

## 2019-04-17 NOTE — Telephone Encounter (Signed)
Pt notified. New RX sent into [facility].

## 2019-04-17 NOTE — Telephone Encounter (Signed)
Spoke with pt. Pt was seen for constitution and gerd 04/12/19. She switched to taking Pantoprazole 40 mg with her dinner instead of with breakfast.  Pt is also taking Pepcid before bed. Pt is having some burning in the chest that she feels isn't being controlled with the Pantoprazole and Pepcid.

## 2019-04-18 DIAGNOSIS — E039 Hypothyroidism, unspecified: Secondary | ICD-10-CM | POA: Diagnosis not present

## 2019-04-30 DIAGNOSIS — M79672 Pain in left foot: Secondary | ICD-10-CM | POA: Diagnosis not present

## 2019-04-30 DIAGNOSIS — G5762 Lesion of plantar nerve, left lower limb: Secondary | ICD-10-CM | POA: Diagnosis not present

## 2019-04-30 DIAGNOSIS — L280 Lichen simplex chronicus: Secondary | ICD-10-CM | POA: Diagnosis not present

## 2019-05-02 ENCOUNTER — Other Ambulatory Visit: Payer: Self-pay | Admitting: Cardiology

## 2019-05-07 ENCOUNTER — Ambulatory Visit: Payer: Medicare Other | Admitting: Internal Medicine

## 2019-05-07 DIAGNOSIS — M25579 Pain in unspecified ankle and joints of unspecified foot: Secondary | ICD-10-CM | POA: Diagnosis not present

## 2019-05-07 DIAGNOSIS — M79672 Pain in left foot: Secondary | ICD-10-CM | POA: Diagnosis not present

## 2019-05-07 DIAGNOSIS — M79671 Pain in right foot: Secondary | ICD-10-CM | POA: Diagnosis not present

## 2019-05-16 ENCOUNTER — Telehealth: Payer: Self-pay | Admitting: *Deleted

## 2019-05-16 NOTE — Telephone Encounter (Signed)
Due to current COVID 19 pandemic, our office is severely reducing in office visits until further notice, in order to minimize the risk to our patients and healthcare providers.  Pt understands that although there may be some limitations with this type of visit, we will take all precautions to reduce any security or privacy concerns.  Pt understands that this will be treated like an in office visit and we will file with pt's insurance, and there may be a patient responsible charge related to this service. Consented to [email or web address] visit if kids able to assist.  Email sent [email or web address].

## 2019-05-21 ENCOUNTER — Other Ambulatory Visit: Payer: Self-pay

## 2019-05-21 ENCOUNTER — Ambulatory Visit (INDEPENDENT_AMBULATORY_CARE_PROVIDER_SITE_OTHER): Payer: Medicare Other | Admitting: Adult Health

## 2019-05-21 DIAGNOSIS — R51 Headache: Secondary | ICD-10-CM

## 2019-05-21 DIAGNOSIS — R569 Unspecified convulsions: Secondary | ICD-10-CM

## 2019-05-21 DIAGNOSIS — R269 Unspecified abnormalities of gait and mobility: Secondary | ICD-10-CM

## 2019-05-21 DIAGNOSIS — R519 Headache, unspecified: Secondary | ICD-10-CM

## 2019-05-21 DIAGNOSIS — G629 Polyneuropathy, unspecified: Secondary | ICD-10-CM

## 2019-05-21 NOTE — Progress Notes (Signed)
I agree with the above plan 

## 2019-05-21 NOTE — Progress Notes (Signed)
PATIENT: [name] DOB: 06/02/1930  REASON FOR VISIT: follow up HISTORY FROM: patient  Virtual Visit via Video Note  I connected with [name] on 05/21/19 at  2:30 PM EDT by a video enabled telemedicine application located remotely at [facility] and verified that I am speaking with the correct person using two identifiers who was located at their own home.   I discussed the limitations of evaluation and management by telemedicine and the availability of in person appointments. The patient expressed understanding and agreed to proceed.   PATIENT: [name] DOB: 1930-09-25  REASON FOR VISIT: follow up HISTORY FROM: patient  HISTORY OF PRESENT ILLNESS: Today 05/21/19:  [name] is an 85 year old female with a history of migraine headaches, seizures, peripheral neuropathy and abnormality of gait.  She joins me today for virtual visit.  Overall she says her headaches are under relatively good control.  She continues to have trouble with neuropathy.  Describes burning and tingling sensations in both feet.  She reports that she had a fall in late February.  Fortunately she did not suffer any injuries.  She does use a cane or walker but her daughter states that she typically carries a cane instead of actually using it.  She saw her orthopedist in March and is now wearing a left ankle brace and did receive a steroid injection.  She remains on Topamax 100 mg twice a day.  She denies any seizure events.  She joins me today for virtual visit.  HISTORY 10/02/18 [name] is an 85 year old female with a history of migraine headaches, seizures, peripheral neuropathy. She returns today for follow-up.  She continues on Topamax 100 mg twice a day.  She states that she continues to have burning and tingling in the legs but this is manageable.  She states that her balance has been off.  She has not had any falls but has had some near misses.  She does use a  cane when ambulating.  She continues to have frequent headaches but has been on several medications in the past.  She does wake up with headaches.  She denies snoring.  Denies any significant daytime sleepiness.  She denies any seizure events.  She returns today for evaluation.   REVIEW OF SYSTEMS: Out of a complete 14 system review of symptoms, the patient complains only of the following symptoms, and all other reviewed systems are negative.  See HPI  ALLERGIES: Allergies  Allergen Reactions  . Cephalexin Anaphylaxis and Swelling  . Doxazosin Mesylate Other (See Comments)    Made patient EXTREMELY sick.   . Levofloxacin Anaphylaxis and Rash  . Codeine Other (See Comments)    Hallucinations  . Indomethacin Other (See Comments)    Severe Headache   . Ivp Dye [Iodinated Diagnostic Agents] Hives  . Morphine Other (See Comments)    Hallucinations   . Sulfamethoxazole-Trimethoprim Other (See Comments)    Unknown   . Penicillins Other (See Comments)    Made patient go into shock Has patient had a PCN reaction causing immediate rash, facial/tongue/throat swelling, SOB or lightheadedness with hypotension: Yes Has patient had a PCN reaction causing severe rash involving mucus membranes or skin necrosis: No Has patient had a PCN reaction that required hospitalization No Has patient had a PCN reaction occurring within the last 10 years: No If all of the above answers are "NO", then may proceed with Cephalosporin use.     HOME MEDICATIONS:  Outpatient Medications Prior to Visit  Medication Sig Dispense Refill  . Ascorbic Acid (VITAMIN C PO) Take by mouth daily.    Marland Kitchen aspirin EC 81 MG tablet Take 81 mg by mouth daily.    Marland Kitchen Bioflavonoid Products (BIOFLEX PO) Bioflex    . Cholecalciferol (VITAMIN D) 2000 units CAPS Take by mouth daily.    Marland Kitchen DEXILANT 60 MG capsule TAKE 1 CAPSULE DAILY (Patient not taking: Reported on 04/12/2019) 90 capsule 4  . ESTRACE VAGINAL 0.1 MG/GM vaginal cream     .  famotidine (PEPCID) 20 MG tablet Take 1 tablet by mouth at bedtime.    . fexofenadine (ALLEGRA) 180 MG tablet Take 180 mg by mouth daily.    . fludrocortisone (FLORINEF) 0.1 MG tablet TAKE 1 TABLET THREE TIMES A WEEK 36 tablet 3  . fluticasone (FLONASE) 50 MCG/ACT nasal spray Place 2 sprays into the nose as needed for allergies.     . furosemide (LASIX) 20 MG tablet Take 1 tablet (20 mg total) by mouth daily. Pt states she does not take it on Sundays and when she has a doctor's appt. (Patient taking differently: Take 20 mg by mouth as directed. Takes 3-4 times per week) 90 tablet 3  . furosemide (LASIX) 20 MG tablet Take 20 mg by mouth. Takes one 4-5 times per week    . ipratropium (ATROVENT) 0.06 % nasal spray Place 1 spray into the nose as needed.    . Lactobacillus (PROBIOTIC ACIDOPHILUS PO) Take by mouth daily.    Marland Kitchen levothyroxine (SYNTHROID, LEVOTHROID) 75 MCG tablet Take 75 mcg by mouth daily before breakfast.    . methenamine (HIPREX) 1 g tablet Take 1 g by mouth 2 (two) times daily with a meal.    . mirabegron ER (MYRBETRIQ) 50 MG TB24 tablet Take 50 mg by mouth daily.    . montelukast (SINGULAIR) 10 MG tablet Take 10 mg by mouth at bedtime.     . Multiple Vitamin (MULTIVITAMIN) tablet Take 1 tablet by mouth daily.    . nitroGLYCERIN (NITROSTAT) 0.4 MG SL tablet Place 1 tablet (0.4 mg total) under the tongue every 5 (five) minutes x 3 doses as needed for chest pain (if no relief after 3rd dose, proceed to the ED for an evaluation). 75 tablet 0  . Omega-3 Fatty Acids (FISH OIL) 1200 MG CAPS Take by mouth daily.    . pantoprazole (PROTONIX) 40 MG tablet Take 1 tablet (40 mg total) by mouth daily. 30 tablet 11  . Polyethyl Glycol-Propyl Glycol (SYSTANE OP) Apply 1 drop to eye daily.     . polyethylene glycol (MIRALAX / GLYCOLAX) packet Take 17 g by mouth daily as needed.     . polyvinyl alcohol (LIQUIFILM TEARS) 1.4 % ophthalmic solution Place 1 drop into both eyes as needed for dry eyes. 15 mL  0  . Probiotic Product (PROBIOTIC PO) Take by mouth daily.    . RABEprazole (ACIPHEX) 20 MG tablet Take 1 tablet (20 mg total) by mouth daily. Take nightly 30 tablet 3  . topiramate (TOPAMAX) 50 MG tablet TAKE 2 TABLETS IN THE MORNING AND 3 TABLETS AT NIGHT 450 tablet 4  . triamcinolone cream (KENALOG) 0.1 % triamcinolone acetonide 0.1 % topical cream     No facility-administered medications prior to visit.     PAST MEDICAL HISTORY: Past Medical History:  Diagnosis Date  . Arthritis   . Chronic diastolic heart failure ([place])   . Diverticulosis of colon (without mention of hemorrhage)  TCS by Dr. Gala Romney  . GERD (gastroesophageal reflux disease)   . Hemorrhoids, internal    TCS by Dr. Gala Romney  . Irritable bowel syndrome   . Migraine   . Mitral regurgitation   . Peripheral polyneuropathy    Confirmed by EMG and nerve conduction velocities   . Pulmonary nodule   . Seizures ([place]) 2011  . Syncope    Neurocardiogenic - positive tilt table test, on Florinef  . Tubular adenoma   . UTI (urinary tract infection)    Recurrent    PAST SURGICAL HISTORY: Past Surgical History:  Procedure Laterality Date  . APPENDECTOMY    . Back Pain     going to chiropracator for back and hip pain  . BACK SURGERY     x2  . bladder tack    . CATARACT EXTRACTION, BILATERAL  2017  . CHOLECYSTECTOMY    . COLONOSCOPY  06/16/2005   internal hemorrhoids, L side diverticula - Dr. Gala Romney  . COLONOSCOPY N/A 05/02/2013   Dr. Gala Romney- normal rectum, scattered L sided diverticula. bx= tubular adenoma  . ESOPHAGOGASTRODUODENOSCOPY  12/21/2006     Dr. Vivi Ferns esophagus/Patulous EG junction, small to moderate sized hiatal hernia,   multiple fundal gland type appearing polyps biopsied, otherwise normal  . FOOT SURGERY     cyst removed  . TOTAL ABDOMINAL HYSTERECTOMY    . TOTAL HIP ARTHROPLASTY Right 01/11/2017   Procedure: RIGHT TOTAL HIP ARTHROPLASTY ANTERIOR APPROACH;  Surgeon: Gaynelle Arabian, MD;  Location: [facility]  [facility];  Service: Orthopedics;  Laterality: Right;  . WRIST SURGERY      FAMILY HISTORY: Family History  Problem Relation Age of Onset  . Stroke Father   . Colon cancer Neg Hx     SOCIAL HISTORY: Social History   Socioeconomic History  . Marital status: Widowed    Spouse name: Not on file  . Number of children: 4  . Years of education: college  . Highest education level: Not on file  Occupational History    Comment: Retired  Scientific laboratory technician  . Financial resource strain: Not on file  . Food insecurity:    Worry: Not on file    Inability: Not on file  . Transportation needs:    Medical: Not on file    Non-medical: Not on file  Tobacco Use  . Smoking status: Never Smoker  . Smokeless tobacco: Never Used  . Tobacco comment: tobacco use - no  Substance and Sexual Activity  . Alcohol use: No    Alcohol/week: 0.0 standard drinks  . Drug use: No  . Sexual activity: Not Currently  Lifestyle  . Physical activity:    Days per week: Not on file    Minutes per session: Not on file  . Stress: Not on file  Relationships  . Social connections:    Talks on phone: Not on file    Gets together: Not on file    Attends religious service: Not on file    Active member of club or organization: Not on file    Attends meetings of clubs or organizations: Not on file    Relationship status: Not on file  . Intimate partner violence:    Fear of current or ex partner: Not on file    Emotionally abused: Not on file    Physically abused: Not on file    Forced sexual activity: Not on file  Other Topics Concern  . Not on file  Social History Narrative   Retired, widowed, education  college education .   Right handed.   Caffeine one cup of coffee daily.       PHYSICAL EXAM Generalized: Well developed, in no acute distress   Neurological examination  Mentation: Alert oriented to time, place, history taking. Follows all commands speech and language fluent Cranial nerve II-XII:Extraocular  movements were full. Facial symmetry noted. uvula tongue midline. Head turning and shoulder shrug  were normal and symmetric. Motor: Good strength throughout subjectively per patient Sensory: Sensory testing is intact to soft touch on all 4 extremities subjectively per patient Coordination: Cerebellar testing reveals good finger-nose-finger  Gait and station: Patient is able to stand from a seated position.  Has a cane but carries it when ambulating instead of actually using it..  Reflexes: UTA  DIAGNOSTIC DATA (LABS, IMAGING, TESTING) - I reviewed patient records, labs, notes, testing and imaging myself where available.  Lab Results  Component Value Date   WBC 4.4 08/16/2018   HGB 12.8 08/16/2018   HCT 38.7 08/16/2018   MCV 82.0 08/16/2018   PLT 136 (L) 08/16/2018      Component Value Date/Time   NA 143 08/16/2018 1153   K 4.0 08/16/2018 1153   CL 109 08/16/2018 1153   CO2 27 08/16/2018 1153   GLUCOSE 86 08/16/2018 1153   BUN 12 08/16/2018 1153   CREATININE 0.82 08/16/2018 1153   CALCIUM 9.4 08/16/2018 1153   PROT 6.3 08/16/2018 1153   ALBUMIN 3.9 01/06/2017 1518   AST 21 08/16/2018 1153   ALT 12 08/16/2018 1153   ALKPHOS 57 01/06/2017 1518   BILITOT 0.6 08/16/2018 1153   GFRNONAA 65 08/16/2018 1153   GFRAA 75 08/16/2018 1153    Lab Results  Component Value Date   TSH 1.73 08/16/2018      ASSESSMENT AND PLAN 85 y.o. year old female  has a past medical history of Arthritis, Chronic diastolic heart failure ([place]), Diverticulosis of colon (without mention of hemorrhage), GERD (gastroesophageal reflux disease), Hemorrhoids, internal, Irritable bowel syndrome, Migraine, Mitral regurgitation, Peripheral polyneuropathy, Pulmonary nodule, Seizures ([place]) (2011), Syncope, Tubular adenoma, and UTI (urinary tract infection). here with:  1.  Seizures 2.  Headaches 3.  Peripheral neuropathy 4.  Abnormality of gait  Overall the patient has remained relatively stable.  She will  continue on Topamax 100 mg twice a day.  I will send an order in for compounded cream for his neuropathy.  She is encouraged to use her cane or walker when ambulating.  Advised that if her symptoms worsen or she develops new symptoms she should let us know.  She will follow-up in 6 to 8 months or sooner if needed.  I spent 25 minutes with the patient this time was spent reviewing the chart prior to the visit, discussing symptoms and medication.   Ward Givens, MSN, NP-C 05/21/2019, 11:44 AM [facility] [address], [place], [place] [address] [phone]

## 2019-05-28 DIAGNOSIS — M79671 Pain in right foot: Secondary | ICD-10-CM | POA: Diagnosis not present

## 2019-05-28 DIAGNOSIS — M25579 Pain in unspecified ankle and joints of unspecified foot: Secondary | ICD-10-CM | POA: Diagnosis not present

## 2019-05-28 DIAGNOSIS — M79672 Pain in left foot: Secondary | ICD-10-CM | POA: Diagnosis not present

## 2019-06-10 DIAGNOSIS — M76822 Posterior tibial tendinitis, left leg: Secondary | ICD-10-CM | POA: Diagnosis not present

## 2019-06-10 DIAGNOSIS — M25572 Pain in left ankle and joints of left foot: Secondary | ICD-10-CM | POA: Diagnosis not present

## 2019-06-10 DIAGNOSIS — M19011 Primary osteoarthritis, right shoulder: Secondary | ICD-10-CM | POA: Diagnosis not present

## 2019-06-10 DIAGNOSIS — G8929 Other chronic pain: Secondary | ICD-10-CM | POA: Diagnosis not present

## 2019-06-17 DIAGNOSIS — M76822 Posterior tibial tendinitis, left leg: Secondary | ICD-10-CM | POA: Diagnosis not present

## 2019-06-17 DIAGNOSIS — M25572 Pain in left ankle and joints of left foot: Secondary | ICD-10-CM | POA: Diagnosis not present

## 2019-06-17 DIAGNOSIS — M19031 Primary osteoarthritis, right wrist: Secondary | ICD-10-CM | POA: Diagnosis not present

## 2019-06-17 DIAGNOSIS — M19011 Primary osteoarthritis, right shoulder: Secondary | ICD-10-CM | POA: Diagnosis not present

## 2019-06-18 DIAGNOSIS — E785 Hyperlipidemia, unspecified: Secondary | ICD-10-CM | POA: Diagnosis not present

## 2019-06-18 DIAGNOSIS — E039 Hypothyroidism, unspecified: Secondary | ICD-10-CM | POA: Diagnosis not present

## 2019-06-18 DIAGNOSIS — I10 Essential (primary) hypertension: Secondary | ICD-10-CM | POA: Diagnosis not present

## 2019-06-19 DIAGNOSIS — M76812 Anterior tibial syndrome, left leg: Secondary | ICD-10-CM | POA: Diagnosis not present

## 2019-06-19 DIAGNOSIS — M25572 Pain in left ankle and joints of left foot: Secondary | ICD-10-CM | POA: Diagnosis not present

## 2019-06-24 DIAGNOSIS — M76812 Anterior tibial syndrome, left leg: Secondary | ICD-10-CM | POA: Diagnosis not present

## 2019-06-24 DIAGNOSIS — M25572 Pain in left ankle and joints of left foot: Secondary | ICD-10-CM | POA: Diagnosis not present

## 2019-06-26 DIAGNOSIS — M76812 Anterior tibial syndrome, left leg: Secondary | ICD-10-CM | POA: Diagnosis not present

## 2019-06-26 DIAGNOSIS — M25572 Pain in left ankle and joints of left foot: Secondary | ICD-10-CM | POA: Diagnosis not present

## 2019-06-27 ENCOUNTER — Telehealth: Payer: Self-pay

## 2019-06-27 DIAGNOSIS — M79672 Pain in left foot: Secondary | ICD-10-CM | POA: Diagnosis not present

## 2019-06-27 DIAGNOSIS — N3946 Mixed incontinence: Secondary | ICD-10-CM | POA: Diagnosis not present

## 2019-06-27 DIAGNOSIS — Z87440 Personal history of urinary (tract) infections: Secondary | ICD-10-CM | POA: Diagnosis not present

## 2019-06-27 DIAGNOSIS — N952 Postmenopausal atrophic vaginitis: Secondary | ICD-10-CM | POA: Diagnosis not present

## 2019-06-27 DIAGNOSIS — M25579 Pain in unspecified ankle and joints of unspecified foot: Secondary | ICD-10-CM | POA: Diagnosis not present

## 2019-06-27 DIAGNOSIS — R350 Frequency of micturition: Secondary | ICD-10-CM | POA: Diagnosis not present

## 2019-06-27 DIAGNOSIS — M79671 Pain in right foot: Secondary | ICD-10-CM | POA: Diagnosis not present

## 2019-06-27 NOTE — Telephone Encounter (Addendum)
Received a Therapy review from [facility] in reference to the pt requesting Dexilant.  According to epic Dr. Gala Romney last prescribed Aciphex 20 mg daily.  I have called pt and left Vm for a return call to discuss her meds.  [employer] no longer covers the [place] and it has moved to a non-covered drug status and will no longer be available through [employer] treatment facility pharmacies or the [employer] mail order.  If the products below are not appropriate, the patient will be required to obtain the medication from a retail pharmacy and pay 100% of its cost.   Preferred alternatives are:  1. Omeprazole ( 10,20,40 )  2. Pantoprazole Dr  ( 20 and 40)  The below medications require a coverage review if selected:   1. Rabeprazole  20 mg  2. Esomeprazole  ( 20 and 40)

## 2019-07-01 DIAGNOSIS — M76812 Anterior tibial syndrome, left leg: Secondary | ICD-10-CM | POA: Diagnosis not present

## 2019-07-01 DIAGNOSIS — M25572 Pain in left ankle and joints of left foot: Secondary | ICD-10-CM | POA: Diagnosis not present

## 2019-07-02 NOTE — Telephone Encounter (Signed)
Called pt, line busy

## 2019-07-02 NOTE — Telephone Encounter (Signed)
Home number busy again, Left Vm for a return call on mobile number. Also, mailing a letter to call.

## 2019-07-03 DIAGNOSIS — M76812 Anterior tibial syndrome, left leg: Secondary | ICD-10-CM | POA: Diagnosis not present

## 2019-07-03 DIAGNOSIS — M25572 Pain in left ankle and joints of left foot: Secondary | ICD-10-CM | POA: Diagnosis not present

## 2019-07-05 DIAGNOSIS — B349 Viral infection, unspecified: Secondary | ICD-10-CM | POA: Diagnosis not present

## 2019-07-08 DIAGNOSIS — Z20828 Contact with and (suspected) exposure to other viral communicable diseases: Secondary | ICD-10-CM | POA: Diagnosis not present

## 2019-07-08 DIAGNOSIS — R05 Cough: Secondary | ICD-10-CM | POA: Diagnosis not present

## 2019-07-08 DIAGNOSIS — J209 Acute bronchitis, unspecified: Secondary | ICD-10-CM | POA: Diagnosis not present

## 2019-07-11 NOTE — Telephone Encounter (Addendum)
Pt said she does the best on Dexilant, but the Aciphex is working a little better taking before her evening meal. She is aware the Dexilant is not going to be available through [employer] or [employer] order any longer and she would have to get the medication from a retail pharmacy and pay 100% of its cost.   She will just stay on the Aciphex for now.   Sending FYI to Dr. Gala Romney.

## 2019-07-15 DIAGNOSIS — M76822 Posterior tibial tendinitis, left leg: Secondary | ICD-10-CM | POA: Diagnosis not present

## 2019-07-16 DIAGNOSIS — L110 Acquired keratosis follicularis: Secondary | ICD-10-CM | POA: Diagnosis not present

## 2019-07-16 DIAGNOSIS — M2041 Other hammer toe(s) (acquired), right foot: Secondary | ICD-10-CM | POA: Diagnosis not present

## 2019-07-16 DIAGNOSIS — M76812 Anterior tibial syndrome, left leg: Secondary | ICD-10-CM | POA: Diagnosis not present

## 2019-07-16 DIAGNOSIS — M79671 Pain in right foot: Secondary | ICD-10-CM | POA: Diagnosis not present

## 2019-07-16 DIAGNOSIS — M25572 Pain in left ankle and joints of left foot: Secondary | ICD-10-CM | POA: Diagnosis not present

## 2019-07-16 DIAGNOSIS — I739 Peripheral vascular disease, unspecified: Secondary | ICD-10-CM | POA: Diagnosis not present

## 2019-07-16 DIAGNOSIS — M79672 Pain in left foot: Secondary | ICD-10-CM | POA: Diagnosis not present

## 2019-07-16 DIAGNOSIS — M25579 Pain in unspecified ankle and joints of unspecified foot: Secondary | ICD-10-CM | POA: Diagnosis not present

## 2019-07-16 DIAGNOSIS — M2042 Other hammer toe(s) (acquired), left foot: Secondary | ICD-10-CM | POA: Diagnosis not present

## 2019-07-17 NOTE — Telephone Encounter (Signed)
Would stick with AcipHex, taking it as she is doing, so long as it is doing the job.

## 2019-07-18 DIAGNOSIS — M25572 Pain in left ankle and joints of left foot: Secondary | ICD-10-CM | POA: Diagnosis not present

## 2019-07-18 DIAGNOSIS — M76812 Anterior tibial syndrome, left leg: Secondary | ICD-10-CM | POA: Diagnosis not present

## 2019-07-19 DIAGNOSIS — N183 Chronic kidney disease, stage 3 (moderate): Secondary | ICD-10-CM | POA: Diagnosis not present

## 2019-07-19 DIAGNOSIS — E039 Hypothyroidism, unspecified: Secondary | ICD-10-CM | POA: Diagnosis not present

## 2019-07-19 DIAGNOSIS — I5030 Unspecified diastolic (congestive) heart failure: Secondary | ICD-10-CM | POA: Diagnosis not present

## 2019-07-19 NOTE — Telephone Encounter (Signed)
Pt is aware.  

## 2019-07-22 DIAGNOSIS — M76812 Anterior tibial syndrome, left leg: Secondary | ICD-10-CM | POA: Diagnosis not present

## 2019-07-22 DIAGNOSIS — M25572 Pain in left ankle and joints of left foot: Secondary | ICD-10-CM | POA: Diagnosis not present

## 2019-07-25 DIAGNOSIS — M76812 Anterior tibial syndrome, left leg: Secondary | ICD-10-CM | POA: Diagnosis not present

## 2019-07-25 DIAGNOSIS — I739 Peripheral vascular disease, unspecified: Secondary | ICD-10-CM | POA: Diagnosis not present

## 2019-07-25 DIAGNOSIS — M25579 Pain in unspecified ankle and joints of unspecified foot: Secondary | ICD-10-CM | POA: Diagnosis not present

## 2019-07-25 DIAGNOSIS — M2041 Other hammer toe(s) (acquired), right foot: Secondary | ICD-10-CM | POA: Diagnosis not present

## 2019-07-25 DIAGNOSIS — M79671 Pain in right foot: Secondary | ICD-10-CM | POA: Diagnosis not present

## 2019-07-25 DIAGNOSIS — M2042 Other hammer toe(s) (acquired), left foot: Secondary | ICD-10-CM | POA: Diagnosis not present

## 2019-07-25 DIAGNOSIS — M25572 Pain in left ankle and joints of left foot: Secondary | ICD-10-CM | POA: Diagnosis not present

## 2019-07-25 DIAGNOSIS — L110 Acquired keratosis follicularis: Secondary | ICD-10-CM | POA: Diagnosis not present

## 2019-07-25 DIAGNOSIS — M79672 Pain in left foot: Secondary | ICD-10-CM | POA: Diagnosis not present

## 2019-07-30 DIAGNOSIS — M25572 Pain in left ankle and joints of left foot: Secondary | ICD-10-CM | POA: Diagnosis not present

## 2019-07-30 DIAGNOSIS — M76812 Anterior tibial syndrome, left leg: Secondary | ICD-10-CM | POA: Diagnosis not present

## 2019-08-02 ENCOUNTER — Encounter

## 2019-08-02 ENCOUNTER — Ambulatory Visit (INDEPENDENT_AMBULATORY_CARE_PROVIDER_SITE_OTHER): Payer: Medicare Other | Admitting: Internal Medicine

## 2019-08-02 ENCOUNTER — Other Ambulatory Visit: Payer: Self-pay

## 2019-08-02 ENCOUNTER — Encounter: Payer: Self-pay | Admitting: Internal Medicine

## 2019-08-02 VITALS — BP 123/75 | HR 66 | Temp 96.9°F | Ht 68.0 in | Wt 178.6 lb

## 2019-08-02 DIAGNOSIS — K5900 Constipation, unspecified: Secondary | ICD-10-CM | POA: Diagnosis not present

## 2019-08-02 DIAGNOSIS — K219 Gastro-esophageal reflux disease without esophagitis: Secondary | ICD-10-CM | POA: Diagnosis not present

## 2019-08-02 DIAGNOSIS — M76812 Anterior tibial syndrome, left leg: Secondary | ICD-10-CM | POA: Diagnosis not present

## 2019-08-02 DIAGNOSIS — M25572 Pain in left ankle and joints of left foot: Secondary | ICD-10-CM | POA: Diagnosis not present

## 2019-08-02 NOTE — Progress Notes (Signed)
Primary Care Physician:  Manon Hilding, MD Primary Gastroenterologist:  Dr.   Pre-Procedure History & Physical: HPI:  [name] is a 85 y.o. female here for follow-up GERD and constipation.  Wishes she had Dexilant or Nexium to take chronically.  Takes rabeprazole in the morning  -  symptoms not nearly as well but she was compelled to change due to insurance coverage.  Takes Pepcid on occasion for breakthrough symptoms multiple times weekly.  No dysphagia.  Constipation going 3 to 4 days without a bowel movement when she takes anything takes MiraLAX and then is afraid to leave the house because she may have diarrhea.  No rectal bleeding. She is up-to-date on EGD and colonoscopy.  Has a known moderate size hiatal hernia. She is only taking rabeprazole and famotidine now for acid suppression.  Past Medical History:  Diagnosis Date  . Arthritis   . Chronic diastolic heart failure ([place])   . Diverticulosis of colon (without mention of hemorrhage)    TCS by Dr. Gala Romney  . GERD (gastroesophageal reflux disease)   . Hemorrhoids, internal    TCS by Dr. Gala Romney  . Irritable bowel syndrome   . Migraine   . Mitral regurgitation   . Peripheral polyneuropathy    Confirmed by EMG and nerve conduction velocities   . Pulmonary nodule   . Seizures ([place]) 2011  . Syncope    Neurocardiogenic - positive tilt table test, on Florinef  . Tubular adenoma   . UTI (urinary tract infection)    Recurrent    Past Surgical History:  Procedure Laterality Date  . APPENDECTOMY    . Back Pain     going to chiropracator for back and hip pain  . BACK SURGERY     x2  . bladder tack    . CATARACT EXTRACTION, BILATERAL  2017  . CHOLECYSTECTOMY    . COLONOSCOPY  06/16/2005   internal hemorrhoids, L side diverticula - Dr. Gala Romney  . COLONOSCOPY N/A 05/02/2013   Dr. Gala Romney- normal rectum, scattered L sided diverticula. bx= tubular adenoma  . ESOPHAGOGASTRODUODENOSCOPY  12/21/2006     Dr. Vivi Ferns  esophagus/Patulous EG junction, small to moderate sized hiatal hernia,   multiple fundal gland type appearing polyps biopsied, otherwise normal  . FOOT SURGERY     cyst removed  . TOTAL ABDOMINAL HYSTERECTOMY    . TOTAL HIP ARTHROPLASTY Right 01/11/2017   Procedure: RIGHT TOTAL HIP ARTHROPLASTY ANTERIOR APPROACH;  Surgeon: Gaynelle Arabian, MD;  Location: [facility];  Service: Orthopedics;  Laterality: Right;  . WRIST SURGERY      Prior to Admission medications   Medication Sig Start Date End Date Taking? Authorizing Provider  Ascorbic Acid (VITAMIN C PO) Take by mouth daily.   Yes [provider]  Bioflavonoid Products (BIOFLEX PO) Bioflex   Yes [provider]  CALCIUM PO Take by mouth daily.   Yes [provider]  Cholecalciferol (VITAMIN D) 2000 units CAPS Take by mouth daily.   Yes [provider]  Cyanocobalamin (VITAMIN B12 PO) Take by mouth daily.   Yes [provider]  diclofenac (VOLTAREN) 75 MG EC tablet Take 1 tablet by mouth 2 (two) times daily. 06/10/19 06/09/20 Yes [provider]  ESTRACE VAGINAL 0.1 MG/GM vaginal cream  05/16/17  Yes [provider]  famotidine (PEPCID) 20 MG tablet Take 1 tablet by mouth at bedtime. 01/14/19  Yes [provider]  fexofenadine (ALLEGRA) 180 MG tablet Take 180 mg by mouth  daily.   Yes [provider]  fludrocortisone (FLORINEF) 0.1 MG tablet TAKE 1 TABLET THREE TIMES A WEEK 05/02/19  Yes Satira Sark, MD  fluticasone [facility]) 50 MCG/ACT nasal spray Place 2 sprays into the nose as needed for allergies.    Yes [provider]  furosemide (LASIX) 20 MG tablet Take 1 tablet (20 mg total) by mouth daily. Pt states she does not take it on Sundays and when she has a doctor's appt. Patient taking differently: Take 20 mg by mouth as directed. Takes 3-4 times per week 07/02/18  Yes Satira Sark, MD  furosemide (LASIX) 20 MG tablet Take 20 mg by mouth. Takes one 4-5  times per week   Yes [provider]  ipratropium (ATROVENT) 0.06 % nasal spray Place 1 spray into the nose as needed. 12/06/18  Yes [provider]  Lactobacillus (PROBIOTIC ACIDOPHILUS PO) Take by mouth daily.   Yes [provider]  levothyroxine (SYNTHROID, LEVOTHROID) 75 MCG tablet Take 75 mcg by mouth daily before breakfast.   Yes [provider]  methenamine (HIPREX) 1 g tablet Take 1 g by mouth 2 (two) times daily with a meal.   Yes [provider]  mirabegron ER (MYRBETRIQ) 50 MG TB24 tablet Take 50 mg by mouth daily.   Yes [provider]  montelukast (SINGULAIR) 10 MG tablet Take 10 mg by mouth at bedtime.    Yes [provider]  Multiple Vitamin (MULTIVITAMIN) tablet Take 1 tablet by mouth daily.   Yes [provider]  nitroGLYCERIN (NITROSTAT) 0.4 MG SL tablet Place 1 tablet (0.4 mg total) under the tongue every 5 (five) minutes x 3 doses as needed for chest pain (if no relief after 3rd dose, proceed to the ED for an evaluation). 12/28/18  Yes Satira Sark, MD  nystatin (MYCOSTATIN) 100000 UNIT/ML suspension Take 5 mLs by mouth 4 (four) times daily. 07/12/19  Yes [provider]  Omega-3 Fatty Acids (FISH OIL) 1200 MG CAPS Take by mouth daily.   Yes [provider]  Polyethyl Glycol-Propyl Glycol (SYSTANE OP) Apply 1 drop to eye daily.    Yes [provider]  polyethylene glycol (MIRALAX / GLYCOLAX) packet Take 17 g by mouth daily as needed.    Yes [provider]  polyvinyl alcohol (LIQUIFILM TEARS) 1.4 % ophthalmic solution Place 1 drop into both eyes as needed for dry eyes. 01/13/17  Yes Perkins, Alexzandrew L, PA-C  Probiotic Product (PROBIOTIC PO) Take by mouth daily.   Yes [provider]  RABEprazole (ACIPHEX) 20 MG tablet Take 1 tablet (20 mg total) by mouth daily. Take nightly Patient taking differently: Take 20 mg by mouth daily. Takes before supper 04/17/19   Yes Joy Reiger, Cristopher Estimable, MD  topiramate (TOPAMAX) 50 MG tablet TAKE 2 TABLETS IN THE MORNING AND 3 TABLETS AT NIGHT 11/05/18  Yes Ward Givens, NP  triamcinolone cream (KENALOG) 0.1 % triamcinolone acetonide 0.1 % topical cream   Yes [provider]  aspirin EC 81 MG tablet Take 81 mg by mouth daily.    [provider]  DEXILANT 60 MG capsule TAKE 1 CAPSULE DAILY Patient not taking: Reported on 04/12/2019 09/17/18   Annitta Needs, NP  pantoprazole (PROTONIX) 40 MG tablet Take 1 tablet (40 mg total) by mouth daily. Patient not taking: Reported on 08/02/2019 11/06/18   Daneil Dolin, MD    Allergies as of 08/02/2019 - Review Complete 08/02/2019  Allergen Reaction Noted  .  Cephalexin Anaphylaxis and Swelling 02/25/2009  . Doxazosin mesylate Other (See Comments) 04/24/2013  . Levofloxacin Anaphylaxis and Rash 02/25/2009  . Codeine Other (See Comments) 02/25/2009  . Indomethacin Other (See Comments) 02/25/2009  . Ivp dye [iodinated diagnostic agents] Hives 04/24/2013  . Morphine Other (See Comments) 02/25/2009  . Sulfamethoxazole-trimethoprim Other (See Comments)   . Penicillins Other (See Comments) 02/25/2009    Family History  Problem Relation Age of Onset  . Stroke Father   . Colon cancer Neg Hx     Social History   Socioeconomic History  . Marital status: Widowed    Spouse name: Not on file  . Number of children: 4  . Years of education: college  . Highest education level: Not on file  Occupational History    Comment: Retired  Scientific laboratory technician  . Financial resource strain: Not on file  . Food insecurity    Worry: Not on file    Inability: Not on file  . Transportation needs    Medical: Not on file    Non-medical: Not on file  Tobacco Use  . Smoking status: Never Smoker  . Smokeless tobacco: Never Used  . Tobacco comment: tobacco use - no  Substance and Sexual Activity  . Alcohol use: No    Alcohol/week: 0.0 standard drinks  . Drug use: No  . Sexual  activity: Not Currently  Lifestyle  . Physical activity    Days per week: Not on file    Minutes per session: Not on file  . Stress: Not on file  Relationships  . Social Herbalist on phone: Not on file    Gets together: Not on file    Attends religious service: Not on file    Active member of club or organization: Not on file    Attends meetings of clubs or organizations: Not on file    Relationship status: Not on file  . Intimate partner violence    Fear of current or ex partner: Not on file    Emotionally abused: Not on file    Physically abused: Not on file    Forced sexual activity: Not on file  Other Topics Concern  . Not on file  Social History Narrative   Retired, widowed, education college education .   Right handed.   Caffeine one cup of coffee daily.     Review of Systems: See HPI, otherwise negative ROS  Physical Exam: BP 123/75   Pulse 66   Temp (!) 96.9 F (36.1 C) (Temporal)   Ht 5\' 8"  (1.727 m)   Wt 178 lb 9.6 oz (81 kg)   BMI 27.16 kg/m  General:   Alert, somewhat frail, pleasant and cooperative in NAD Neck:  Supple; no masses or thyromegaly. No significant cervical adenopathy. Lungs:  Clear throughout to auscultation.   No wheezes, crackles, or rhonchi. No acute distress. Heart:  Regular rate and rhythm; no murmurs, clicks, rubs,  or gallops. Abdomen: Non-distended, normal bowel sounds.  Soft and nontender without appreciable mass or hepatosplenomegaly.  Pulses:  Normal pulses noted. Extremities:  Without clubbing or edema.  Impression/Plan: Pleasant 85 year old lady with longstanding GERD in setting of a known hiatal hernia.  She derived better results with Nexium/Dexilant.  It is not financially feasible for her to continue taking one of these medications.  Rabeprazole not nearly as good but does help her reflux symptoms.  No alarm symptoms.  Chronic constipation; diarrhea often after sporadic use of MiraLAX.  She does  go 3 to 4 days  without a BM.  I do believe she needs to take something daily.  We need to modulate the dose of MiraLAX  Recommendations:Increase Rabeprazole to 20 mg twice daily (before breakfast and supper)  May use Famotidine as needed - if symptoms not relieved by Rabeprazole  Take 1/2 capful of Miralax every day  Call me in 3 weeks and let me know how you are doing  Office visit in 3 months   Notice: This dictation was prepared with Dragon dictation along with smaller phrase technology. Any transcriptional errors that result from this process are unintentional and may not be corrected upon review.

## 2019-08-02 NOTE — Patient Instructions (Addendum)
Increase Rabeprazole to 20 mg twice daily (before breakfast and supper)  May use Famotidine as needed - if symptoms not relieved by Rabeprazole  Take 1/2 capful of Miralax every day  Call me in 3 weeks and let me know how you are doing  Office visit in 3 months

## 2019-08-02 NOTE — Progress Notes (Signed)
g

## 2019-08-05 DIAGNOSIS — M2042 Other hammer toe(s) (acquired), left foot: Secondary | ICD-10-CM | POA: Diagnosis not present

## 2019-08-05 DIAGNOSIS — M76812 Anterior tibial syndrome, left leg: Secondary | ICD-10-CM | POA: Diagnosis not present

## 2019-08-05 DIAGNOSIS — L110 Acquired keratosis follicularis: Secondary | ICD-10-CM | POA: Diagnosis not present

## 2019-08-05 DIAGNOSIS — M79672 Pain in left foot: Secondary | ICD-10-CM | POA: Diagnosis not present

## 2019-08-05 DIAGNOSIS — M25572 Pain in left ankle and joints of left foot: Secondary | ICD-10-CM | POA: Diagnosis not present

## 2019-08-07 ENCOUNTER — Other Ambulatory Visit: Payer: Self-pay

## 2019-08-07 MED ORDER — RABEPRAZOLE SODIUM 20 MG PO TBEC: 20.0000 mg | DELAYED_RELEASE_TABLET | Freq: Two times a day (BID) | ORAL | 3 refills | Status: DC

## 2019-08-08 DIAGNOSIS — N183 Chronic kidney disease, stage 3 (moderate): Secondary | ICD-10-CM | POA: Diagnosis not present

## 2019-08-08 DIAGNOSIS — K210 Gastro-esophageal reflux disease with esophagitis: Secondary | ICD-10-CM | POA: Diagnosis not present

## 2019-08-08 DIAGNOSIS — G40909 Epilepsy, unspecified, not intractable, without status epilepticus: Secondary | ICD-10-CM | POA: Diagnosis not present

## 2019-08-08 DIAGNOSIS — R7301 Impaired fasting glucose: Secondary | ICD-10-CM | POA: Diagnosis not present

## 2019-08-08 DIAGNOSIS — E870 Hyperosmolality and hypernatremia: Secondary | ICD-10-CM | POA: Diagnosis not present

## 2019-08-08 DIAGNOSIS — E039 Hypothyroidism, unspecified: Secondary | ICD-10-CM | POA: Diagnosis not present

## 2019-08-08 DIAGNOSIS — R5383 Other fatigue: Secondary | ICD-10-CM | POA: Diagnosis not present

## 2019-08-08 DIAGNOSIS — R42 Dizziness and giddiness: Secondary | ICD-10-CM | POA: Diagnosis not present

## 2019-08-09 DIAGNOSIS — M25572 Pain in left ankle and joints of left foot: Secondary | ICD-10-CM | POA: Diagnosis not present

## 2019-08-09 DIAGNOSIS — M76812 Anterior tibial syndrome, left leg: Secondary | ICD-10-CM | POA: Diagnosis not present

## 2019-08-12 DIAGNOSIS — M76812 Anterior tibial syndrome, left leg: Secondary | ICD-10-CM | POA: Diagnosis not present

## 2019-08-12 DIAGNOSIS — M25572 Pain in left ankle and joints of left foot: Secondary | ICD-10-CM | POA: Diagnosis not present

## 2019-08-13 DIAGNOSIS — E039 Hypothyroidism, unspecified: Secondary | ICD-10-CM | POA: Diagnosis not present

## 2019-08-13 DIAGNOSIS — R5383 Other fatigue: Secondary | ICD-10-CM | POA: Diagnosis not present

## 2019-08-13 DIAGNOSIS — K210 Gastro-esophageal reflux disease with esophagitis: Secondary | ICD-10-CM | POA: Diagnosis not present

## 2019-08-13 DIAGNOSIS — G40909 Epilepsy, unspecified, not intractable, without status epilepticus: Secondary | ICD-10-CM | POA: Diagnosis not present

## 2019-08-13 DIAGNOSIS — M25551 Pain in right hip: Secondary | ICD-10-CM | POA: Diagnosis not present

## 2019-08-13 DIAGNOSIS — I951 Orthostatic hypotension: Secondary | ICD-10-CM | POA: Diagnosis not present

## 2019-08-13 DIAGNOSIS — Z6827 Body mass index (BMI) 27.0-27.9, adult: Secondary | ICD-10-CM | POA: Diagnosis not present

## 2019-08-13 DIAGNOSIS — I872 Venous insufficiency (chronic) (peripheral): Secondary | ICD-10-CM | POA: Diagnosis not present

## 2019-08-15 DIAGNOSIS — M25572 Pain in left ankle and joints of left foot: Secondary | ICD-10-CM | POA: Diagnosis not present

## 2019-08-15 DIAGNOSIS — M76812 Anterior tibial syndrome, left leg: Secondary | ICD-10-CM | POA: Diagnosis not present

## 2019-08-20 DIAGNOSIS — M25572 Pain in left ankle and joints of left foot: Secondary | ICD-10-CM | POA: Diagnosis not present

## 2019-08-20 DIAGNOSIS — M19031 Primary osteoarthritis, right wrist: Secondary | ICD-10-CM | POA: Diagnosis not present

## 2019-08-20 DIAGNOSIS — M76822 Posterior tibial tendinitis, left leg: Secondary | ICD-10-CM | POA: Diagnosis not present

## 2019-08-22 DIAGNOSIS — M25572 Pain in left ankle and joints of left foot: Secondary | ICD-10-CM | POA: Diagnosis not present

## 2019-08-22 DIAGNOSIS — M79671 Pain in right foot: Secondary | ICD-10-CM | POA: Diagnosis not present

## 2019-08-22 DIAGNOSIS — M79672 Pain in left foot: Secondary | ICD-10-CM | POA: Diagnosis not present

## 2019-08-22 DIAGNOSIS — M76822 Posterior tibial tendinitis, left leg: Secondary | ICD-10-CM | POA: Diagnosis not present

## 2019-08-22 DIAGNOSIS — M19031 Primary osteoarthritis, right wrist: Secondary | ICD-10-CM | POA: Diagnosis not present

## 2019-08-22 DIAGNOSIS — M25579 Pain in unspecified ankle and joints of unspecified foot: Secondary | ICD-10-CM | POA: Diagnosis not present

## 2019-08-28 DIAGNOSIS — M19031 Primary osteoarthritis, right wrist: Secondary | ICD-10-CM | POA: Diagnosis not present

## 2019-08-28 DIAGNOSIS — M76822 Posterior tibial tendinitis, left leg: Secondary | ICD-10-CM | POA: Diagnosis not present

## 2019-08-28 DIAGNOSIS — M25572 Pain in left ankle and joints of left foot: Secondary | ICD-10-CM | POA: Diagnosis not present

## 2019-08-29 ENCOUNTER — Telehealth: Payer: Self-pay

## 2019-08-29 NOTE — Telephone Encounter (Signed)
Pt called to give Dr. Gala Romney progress report on her medications.  The Rabeprazole 20 mg was increased to bid ( before breakfast and supper).  She is using the Famotidine as needed, which she said is rarely.  She is taking Miralax 1/2 capful most days and has not had constipation.   Said her symptoms are much improved.

## 2019-08-30 ENCOUNTER — Telehealth: Payer: Self-pay | Admitting: Cardiology

## 2019-08-30 DIAGNOSIS — R51 Headache: Secondary | ICD-10-CM | POA: Diagnosis not present

## 2019-08-30 DIAGNOSIS — Z6827 Body mass index (BMI) 27.0-27.9, adult: Secondary | ICD-10-CM | POA: Diagnosis not present

## 2019-08-30 DIAGNOSIS — I872 Venous insufficiency (chronic) (peripheral): Secondary | ICD-10-CM | POA: Diagnosis not present

## 2019-08-30 DIAGNOSIS — R05 Cough: Secondary | ICD-10-CM | POA: Diagnosis not present

## 2019-08-30 DIAGNOSIS — E039 Hypothyroidism, unspecified: Secondary | ICD-10-CM | POA: Diagnosis not present

## 2019-08-30 DIAGNOSIS — R350 Frequency of micturition: Secondary | ICD-10-CM | POA: Diagnosis not present

## 2019-08-30 DIAGNOSIS — R600 Localized edema: Secondary | ICD-10-CM | POA: Diagnosis not present

## 2019-08-30 DIAGNOSIS — I5030 Unspecified diastolic (congestive) heart failure: Secondary | ICD-10-CM | POA: Diagnosis not present

## 2019-08-30 NOTE — Telephone Encounter (Signed)
LM to return call.

## 2019-08-30 NOTE — Telephone Encounter (Signed)
Sounds good.  Would continue this regimen.  Follow-up as scheduled.

## 2019-08-30 NOTE — Telephone Encounter (Signed)
Patient called stating that her feet and legs continue to swell. States that she saw Dr. Quintin Alto this am. Was told by Dr. Quintin Alto to contact our office to see if Dr. Domenic Polite wants to order an Echo.    [phone])

## 2019-08-30 NOTE — Telephone Encounter (Signed)
Pt says for the last several weeks in the left leg and 2 weeks has been swelling in the right leg that hurts when she walks - has been in therapy for her ankle - had physical with Dr Quintin Alto who suggested that swelling may be heart related - pt denies any weight gain - c/o dizziness/SOB/fatgue - says HR runs in the 50s - doesn't know what BP has been running

## 2019-08-31 NOTE — Telephone Encounter (Signed)
She has a known history of recurrent leg swelling and is on Lasix 20 mg daily and also should be using compression hose. Would start by having her consider increasing Lasix to 40 mg daily temporarily to see if that helps.

## 2019-09-02 ENCOUNTER — Encounter: Payer: Self-pay | Admitting: Internal Medicine

## 2019-09-02 NOTE — Telephone Encounter (Signed)
Returned call to pt she states that she just had a phone call to make an appt to see Dr Domenic Polite on Wednesday. She will take an extra lasix today and tomorrow as directed by Dr Domenic Polite note. She will discuss further with him at appointment.

## 2019-09-02 NOTE — Telephone Encounter (Signed)
PATIENT SCHEDULED AND LETTER SENT  °

## 2019-09-02 NOTE — Telephone Encounter (Signed)
LMOM to continue regimen and follow up as planned. Forwarding to [place] for next appointment. Pt was to follow up in 3 months from August visit. Please nic.

## 2019-09-04 ENCOUNTER — Other Ambulatory Visit: Payer: Self-pay

## 2019-09-04 ENCOUNTER — Ambulatory Visit (INDEPENDENT_AMBULATORY_CARE_PROVIDER_SITE_OTHER): Payer: Medicare Other | Admitting: Cardiology

## 2019-09-04 ENCOUNTER — Encounter: Payer: Self-pay | Admitting: Cardiology

## 2019-09-04 VITALS — BP 106/62 | HR 61 | Temp 97.3°F | Ht 68.0 in | Wt 177.0 lb

## 2019-09-04 DIAGNOSIS — R600 Localized edema: Secondary | ICD-10-CM

## 2019-09-04 DIAGNOSIS — I5032 Chronic diastolic (congestive) heart failure: Secondary | ICD-10-CM

## 2019-09-04 MED ORDER — FUROSEMIDE 20 MG PO TABS: 20.0000 mg | ORAL_TABLET | Freq: Every day | ORAL | 3 refills | Status: DC

## 2019-09-04 NOTE — Progress Notes (Signed)
Cardiology Office Note  Date: 09/04/2019   ID: [name], DOB 1930-01-13, MRN [id number]  PCP:  Manon Hilding, MD  Cardiologist:  Rozann Lesches, MD Electrophysiologist:  None   Chief Complaint  Patient presents with  . Cardiac follow-up    History of Present Illness: [name] is an 85 y.o. female last seen in January.  She is referred back to the office by Dr. Quintin Alto for reevaluation of leg swelling.  This has been a long-term recurring problem for her in the setting of diastolic heart failure and pulmonary hypertension.  She has been on Lasix and recommended to use compression stockings.  We recently had her double her standing Lasix dose for a few days, she presents today stating that this was very effective and her legs feel better.  She is wearing her compression stockings as well.  She tells me that she typically does not use her Lasix on a daily basis, particularly if she has to run any errands does not want to have to go to the bathroom.  Echocardiogram from February of last year is reviewed below.  We discussed the possibility of a follow-up echocardiogram, however it is unlikely to change our current treatment plan and she already has reasons for present symptoms and leg swelling.  Past Medical History:  Diagnosis Date  . Arthritis   . Chronic diastolic heart failure ([place])   . Diverticulosis of colon (without mention of hemorrhage)    TCS by Dr. Gala Romney  . GERD (gastroesophageal reflux disease)   . Hemorrhoids, internal    TCS by Dr. Gala Romney  . Irritable bowel syndrome   . Migraine   . Mitral regurgitation   . Peripheral polyneuropathy    Confirmed by EMG and nerve conduction velocities   . Pulmonary nodule   . Seizures ([place]) 2011  . Syncope    Neurocardiogenic - positive tilt table test, on Florinef  . Tubular adenoma   . UTI (urinary tract infection)    Recurrent    Past Surgical History:  Procedure Laterality Date  . APPENDECTOMY    .  Back Pain     going to chiropracator for back and hip pain  . BACK SURGERY     x2  . bladder tack    . CATARACT EXTRACTION, BILATERAL  2017  . CHOLECYSTECTOMY    . COLONOSCOPY  06/16/2005   internal hemorrhoids, L side diverticula - Dr. Gala Romney  . COLONOSCOPY N/A 05/02/2013   Dr. Gala Romney- normal rectum, scattered L sided diverticula. bx= tubular adenoma  . ESOPHAGOGASTRODUODENOSCOPY  12/21/2006     Dr. Vivi Ferns esophagus/Patulous EG junction, small to moderate sized hiatal hernia,   multiple fundal gland type appearing polyps biopsied, otherwise normal  . FOOT SURGERY     cyst removed  . TOTAL ABDOMINAL HYSTERECTOMY    . TOTAL HIP ARTHROPLASTY Right 01/11/2017   Procedure: RIGHT TOTAL HIP ARTHROPLASTY ANTERIOR APPROACH;  Surgeon: Gaynelle Arabian, MD;  Location: [facility];  Service: Orthopedics;  Laterality: Right;  . WRIST SURGERY      Current Outpatient Medications  Medication Sig Dispense Refill  . Ascorbic Acid (VITAMIN C PO) Take by mouth daily.    Marland Kitchen aspirin EC 81 MG tablet Take 81 mg by mouth daily.    Marland Kitchen Bioflavonoid Products (BIOFLEX PO) Bioflex    . CALCIUM PO Take by mouth daily.    . Cholecalciferol (VITAMIN D) 2000 units CAPS Take by mouth daily.    . Cyanocobalamin (VITAMIN B12  PO) Take by mouth daily.    . diclofenac (VOLTAREN) 75 MG EC tablet Take 1 tablet by mouth 2 (two) times daily.    Marland Kitchen ESTRACE VAGINAL 0.1 MG/GM vaginal cream     . famotidine (PEPCID) 20 MG tablet Take 1 tablet by mouth at bedtime.    . fexofenadine (ALLEGRA) 180 MG tablet Take 180 mg by mouth daily.    . fludrocortisone (FLORINEF) 0.1 MG tablet TAKE 1 TABLET THREE TIMES A WEEK 36 tablet 3  . fluticasone (FLONASE) 50 MCG/ACT nasal spray Place 2 sprays into the nose as needed for allergies.     . furosemide (LASIX) 20 MG tablet Take 20 mg by mouth. Takes one 4-5 times per week    . furosemide (LASIX) 20 MG tablet Take 1 tablet (20 mg total) by mouth daily. You may take an extra (20) tablet for leg swelling  and tightness as needed for a few days, then resume 20 mg daily 180 tablet 3  . ipratropium (ATROVENT) 0.06 % nasal spray Place 1 spray into the nose as needed.    . Lactobacillus (PROBIOTIC ACIDOPHILUS PO) Take by mouth daily.    Marland Kitchen levothyroxine (SYNTHROID, LEVOTHROID) 75 MCG tablet Take 75 mcg by mouth daily before breakfast.    . methenamine (HIPREX) 1 g tablet Take 1 g by mouth 2 (two) times daily with a meal.    . mirabegron ER (MYRBETRIQ) 50 MG TB24 tablet Take 50 mg by mouth daily.    . montelukast (SINGULAIR) 10 MG tablet Take 10 mg by mouth at bedtime.     . Multiple Vitamin (MULTIVITAMIN) tablet Take 1 tablet by mouth daily.    . nitroGLYCERIN (NITROSTAT) 0.4 MG SL tablet Place 1 tablet (0.4 mg total) under the tongue every 5 (five) minutes x 3 doses as needed for chest pain (if no relief after 3rd dose, proceed to the ED for an evaluation). 75 tablet 0  . nystatin (MYCOSTATIN) 100000 UNIT/ML suspension Take 5 mLs by mouth 4 (four) times daily.    . Omega-3 Fatty Acids (FISH OIL) 1200 MG CAPS Take by mouth daily.    Vladimir Faster Glycol-Propyl Glycol (SYSTANE OP) Apply 1 drop to eye daily.     . polyethylene glycol (MIRALAX / GLYCOLAX) packet Take 17 g by mouth daily as needed.     . polyvinyl alcohol (LIQUIFILM TEARS) 1.4 % ophthalmic solution Place 1 drop into both eyes as needed for dry eyes. 15 mL 0  . Probiotic Product (PROBIOTIC PO) Take by mouth daily.    . RABEprazole (ACIPHEX) 20 MG tablet Take 1 tablet (20 mg total) by mouth 2 (two) times daily. 90 tablet 3  . topiramate (TOPAMAX) 50 MG tablet TAKE 2 TABLETS IN THE MORNING AND 3 TABLETS AT NIGHT 450 tablet 4  . triamcinolone cream (KENALOG) 0.1 % triamcinolone acetonide 0.1 % topical cream     No current facility-administered medications for this visit.    Allergies:  Cephalexin, Doxazosin mesylate, Levofloxacin, Codeine, Indomethacin, Ivp dye [iodinated diagnostic agents], Morphine, Sulfamethoxazole-trimethoprim, and  Penicillins   Social History: The patient  reports that she has never smoked. She has never used smokeless tobacco. She reports that she does not drink alcohol or use drugs.   ROS:  Please see the history of present illness. Otherwise, complete review of systems is positive for shortness of breath, intermittent leg swelling, arthritic pains.  All other systems are reviewed and negative.   Physical Exam: VS:  BP 106/62   Pulse  61   Temp (!) 97.3 F (36.3 C)   Ht 5\' 8"  (1.727 m)   Wt 177 lb (80.3 kg)   SpO2 96%   BMI 26.91 kg/m , BMI Body mass index is 26.91 kg/m.  Wt Readings from Last 3 Encounters:  09/04/19 177 lb (80.3 kg)  08/02/19 178 lb 9.6 oz (81 kg)  12/28/18 179 lb (81.2 kg)    General: Elderly woman, appears comfortable at rest. HEENT: Conjunctiva and lids normal, wearing a mask. Neck: Supple, no elevated JVP or carotid bruits, no thyromegaly. Lungs: Clear to auscultation, nonlabored breathing at rest. Cardiac: Regular rate and rhythm, no S3, 2/6 systolic murmur. Abdomen: Soft, nontender, bowel sounds present. Extremities: Mild ankle edema, distal pulses 1-2+. Skin: Warm and dry. Musculoskeletal: No kyphosis. Neuropsychiatric: Alert and oriented x3, affect grossly appropriate.  ECG:  An ECG dated 12/28/2018 was personally reviewed today and demonstrated:  Sinus bradycardia.  Recent Labwork:  08/16/2018: ALT 12; AST 21; BUN 12; Creat 0.82; Hemoglobin 12.8; Platelets 136; Potassium 4.0; Sodium 143; TSH 1.73   Other Studies Reviewed Today:  Echocardiogram 01/24/2018: Study Conclusions  - Left ventricle: The cavity size was normal. Wall thickness was   normal. Systolic function was normal. The estimated ejection   fraction was in the range of 60% to 65%. Wall motion was normal;   there were no regional wall motion abnormalities. - Aortic valve: Valve area (VTI): 3.17 cm^2. Valve area (Vmax):   2.76 cm^2. Valve area (Vmean): 2.76 cm^2. - Mitral valve: There was  mild regurgitation. - Right atrium: The atrium was mildly dilated. - Atrial septum: No defect or patent foramen ovale was identified. - Tricuspid valve: There was moderate regurgitation. - Pulmonary arteries: Systolic pressure was moderately increased.   PA peak pressure: 50 mm Hg (S).  Assessment and Plan:  1.  Bilateral leg edema, recurrent over time and in association with diastolic dysfunction and pulmonary hypertension.  Recommend continued use of compression stockings when she is ambulatory during waking hours.  I talked with her about using Lasix 20 mg each day instead of intermittently, she could even doubled the dose if swelling worsens temporarily.  I do not think that we need to necessarily pursue a follow-up echocardiogram as it is unlikely to change her current treatment plan.  2.  Neurocardiogenic syncope by history.  She has had no recurrent events.   Medication Adjustments/Labs and Tests Ordered: Current medicines are reviewed at length with the patient today.  Concerns regarding medicines are outlined above.   Tests Ordered: No orders of the defined types were placed in this encounter.   Medication Changes: Meds ordered this encounter  Medications  . furosemide (LASIX) 20 MG tablet    Sig: Take 1 tablet (20 mg total) by mouth daily. You may take an extra (20) tablet for leg swelling and tightness as needed for a few days, then resume 20 mg daily    Dispense:  180 tablet    Refill:  3    Disposition:  Follow up in January as scheduled.  Signed, Satira Sark, MD, [facility] 09/04/2019 10:42 AM    [facility] [address], [place], [place] [address] Phone: [phone]; Fax: [phone]

## 2019-09-04 NOTE — Patient Instructions (Addendum)
Medication Instructions:    Your physician has recommended you make the following change in your medication:   Take your furosemide 20 mg by mouth daily. If your legs become swollen and tight, you may double up (40 mg) on your furosemide for a few days, then resume 20 mg daily  Continue all other medications the same  Labwork:  NONE  Testing/Procedures:  NONE  Follow-Up:  Your physician recommends that you schedule a follow-up appointment in: as planned in January 2021. You will receive a letter in the mail in about 2 months reminding you to call and schedule this appointment.   Any Other Special Instructions Will Be Listed Below (If Applicable).  If you need a refill on your cardiac medications before your next appointment, please call your pharmacy.

## 2019-09-19 DIAGNOSIS — L280 Lichen simplex chronicus: Secondary | ICD-10-CM | POA: Diagnosis not present

## 2019-09-19 DIAGNOSIS — M25579 Pain in unspecified ankle and joints of unspecified foot: Secondary | ICD-10-CM | POA: Diagnosis not present

## 2019-09-19 DIAGNOSIS — M79672 Pain in left foot: Secondary | ICD-10-CM | POA: Diagnosis not present

## 2019-09-19 DIAGNOSIS — I872 Venous insufficiency (chronic) (peripheral): Secondary | ICD-10-CM | POA: Diagnosis not present

## 2019-09-19 DIAGNOSIS — M79671 Pain in right foot: Secondary | ICD-10-CM | POA: Diagnosis not present

## 2019-09-19 DIAGNOSIS — L570 Actinic keratosis: Secondary | ICD-10-CM | POA: Diagnosis not present

## 2019-10-18 DIAGNOSIS — I10 Essential (primary) hypertension: Secondary | ICD-10-CM | POA: Diagnosis not present

## 2019-10-22 DIAGNOSIS — M79671 Pain in right foot: Secondary | ICD-10-CM | POA: Diagnosis not present

## 2019-10-22 DIAGNOSIS — M79672 Pain in left foot: Secondary | ICD-10-CM | POA: Diagnosis not present

## 2019-10-22 DIAGNOSIS — M25579 Pain in unspecified ankle and joints of unspecified foot: Secondary | ICD-10-CM | POA: Diagnosis not present

## 2019-10-29 DIAGNOSIS — L309 Dermatitis, unspecified: Secondary | ICD-10-CM | POA: Diagnosis not present

## 2019-10-29 DIAGNOSIS — L280 Lichen simplex chronicus: Secondary | ICD-10-CM | POA: Diagnosis not present

## 2019-10-29 DIAGNOSIS — R519 Headache, unspecified: Secondary | ICD-10-CM | POA: Diagnosis not present

## 2019-10-29 DIAGNOSIS — N183 Chronic kidney disease, stage 3 unspecified: Secondary | ICD-10-CM | POA: Diagnosis not present

## 2019-10-29 DIAGNOSIS — L570 Actinic keratosis: Secondary | ICD-10-CM | POA: Diagnosis not present

## 2019-10-29 DIAGNOSIS — R682 Dry mouth, unspecified: Secondary | ICD-10-CM | POA: Diagnosis not present

## 2019-10-29 DIAGNOSIS — Z6826 Body mass index (BMI) 26.0-26.9, adult: Secondary | ICD-10-CM | POA: Diagnosis not present

## 2019-10-29 DIAGNOSIS — R5383 Other fatigue: Secondary | ICD-10-CM | POA: Diagnosis not present

## 2019-10-29 DIAGNOSIS — R609 Edema, unspecified: Secondary | ICD-10-CM | POA: Diagnosis not present

## 2019-10-31 DIAGNOSIS — Z87440 Personal history of urinary (tract) infections: Secondary | ICD-10-CM | POA: Diagnosis not present

## 2019-10-31 DIAGNOSIS — R3915 Urgency of urination: Secondary | ICD-10-CM | POA: Diagnosis not present

## 2019-10-31 DIAGNOSIS — R350 Frequency of micturition: Secondary | ICD-10-CM | POA: Diagnosis not present

## 2019-10-31 DIAGNOSIS — N952 Postmenopausal atrophic vaginitis: Secondary | ICD-10-CM | POA: Diagnosis not present

## 2019-11-01 ENCOUNTER — Other Ambulatory Visit: Payer: Self-pay

## 2019-11-01 ENCOUNTER — Ambulatory Visit (INDEPENDENT_AMBULATORY_CARE_PROVIDER_SITE_OTHER): Payer: Medicare Other | Admitting: Internal Medicine

## 2019-11-01 ENCOUNTER — Encounter: Payer: Self-pay | Admitting: Internal Medicine

## 2019-11-01 VITALS — BP 101/69 | HR 68 | Temp 97.6°F | Ht 68.0 in | Wt 175.8 lb

## 2019-11-01 DIAGNOSIS — K5909 Other constipation: Secondary | ICD-10-CM

## 2019-11-01 DIAGNOSIS — K9289 Other specified diseases of the digestive system: Secondary | ICD-10-CM

## 2019-11-01 DIAGNOSIS — K219 Gastro-esophageal reflux disease without esophagitis: Secondary | ICD-10-CM

## 2019-11-01 NOTE — Progress Notes (Signed)
Primary Care Physician:  Manon Hilding, MD Primary Gastroenterologist:  Dr. Gala Romney  Pre-Procedure History & Physical: HPI:  [name] is a 85 y.o. female here for follow-up of GERD and constipation.  Taking half a teaspoon of MiraLAX daily.  Reviewed stool diary.  For the most part , She is having regular bowel movements but occasionally goes to 3 days without a bowel movement.  For instance, bloated today and no BM in a day and a half..  Reflux symptoms fairly well controlled on rabeprazole.  No dysphagia.  Gas bloat symptoms worse in the evenings.  Past Medical History:  Diagnosis Date  . Arthritis   . Chronic diastolic heart failure ([place])   . Diverticulosis of colon (without mention of hemorrhage)    TCS by Dr. Gala Romney  . GERD (gastroesophageal reflux disease)   . Hemorrhoids, internal    TCS by Dr. Gala Romney  . Irritable bowel syndrome   . Migraine   . Mitral regurgitation   . Peripheral polyneuropathy    Confirmed by EMG and nerve conduction velocities   . Pulmonary nodule   . Seizures ([place]) 2011  . Syncope    Neurocardiogenic - positive tilt table test, on Florinef  . Tubular adenoma   . UTI (urinary tract infection)    Recurrent    Past Surgical History:  Procedure Laterality Date  . APPENDECTOMY    . Back Pain     going to chiropracator for back and hip pain  . BACK SURGERY     x2  . bladder tack    . CATARACT EXTRACTION, BILATERAL  2017  . CHOLECYSTECTOMY    . COLONOSCOPY  06/16/2005   internal hemorrhoids, L side diverticula - Dr. Gala Romney  . COLONOSCOPY N/A 05/02/2013   Dr. Gala Romney- normal rectum, scattered L sided diverticula. bx= tubular adenoma  . ESOPHAGOGASTRODUODENOSCOPY  12/21/2006     Dr. Vivi Ferns esophagus/Patulous EG junction, small to moderate sized hiatal hernia,   multiple fundal gland type appearing polyps biopsied, otherwise normal  . FOOT SURGERY     cyst removed  . TOTAL ABDOMINAL HYSTERECTOMY    . TOTAL HIP ARTHROPLASTY Right 01/11/2017    Procedure: RIGHT TOTAL HIP ARTHROPLASTY ANTERIOR APPROACH;  Surgeon: Gaynelle Arabian, MD;  Location: [facility];  Service: Orthopedics;  Laterality: Right;  . WRIST SURGERY      Prior to Admission medications   Medication Sig Start Date End Date Taking? Authorizing Provider  Ascorbic Acid (VITAMIN C PO) Take by mouth daily.   Yes [provider]  aspirin EC 81 MG tablet Take 81 mg by mouth daily.   Yes [provider]  Bioflavonoid Products (BIOFLEX PO) Bioflex   Yes [provider]  CALCIUM PO Take by mouth daily.   Yes [provider]  Cholecalciferol (VITAMIN D) 2000 units CAPS Take by mouth daily.   Yes [provider]  Cyanocobalamin (VITAMIN B12 PO) Take by mouth daily.   Yes [provider]  diclofenac (VOLTAREN) 75 MG EC tablet Take 1 tablet by mouth every morning.  06/10/19 06/09/20 Yes [provider]  ESTRACE VAGINAL 0.1 MG/GM vaginal cream  05/16/17  Yes [provider]  famotidine (PEPCID) 20 MG tablet Take 1 tablet by mouth as needed.  01/14/19  Yes [provider]  fexofenadine (ALLEGRA) 180 MG tablet Take 180 mg by mouth daily.   Yes [provider]  fludrocortisone (FLORINEF) 0.1 MG tablet TAKE 1 TABLET THREE TIMES A WEEK 05/02/19  Yes Satira Sark, MD  fluticasone [facility]) 50 MCG/ACT nasal spray Place 2 sprays into the nose as needed for allergies.    Yes [provider]  furosemide (LASIX) 20 MG tablet Take 1 tablet (20 mg total) by mouth daily. You may take an extra (20) tablet for leg swelling and tightness as needed for a few days, then resume 20 mg daily 09/04/19  Yes Satira Sark, MD  ipratropium (ATROVENT) 0.06 % nasal spray Place 1 spray into the nose as needed. 12/06/18  Yes [provider]  Lactobacillus (PROBIOTIC ACIDOPHILUS PO) Take by mouth daily.   Yes [provider]  levothyroxine (SYNTHROID, LEVOTHROID) 75 MCG tablet Take 75 mcg by mouth  daily before breakfast.   Yes [provider]  methenamine (HIPREX) 1 g tablet Take 1 g by mouth 2 (two) times daily with a meal.   Yes [provider]  mirabegron ER (MYRBETRIQ) 50 MG TB24 tablet Take 50 mg by mouth daily.   Yes [provider]  montelukast (SINGULAIR) 10 MG tablet Take 10 mg by mouth at bedtime.    Yes [provider]  Multiple Vitamin (MULTIVITAMIN) tablet Take 1 tablet by mouth daily.   Yes [provider]  nitroGLYCERIN (NITROSTAT) 0.4 MG SL tablet Place 1 tablet (0.4 mg total) under the tongue every 5 (five) minutes x 3 doses as needed for chest pain (if no relief after 3rd dose, proceed to the ED for an evaluation). 12/28/18  Yes Satira Sark, MD  nystatin (MYCOSTATIN) 100000 UNIT/ML suspension Take 5 mLs by mouth 4 (four) times daily. 07/12/19  Yes [provider]  Omega-3 Fatty Acids (FISH OIL) 1200 MG CAPS Take by mouth daily.   Yes [provider]  Polyethyl Glycol-Propyl Glycol (SYSTANE OP) Apply 1 drop to eye daily.    Yes [provider]  polyethylene glycol (MIRALAX / GLYCOLAX) packet Take 17 g by mouth daily.    Yes [provider]  polyvinyl alcohol (LIQUIFILM TEARS) 1.4 % ophthalmic solution Place 1 drop into both eyes as needed for dry eyes. 01/13/17  Yes Perkins, Alexzandrew L, PA-C  Probiotic Product (PROBIOTIC PO) Take by mouth daily.   Yes [provider]  RABEprazole (ACIPHEX) 20 MG tablet Take 1 tablet (20 mg total) by mouth 2 (two) times daily. 08/07/19  Yes Ahna Konkle, Cristopher Estimable, MD  topiramate (TOPAMAX) 50 MG tablet TAKE 2 TABLETS IN THE MORNING AND 3 TABLETS AT NIGHT 11/05/18  Yes Ward Givens, NP  triamcinolone cream (KENALOG) 0.1 % triamcinolone acetonide 0.1 % topical cream   Yes [provider]    Allergies as of 11/01/2019 - Review Complete 11/01/2019  Allergen Reaction Noted  . Cephalexin Anaphylaxis and Swelling 02/25/2009  . Doxazosin mesylate  Other (See Comments) 04/24/2013  . Levofloxacin Anaphylaxis and Rash 02/25/2009  . Codeine Other (See Comments) 02/25/2009  . Indomethacin Other (See Comments) 02/25/2009  . Ivp dye [iodinated diagnostic agents] Hives 04/24/2013  . Morphine Other (See Comments) 02/25/2009  . Sulfamethoxazole-trimethoprim Other (See Comments)   . Penicillins Other (See Comments) 02/25/2009    Family History  Problem Relation Age of Onset  . Stroke Father   . Colon cancer Neg Hx     Social History   Socioeconomic History  . Marital status: Widowed    Spouse name: Not on file  . Number of children: 4  . Years of education: college  . Highest education level: Not on file  Occupational History  Comment: Retired  Scientific laboratory technician  . Financial resource strain: Not on file  . Food insecurity    Worry: Not on file    Inability: Not on file  . Transportation needs    Medical: Not on file    Non-medical: Not on file  Tobacco Use  . Smoking status: Never Smoker  . Smokeless tobacco: Never Used  . Tobacco comment: tobacco use - no  Substance and Sexual Activity  . Alcohol use: No    Alcohol/week: 0.0 standard drinks  . Drug use: No  . Sexual activity: Not Currently  Lifestyle  . Physical activity    Days per week: Not on file    Minutes per session: Not on file  . Stress: Not on file  Relationships  . Social Herbalist on phone: Not on file    Gets together: Not on file    Attends religious service: Not on file    Active member of club or organization: Not on file    Attends meetings of clubs or organizations: Not on file    Relationship status: Not on file  . Intimate partner violence    Fear of current or ex partner: Not on file    Emotionally abused: Not on file    Physically abused: Not on file    Forced sexual activity: Not on file  Other Topics Concern  . Not on file  Social History Narrative   Retired, widowed, education college education .   Right handed.    Caffeine one cup of coffee daily.     Review of Systems: See HPI, otherwise negative ROS  Physical Exam: BP 101/69   Pulse 68   Temp 97.6 F (36.4 C) (Oral)   Ht 5\' 8"  (1.727 m)   Wt 175 lb 12.8 oz (79.7 kg)   BMI 26.73 kg/m  General:   Alert, somewhat frail pleasant and cooperative in NAD Abdomen: Non-distended, normal bowel sounds.  Soft and nontender without appreciable mass or hepatosplenomegaly.   Impression/Plan: 85 year old lady with longstanding GERD  -  fairly well controlled now on rabeprazole 20 mg twice daily.  A lower dose was proven not to be satisfactory previously.  Gas bloat nonspecific symptoms.  May be related to partially treated constipation.  No alarm features.   Recommendations:  Continue Rabeprazole 20 mg twice daily  [place] Famotidine on an as needed basis  Use Gas-ex as needed for gas/bloating  Information on diet for ga/bloat symptoms  Use Miralax 1/2 capful alternating with one capful every other day  Office visit in 5 months      Notice: This dictation was prepared with Dragon dictation along with smaller phrase technology. Any transcriptional errors that result from this process are unintentional and may not be corrected upon review.

## 2019-11-01 NOTE — Patient Instructions (Signed)
Continue Rabeprazole 20 mg twice daily  [place] Famotidine on an as needed basis  Use Gas-ex as needed for gas/bloating  Information on diet for ga/bloat symptoms  Use Miralax 1/2 capful alternating with one capful every other day  Office visit in 5 months

## 2019-11-11 DIAGNOSIS — M79672 Pain in left foot: Secondary | ICD-10-CM | POA: Diagnosis not present

## 2019-11-11 DIAGNOSIS — M2042 Other hammer toe(s) (acquired), left foot: Secondary | ICD-10-CM | POA: Diagnosis not present

## 2019-11-11 DIAGNOSIS — I739 Peripheral vascular disease, unspecified: Secondary | ICD-10-CM | POA: Diagnosis not present

## 2019-11-11 DIAGNOSIS — M25579 Pain in unspecified ankle and joints of unspecified foot: Secondary | ICD-10-CM | POA: Diagnosis not present

## 2019-11-11 DIAGNOSIS — M2041 Other hammer toe(s) (acquired), right foot: Secondary | ICD-10-CM | POA: Diagnosis not present

## 2019-11-11 DIAGNOSIS — L110 Acquired keratosis follicularis: Secondary | ICD-10-CM | POA: Diagnosis not present

## 2019-11-11 DIAGNOSIS — M79671 Pain in right foot: Secondary | ICD-10-CM | POA: Diagnosis not present

## 2019-11-19 DIAGNOSIS — R609 Edema, unspecified: Secondary | ICD-10-CM | POA: Diagnosis not present

## 2019-11-19 DIAGNOSIS — Z6827 Body mass index (BMI) 27.0-27.9, adult: Secondary | ICD-10-CM | POA: Diagnosis not present

## 2019-11-21 DIAGNOSIS — M79671 Pain in right foot: Secondary | ICD-10-CM | POA: Diagnosis not present

## 2019-11-21 DIAGNOSIS — R2243 Localized swelling, mass and lump, lower limb, bilateral: Secondary | ICD-10-CM | POA: Diagnosis not present

## 2019-11-21 DIAGNOSIS — M79604 Pain in right leg: Secondary | ICD-10-CM | POA: Diagnosis not present

## 2019-11-21 DIAGNOSIS — M7989 Other specified soft tissue disorders: Secondary | ICD-10-CM | POA: Diagnosis not present

## 2019-11-21 DIAGNOSIS — M79662 Pain in left lower leg: Secondary | ICD-10-CM | POA: Diagnosis not present

## 2019-11-21 DIAGNOSIS — M79605 Pain in left leg: Secondary | ICD-10-CM | POA: Diagnosis not present

## 2019-11-21 DIAGNOSIS — M25579 Pain in unspecified ankle and joints of unspecified foot: Secondary | ICD-10-CM | POA: Diagnosis not present

## 2019-11-21 DIAGNOSIS — M79661 Pain in right lower leg: Secondary | ICD-10-CM | POA: Diagnosis not present

## 2019-11-21 DIAGNOSIS — M79672 Pain in left foot: Secondary | ICD-10-CM | POA: Diagnosis not present

## 2019-11-26 DIAGNOSIS — Z20828 Contact with and (suspected) exposure to other viral communicable diseases: Secondary | ICD-10-CM | POA: Diagnosis not present

## 2019-11-26 DIAGNOSIS — A084 Viral intestinal infection, unspecified: Secondary | ICD-10-CM | POA: Diagnosis not present

## 2019-12-17 ENCOUNTER — Other Ambulatory Visit: Payer: Self-pay | Admitting: Adult Health

## 2019-12-24 DIAGNOSIS — M25579 Pain in unspecified ankle and joints of unspecified foot: Secondary | ICD-10-CM | POA: Diagnosis not present

## 2019-12-24 DIAGNOSIS — M79672 Pain in left foot: Secondary | ICD-10-CM | POA: Diagnosis not present

## 2019-12-24 DIAGNOSIS — M79671 Pain in right foot: Secondary | ICD-10-CM | POA: Diagnosis not present

## 2019-12-30 DIAGNOSIS — N644 Mastodynia: Secondary | ICD-10-CM | POA: Diagnosis not present

## 2019-12-30 DIAGNOSIS — R5383 Other fatigue: Secondary | ICD-10-CM | POA: Diagnosis not present

## 2019-12-30 DIAGNOSIS — R42 Dizziness and giddiness: Secondary | ICD-10-CM | POA: Diagnosis not present

## 2019-12-30 DIAGNOSIS — N183 Chronic kidney disease, stage 3 unspecified: Secondary | ICD-10-CM | POA: Diagnosis not present

## 2019-12-30 DIAGNOSIS — E039 Hypothyroidism, unspecified: Secondary | ICD-10-CM | POA: Diagnosis not present

## 2020-01-04 NOTE — Progress Notes (Signed)
Virtual Visit via Telephone Note   This visit type was conducted due to national recommendations for restrictions regarding the COVID-19 Pandemic (e.g. social distancing) in an effort to limit this patient's exposure and mitigate transmission in our community.  Due to her co-morbid illnesses, this patient is at least at moderate risk for complications without adequate follow up.  This format is felt to be most appropriate for this patient at this time.  The patient did not have access to video technology/had technical difficulties with video requiring transitioning to audio format only (telephone).  All issues noted in this document were discussed and addressed.  No physical exam could be performed with this format.  Please refer to the patient's chart for her  consent to telehealth for [facility].   Date:  01/06/2020   ID:  [name], DOB 1930-08-07, MRN [id number]  Patient Location: Home Provider Location: Office  PCP:  Manon Hilding, MD  Cardiologist:  Rozann Lesches, MD Electrophysiologist:  None   Evaluation Performed:  Follow-Up Visit  Chief Complaint:  Cardiac follow-up  History of Present Illness:    [name] is an 86 y.o. female last seen in September 2020.  We spoke by phone today.  She complains of leg pain and swelling, skin tightness with some erythema.  This has been a chronic, recurring problem.  She has not tried to intensify her Lasix, but does state that she is watching salt in her diet and also elevating her legs.  She continues to follow with Dr. Quintin Alto.  I reviewed her medications which are outlined below.  Current regimen includes low-dose aspirin, Lasix 20 mg daily, also Florinef.  The patient does not have symptoms concerning for COVID-19 infection (fever, chills, cough, or new shortness of breath).  We discussed the vaccine today, she is not sure that she will get it with concerns about multiple allergies.   Past Medical History:    Diagnosis Date  . Arthritis   . Chronic diastolic heart failure ([place])   . Diverticulosis of colon (without mention of hemorrhage)    TCS by Dr. Gala Romney  . GERD (gastroesophageal reflux disease)   . Hemorrhoids, internal    TCS by Dr. Gala Romney  . Irritable bowel syndrome   . Migraine   . Mitral regurgitation   . Peripheral polyneuropathy    Confirmed by EMG and nerve conduction velocities   . Pulmonary nodule   . Seizures ([place]) 2011  . Syncope    Neurocardiogenic - positive tilt table test, on Florinef  . Tubular adenoma   . UTI (urinary tract infection)    Recurrent   Past Surgical History:  Procedure Laterality Date  . APPENDECTOMY    . Back Pain     going to chiropracator for back and hip pain  . BACK SURGERY     x2  . bladder tack    . CATARACT EXTRACTION, BILATERAL  2017  . CHOLECYSTECTOMY    . COLONOSCOPY  06/16/2005   internal hemorrhoids, L side diverticula - Dr. Gala Romney  . COLONOSCOPY N/A 05/02/2013   Dr. Gala Romney- normal rectum, scattered L sided diverticula. bx= tubular adenoma  . ESOPHAGOGASTRODUODENOSCOPY  12/21/2006     Dr. Vivi Ferns esophagus/Patulous EG junction, small to moderate sized hiatal hernia,   multiple fundal gland type appearing polyps biopsied, otherwise normal  . FOOT SURGERY     cyst removed  . TOTAL ABDOMINAL HYSTERECTOMY    . TOTAL HIP ARTHROPLASTY Right 01/11/2017   Procedure: RIGHT  TOTAL HIP ARTHROPLASTY ANTERIOR APPROACH;  Surgeon: Gaynelle Arabian, MD;  Location: [facility];  Service: Orthopedics;  Laterality: Right;  . WRIST SURGERY       Current Meds  Medication Sig  . Ascorbic Acid (VITAMIN C PO) Take by mouth daily.  Marland Kitchen aspirin EC 81 MG tablet Take 81 mg by mouth daily.  Marland Kitchen Bioflavonoid Products (BIOFLEX PO) Bioflex  . CALCIUM PO Take by mouth daily.  . Cholecalciferol (VITAMIN D) 2000 units CAPS Take by mouth daily.  . Cyanocobalamin (VITAMIN B12 PO) Take by mouth daily.  . diclofenac (VOLTAREN) 75 MG EC tablet Take 1 tablet by mouth 2 (two)  times daily.   Marland Kitchen ESTRACE VAGINAL 0.1 MG/GM vaginal cream   . famotidine (PEPCID) 20 MG tablet Take 1 tablet by mouth as needed.   . fexofenadine (ALLEGRA) 180 MG tablet Take 180 mg by mouth daily.  . fludrocortisone (FLORINEF) 0.1 MG tablet TAKE 1 TABLET THREE TIMES A WEEK  . fluticasone (FLONASE) 50 MCG/ACT nasal spray Place 2 sprays into the nose as needed for allergies.   . furosemide (LASIX) 20 MG tablet Take 20 mg by mouth. TAKE 20 MG EVERY OTHER DAY  . ipratropium (ATROVENT) 0.06 % nasal spray Place 1 spray into the nose as needed.  . Lactobacillus (PROBIOTIC ACIDOPHILUS PO) Take by mouth daily.  Marland Kitchen levothyroxine (SYNTHROID, LEVOTHROID) 75 MCG tablet Take 75 mcg by mouth daily before breakfast.  . methenamine (HIPREX) 1 g tablet Take 1 g by mouth 2 (two) times daily with a meal.  . mirabegron ER (MYRBETRIQ) 50 MG TB24 tablet Take 50 mg by mouth daily.  . montelukast (SINGULAIR) 10 MG tablet Take 10 mg by mouth at bedtime.   . Multiple Vitamin (MULTIVITAMIN) tablet Take 1 tablet by mouth daily.  . nitroGLYCERIN (NITROSTAT) 0.4 MG SL tablet Place 1 tablet (0.4 mg total) under the tongue every 5 (five) minutes x 3 doses as needed for chest pain (if no relief after 3rd dose, proceed to the ED for an evaluation).  . nystatin (MYCOSTATIN) 100000 UNIT/ML suspension Take 5 mLs by mouth 4 (four) times daily.  Vladimir Faster Glycol-Propyl Glycol (SYSTANE OP) Apply 1 drop to eye daily.   . polyethylene glycol (MIRALAX / GLYCOLAX) packet Take 17 g by mouth daily.   . polyvinyl alcohol (LIQUIFILM TEARS) 1.4 % ophthalmic solution Place 1 drop into both eyes as needed for dry eyes.  . Probiotic Product (PROBIOTIC PO) Take by mouth daily.  . RABEprazole (ACIPHEX) 20 MG tablet Take 1 tablet (20 mg total) by mouth 2 (two) times daily.  Marland Kitchen topiramate (TOPAMAX) 50 MG tablet TAKE 2 TABLETS IN THE MORNING AND 3 TABLETS AT NIGHT  . triamcinolone cream (KENALOG) 0.1 % triamcinolone acetonide 0.1 % topical cream  .  [DISCONTINUED] furosemide (LASIX) 20 MG tablet Take 1 tablet (20 mg total) by mouth daily. You may take an extra (20) tablet for leg swelling and tightness as needed for a few days, then resume 20 mg daily (Patient taking differently: Take 20 mg by mouth daily. TAKE 20 MG EVERY OTHER DAY ALTERNATING WITH 40 MG EVERY OTHER DAY)  . [DISCONTINUED] Omega-3 Fatty Acids (FISH OIL) 1200 MG CAPS Take by mouth daily.     Allergies:   Cephalexin, Doxazosin mesylate, Levofloxacin, Codeine, Indomethacin, Ivp dye [iodinated diagnostic agents], Morphine, Sulfamethoxazole-trimethoprim, and Penicillins   Social History   Tobacco Use  . Smoking status: Never Smoker  . Smokeless tobacco: Never Used  . Tobacco comment: tobacco use -  no  Substance Use Topics  . Alcohol use: No    Alcohol/week: 0.0 standard drinks  . Drug use: No     Family Hx: The patient's family history includes Stroke in her father. There is no history of Colon cancer.  ROS:   Please see the history of present illness. All other systems reviewed and are negative.   Prior CV studies:   The following studies were reviewed today:  Echocardiogram 01/24/2018: Study Conclusions  - Left ventricle: The cavity size was normal. Wall thickness was normal. Systolic function was normal. The estimated ejection fraction was in the range of 60% to 65%. Wall motion was normal; there were no regional wall motion abnormalities. - Aortic valve: Valve area (VTI): 3.17 cm^2. Valve area (Vmax): 2.76 cm^2. Valve area (Vmean): 2.76 cm^2. - Mitral valve: There was mild regurgitation. - Right atrium: The atrium was mildly dilated. - Atrial septum: No defect or patent foramen ovale was identified. - Tricuspid valve: There was moderate regurgitation. - Pulmonary arteries: Systolic pressure was moderately increased. PA peak pressure: 50 mm Hg (S).  Labs/Other Tests and Data Reviewed:    EKG:  An ECG dated 12/28/2018 was personally reviewed  today and demonstrated:  Sinus bradycardia.  Recent Labs:  08/16/2018: ALT 12; AST 21; BUN 12; Creat 0.82; Hemoglobin 12.8; Platelets 136; Potassium 4.0; Sodium 143; TSH 1.73  Wt Readings from Last 3 Encounters:  01/06/20 175 lb (79.4 kg)  11/01/19 175 lb 12.8 oz (79.7 kg)  09/04/19 177 lb (80.3 kg)     Objective:    Vital Signs:  BP 118/71   Pulse 62   Temp (!) 97.3 F (36.3 C)   Ht 5\' 8"  (1.727 m)   Wt 175 lb (79.4 kg)   BMI 26.61 kg/m    Patient spoke in full sentences, not short of breath. No audible wheezing or coughing. Speech pattern normal.  ASSESSMENT & PLAN:    1.  Chronic recurring bilateral leg edema in association with diastolic dysfunction and pulmonary hypertension.  Continue to elevate legs when able, watch salt in the diet.  She will increase Lasix to 20 mg alternating with 40 mg every other day and see if this is helpful.  We might need to consider Demadex if higher dose Lasix is not effective.  2.  History of neurocardiogenic syncope.  No recurrences.  She does remain on low-dose Florinef.  COVID-19 Education: The signs and symptoms of COVID-19 were discussed with the patient and how to seek care for testing (follow up with PCP or arrange E-visit).  The importance of social distancing was discussed today.  Time:   Today, I have spent 8 minutes with the patient with telehealth technology discussing the above problems.     Medication Adjustments/Labs and Tests Ordered: Current medicines are reviewed at length with the patient today.  Concerns regarding medicines are outlined above.   Tests Ordered: No orders of the defined types were placed in this encounter.   Medication Changes: No orders of the defined types were placed in this encounter.   Follow Up:  In Person 6 months in the [address] office.  Signed, Rozann Lesches, MD  01/06/2020 11:50 AM    [place]

## 2020-01-06 ENCOUNTER — Telehealth (INDEPENDENT_AMBULATORY_CARE_PROVIDER_SITE_OTHER): Payer: Medicare Other | Admitting: Cardiology

## 2020-01-06 ENCOUNTER — Encounter: Payer: Self-pay | Admitting: Cardiology

## 2020-01-06 VITALS — BP 118/71 | HR 62 | Temp 97.3°F | Ht 68.0 in | Wt 175.0 lb

## 2020-01-06 DIAGNOSIS — R55 Syncope and collapse: Secondary | ICD-10-CM

## 2020-01-06 DIAGNOSIS — I5032 Chronic diastolic (congestive) heart failure: Secondary | ICD-10-CM | POA: Diagnosis not present

## 2020-01-06 DIAGNOSIS — R600 Localized edema: Secondary | ICD-10-CM | POA: Diagnosis not present

## 2020-01-06 NOTE — Patient Instructions (Signed)
Your physician wants you to follow-up in: [place] will receive a reminder letter in the mail two months in advance. If you don't receive a letter, please call our office to schedule the follow-up appointment.  Your physician has recommended you make the following change in your medication:   TAKE LASIX 20 MG EVERY OTHER DAY ALTERNATING WITH 40 MG EVERY OTHER DAY   Thank you for choosing [facility]!!

## 2020-01-07 ENCOUNTER — Other Ambulatory Visit: Payer: Self-pay | Admitting: Internal Medicine

## 2020-01-08 DIAGNOSIS — N644 Mastodynia: Secondary | ICD-10-CM | POA: Diagnosis not present

## 2020-01-17 DIAGNOSIS — I5030 Unspecified diastolic (congestive) heart failure: Secondary | ICD-10-CM | POA: Diagnosis not present

## 2020-01-17 DIAGNOSIS — N183 Chronic kidney disease, stage 3 unspecified: Secondary | ICD-10-CM | POA: Diagnosis not present

## 2020-01-20 DIAGNOSIS — L110 Acquired keratosis follicularis: Secondary | ICD-10-CM | POA: Diagnosis not present

## 2020-01-20 DIAGNOSIS — M79672 Pain in left foot: Secondary | ICD-10-CM | POA: Diagnosis not present

## 2020-01-20 DIAGNOSIS — M25579 Pain in unspecified ankle and joints of unspecified foot: Secondary | ICD-10-CM | POA: Diagnosis not present

## 2020-01-20 DIAGNOSIS — M2041 Other hammer toe(s) (acquired), right foot: Secondary | ICD-10-CM | POA: Diagnosis not present

## 2020-01-20 DIAGNOSIS — M79671 Pain in right foot: Secondary | ICD-10-CM | POA: Diagnosis not present

## 2020-01-20 DIAGNOSIS — M2042 Other hammer toe(s) (acquired), left foot: Secondary | ICD-10-CM | POA: Diagnosis not present

## 2020-01-20 DIAGNOSIS — I739 Peripheral vascular disease, unspecified: Secondary | ICD-10-CM | POA: Diagnosis not present

## 2020-01-22 ENCOUNTER — Encounter: Payer: Self-pay | Admitting: Adult Health

## 2020-01-22 ENCOUNTER — Other Ambulatory Visit: Payer: Self-pay

## 2020-01-22 ENCOUNTER — Ambulatory Visit (INDEPENDENT_AMBULATORY_CARE_PROVIDER_SITE_OTHER): Payer: Medicare Other | Admitting: Adult Health

## 2020-01-22 VITALS — BP 104/64 | HR 65 | Temp 98.5°F | Ht 67.0 in | Wt 172.0 lb

## 2020-01-22 DIAGNOSIS — R569 Unspecified convulsions: Secondary | ICD-10-CM | POA: Diagnosis not present

## 2020-01-22 DIAGNOSIS — G629 Polyneuropathy, unspecified: Secondary | ICD-10-CM | POA: Diagnosis not present

## 2020-01-22 DIAGNOSIS — R519 Headache, unspecified: Secondary | ICD-10-CM

## 2020-01-22 NOTE — Progress Notes (Signed)
I agree with the above plan 

## 2020-01-22 NOTE — Progress Notes (Signed)
PATIENT: [name] DOB: [birth year]  REASON FOR VISIT: follow up HISTORY FROM: patient  HISTORY OF PRESENT ILLNESS: Today 01/22/20:  Mr. [name] is an 86 year old female with a history of migraine headaches, seizures and peripheral neuropathy.  She returns today for follow-up.  Overall she feels that she has remained stable.  She feels that Topamax has offered her some benefit with her headache.  Although most days she feels as if she is wearing a hat.  She continues to have burning and tingling in the feet.  She reports that her podiatrist is giving her B12 injections and she feels that this has been beneficial for the burning sensations.  She continues to feel off balance.  She had a fall back in January but fortunately did not suffer any significant injuries other than bruising.  She denies any seizure events.  She returns today for follow-up.  HISTORY  05/21/19:  Ms. [name] is an 86 year old female with a history of migraine headaches, seizures, peripheral neuropathy and abnormality of gait.  She joins me today for virtual visit.  Overall she says her headaches are under relatively good control.  She continues to have trouble with neuropathy.  Describes burning and tingling sensations in both feet.  She reports that she had a fall in late February.  Fortunately she did not suffer any injuries.  She does use a cane or walker but her daughter states that she typically carries a cane instead of actually using it.  She saw her orthopedist in March and is now wearing a left ankle brace and did receive a steroid injection.  She remains on Topamax 100 mg twice a day.  She denies any seizure events.  She joins me today for virtual visit.  REVIEW OF SYSTEMS: Out of a complete 14 system review of symptoms, the patient complains only of the following symptoms, and all other reviewed systems are negative.  ALLERGIES: Allergies  Allergen Reactions  . Cephalexin Anaphylaxis and Swelling  .  Doxazosin Mesylate Other (See Comments)    Made patient EXTREMELY sick.   . Levofloxacin Anaphylaxis and Rash  . Codeine Other (See Comments)    Hallucinations  . Indomethacin Other (See Comments)    Severe Headache   . Ivp Dye [Iodinated Diagnostic Agents] Hives  . Morphine Other (See Comments)    Hallucinations   . Sulfamethoxazole-Trimethoprim Other (See Comments)    Unknown   . Penicillins Other (See Comments)    Made patient go into shock Has patient had a PCN reaction causing immediate rash, facial/tongue/throat swelling, SOB or lightheadedness with hypotension: Yes Has patient had a PCN reaction causing severe rash involving mucus membranes or skin necrosis: No Has patient had a PCN reaction that required hospitalization No Has patient had a PCN reaction occurring within the last 10 years: No If all of the above answers are "NO", then may proceed with Cephalosporin use.     HOME MEDICATIONS: Outpatient Medications Prior to Visit  Medication Sig Dispense Refill  . Ascorbic Acid (VITAMIN C PO) Take by mouth daily.    Marland Kitchen aspirin EC 81 MG tablet Take 81 mg by mouth daily.    Marland Kitchen Bioflavonoid Products (BIOFLEX PO) Bioflex    . CALCIUM PO Take by mouth daily.    . Cholecalciferol (VITAMIN D) 2000 units CAPS Take by mouth daily.    . Cyanocobalamin (VITAMIN B12 PO) Take by mouth daily.    . diclofenac (VOLTAREN) 75 MG EC tablet Take 1 tablet by  mouth 2 (two) times daily.     Marland Kitchen ESTRACE VAGINAL 0.1 MG/GM vaginal cream     . fludrocortisone (FLORINEF) 0.1 MG tablet TAKE 1 TABLET THREE TIMES A WEEK 36 tablet 3  . fluticasone (FLONASE) 50 MCG/ACT nasal spray Place 2 sprays into the nose as needed for allergies.     . furosemide (LASIX) 20 MG tablet Take 20 mg by mouth. TAKE 20 MG EVERY OTHER DAY    . ipratropium (ATROVENT) 0.06 % nasal spray Place 1 spray into the nose as needed.    Marland Kitchen levothyroxine (SYNTHROID, LEVOTHROID) 75 MCG tablet Take 75 mcg by mouth daily before breakfast.    .  methenamine (HIPREX) 1 g tablet Take 1 g by mouth 2 (two) times daily with a meal.    . mirabegron ER (MYRBETRIQ) 50 MG TB24 tablet Take 50 mg by mouth daily.    . montelukast (SINGULAIR) 10 MG tablet Take 10 mg by mouth at bedtime.     . Multiple Vitamin (MULTIVITAMIN) tablet Take 1 tablet by mouth daily.    . nitroGLYCERIN (NITROSTAT) 0.4 MG SL tablet Place 1 tablet (0.4 mg total) under the tongue every 5 (five) minutes x 3 doses as needed for chest pain (if no relief after 3rd dose, proceed to the ED for an evaluation). 75 tablet 0  . Polyethyl Glycol-Propyl Glycol (SYSTANE OP) Apply 1 drop to eye daily.     . polyethylene glycol (MIRALAX / GLYCOLAX) packet Take 17 g by mouth daily.     . polyvinyl alcohol (LIQUIFILM TEARS) 1.4 % ophthalmic solution Place 1 drop into both eyes as needed for dry eyes. 15 mL 0  . Probiotic Product (PROBIOTIC PO) Take by mouth daily.    . RABEprazole (ACIPHEX) 20 MG tablet TAKE 1 TABLET TWICE A DAY 180 tablet 3  . topiramate (TOPAMAX) 50 MG tablet TAKE 2 TABLETS IN THE MORNING AND 3 TABLETS AT NIGHT 450 tablet 3  . triamcinolone cream (KENALOG) 0.1 % triamcinolone acetonide 0.1 % topical cream    . Lactobacillus (PROBIOTIC ACIDOPHILUS PO) Take by mouth daily.    Marland Kitchen nystatin (MYCOSTATIN) 100000 UNIT/ML suspension Take 5 mLs by mouth 4 (four) times daily.    . famotidine (PEPCID) 20 MG tablet Take 1 tablet by mouth as needed.     . fexofenadine (ALLEGRA) 180 MG tablet Take 180 mg by mouth daily.     No facility-administered medications prior to visit.    PAST MEDICAL HISTORY: Past Medical History:  Diagnosis Date  . Arthritis   . Chronic diastolic heart failure ([place])   . Diverticulosis of colon (without mention of hemorrhage)    TCS by Dr. Gala Romney  . GERD (gastroesophageal reflux disease)   . Hemorrhoids, internal    TCS by Dr. Gala Romney  . Irritable bowel syndrome   . Migraine   . Mitral regurgitation   . Peripheral polyneuropathy    Confirmed by EMG and  nerve conduction velocities   . Pulmonary nodule   . Seizures ([place]) 2011  . Syncope    Neurocardiogenic - positive tilt table test, on Florinef  . Tubular adenoma   . UTI (urinary tract infection)    Recurrent    PAST SURGICAL HISTORY: Past Surgical History:  Procedure Laterality Date  . APPENDECTOMY    . Back Pain     going to chiropracator for back and hip pain  . BACK SURGERY     x2  . bladder tack    . CATARACT EXTRACTION,  BILATERAL  2017  . CHOLECYSTECTOMY    . COLONOSCOPY  06/16/2005   internal hemorrhoids, L side diverticula - Dr. Gala Romney  . COLONOSCOPY N/A 05/02/2013   Dr. Gala Romney- normal rectum, scattered L sided diverticula. bx= tubular adenoma  . ESOPHAGOGASTRODUODENOSCOPY  12/21/2006     Dr. Vivi Ferns esophagus/Patulous EG junction, small to moderate sized hiatal hernia,   multiple fundal gland type appearing polyps biopsied, otherwise normal  . FOOT SURGERY     cyst removed  . TOTAL ABDOMINAL HYSTERECTOMY    . TOTAL HIP ARTHROPLASTY Right 01/11/2017   Procedure: RIGHT TOTAL HIP ARTHROPLASTY ANTERIOR APPROACH;  Surgeon: Gaynelle Arabian, MD;  Location: [facility];  Service: Orthopedics;  Laterality: Right;  . WRIST SURGERY      FAMILY HISTORY: Family History  Problem Relation Age of Onset  . Stroke Father   . Colon cancer Neg Hx     SOCIAL HISTORY: Social History   Socioeconomic History  . Marital status: Widowed    Spouse name: Not on file  . Number of children: 4  . Years of education: college  . Highest education level: Not on file  Occupational History    Comment: Retired  Tobacco Use  . Smoking status: Never Smoker  . Smokeless tobacco: Never Used  . Tobacco comment: tobacco use - no  Substance and Sexual Activity  . Alcohol use: No    Alcohol/week: 0.0 standard drinks  . Drug use: No  . Sexual activity: Not Currently  Other Topics Concern  . Not on file  Social History Narrative   Retired, widowed, education college education .   Right handed.     Caffeine one cup of coffee daily.    Social Determinants of Health   Financial Resource Strain:   . Difficulty of Paying Living Expenses: Not on file  Food Insecurity:   . Worried About Charity fundraiser in the Last Year: Not on file  . Ran Out of Food in the Last Year: Not on file  Transportation Needs:   . Lack of Transportation (Medical): Not on file  . Lack of Transportation (Non-Medical): Not on file  Physical Activity:   . Days of Exercise per Week: Not on file  . Minutes of Exercise per Session: Not on file  Stress:   . Feeling of Stress : Not on file  Social Connections:   . Frequency of Communication with Friends and Family: Not on file  . Frequency of Social Gatherings with Friends and Family: Not on file  . Attends Religious Services: Not on file  . Active Member of Clubs or Organizations: Not on file  . Attends Archivist Meetings: Not on file  . Marital Status: Not on file  Intimate Partner Violence:   . Fear of Current or Ex-Partner: Not on file  . Emotionally Abused: Not on file  . Physically Abused: Not on file  . Sexually Abused: Not on file      PHYSICAL EXAM  Vitals:   01/22/20 1132  BP: 104/64  Pulse: 65  Temp: 98.5 F (36.9 C)  Weight: 172 lb (78 kg)  Height: 5\' 7"  (1.702 m)   Body mass index is 26.94 kg/m.  Generalized: Well developed, in no acute distress   Neurological examination  Mentation: Alert oriented to time, place, history taking. Follows all commands speech and language fluent Cranial nerve II-XII: Pupils were equal round reactive to light. Extraocular movements were full, visual field were full on confrontational test.. Head turning and  shoulder shrug  were normal and symmetric. Motor: The motor testing reveals 5 over 5 strength of all 4 extremities. Good symmetric motor tone is noted throughout.  Sensory: Sensory testing is intact to soft touch on all 4 extremities. No evidence of extinction is noted.   Coordination: Cerebellar testing reveals good finger-nose-finger and heel-to-shin bilaterally.  Gait and station: Patient uses a cane when ambulating  DIAGNOSTIC DATA (LABS, IMAGING, TESTING) - I reviewed patient records, labs, notes, testing and imaging myself where available.  Lab Results  Component Value Date   WBC 4.4 08/16/2018   HGB 12.8 08/16/2018   HCT 38.7 08/16/2018   MCV 82.0 08/16/2018   PLT 136 (L) 08/16/2018      Component Value Date/Time   NA 143 08/16/2018 1153   K 4.0 08/16/2018 1153   CL 109 08/16/2018 1153   CO2 27 08/16/2018 1153   GLUCOSE 86 08/16/2018 1153   BUN 12 08/16/2018 1153   CREATININE 0.82 08/16/2018 1153   CALCIUM 9.4 08/16/2018 1153   PROT 6.3 08/16/2018 1153   ALBUMIN 3.9 01/06/2017 1518   AST 21 08/16/2018 1153   ALT 12 08/16/2018 1153   ALKPHOS 57 01/06/2017 1518   BILITOT 0.6 08/16/2018 1153   GFRNONAA 65 08/16/2018 1153   GFRAA 75 08/16/2018 1153   No results found for: CHOL, HDL, LDLCALC, LDLDIRECT, TRIG, CHOLHDL No results found for: HGBA1C No results found for: VITAMINB12 Lab Results  Component Value Date   TSH 1.73 08/16/2018      ASSESSMENT AND PLAN 86 y.o. year old female  has a past medical history of Arthritis, Chronic diastolic heart failure ([place]), Diverticulosis of colon (without mention of hemorrhage), GERD (gastroesophageal reflux disease), Hemorrhoids, internal, Irritable bowel syndrome, Migraine, Mitral regurgitation, Peripheral polyneuropathy, Pulmonary nodule, Seizures ([place]) (2011), Syncope, Tubular adenoma, and UTI (urinary tract infection). here with:  1.  Daily headache 2.  Seizures 3.  Peripheral neuropathy  The patient will continue on Topamax.  We discussed potentially adding on a different medication for her daily headaches however the patient refused.  She does not want to take any additional medication.  She will continue to monitor symptoms.  I have advised that if her symptoms worsen or she develops  new symptoms she should let us know.      Ward Givens, MSN, NP-C 01/22/2020, 11:44 AM [facility] [address] [place], [place] [address] [phone]

## 2020-01-22 NOTE — Patient Instructions (Signed)
Your Plan:  Continue Topamax If your symptoms worsen or you develop new symptoms please let us know.   Thank you for coming to see us at [facility]. I hope we have been able to provide you high quality care today.  You may receive a patient satisfaction survey over the next few weeks. We would appreciate your feedback and comments so that we may continue to improve ourselves and the health of our patients.  

## 2020-01-28 DIAGNOSIS — M25579 Pain in unspecified ankle and joints of unspecified foot: Secondary | ICD-10-CM | POA: Diagnosis not present

## 2020-01-28 DIAGNOSIS — M79672 Pain in left foot: Secondary | ICD-10-CM | POA: Diagnosis not present

## 2020-01-28 DIAGNOSIS — M79671 Pain in right foot: Secondary | ICD-10-CM | POA: Diagnosis not present

## 2020-02-14 DIAGNOSIS — E7849 Other hyperlipidemia: Secondary | ICD-10-CM | POA: Diagnosis not present

## 2020-02-14 DIAGNOSIS — I10 Essential (primary) hypertension: Secondary | ICD-10-CM | POA: Diagnosis not present

## 2020-02-25 DIAGNOSIS — Z23 Encounter for immunization: Secondary | ICD-10-CM | POA: Diagnosis not present

## 2020-02-25 DIAGNOSIS — M25579 Pain in unspecified ankle and joints of unspecified foot: Secondary | ICD-10-CM | POA: Diagnosis not present

## 2020-02-25 DIAGNOSIS — M79671 Pain in right foot: Secondary | ICD-10-CM | POA: Diagnosis not present

## 2020-02-25 DIAGNOSIS — J0101 Acute recurrent maxillary sinusitis: Secondary | ICD-10-CM | POA: Diagnosis not present

## 2020-02-25 DIAGNOSIS — J069 Acute upper respiratory infection, unspecified: Secondary | ICD-10-CM | POA: Diagnosis not present

## 2020-02-25 DIAGNOSIS — M79672 Pain in left foot: Secondary | ICD-10-CM | POA: Diagnosis not present

## 2020-03-06 ENCOUNTER — Other Ambulatory Visit: Payer: Self-pay | Admitting: Cardiology

## 2020-03-09 DIAGNOSIS — Z23 Encounter for immunization: Secondary | ICD-10-CM | POA: Diagnosis not present

## 2020-03-18 DIAGNOSIS — I10 Essential (primary) hypertension: Secondary | ICD-10-CM | POA: Diagnosis not present

## 2020-03-18 DIAGNOSIS — E7849 Other hyperlipidemia: Secondary | ICD-10-CM | POA: Diagnosis not present

## 2020-03-23 DIAGNOSIS — D696 Thrombocytopenia, unspecified: Secondary | ICD-10-CM | POA: Diagnosis not present

## 2020-03-23 DIAGNOSIS — N183 Chronic kidney disease, stage 3 unspecified: Secondary | ICD-10-CM | POA: Diagnosis not present

## 2020-03-23 DIAGNOSIS — K2100 Gastro-esophageal reflux disease with esophagitis, without bleeding: Secondary | ICD-10-CM | POA: Diagnosis not present

## 2020-03-23 DIAGNOSIS — M79671 Pain in right foot: Secondary | ICD-10-CM | POA: Diagnosis not present

## 2020-03-23 DIAGNOSIS — M25579 Pain in unspecified ankle and joints of unspecified foot: Secondary | ICD-10-CM | POA: Diagnosis not present

## 2020-03-23 DIAGNOSIS — M79672 Pain in left foot: Secondary | ICD-10-CM | POA: Diagnosis not present

## 2020-03-23 DIAGNOSIS — E039 Hypothyroidism, unspecified: Secondary | ICD-10-CM | POA: Diagnosis not present

## 2020-03-23 DIAGNOSIS — R7301 Impaired fasting glucose: Secondary | ICD-10-CM | POA: Diagnosis not present

## 2020-03-30 DIAGNOSIS — R600 Localized edema: Secondary | ICD-10-CM | POA: Diagnosis not present

## 2020-03-30 DIAGNOSIS — E870 Hyperosmolality and hypernatremia: Secondary | ICD-10-CM | POA: Diagnosis not present

## 2020-03-30 DIAGNOSIS — I872 Venous insufficiency (chronic) (peripheral): Secondary | ICD-10-CM | POA: Diagnosis not present

## 2020-03-30 DIAGNOSIS — E039 Hypothyroidism, unspecified: Secondary | ICD-10-CM | POA: Diagnosis not present

## 2020-03-30 DIAGNOSIS — D696 Thrombocytopenia, unspecified: Secondary | ICD-10-CM | POA: Diagnosis not present

## 2020-03-30 DIAGNOSIS — Z6825 Body mass index (BMI) 25.0-25.9, adult: Secondary | ICD-10-CM | POA: Diagnosis not present

## 2020-03-30 DIAGNOSIS — G40909 Epilepsy, unspecified, not intractable, without status epilepticus: Secondary | ICD-10-CM | POA: Diagnosis not present

## 2020-03-31 ENCOUNTER — Encounter: Payer: Self-pay | Admitting: Internal Medicine

## 2020-03-31 ENCOUNTER — Other Ambulatory Visit: Payer: Self-pay

## 2020-03-31 ENCOUNTER — Ambulatory Visit (INDEPENDENT_AMBULATORY_CARE_PROVIDER_SITE_OTHER): Payer: Medicare Other | Admitting: Internal Medicine

## 2020-03-31 VITALS — BP 115/72 | HR 74 | Temp 97.1°F | Ht 67.0 in | Wt 171.8 lb

## 2020-03-31 DIAGNOSIS — K5900 Constipation, unspecified: Secondary | ICD-10-CM | POA: Diagnosis not present

## 2020-03-31 DIAGNOSIS — K219 Gastro-esophageal reflux disease without esophagitis: Secondary | ICD-10-CM | POA: Diagnosis not present

## 2020-03-31 NOTE — Progress Notes (Signed)
Primary Care Physician:  Manon Hilding, MD Primary Gastroenterologist:  Dr. Gala Romney  Pre-Procedure History & Physical: HPI:  [name] is a 86 y.o. female here for follow-up of GERD and altered bowel function.  She continues to require rabeprazole 20 mg daily for added control of her reflux symptoms.  If she misses even a single day she has breakthrough symptoms.  She denies dysphagia.  Time struggles with bowel function.  Takes heaving teaspoon of MiraLAX in the evening.  May or may not have a bowel movement the next day typically does have a bowel movement 3 times weekly.  Does not feel she is evacuating adequately.  She does see not blood in her stool.  Past Medical History:  Diagnosis Date  . Arthritis   . Chronic diastolic heart failure ([place])   . Diverticulosis of colon (without mention of hemorrhage)    TCS by Dr. Gala Romney  . GERD (gastroesophageal reflux disease)   . Hemorrhoids, internal    TCS by Dr. Gala Romney  . Irritable bowel syndrome   . Migraine   . Mitral regurgitation   . Peripheral polyneuropathy    Confirmed by EMG and nerve conduction velocities   . Pulmonary nodule   . Seizures ([place]) 2011  . Syncope    Neurocardiogenic - positive tilt table test, on Florinef  . Tubular adenoma   . UTI (urinary tract infection)    Recurrent    Past Surgical History:  Procedure Laterality Date  . APPENDECTOMY    . Back Pain     going to chiropracator for back and hip pain  . BACK SURGERY     x2  . bladder tack    . CATARACT EXTRACTION, BILATERAL  2017  . CHOLECYSTECTOMY    . COLONOSCOPY  06/16/2005   internal hemorrhoids, L side diverticula - Dr. Gala Romney  . COLONOSCOPY N/A 05/02/2013   Dr. Gala Romney- normal rectum, scattered L sided diverticula. bx= tubular adenoma  . ESOPHAGOGASTRODUODENOSCOPY  12/21/2006     Dr. Vivi Ferns esophagus/Patulous EG junction, small to moderate sized hiatal hernia,   multiple fundal gland type appearing polyps biopsied, otherwise normal  .  FOOT SURGERY     cyst removed  . TOTAL ABDOMINAL HYSTERECTOMY    . TOTAL HIP ARTHROPLASTY Right 01/11/2017   Procedure: RIGHT TOTAL HIP ARTHROPLASTY ANTERIOR APPROACH;  Surgeon: Gaynelle Arabian, MD;  Location: [facility];  Service: Orthopedics;  Laterality: Right;  . WRIST SURGERY      Prior to Admission medications   Medication Sig Start Date End Date Taking? Authorizing Provider  Ascorbic Acid (VITAMIN C PO) Take by mouth daily.   Yes [provider]  aspirin EC 81 MG tablet Take 81 mg by mouth daily.   Yes [provider]  Bioflavonoid Products (BIOFLEX PO) Bioflex   Yes [provider]  CALCIUM PO Take by mouth daily.   Yes [provider]  Cholecalciferol (VITAMIN D) 2000 units CAPS Take by mouth daily.   Yes [provider]  Cyanocobalamin (VITAMIN B12 PO) Take by mouth daily.   Yes [provider]  diclofenac (VOLTAREN) 75 MG EC tablet Take 1 tablet by mouth 2 (two) times daily.  06/10/19 06/09/20 Yes [provider]  ESTRACE VAGINAL 0.1 MG/GM vaginal cream  05/16/17  Yes [provider]  famotidine (PEPCID) 20 MG tablet Take 20 mg by mouth as needed for heartburn or indigestion.   Yes [provider]  fludrocortisone (FLORINEF) 0.1 MG tablet TAKE 1  TABLET THREE TIMES A WEEK 03/06/20  Yes Satira Sark, MD  fluticasone [facility]) 50 MCG/ACT nasal spray Place 2 sprays into the nose as needed for allergies.    Yes [provider]  furosemide (LASIX) 20 MG tablet Take 20 mg by mouth daily.    Yes [provider]  ipratropium (ATROVENT) 0.06 % nasal spray Place 1 spray into the nose as needed. 12/06/18  Yes [provider]  Lactobacillus (PROBIOTIC ACIDOPHILUS PO) Take by mouth daily.   Yes [provider]  levothyroxine (SYNTHROID, LEVOTHROID) 75 MCG tablet Take 75 mcg by mouth daily before breakfast.   Yes [provider]  methenamine (HIPREX) 1 g tablet Take 1 g by  mouth 2 (two) times daily with a meal.   Yes [provider]  mirabegron ER (MYRBETRIQ) 50 MG TB24 tablet Take 50 mg by mouth daily.   Yes [provider]  montelukast (SINGULAIR) 10 MG tablet Take 10 mg by mouth at bedtime.    Yes [provider]  Multiple Vitamin (MULTIVITAMIN) tablet Take 1 tablet by mouth daily.   Yes [provider]  nitroGLYCERIN (NITROSTAT) 0.4 MG SL tablet Place 1 tablet (0.4 mg total) under the tongue every 5 (five) minutes x 3 doses as needed for chest pain (if no relief after 3rd dose, proceed to the ED for an evaluation). 12/28/18  Yes Satira Sark, MD  nystatin (MYCOSTATIN) 100000 UNIT/ML suspension Take 5 mLs by mouth 4 (four) times daily. 07/12/19  Yes [provider]  Polyethyl Glycol-Propyl Glycol (SYSTANE OP) Apply 1 drop to eye daily.    Yes [provider]  polyethylene glycol (MIRALAX / GLYCOLAX) packet Take 17 g by mouth daily.    Yes [provider]  polyvinyl alcohol (LIQUIFILM TEARS) 1.4 % ophthalmic solution Place 1 drop into both eyes as needed for dry eyes. 01/13/17  Yes Perkins, Alexzandrew L, PA-C  Probiotic Product (PROBIOTIC PO) Take by mouth daily.   Yes [provider]  RABEprazole (ACIPHEX) 20 MG tablet TAKE 1 TABLET TWICE A DAY 01/08/20  Yes Mahala Menghini, PA-C  topiramate (TOPAMAX) 50 MG tablet TAKE 2 TABLETS IN THE MORNING AND 3 TABLETS AT NIGHT 12/18/19  Yes Ward Givens, NP  triamcinolone cream (KENALOG) 0.1 % triamcinolone acetonide 0.1 % topical cream   Yes [provider]    Allergies as of 03/31/2020 - Review Complete 03/31/2020  Allergen Reaction Noted  . Cephalexin Anaphylaxis and Swelling 02/25/2009  . Doxazosin mesylate Other (See Comments) 04/24/2013  . Levofloxacin Anaphylaxis and Rash 02/25/2009  . Codeine Other (See Comments) 02/25/2009  . Indomethacin Other (See Comments) 02/25/2009  . Ivp dye [iodinated diagnostic agents] Hives 04/24/2013   . Morphine Other (See Comments) 02/25/2009  . Sulfamethoxazole-trimethoprim Other (See Comments)   . Penicillins Other (See Comments) 02/25/2009    Family History  Problem Relation Age of Onset  . Stroke Father   . Colon cancer Neg Hx     Social History   Socioeconomic History  . Marital status: Widowed    Spouse name: Not on file  . Number of children: 4  . Years of education: college  . Highest education level: Not on file  Occupational History    Comment: Retired  Tobacco Use  . Smoking status: Never Smoker  . Smokeless tobacco: Never Used  . Tobacco comment: tobacco use - no  Substance and Sexual Activity  . Alcohol use: No    Alcohol/week: 0.0 standard drinks  .  Drug use: No  . Sexual activity: Not Currently  Other Topics Concern  . Not on file  Social History Narrative   Retired, widowed, education college education .   Right handed.   Caffeine one cup of coffee daily.    Social Determinants of Health   Financial Resource Strain:   . Difficulty of Paying Living Expenses:   Food Insecurity:   . Worried About Charity fundraiser in the Last Year:   . Arboriculturist in the Last Year:   Transportation Needs:   . Film/video editor (Medical):   Marland Kitchen Lack of Transportation (Non-Medical):   Physical Activity:   . Days of Exercise per Week:   . Minutes of Exercise per Session:   Stress:   . Feeling of Stress :   Social Connections:   . Frequency of Communication with Friends and Family:   . Frequency of Social Gatherings with Friends and Family:   . Attends Religious Services:   . Active Member of Clubs or Organizations:   . Attends Archivist Meetings:   Marland Kitchen Marital Status:   Intimate Partner Violence:   . Fear of Current or Ex-Partner:   . Emotionally Abused:   Marland Kitchen Physically Abused:   . Sexually Abused:     Review of Systems: See HPI, otherwise negative ROS  Physical Exam: BP 115/72   Pulse 74   Temp (!) 97.1 F (36.2 C) (Oral)   Ht  5\' 7"  (1.702 m)   Wt 171 lb 12.8 oz (77.9 kg)   BMI 26.91 kg/m  General:   Frail elderly lady who ambulates with a walker.  She remains pleasant conversant and mentally sharp  Neck:  Supple; no masses or thyromegaly. No significant cervical adenopathy. Lungs:  Clear throughout to auscultation.   No wheezes, crackles, or rhonchi. No acute distress. Heart:  Regular rate and rhythm; no murmurs, clicks, rubs,  or gallops. Abdomen: Non-distended, normal bowel sounds.  Soft and nontender without appreciable mass or hepatosplenomegaly.  Pulses:  Normal pulses noted. Extremities:  Without clubbing or edema.  Impression/Plan: Pleasant 86 year old lady with longstanding GERD requires twice daily PPI therapy for adequate control.  No alarm symptoms.  She has a tendency towards constipation.  I believe that MiraLAX has efficacy we need to increase the dose to gain a better response with bowel function.  Recommendations:  GERD information provided  Constipation information provided  Continue Rabeprazole 20 mg twice daily  Increase miralax to 2 heaping teaspoons daily - take at 10 am each morning  May take other medications together first thing in the morning  Keep track of daily bowel function  Office visit in 4 months      Notice: This dictation was prepared with Dragon dictation along with smaller phrase technology. Any transcriptional errors that result from this process are unintentional and may not be corrected upon review.

## 2020-03-31 NOTE — Patient Instructions (Signed)
GERD information provided  Constipation information provided  Continue Rabeprazole 20 mg twice daily  Increase miralax to 2 heaping teaspoons daily - take at 10 am each morning  May take other medications together first thing in the morning  Keep track of your daily bowel function  Office visit in 4 months

## 2020-04-06 DIAGNOSIS — Z23 Encounter for immunization: Secondary | ICD-10-CM | POA: Diagnosis not present

## 2020-04-17 DIAGNOSIS — E7849 Other hyperlipidemia: Secondary | ICD-10-CM | POA: Diagnosis not present

## 2020-04-17 DIAGNOSIS — I10 Essential (primary) hypertension: Secondary | ICD-10-CM | POA: Diagnosis not present

## 2020-04-20 DIAGNOSIS — M2041 Other hammer toe(s) (acquired), right foot: Secondary | ICD-10-CM | POA: Diagnosis not present

## 2020-04-20 DIAGNOSIS — M79671 Pain in right foot: Secondary | ICD-10-CM | POA: Diagnosis not present

## 2020-04-20 DIAGNOSIS — M79672 Pain in left foot: Secondary | ICD-10-CM | POA: Diagnosis not present

## 2020-04-20 DIAGNOSIS — M2042 Other hammer toe(s) (acquired), left foot: Secondary | ICD-10-CM | POA: Diagnosis not present

## 2020-04-20 DIAGNOSIS — L110 Acquired keratosis follicularis: Secondary | ICD-10-CM | POA: Diagnosis not present

## 2020-04-20 DIAGNOSIS — M25579 Pain in unspecified ankle and joints of unspecified foot: Secondary | ICD-10-CM | POA: Diagnosis not present

## 2020-04-20 DIAGNOSIS — I739 Peripheral vascular disease, unspecified: Secondary | ICD-10-CM | POA: Diagnosis not present

## 2020-04-23 ENCOUNTER — Ambulatory Visit (INDEPENDENT_AMBULATORY_CARE_PROVIDER_SITE_OTHER): Payer: Medicare Other | Admitting: Adult Health

## 2020-04-23 ENCOUNTER — Other Ambulatory Visit: Payer: Self-pay

## 2020-04-23 ENCOUNTER — Encounter: Payer: Self-pay | Admitting: Adult Health

## 2020-04-23 VITALS — BP 117/71 | HR 70 | Temp 97.6°F | Ht 67.0 in | Wt 170.0 lb

## 2020-04-23 DIAGNOSIS — R519 Headache, unspecified: Secondary | ICD-10-CM | POA: Diagnosis not present

## 2020-04-23 DIAGNOSIS — G4489 Other headache syndrome: Secondary | ICD-10-CM

## 2020-04-23 NOTE — Patient Instructions (Signed)
Your Plan:  Continue Topamax MRI brain  Blood work today     Thank you for coming to see [place] at [facility]. I hope we have been able to provide you high quality care today.  You may receive a patient satisfaction survey over the next few weeks. We would appreciate your feedback and comments so that we may continue to improve ourselves and the health of our patients.

## 2020-04-23 NOTE — Progress Notes (Signed)
PATIENT: [name] DOB: Jul 16, 1930  REASON FOR VISIT: follow up HISTORY FROM: patient  HISTORY OF PRESENT ILLNESS: Today 04/23/20:  [name] is an 86 year old female with a history of daily headaches and peripheral neuropathy.  She returns today for sooner follow-up.  Reports that starting approximately 3 weeks ago she began having a headache in the right temporal region.  She states that it hurts behind the eye.  She states that night if she sleeps on the right side of her head it feels numb.  She feels that she has a weight behind the eye.  She continues on Topamax.  Unsure about any significant visual changes.  She continues to have trouble with her balance.  She uses a cane.  Reports that she has had some falls but fortunately no injuries.  She has not had any imaging of the brain since 2009.  01/22/20: HISTORY  [name] is an 86 year old female with a history of migraine headaches, seizures and peripheral neuropathy.  She returns today for follow-up.  Overall she feels that she has remained stable.  She feels that Topamax has offered her some benefit with her headache.  Although most days she feels as if she is wearing a hat.  She continues to have burning and tingling in the feet.  She reports that her podiatrist is giving her B12 injections and she feels that this has been beneficial for the burning sensations.  She continues to feel off balance.  She had a fall back in January but fortunately did not suffer any significant injuries other than bruising.  She denies any seizure events.  She returns today for follow-up.   REVIEW OF SYSTEMS: Out of a complete 14 system review of symptoms, the patient complains only of the following symptoms, and all other reviewed systems are negative.  See HPI  ALLERGIES: Allergies  Allergen Reactions  . Cephalexin Anaphylaxis and Swelling  . Doxazosin Mesylate Other (See Comments)    Made patient EXTREMELY sick.   . Levofloxacin  Anaphylaxis and Rash  . Codeine Other (See Comments)    Hallucinations  . Indomethacin Other (See Comments)    Severe Headache   . Ivp Dye [Iodinated Diagnostic Agents] Hives  . Morphine Other (See Comments)    Hallucinations   . Sulfamethoxazole-Trimethoprim Other (See Comments)    Unknown   . Penicillins Other (See Comments)    Made patient go into shock Has patient had a PCN reaction causing immediate rash, facial/tongue/throat swelling, SOB or lightheadedness with hypotension: Yes Has patient had a PCN reaction causing severe rash involving mucus membranes or skin necrosis: No Has patient had a PCN reaction that required hospitalization No Has patient had a PCN reaction occurring within the last 10 years: No If all of the above answers are "NO", then may proceed with Cephalosporin use.     HOME MEDICATIONS: Outpatient Medications Prior to Visit  Medication Sig Dispense Refill  . Ascorbic Acid (VITAMIN C PO) Take by mouth daily.    Marland Kitchen aspirin EC 81 MG tablet Take 81 mg by mouth daily.    Marland Kitchen Bioflavonoid Products (BIOFLEX PO) Bioflex    . CALCIUM PO Take by mouth daily.    . Cholecalciferol (VITAMIN D) 2000 units CAPS Take by mouth daily.    . Cyanocobalamin (VITAMIN B12 PO) Take by mouth daily.    . diclofenac (VOLTAREN) 75 MG EC tablet Take 1 tablet by mouth 2 (two) times daily.     Marland Kitchen ESTRACE  VAGINAL 0.1 MG/GM vaginal cream     . famotidine (PEPCID) 20 MG tablet Take 20 mg by mouth as needed for heartburn or indigestion.    . fludrocortisone (FLORINEF) 0.1 MG tablet TAKE 1 TABLET THREE TIMES A WEEK 36 tablet 3  . fluticasone (FLONASE) 50 MCG/ACT nasal spray Place 2 sprays into the nose as needed for allergies.     . furosemide (LASIX) 20 MG tablet Take 20 mg by mouth daily.     Marland Kitchen ipratropium (ATROVENT) 0.06 % nasal spray Place 1 spray into the nose as needed.    . Lactobacillus (PROBIOTIC ACIDOPHILUS PO) Take by mouth daily.    Marland Kitchen levothyroxine (SYNTHROID, LEVOTHROID) 75 MCG  tablet Take 75 mcg by mouth daily before breakfast.    . methenamine (HIPREX) 1 g tablet Take 1 g by mouth 2 (two) times daily with a meal.    . mirabegron ER (MYRBETRIQ) 50 MG TB24 tablet Take 50 mg by mouth daily.    . montelukast (SINGULAIR) 10 MG tablet Take 10 mg by mouth at bedtime.     . Multiple Vitamin (MULTIVITAMIN) tablet Take 1 tablet by mouth daily.    . nitroGLYCERIN (NITROSTAT) 0.4 MG SL tablet Place 1 tablet (0.4 mg total) under the tongue every 5 (five) minutes x 3 doses as needed for chest pain (if no relief after 3rd dose, proceed to the ED for an evaluation). 75 tablet 0  . nystatin (MYCOSTATIN) 100000 UNIT/ML suspension Take 5 mLs by mouth 4 (four) times daily.    Vladimir Faster Glycol-Propyl Glycol (SYSTANE OP) Apply 1 drop to eye daily.     . polyethylene glycol (MIRALAX / GLYCOLAX) packet Take 17 g by mouth daily.     . polyvinyl alcohol (LIQUIFILM TEARS) 1.4 % ophthalmic solution Place 1 drop into both eyes as needed for dry eyes. 15 mL 0  . Probiotic Product (PROBIOTIC PO) Take by mouth daily.    . RABEprazole (ACIPHEX) 20 MG tablet TAKE 1 TABLET TWICE A DAY 180 tablet 3  . topiramate (TOPAMAX) 50 MG tablet TAKE 2 TABLETS IN THE MORNING AND 3 TABLETS AT NIGHT 450 tablet 3  . triamcinolone cream (KENALOG) 0.1 % triamcinolone acetonide 0.1 % topical cream     No facility-administered medications prior to visit.    PAST MEDICAL HISTORY: Past Medical History:  Diagnosis Date  . Arthritis   . Chronic diastolic heart failure ([place])   . Diverticulosis of colon (without mention of hemorrhage)    TCS by Dr. Gala Romney  . GERD (gastroesophageal reflux disease)   . Hemorrhoids, internal    TCS by Dr. Gala Romney  . Irritable bowel syndrome   . Migraine   . Mitral regurgitation   . Peripheral polyneuropathy    Confirmed by EMG and nerve conduction velocities   . Pulmonary nodule   . Seizures ([place]) 2011  . Syncope    Neurocardiogenic - positive tilt table test, on Florinef  .  Tubular adenoma   . UTI (urinary tract infection)    Recurrent    PAST SURGICAL HISTORY: Past Surgical History:  Procedure Laterality Date  . APPENDECTOMY    . Back Pain     going to chiropracator for back and hip pain  . BACK SURGERY     x2  . bladder tack    . CATARACT EXTRACTION, BILATERAL  2017  . CHOLECYSTECTOMY    . COLONOSCOPY  06/16/2005   internal hemorrhoids, L side diverticula - Dr. Gala Romney  . COLONOSCOPY N/A  05/02/2013   Dr. Gala Romney- normal rectum, scattered L sided diverticula. bx= tubular adenoma  . ESOPHAGOGASTRODUODENOSCOPY  12/21/2006     Dr. Vivi Ferns esophagus/Patulous EG junction, small to moderate sized hiatal hernia,   multiple fundal gland type appearing polyps biopsied, otherwise normal  . FOOT SURGERY     cyst removed  . TOTAL ABDOMINAL HYSTERECTOMY    . TOTAL HIP ARTHROPLASTY Right 01/11/2017   Procedure: RIGHT TOTAL HIP ARTHROPLASTY ANTERIOR APPROACH;  Surgeon: Gaynelle Arabian, MD;  Location: [facility];  Service: Orthopedics;  Laterality: Right;  . WRIST SURGERY      FAMILY HISTORY: Family History  Problem Relation Age of Onset  . Stroke Father   . Colon cancer Neg Hx     SOCIAL HISTORY: Social History   Socioeconomic History  . Marital status: Widowed    Spouse name: Not on file  . Number of children: 4  . Years of education: college  . Highest education level: Not on file  Occupational History    Comment: Retired  Tobacco Use  . Smoking status: Never Smoker  . Smokeless tobacco: Never Used  . Tobacco comment: tobacco use - no  Substance and Sexual Activity  . Alcohol use: No    Alcohol/week: 0.0 standard drinks  . Drug use: No  . Sexual activity: Not Currently  Other Topics Concern  . Not on file  Social History Narrative   Retired, widowed, education college education .   Right handed.   Caffeine one cup of coffee daily.    Social Determinants of Health   Financial Resource Strain:   . Difficulty of Paying Living Expenses:     Food Insecurity:   . Worried About Charity fundraiser in the Last Year:   . Arboriculturist in the Last Year:   Transportation Needs:   . Film/video editor (Medical):   Marland Kitchen Lack of Transportation (Non-Medical):   Physical Activity:   . Days of Exercise per Week:   . Minutes of Exercise per Session:   Stress:   . Feeling of Stress :   Social Connections:   . Frequency of Communication with Friends and Family:   . Frequency of Social Gatherings with Friends and Family:   . Attends Religious Services:   . Active Member of Clubs or Organizations:   . Attends Archivist Meetings:   Marland Kitchen Marital Status:   Intimate Partner Violence:   . Fear of Current or Ex-Partner:   . Emotionally Abused:   Marland Kitchen Physically Abused:   . Sexually Abused:       PHYSICAL EXAM  Vitals:   04/23/20 1327  BP: 117/71  Pulse: 70  Temp: 97.6 F (36.4 C)  Weight: 170 lb (77.1 kg)  Height: 5\' 7"  (1.702 m)   Body mass index is 26.63 kg/m.  Generalized: Well developed, in no acute distress   Neurological examination  Mentation: Alert oriented to time, place, history taking. Follows all commands speech and language fluent Cranial nerve II-XII: Pupils were equal round reactive to light. Extraocular movements were full, visual field were full on confrontational test. Head turning and shoulder shrug  were normal and symmetric. Motor: The motor testing reveals 5 over 5 strength of all 4 extremities. Good symmetric motor tone is noted throughout.  Sensory: Sensory testing is intact to soft touch on all 4 extremities. No evidence of extinction is noted.  Coordination: Cerebellar testing reveals good finger-nose-finger and heel-to-shin bilaterally.  Gait and station: Gait is unsteady.  She uses a cane when ambulating. Reflexes: Deep tendon reflexes are symmetric and normal bilaterally.   DIAGNOSTIC DATA (LABS, IMAGING, TESTING) - I reviewed patient records, labs, notes, testing and imaging myself  where available.  Lab Results  Component Value Date   WBC 4.4 08/16/2018   HGB 12.8 08/16/2018   HCT 38.7 08/16/2018   MCV 82.0 08/16/2018   PLT 136 (L) 08/16/2018      Component Value Date/Time   NA 143 08/16/2018 1153   K 4.0 08/16/2018 1153   CL 109 08/16/2018 1153   CO2 27 08/16/2018 1153   GLUCOSE 86 08/16/2018 1153   BUN 12 08/16/2018 1153   CREATININE 0.82 08/16/2018 1153   CALCIUM 9.4 08/16/2018 1153   PROT 6.3 08/16/2018 1153   ALBUMIN 3.9 01/06/2017 1518   AST 21 08/16/2018 1153   ALT 12 08/16/2018 1153   ALKPHOS 57 01/06/2017 1518   BILITOT 0.6 08/16/2018 1153   GFRNONAA 65 08/16/2018 1153   GFRAA 75 08/16/2018 1153   No results found for: CHOL, HDL, LDLCALC, LDLDIRECT, TRIG, CHOLHDL No results found for: HGBA1C No results found for: VITAMINB12 Lab Results  Component Value Date   TSH 1.73 08/16/2018      ASSESSMENT AND PLAN 86 y.o. year old female  has a past medical history of Arthritis, Chronic diastolic heart failure ([place]), Diverticulosis of colon (without mention of hemorrhage), GERD (gastroesophageal reflux disease), Hemorrhoids, internal, Irritable bowel syndrome, Migraine, Mitral regurgitation, Peripheral polyneuropathy, Pulmonary nodule, Seizures ([place]) (2011), Syncope, Tubular adenoma, and UTI (urinary tract infection). here with:  1.  Chronic daily headache. 2.  New headache in the right temporal region with numbness and tingling  -MRI of the brain ordered -Blood work including CBC, CMP, CRP and sed rate to rule out possible temporal arteritis although this seems less likely. -Continue Topamax   I spent 25 minutes of face-to-face and non-face-to-face time with patient.  This included previsit chart review, lab review, study review, order entry, electronic health record documentation, patient education.  Ward Givens, MSN, NP-C 04/23/2020, 4:27 PM [facility] [address], [place],  [address] [phone]

## 2020-04-24 LAB — CBC WITH DIFFERENTIAL/PLATELET
Basophils Absolute: 0 10*3/uL (ref 0.0–0.2)
Basos: 1 %
EOS (ABSOLUTE): 0.3 10*3/uL (ref 0.0–0.4)
Eos: 8 %
Hematocrit: 35.1 % (ref 34.0–46.6)
Hemoglobin: 11.2 g/dL (ref 11.1–15.9)
Immature Grans (Abs): 0 10*3/uL (ref 0.0–0.1)
Immature Granulocytes: 0 %
Lymphocytes Absolute: 0.8 10*3/uL (ref 0.7–3.1)
Lymphs: 25 %
MCH: 27.3 pg (ref 26.6–33.0)
MCHC: 31.9 g/dL (ref 31.5–35.7)
MCV: 86 fL (ref 79–97)
Monocytes Absolute: 0.4 10*3/uL (ref 0.1–0.9)
Monocytes: 13 %
Neutrophils Absolute: 1.8 10*3/uL (ref 1.4–7.0)
Neutrophils: 53 %
Platelets: 138 10*3/uL — ABNORMAL LOW (ref 150–450)
RBC: 4.1 x10E6/uL (ref 3.77–5.28)
RDW: 14 % (ref 11.7–15.4)
WBC: 3.4 10*3/uL (ref 3.4–10.8)

## 2020-04-24 LAB — COMPREHENSIVE METABOLIC PANEL
ALT: 14 IU/L (ref 0–32)
AST: 24 IU/L (ref 0–40)
Albumin/Globulin Ratio: 2.1 (ref 1.2–2.2)
Albumin: 3.7 g/dL (ref 3.6–4.6)
Alkaline Phosphatase: 89 IU/L (ref 39–117)
BUN/Creatinine Ratio: 19 (ref 12–28)
BUN: 17 mg/dL (ref 8–27)
Bilirubin Total: 0.4 mg/dL (ref 0.0–1.2)
CO2: 27 mmol/L (ref 20–29)
Calcium: 10 mg/dL (ref 8.7–10.3)
Chloride: 108 mmol/L — ABNORMAL HIGH (ref 96–106)
Creatinine, Ser: 0.9 mg/dL (ref 0.57–1.00)
GFR calc Af Amer: 66 mL/min/{1.73_m2} (ref >59)
GFR calc non Af Amer: 57 mL/min/{1.73_m2} — ABNORMAL LOW (ref >59)
Globulin, Total: 1.8 g/dL (ref 1.5–4.5)
Glucose: 87 mg/dL (ref 65–99)
Potassium: 3.6 mmol/L (ref 3.5–5.2)
Sodium: 147 mmol/L — ABNORMAL HIGH (ref 134–144)
Total Protein: 5.5 g/dL — ABNORMAL LOW (ref 6.0–8.5)

## 2020-04-24 LAB — SEDIMENTATION RATE: Sed Rate: 2 mm/hr (ref 0–40)

## 2020-04-24 LAB — C-REACTIVE PROTEIN: CRP: 3 mg/L (ref 0–10)

## 2020-04-24 NOTE — Progress Notes (Signed)
I agree with the above plan 

## 2020-04-27 ENCOUNTER — Telehealth: Payer: Self-pay | Admitting: *Deleted

## 2020-04-27 DIAGNOSIS — M79671 Pain in right foot: Secondary | ICD-10-CM | POA: Diagnosis not present

## 2020-04-27 DIAGNOSIS — M79672 Pain in left foot: Secondary | ICD-10-CM | POA: Diagnosis not present

## 2020-04-27 DIAGNOSIS — Z85828 Personal history of other malignant neoplasm of skin: Secondary | ICD-10-CM | POA: Diagnosis not present

## 2020-04-27 DIAGNOSIS — M25579 Pain in unspecified ankle and joints of unspecified foot: Secondary | ICD-10-CM | POA: Diagnosis not present

## 2020-04-27 DIAGNOSIS — L853 Xerosis cutis: Secondary | ICD-10-CM | POA: Diagnosis not present

## 2020-04-27 DIAGNOSIS — L570 Actinic keratosis: Secondary | ICD-10-CM | POA: Diagnosis not present

## 2020-04-27 NOTE — Telephone Encounter (Signed)
Spoke with pt and discussed labs per [place]. Pt verbalized understanding and appreciation. Her questions were answered. Pt stated to cancel the August appt since she was seen early. Her next f/u will be in November as per most recent recommendations from office visit.

## 2020-04-28 DIAGNOSIS — H3540 Unspecified peripheral retinal degeneration: Secondary | ICD-10-CM | POA: Diagnosis not present

## 2020-04-30 DIAGNOSIS — N3941 Urge incontinence: Secondary | ICD-10-CM | POA: Diagnosis not present

## 2020-04-30 DIAGNOSIS — R3914 Feeling of incomplete bladder emptying: Secondary | ICD-10-CM | POA: Diagnosis not present

## 2020-04-30 DIAGNOSIS — R350 Frequency of micturition: Secondary | ICD-10-CM | POA: Diagnosis not present

## 2020-04-30 DIAGNOSIS — R3915 Urgency of urination: Secondary | ICD-10-CM | POA: Diagnosis not present

## 2020-04-30 DIAGNOSIS — N390 Urinary tract infection, site not specified: Secondary | ICD-10-CM | POA: Diagnosis not present

## 2020-04-30 DIAGNOSIS — N952 Postmenopausal atrophic vaginitis: Secondary | ICD-10-CM | POA: Diagnosis not present

## 2020-05-01 DIAGNOSIS — M25551 Pain in right hip: Secondary | ICD-10-CM | POA: Diagnosis not present

## 2020-05-04 ENCOUNTER — Telehealth: Payer: Self-pay | Admitting: Adult Health

## 2020-05-04 NOTE — Telephone Encounter (Signed)
I called [facility]. The patient is scheduled for 05/20/20 at 8:30 AM. They informed me the patient is aware.

## 2020-05-04 NOTE — Telephone Encounter (Signed)
Medicare/Tricare no auth. Order faxed to [place]: Melissa she will schedule the MRI.

## 2020-05-07 DIAGNOSIS — Z96641 Presence of right artificial hip joint: Secondary | ICD-10-CM | POA: Diagnosis not present

## 2020-05-07 DIAGNOSIS — M25551 Pain in right hip: Secondary | ICD-10-CM | POA: Diagnosis not present

## 2020-05-19 DIAGNOSIS — H16223 Keratoconjunctivitis sicca, not specified as Sjogren's, bilateral: Secondary | ICD-10-CM | POA: Diagnosis not present

## 2020-05-19 DIAGNOSIS — H04123 Dry eye syndrome of bilateral lacrimal glands: Secondary | ICD-10-CM | POA: Diagnosis not present

## 2020-05-19 DIAGNOSIS — H26493 Other secondary cataract, bilateral: Secondary | ICD-10-CM | POA: Diagnosis not present

## 2020-05-19 DIAGNOSIS — H40013 Open angle with borderline findings, low risk, bilateral: Secondary | ICD-10-CM | POA: Diagnosis not present

## 2020-05-19 DIAGNOSIS — R519 Headache, unspecified: Secondary | ICD-10-CM | POA: Diagnosis not present

## 2020-05-19 DIAGNOSIS — H3540 Unspecified peripheral retinal degeneration: Secondary | ICD-10-CM | POA: Diagnosis not present

## 2020-05-19 DIAGNOSIS — H3589 Other specified retinal disorders: Secondary | ICD-10-CM | POA: Diagnosis not present

## 2020-05-19 DIAGNOSIS — H43811 Vitreous degeneration, right eye: Secondary | ICD-10-CM | POA: Diagnosis not present

## 2020-05-19 DIAGNOSIS — Z961 Presence of intraocular lens: Secondary | ICD-10-CM | POA: Diagnosis not present

## 2020-05-20 DIAGNOSIS — R519 Headache, unspecified: Secondary | ICD-10-CM | POA: Diagnosis not present

## 2020-05-25 DIAGNOSIS — M79672 Pain in left foot: Secondary | ICD-10-CM | POA: Diagnosis not present

## 2020-05-25 DIAGNOSIS — M79671 Pain in right foot: Secondary | ICD-10-CM | POA: Diagnosis not present

## 2020-05-25 DIAGNOSIS — M25579 Pain in unspecified ankle and joints of unspecified foot: Secondary | ICD-10-CM | POA: Diagnosis not present

## 2020-06-03 ENCOUNTER — Encounter: Payer: Self-pay | Admitting: Internal Medicine

## 2020-06-10 ENCOUNTER — Telehealth: Payer: Self-pay | Admitting: Adult Health

## 2020-06-10 NOTE — Telephone Encounter (Signed)
Patient had MRI of the brain at [place].    Impression:  No acute intracranial finding.  Age-related atrophy.  Moderate chronic small vessel ischemic changes of the pons and cerebral hemisphere white matter.  Opacification of the right division of the sphenoid sinus.  This can be associated with headache.  This is a chronic finding and was seen in 2018.   Please inquire with patient how she is doing with her headaches.  May need to refer to ENT due to opacification of the right division of the sphenoid sinus

## 2020-06-10 NOTE — Telephone Encounter (Signed)
I called the pt and discussed MRI results & question from [facility]. Pt verbalized understanding of results and her questions were answered. She said she continues to have HA. Feels like she has a hat on the top of her head. She said its aggravating but isn't severe. Pt reports feeling off balance (noted in previous ov by MM), gets dizzy when she looks up or down. Pt states has been going on for a long time. Last weekend she was in closet reaching for shoes and fell into the clothes. Wrist twisted, sore, bruised arm, did not hit head. I advised pt to call PCP, ortho, or see urgent care for wrist. She has h/o wrist fracture and surgeries. The call unfortunately air dropped before we finished conversation. I called pt back several times but received busy signal. Appears the phones are down right now.

## 2020-06-11 NOTE — Telephone Encounter (Signed)
I called pt & LVM asking for call back if she had further information to provide from our phone call yesterday. Advised we have been having trouble with phones. She can send a mychart message if needed.

## 2020-06-11 NOTE — Telephone Encounter (Signed)
ENT referral not recommend at this time. If pt calls back we can offer vestibular rehab.

## 2020-06-30 DIAGNOSIS — R5383 Other fatigue: Secondary | ICD-10-CM | POA: Diagnosis not present

## 2020-06-30 DIAGNOSIS — R1013 Epigastric pain: Secondary | ICD-10-CM | POA: Diagnosis not present

## 2020-06-30 DIAGNOSIS — R5382 Chronic fatigue, unspecified: Secondary | ICD-10-CM | POA: Diagnosis not present

## 2020-06-30 DIAGNOSIS — R682 Dry mouth, unspecified: Secondary | ICD-10-CM | POA: Diagnosis not present

## 2020-06-30 DIAGNOSIS — E039 Hypothyroidism, unspecified: Secondary | ICD-10-CM | POA: Diagnosis not present

## 2020-06-30 DIAGNOSIS — E559 Vitamin D deficiency, unspecified: Secondary | ICD-10-CM | POA: Diagnosis not present

## 2020-06-30 DIAGNOSIS — M791 Myalgia, unspecified site: Secondary | ICD-10-CM | POA: Diagnosis not present

## 2020-07-06 DIAGNOSIS — I739 Peripheral vascular disease, unspecified: Secondary | ICD-10-CM | POA: Diagnosis not present

## 2020-07-06 DIAGNOSIS — M2042 Other hammer toe(s) (acquired), left foot: Secondary | ICD-10-CM | POA: Diagnosis not present

## 2020-07-06 DIAGNOSIS — M79672 Pain in left foot: Secondary | ICD-10-CM | POA: Diagnosis not present

## 2020-07-06 DIAGNOSIS — L110 Acquired keratosis follicularis: Secondary | ICD-10-CM | POA: Diagnosis not present

## 2020-07-06 DIAGNOSIS — M25579 Pain in unspecified ankle and joints of unspecified foot: Secondary | ICD-10-CM | POA: Diagnosis not present

## 2020-07-06 DIAGNOSIS — M79671 Pain in right foot: Secondary | ICD-10-CM | POA: Diagnosis not present

## 2020-07-06 DIAGNOSIS — M2041 Other hammer toe(s) (acquired), right foot: Secondary | ICD-10-CM | POA: Diagnosis not present

## 2020-07-14 DIAGNOSIS — H40012 Open angle with borderline findings, low risk, left eye: Secondary | ICD-10-CM | POA: Diagnosis not present

## 2020-07-20 DIAGNOSIS — M79672 Pain in left foot: Secondary | ICD-10-CM | POA: Diagnosis not present

## 2020-07-20 DIAGNOSIS — M25579 Pain in unspecified ankle and joints of unspecified foot: Secondary | ICD-10-CM | POA: Diagnosis not present

## 2020-07-20 DIAGNOSIS — M79671 Pain in right foot: Secondary | ICD-10-CM | POA: Diagnosis not present

## 2020-07-21 DIAGNOSIS — R05 Cough: Secondary | ICD-10-CM | POA: Diagnosis not present

## 2020-07-21 DIAGNOSIS — R600 Localized edema: Secondary | ICD-10-CM | POA: Diagnosis not present

## 2020-07-21 DIAGNOSIS — R609 Edema, unspecified: Secondary | ICD-10-CM | POA: Diagnosis not present

## 2020-07-28 ENCOUNTER — Ambulatory Visit: Payer: Medicare Other | Admitting: Adult Health

## 2020-07-28 DIAGNOSIS — H02834 Dermatochalasis of left upper eyelid: Secondary | ICD-10-CM | POA: Diagnosis not present

## 2020-07-28 DIAGNOSIS — H04123 Dry eye syndrome of bilateral lacrimal glands: Secondary | ICD-10-CM | POA: Diagnosis not present

## 2020-07-28 DIAGNOSIS — H40013 Open angle with borderline findings, low risk, bilateral: Secondary | ICD-10-CM | POA: Diagnosis not present

## 2020-07-28 DIAGNOSIS — H02831 Dermatochalasis of right upper eyelid: Secondary | ICD-10-CM | POA: Diagnosis not present

## 2020-07-28 DIAGNOSIS — H26493 Other secondary cataract, bilateral: Secondary | ICD-10-CM | POA: Diagnosis not present

## 2020-07-28 DIAGNOSIS — H18513 Endothelial corneal dystrophy, bilateral: Secondary | ICD-10-CM | POA: Diagnosis not present

## 2020-07-30 DIAGNOSIS — I5030 Unspecified diastolic (congestive) heart failure: Secondary | ICD-10-CM | POA: Diagnosis not present

## 2020-07-30 DIAGNOSIS — I951 Orthostatic hypotension: Secondary | ICD-10-CM | POA: Diagnosis not present

## 2020-07-30 DIAGNOSIS — R682 Dry mouth, unspecified: Secondary | ICD-10-CM | POA: Diagnosis not present

## 2020-07-30 DIAGNOSIS — E870 Hyperosmolality and hypernatremia: Secondary | ICD-10-CM | POA: Diagnosis not present

## 2020-07-30 DIAGNOSIS — R7301 Impaired fasting glucose: Secondary | ICD-10-CM | POA: Diagnosis not present

## 2020-07-30 DIAGNOSIS — R5383 Other fatigue: Secondary | ICD-10-CM | POA: Diagnosis not present

## 2020-07-30 DIAGNOSIS — K2100 Gastro-esophageal reflux disease with esophagitis, without bleeding: Secondary | ICD-10-CM | POA: Diagnosis not present

## 2020-07-30 DIAGNOSIS — N183 Chronic kidney disease, stage 3 unspecified: Secondary | ICD-10-CM | POA: Diagnosis not present

## 2020-07-30 DIAGNOSIS — G40909 Epilepsy, unspecified, not intractable, without status epilepticus: Secondary | ICD-10-CM | POA: Diagnosis not present

## 2020-07-30 DIAGNOSIS — E039 Hypothyroidism, unspecified: Secondary | ICD-10-CM | POA: Diagnosis not present

## 2020-08-03 DIAGNOSIS — G40909 Epilepsy, unspecified, not intractable, without status epilepticus: Secondary | ICD-10-CM | POA: Diagnosis not present

## 2020-08-03 DIAGNOSIS — Z1331 Encounter for screening for depression: Secondary | ICD-10-CM | POA: Diagnosis not present

## 2020-08-03 DIAGNOSIS — E870 Hyperosmolality and hypernatremia: Secondary | ICD-10-CM | POA: Diagnosis not present

## 2020-08-03 DIAGNOSIS — I872 Venous insufficiency (chronic) (peripheral): Secondary | ICD-10-CM | POA: Diagnosis not present

## 2020-08-03 DIAGNOSIS — D696 Thrombocytopenia, unspecified: Secondary | ICD-10-CM | POA: Diagnosis not present

## 2020-08-03 DIAGNOSIS — D638 Anemia in other chronic diseases classified elsewhere: Secondary | ICD-10-CM | POA: Diagnosis not present

## 2020-08-03 DIAGNOSIS — Z6825 Body mass index (BMI) 25.0-25.9, adult: Secondary | ICD-10-CM | POA: Diagnosis not present

## 2020-08-03 DIAGNOSIS — Z1389 Encounter for screening for other disorder: Secondary | ICD-10-CM | POA: Diagnosis not present

## 2020-08-12 DIAGNOSIS — S66812A Strain of other specified muscles, fascia and tendons at wrist and hand level, left hand, initial encounter: Secondary | ICD-10-CM | POA: Diagnosis not present

## 2020-08-12 DIAGNOSIS — R52 Pain, unspecified: Secondary | ICD-10-CM | POA: Diagnosis not present

## 2020-08-12 DIAGNOSIS — M180 Bilateral primary osteoarthritis of first carpometacarpal joints: Secondary | ICD-10-CM | POA: Diagnosis not present

## 2020-08-17 DIAGNOSIS — M25579 Pain in unspecified ankle and joints of unspecified foot: Secondary | ICD-10-CM | POA: Diagnosis not present

## 2020-08-17 DIAGNOSIS — M79671 Pain in right foot: Secondary | ICD-10-CM | POA: Diagnosis not present

## 2020-08-17 DIAGNOSIS — M79672 Pain in left foot: Secondary | ICD-10-CM | POA: Diagnosis not present

## 2020-08-18 DIAGNOSIS — G40909 Epilepsy, unspecified, not intractable, without status epilepticus: Secondary | ICD-10-CM | POA: Diagnosis not present

## 2020-08-18 DIAGNOSIS — I5030 Unspecified diastolic (congestive) heart failure: Secondary | ICD-10-CM | POA: Diagnosis not present

## 2020-08-18 DIAGNOSIS — N183 Chronic kidney disease, stage 3 unspecified: Secondary | ICD-10-CM | POA: Diagnosis not present

## 2020-08-18 DIAGNOSIS — I130 Hypertensive heart and chronic kidney disease with heart failure and stage 1 through stage 4 chronic kidney disease, or unspecified chronic kidney disease: Secondary | ICD-10-CM | POA: Diagnosis not present

## 2020-08-19 DIAGNOSIS — H26493 Other secondary cataract, bilateral: Secondary | ICD-10-CM | POA: Diagnosis not present

## 2020-08-19 DIAGNOSIS — H18513 Endothelial corneal dystrophy, bilateral: Secondary | ICD-10-CM | POA: Diagnosis not present

## 2020-08-19 DIAGNOSIS — H04123 Dry eye syndrome of bilateral lacrimal glands: Secondary | ICD-10-CM | POA: Diagnosis not present

## 2020-08-19 DIAGNOSIS — H26491 Other secondary cataract, right eye: Secondary | ICD-10-CM | POA: Diagnosis not present

## 2020-08-19 DIAGNOSIS — H40013 Open angle with borderline findings, low risk, bilateral: Secondary | ICD-10-CM | POA: Diagnosis not present

## 2020-09-02 DIAGNOSIS — R609 Edema, unspecified: Secondary | ICD-10-CM | POA: Diagnosis not present

## 2020-09-02 DIAGNOSIS — Z6825 Body mass index (BMI) 25.0-25.9, adult: Secondary | ICD-10-CM | POA: Diagnosis not present

## 2020-09-02 DIAGNOSIS — R21 Rash and other nonspecific skin eruption: Secondary | ICD-10-CM | POA: Diagnosis not present

## 2020-09-02 DIAGNOSIS — R519 Headache, unspecified: Secondary | ICD-10-CM | POA: Diagnosis not present

## 2020-09-02 DIAGNOSIS — R42 Dizziness and giddiness: Secondary | ICD-10-CM | POA: Diagnosis not present

## 2020-09-02 DIAGNOSIS — R2689 Other abnormalities of gait and mobility: Secondary | ICD-10-CM | POA: Diagnosis not present

## 2020-09-08 ENCOUNTER — Encounter: Payer: Self-pay | Admitting: Internal Medicine

## 2020-09-08 ENCOUNTER — Other Ambulatory Visit: Payer: Self-pay | Admitting: Internal Medicine

## 2020-09-08 ENCOUNTER — Ambulatory Visit (INDEPENDENT_AMBULATORY_CARE_PROVIDER_SITE_OTHER): Payer: Medicare Other | Admitting: Internal Medicine

## 2020-09-08 ENCOUNTER — Other Ambulatory Visit: Payer: Self-pay

## 2020-09-08 VITALS — BP 122/70 | HR 64 | Temp 97.8°F | Ht 68.0 in | Wt 166.8 lb

## 2020-09-08 DIAGNOSIS — K219 Gastro-esophageal reflux disease without esophagitis: Secondary | ICD-10-CM | POA: Diagnosis not present

## 2020-09-08 DIAGNOSIS — K5909 Other constipation: Secondary | ICD-10-CM | POA: Diagnosis not present

## 2020-09-08 MED ORDER — FAMOTIDINE 40 MG PO TABS
40.0000 mg | ORAL_TABLET | Freq: Every day | ORAL | 11 refills | Status: DC | PRN
Start: 2020-09-08 — End: 2021-07-20

## 2020-09-08 NOTE — Patient Instructions (Signed)
Continue Aciphex 20 mg twice daily -  May take Famotidine 40 mg in the evening if needed on top of Aciphex as needed(Rx for Famotidine 40 mg tablet - disp 30 -with 11 refills)  Decrease Miralax to 1 1/2 teaspoon each morning  Call me in 2 weeks and let me know how things are going  OV here in 3 months

## 2020-09-08 NOTE — Progress Notes (Addendum)
Primary Care Physician:  Manon Hilding, MD Primary Gastroenterologist:  Dr. Gala Romney  Pre-Procedure History & Physical: HPI:  [name] is a 86 y.o. female here for follow-up of GERD and constipation.  We increased her MiraLAX to 2 teaspoons each morning at last visit.  She states this helps her achieve a bowel movement daily but every "once in a while" she will have some incontinence..  Has to visit the bathroom frequently while on diuretics for heart failure.  No melena or bleeding.  Does have occasional flare and reflux in spite of taking AcipHex 40 mg twice daily.  No dysphagia.  She tells me she has been falling quite a bit; she has peripheral neuropathy and history of neurocardiogenic syncope but has not had any syncope or seizure-like activity.  Abulates with a walker.  She continues to live alone.  Past Medical History:  Diagnosis Date  . Arthritis   . Chronic diastolic heart failure ([place])   . Diverticulosis of colon (without mention of hemorrhage)    TCS by Dr. Gala Romney  . GERD (gastroesophageal reflux disease)   . Hemorrhoids, internal    TCS by Dr. Gala Romney  . Irritable bowel syndrome   . Migraine   . Mitral regurgitation   . Peripheral polyneuropathy    Confirmed by EMG and nerve conduction velocities   . Pulmonary nodule   . Seizures ([place]) 2011  . Syncope    Neurocardiogenic - positive tilt table test, on Florinef  . Tubular adenoma   . UTI (urinary tract infection)    Recurrent    Past Surgical History:  Procedure Laterality Date  . APPENDECTOMY    . Back Pain     going to chiropracator for back and hip pain  . BACK SURGERY     x2  . bladder tack    . CATARACT EXTRACTION, BILATERAL  2017  . CHOLECYSTECTOMY    . COLONOSCOPY  06/16/2005   internal hemorrhoids, L side diverticula - Dr. Gala Romney  . COLONOSCOPY N/A 05/02/2013   Dr. Gala Romney- normal rectum, scattered L sided diverticula. bx= tubular adenoma  . ESOPHAGOGASTRODUODENOSCOPY  12/21/2006     Dr.  Vivi Ferns esophagus/Patulous EG junction, small to moderate sized hiatal hernia,   multiple fundal gland type appearing polyps biopsied, otherwise normal  . FOOT SURGERY     cyst removed  . TOTAL ABDOMINAL HYSTERECTOMY    . TOTAL HIP ARTHROPLASTY Right 01/11/2017   Procedure: RIGHT TOTAL HIP ARTHROPLASTY ANTERIOR APPROACH;  Surgeon: Gaynelle Arabian, MD;  Location: [facility];  Service: Orthopedics;  Laterality: Right;  . WRIST SURGERY      Prior to Admission medications   Medication Sig Start Date End Date Taking? Authorizing Provider  Ascorbic Acid (VITA-C PO) Take by mouth daily.   Yes [provider]  Ascorbic Acid (VITAMIN C PO) Take by mouth daily.   Yes [provider]  aspirin EC 81 MG tablet Take 81 mg by mouth daily.   Yes [provider]  CALCIUM PO Take by mouth daily.   Yes [provider]  cetirizine (ZYRTEC) 10 MG tablet Take 10 mg by mouth daily.   Yes [provider]  Cholecalciferol (VITAMIN D) 2000 units CAPS Take by mouth daily.   Yes [provider]  ESTRACE VAGINAL 0.1 MG/GM vaginal cream  05/16/17  Yes [provider]  fludrocortisone (FLORINEF) 0.1 MG tablet TAKE 1 TABLET THREE TIMES A WEEK 03/06/20  Yes Satira Sark, MD  fluticasone [facility])  50 MCG/ACT nasal spray Place 2 sprays into the nose as needed for allergies.    Yes [provider]  furosemide (LASIX) 20 MG tablet Take 40 mg by mouth daily.    Yes [provider]  ipratropium (ATROVENT) 0.06 % nasal spray Place 1 spray into the nose as needed. 12/06/18  Yes [provider]  Lactobacillus (PROBIOTIC ACIDOPHILUS PO) Take by mouth daily.   Yes [provider]  levothyroxine (SYNTHROID, LEVOTHROID) 75 MCG tablet Take 75 mcg by mouth daily before breakfast.   Yes [provider]  MELOXICAM PO Take by mouth daily.   Yes [provider]  methenamine (HIPREX) 1 g tablet Take 1 g by mouth 2 (two) times  daily with a meal.   Yes [provider]  montelukast (SINGULAIR) 10 MG tablet Take 10 mg by mouth at bedtime.    Yes [provider]  Multiple Vitamin (MULTIVITAMIN) tablet Take 1 tablet by mouth daily.   Yes [provider]  nitroGLYCERIN (NITROSTAT) 0.4 MG SL tablet Place 1 tablet (0.4 mg total) under the tongue every 5 (five) minutes x 3 doses as needed for chest pain (if no relief after 3rd dose, proceed to the ED for an evaluation). 12/28/18  Yes Satira Sark, MD  Omega-3 Fatty Acids (FISH OIL) 1000 MG CAPS Take by mouth.   Yes [provider]  Polyethyl Glycol-Propyl Glycol (SYSTANE OP) Apply 1 drop to eye daily.    Yes [provider]  polyethylene glycol (MIRALAX / GLYCOLAX) packet Take 17 g by mouth daily.    Yes [provider]  polyvinyl alcohol (LIQUIFILM TEARS) 1.4 % ophthalmic solution Place 1 drop into both eyes as needed for dry eyes. 01/13/17  Yes Perkins, Alexzandrew L, PA-C  Probiotic Product (PROBIOTIC PO) Take by mouth daily.   Yes [provider]  RABEprazole (ACIPHEX) 20 MG tablet TAKE 1 TABLET TWICE A DAY 01/08/20  Yes Mahala Menghini, PA-C  topiramate (TOPAMAX) 50 MG tablet TAKE 2 TABLETS IN THE MORNING AND 3 TABLETS AT NIGHT 12/18/19  Yes Ward Givens, NP  triamcinolone cream (KENALOG) 0.1 % triamcinolone acetonide 0.1 % topical cream   Yes [provider]  famotidine (PEPCID) 20 MG tablet Take 20 mg by mouth as needed for heartburn or indigestion. Patient not taking: Reported on 09/08/2020    [provider]    Allergies as of 09/08/2020 - Review Complete 09/08/2020  Allergen Reaction Noted  . Cephalexin Anaphylaxis and Swelling 02/25/2009  . Doxazosin mesylate Other (See Comments) 04/24/2013  . Levofloxacin Anaphylaxis and Rash 02/25/2009  . Codeine Other (See Comments) 02/25/2009  . Indomethacin Other (See Comments) 02/25/2009  . Ivp dye [iodinated diagnostic agents] Hives  04/24/2013  . Morphine Other (See Comments) 02/25/2009  . Sulfamethoxazole-trimethoprim Other (See Comments)   . Penicillins Other (See Comments) 02/25/2009    Family History  Problem Relation Age of Onset  . Stroke Father   . Colon cancer Neg Hx     Social History   Socioeconomic History  . Marital status: Widowed    Spouse name: Not on file  . Number of children: 4  . Years of education: college  . Highest education level: Not on file  Occupational History    Comment: Retired  Tobacco Use  . Smoking status: Never Smoker  . Smokeless tobacco: Never Used  . Tobacco comment: tobacco use - no  Substance and Sexual Activity  . Alcohol use: No    Alcohol/week:  0.0 standard drinks  . Drug use: No  . Sexual activity: Not Currently  Other Topics Concern  . Not on file  Social History Narrative   Retired, widowed, education college education .   Right handed.   Caffeine one cup of coffee daily.    Social Determinants of Health   Financial Resource Strain:   . Difficulty of Paying Living Expenses: Not on file  Food Insecurity:   . Worried About Charity fundraiser in the Last Year: Not on file  . Ran Out of Food in the Last Year: Not on file  Transportation Needs:   . Lack of Transportation (Medical): Not on file  . Lack of Transportation (Non-Medical): Not on file  Physical Activity:   . Days of Exercise per Week: Not on file  . Minutes of Exercise per Session: Not on file  Stress:   . Feeling of Stress : Not on file  Social Connections:   . Frequency of Communication with Friends and Family: Not on file  . Frequency of Social Gatherings with Friends and Family: Not on file  . Attends Religious Services: Not on file  . Active Member of Clubs or Organizations: Not on file  . Attends Archivist Meetings: Not on file  . Marital Status: Not on file  Intimate Partner Violence:   . Fear of Current or Ex-Partner: Not on file  . Emotionally Abused: Not on file    . Physically Abused: Not on file  . Sexually Abused: Not on file    Review of Systems: See HPI, otherwise negative ROS  Physical Exam: BP 122/70   Pulse 64   Temp 97.8 F (36.6 C) (Oral)   Ht 5\' 8"  (1.727 m)   Wt 166 lb 12.8 oz (75.7 kg)   BMI 25.36 kg/m  General:   Alert, frail, elderly,.  Pleasant comfortable appearing and conversant.  She has bilateral wrist splints in place both Neck:  Supple; no masses or thyromegaly. No significant cervical adenopathy. Lungs:  Clear throughout to auscultation.   No wheezes, crackles, or rhonchi. No acute distress. Heart:  Regular rate and rhythm; no murmurs, clicks, rubs,  or gallops. Abdomen: Non-distended, normal bowel sounds.  Soft and nontender without appreciable mass or hepatosplenomegaly.  Pulses:  Normal pulses noted. Extremities: Support hose in place.  1+ lower extremity edema  Impression/Plan: 86 year old lady with multiple medical problems including GERD and chronic constipation.  Some breakthrough symptoms on AcipHex twice daily.  No alarm symptoms.  Constipation fairly well managed with MiraLAX although she has incontinent episode every now and then. Conservative measures to address her symptoms are most appropriate.  Recommendations:  Continue Aciphex 20 mg twice daily -  May take Famotidine 40 mg in the evening if needed on top of Aciphex as needed(Rx for Famotidine 40 mg tablet - disp 30 -with 11 refills)  Decrease Miralax to 1 1/2 teaspoon each morning  Call me in 2 weeks and let me know how things are going  OV here in 3 months      Notice: This dictation was prepared with Dragon dictation along with smaller phrase technology. Any transcriptional errors that result from this process are unintentional and may not be corrected upon review.

## 2020-09-14 DIAGNOSIS — M79671 Pain in right foot: Secondary | ICD-10-CM | POA: Diagnosis not present

## 2020-09-14 DIAGNOSIS — M25579 Pain in unspecified ankle and joints of unspecified foot: Secondary | ICD-10-CM | POA: Diagnosis not present

## 2020-09-14 DIAGNOSIS — M79672 Pain in left foot: Secondary | ICD-10-CM | POA: Diagnosis not present

## 2020-09-21 DIAGNOSIS — I739 Peripheral vascular disease, unspecified: Secondary | ICD-10-CM | POA: Diagnosis not present

## 2020-09-21 DIAGNOSIS — L110 Acquired keratosis follicularis: Secondary | ICD-10-CM | POA: Diagnosis not present

## 2020-09-21 DIAGNOSIS — M79674 Pain in right toe(s): Secondary | ICD-10-CM | POA: Diagnosis not present

## 2020-09-21 DIAGNOSIS — M79675 Pain in left toe(s): Secondary | ICD-10-CM | POA: Diagnosis not present

## 2020-09-21 DIAGNOSIS — M79671 Pain in right foot: Secondary | ICD-10-CM | POA: Diagnosis not present

## 2020-09-21 DIAGNOSIS — M79672 Pain in left foot: Secondary | ICD-10-CM | POA: Diagnosis not present

## 2020-09-24 DIAGNOSIS — E876 Hypokalemia: Secondary | ICD-10-CM | POA: Diagnosis not present

## 2020-09-24 DIAGNOSIS — R42 Dizziness and giddiness: Secondary | ICD-10-CM | POA: Diagnosis not present

## 2020-09-24 DIAGNOSIS — R5381 Other malaise: Secondary | ICD-10-CM | POA: Diagnosis not present

## 2020-09-24 DIAGNOSIS — R627 Adult failure to thrive: Secondary | ICD-10-CM | POA: Diagnosis not present

## 2020-09-24 DIAGNOSIS — G9381 Temporal sclerosis: Secondary | ICD-10-CM | POA: Diagnosis not present

## 2020-09-24 DIAGNOSIS — I5032 Chronic diastolic (congestive) heart failure: Secondary | ICD-10-CM | POA: Diagnosis not present

## 2020-09-24 DIAGNOSIS — D72819 Decreased white blood cell count, unspecified: Secondary | ICD-10-CM | POA: Diagnosis not present

## 2020-09-24 DIAGNOSIS — G9341 Metabolic encephalopathy: Secondary | ICD-10-CM | POA: Diagnosis not present

## 2020-09-24 DIAGNOSIS — R059 Cough, unspecified: Secondary | ICD-10-CM | POA: Diagnosis not present

## 2020-09-24 DIAGNOSIS — R531 Weakness: Secondary | ICD-10-CM | POA: Diagnosis not present

## 2020-09-24 DIAGNOSIS — E860 Dehydration: Secondary | ICD-10-CM | POA: Diagnosis not present

## 2020-09-24 DIAGNOSIS — Z20822 Contact with and (suspected) exposure to covid-19: Secondary | ICD-10-CM | POA: Diagnosis not present

## 2020-09-25 DIAGNOSIS — K219 Gastro-esophageal reflux disease without esophagitis: Secondary | ICD-10-CM | POA: Diagnosis not present

## 2020-09-25 DIAGNOSIS — R29818 Other symptoms and signs involving the nervous system: Secondary | ICD-10-CM | POA: Diagnosis not present

## 2020-09-25 DIAGNOSIS — R5381 Other malaise: Secondary | ICD-10-CM | POA: Diagnosis not present

## 2020-09-25 DIAGNOSIS — R296 Repeated falls: Secondary | ICD-10-CM | POA: Diagnosis not present

## 2020-09-25 DIAGNOSIS — I5032 Chronic diastolic (congestive) heart failure: Secondary | ICD-10-CM | POA: Diagnosis not present

## 2020-09-26 DIAGNOSIS — R5381 Other malaise: Secondary | ICD-10-CM | POA: Diagnosis not present

## 2020-09-26 DIAGNOSIS — E876 Hypokalemia: Secondary | ICD-10-CM | POA: Diagnosis not present

## 2020-09-26 DIAGNOSIS — R296 Repeated falls: Secondary | ICD-10-CM | POA: Diagnosis not present

## 2020-09-26 DIAGNOSIS — K219 Gastro-esophageal reflux disease without esophagitis: Secondary | ICD-10-CM | POA: Diagnosis not present

## 2020-09-27 DIAGNOSIS — E876 Hypokalemia: Secondary | ICD-10-CM | POA: Diagnosis not present

## 2020-09-27 DIAGNOSIS — R5381 Other malaise: Secondary | ICD-10-CM | POA: Diagnosis not present

## 2020-09-27 DIAGNOSIS — K219 Gastro-esophageal reflux disease without esophagitis: Secondary | ICD-10-CM | POA: Diagnosis not present

## 2020-09-27 DIAGNOSIS — R296 Repeated falls: Secondary | ICD-10-CM | POA: Diagnosis not present

## 2020-09-28 DIAGNOSIS — R627 Adult failure to thrive: Secondary | ICD-10-CM | POA: Diagnosis present

## 2020-09-28 DIAGNOSIS — G629 Polyneuropathy, unspecified: Secondary | ICD-10-CM | POA: Diagnosis present

## 2020-09-28 DIAGNOSIS — R296 Repeated falls: Secondary | ICD-10-CM | POA: Diagnosis present

## 2020-09-28 DIAGNOSIS — Z79899 Other long term (current) drug therapy: Secondary | ICD-10-CM | POA: Diagnosis not present

## 2020-09-28 DIAGNOSIS — Z6824 Body mass index (BMI) 24.0-24.9, adult: Secondary | ICD-10-CM | POA: Diagnosis not present

## 2020-09-28 DIAGNOSIS — Z7401 Bed confinement status: Secondary | ICD-10-CM | POA: Diagnosis not present

## 2020-09-28 DIAGNOSIS — R4182 Altered mental status, unspecified: Secondary | ICD-10-CM | POA: Diagnosis not present

## 2020-09-28 DIAGNOSIS — Z79890 Hormone replacement therapy (postmenopausal): Secondary | ICD-10-CM | POA: Diagnosis not present

## 2020-09-28 DIAGNOSIS — K449 Diaphragmatic hernia without obstruction or gangrene: Secondary | ICD-10-CM | POA: Diagnosis not present

## 2020-09-28 DIAGNOSIS — K219 Gastro-esophageal reflux disease without esophagitis: Secondary | ICD-10-CM | POA: Diagnosis present

## 2020-09-28 DIAGNOSIS — Z880 Allergy status to penicillin: Secondary | ICD-10-CM | POA: Diagnosis not present

## 2020-09-28 DIAGNOSIS — E039 Hypothyroidism, unspecified: Secondary | ICD-10-CM | POA: Diagnosis present

## 2020-09-28 DIAGNOSIS — Z20822 Contact with and (suspected) exposure to covid-19: Secondary | ICD-10-CM | POA: Diagnosis present

## 2020-09-28 DIAGNOSIS — R569 Unspecified convulsions: Secondary | ICD-10-CM | POA: Diagnosis not present

## 2020-09-28 DIAGNOSIS — Z7952 Long term (current) use of systemic steroids: Secondary | ICD-10-CM | POA: Diagnosis not present

## 2020-09-28 DIAGNOSIS — Z7982 Long term (current) use of aspirin: Secondary | ICD-10-CM | POA: Diagnosis not present

## 2020-09-28 DIAGNOSIS — Z9049 Acquired absence of other specified parts of digestive tract: Secondary | ICD-10-CM | POA: Diagnosis not present

## 2020-09-28 DIAGNOSIS — Z9181 History of falling: Secondary | ICD-10-CM | POA: Diagnosis not present

## 2020-09-28 DIAGNOSIS — Z91041 Radiographic dye allergy status: Secondary | ICD-10-CM | POA: Diagnosis not present

## 2020-09-28 DIAGNOSIS — I5032 Chronic diastolic (congestive) heart failure: Secondary | ICD-10-CM | POA: Diagnosis present

## 2020-09-28 DIAGNOSIS — R2689 Other abnormalities of gait and mobility: Secondary | ICD-10-CM | POA: Diagnosis not present

## 2020-09-28 DIAGNOSIS — R41841 Cognitive communication deficit: Secondary | ICD-10-CM | POA: Diagnosis not present

## 2020-09-28 DIAGNOSIS — R531 Weakness: Secondary | ICD-10-CM | POA: Diagnosis present

## 2020-09-28 DIAGNOSIS — Z791 Long term (current) use of non-steroidal anti-inflammatories (NSAID): Secondary | ICD-10-CM | POA: Diagnosis not present

## 2020-09-28 DIAGNOSIS — M1990 Unspecified osteoarthritis, unspecified site: Secondary | ICD-10-CM | POA: Diagnosis present

## 2020-09-28 DIAGNOSIS — J90 Pleural effusion, not elsewhere classified: Secondary | ICD-10-CM | POA: Diagnosis not present

## 2020-09-28 DIAGNOSIS — R0902 Hypoxemia: Secondary | ICD-10-CM | POA: Diagnosis present

## 2020-09-28 DIAGNOSIS — E876 Hypokalemia: Secondary | ICD-10-CM | POA: Diagnosis present

## 2020-09-28 DIAGNOSIS — R5381 Other malaise: Secondary | ICD-10-CM | POA: Diagnosis not present

## 2020-09-28 DIAGNOSIS — N3000 Acute cystitis without hematuria: Secondary | ICD-10-CM | POA: Diagnosis present

## 2020-09-28 DIAGNOSIS — M6281 Muscle weakness (generalized): Secondary | ICD-10-CM | POA: Diagnosis not present

## 2020-09-28 DIAGNOSIS — R0600 Dyspnea, unspecified: Secondary | ICD-10-CM | POA: Diagnosis not present

## 2020-09-28 DIAGNOSIS — E875 Hyperkalemia: Secondary | ICD-10-CM | POA: Diagnosis not present

## 2020-09-29 DIAGNOSIS — K219 Gastro-esophageal reflux disease without esophagitis: Secondary | ICD-10-CM | POA: Diagnosis not present

## 2020-09-29 DIAGNOSIS — R296 Repeated falls: Secondary | ICD-10-CM | POA: Diagnosis not present

## 2020-09-29 DIAGNOSIS — R4182 Altered mental status, unspecified: Secondary | ICD-10-CM | POA: Diagnosis not present

## 2020-09-29 DIAGNOSIS — R0902 Hypoxemia: Secondary | ICD-10-CM | POA: Diagnosis not present

## 2020-09-29 DIAGNOSIS — R5381 Other malaise: Secondary | ICD-10-CM | POA: Diagnosis not present

## 2020-09-29 DIAGNOSIS — E875 Hyperkalemia: Secondary | ICD-10-CM | POA: Diagnosis not present

## 2020-09-30 DIAGNOSIS — R5381 Other malaise: Secondary | ICD-10-CM | POA: Diagnosis not present

## 2020-09-30 DIAGNOSIS — R296 Repeated falls: Secondary | ICD-10-CM | POA: Diagnosis not present

## 2020-09-30 DIAGNOSIS — I5032 Chronic diastolic (congestive) heart failure: Secondary | ICD-10-CM | POA: Diagnosis not present

## 2020-09-30 DIAGNOSIS — K219 Gastro-esophageal reflux disease without esophagitis: Secondary | ICD-10-CM | POA: Diagnosis not present

## 2020-10-01 ENCOUNTER — Encounter: Payer: Self-pay | Admitting: Nurse Practitioner

## 2020-10-01 DIAGNOSIS — R627 Adult failure to thrive: Secondary | ICD-10-CM | POA: Diagnosis present

## 2020-10-01 DIAGNOSIS — M6281 Muscle weakness (generalized): Secondary | ICD-10-CM | POA: Diagnosis not present

## 2020-10-01 DIAGNOSIS — Z6821 Body mass index (BMI) 21.0-21.9, adult: Secondary | ICD-10-CM | POA: Diagnosis not present

## 2020-10-01 DIAGNOSIS — I509 Heart failure, unspecified: Secondary | ICD-10-CM | POA: Diagnosis not present

## 2020-10-01 DIAGNOSIS — Z23 Encounter for immunization: Secondary | ICD-10-CM | POA: Diagnosis not present

## 2020-10-01 DIAGNOSIS — M25511 Pain in right shoulder: Secondary | ICD-10-CM | POA: Diagnosis not present

## 2020-10-01 DIAGNOSIS — G40909 Epilepsy, unspecified, not intractable, without status epilepticus: Secondary | ICD-10-CM | POA: Diagnosis present

## 2020-10-01 DIAGNOSIS — Z20822 Contact with and (suspected) exposure to covid-19: Secondary | ICD-10-CM | POA: Diagnosis present

## 2020-10-01 DIAGNOSIS — R0602 Shortness of breath: Secondary | ICD-10-CM | POA: Diagnosis not present

## 2020-10-01 DIAGNOSIS — Z7982 Long term (current) use of aspirin: Secondary | ICD-10-CM | POA: Diagnosis not present

## 2020-10-01 DIAGNOSIS — R2689 Other abnormalities of gait and mobility: Secondary | ICD-10-CM | POA: Diagnosis not present

## 2020-10-01 DIAGNOSIS — R569 Unspecified convulsions: Secondary | ICD-10-CM | POA: Diagnosis not present

## 2020-10-01 DIAGNOSIS — R296 Repeated falls: Secondary | ICD-10-CM | POA: Diagnosis not present

## 2020-10-01 DIAGNOSIS — N3000 Acute cystitis without hematuria: Secondary | ICD-10-CM | POA: Diagnosis not present

## 2020-10-01 DIAGNOSIS — N390 Urinary tract infection, site not specified: Secondary | ICD-10-CM | POA: Diagnosis not present

## 2020-10-01 DIAGNOSIS — N179 Acute kidney failure, unspecified: Secondary | ICD-10-CM | POA: Diagnosis present

## 2020-10-01 DIAGNOSIS — N1832 Chronic kidney disease, stage 3b: Secondary | ICD-10-CM | POA: Diagnosis present

## 2020-10-01 DIAGNOSIS — R0902 Hypoxemia: Secondary | ICD-10-CM | POA: Diagnosis present

## 2020-10-01 DIAGNOSIS — Z9049 Acquired absence of other specified parts of digestive tract: Secondary | ICD-10-CM | POA: Diagnosis not present

## 2020-10-01 DIAGNOSIS — R531 Weakness: Secondary | ICD-10-CM | POA: Diagnosis present

## 2020-10-01 DIAGNOSIS — K219 Gastro-esophageal reflux disease without esophagitis: Secondary | ICD-10-CM | POA: Diagnosis not present

## 2020-10-01 DIAGNOSIS — R5381 Other malaise: Secondary | ICD-10-CM | POA: Diagnosis not present

## 2020-10-01 DIAGNOSIS — E039 Hypothyroidism, unspecified: Secondary | ICD-10-CM | POA: Diagnosis present

## 2020-10-01 DIAGNOSIS — R4182 Altered mental status, unspecified: Secondary | ICD-10-CM | POA: Diagnosis not present

## 2020-10-01 DIAGNOSIS — Z79890 Hormone replacement therapy (postmenopausal): Secondary | ICD-10-CM | POA: Diagnosis not present

## 2020-10-01 DIAGNOSIS — R0600 Dyspnea, unspecified: Secondary | ICD-10-CM | POA: Diagnosis not present

## 2020-10-01 DIAGNOSIS — H9190 Unspecified hearing loss, unspecified ear: Secondary | ICD-10-CM | POA: Diagnosis present

## 2020-10-01 DIAGNOSIS — I5032 Chronic diastolic (congestive) heart failure: Secondary | ICD-10-CM | POA: Diagnosis not present

## 2020-10-01 DIAGNOSIS — Z87440 Personal history of urinary (tract) infections: Secondary | ICD-10-CM | POA: Diagnosis not present

## 2020-10-01 DIAGNOSIS — R41841 Cognitive communication deficit: Secondary | ICD-10-CM | POA: Diagnosis not present

## 2020-10-01 DIAGNOSIS — G9341 Metabolic encephalopathy: Secondary | ICD-10-CM | POA: Diagnosis present

## 2020-10-01 DIAGNOSIS — R410 Disorientation, unspecified: Secondary | ICD-10-CM | POA: Diagnosis not present

## 2020-10-01 DIAGNOSIS — M1990 Unspecified osteoarthritis, unspecified site: Secondary | ICD-10-CM | POA: Diagnosis present

## 2020-10-01 DIAGNOSIS — Z7952 Long term (current) use of systemic steroids: Secondary | ICD-10-CM | POA: Diagnosis not present

## 2020-10-01 DIAGNOSIS — R079 Chest pain, unspecified: Secondary | ICD-10-CM | POA: Diagnosis not present

## 2020-10-01 DIAGNOSIS — Z7401 Bed confinement status: Secondary | ICD-10-CM | POA: Diagnosis not present

## 2020-10-01 DIAGNOSIS — E876 Hypokalemia: Secondary | ICD-10-CM | POA: Diagnosis not present

## 2020-10-02 DIAGNOSIS — R531 Weakness: Secondary | ICD-10-CM | POA: Diagnosis not present

## 2020-10-02 DIAGNOSIS — K219 Gastro-esophageal reflux disease without esophagitis: Secondary | ICD-10-CM | POA: Diagnosis not present

## 2020-10-02 DIAGNOSIS — I5032 Chronic diastolic (congestive) heart failure: Secondary | ICD-10-CM | POA: Diagnosis not present

## 2020-10-02 DIAGNOSIS — E039 Hypothyroidism, unspecified: Secondary | ICD-10-CM | POA: Diagnosis not present

## 2020-10-07 ENCOUNTER — Other Ambulatory Visit: Payer: Self-pay | Admitting: *Deleted

## 2020-10-07 NOTE — Patient Outreach (Signed)
Member screened for potential [facility] Care Management needs as a benefit of [employer].  Per Patient Ping member resides in [facility].   Communication sent to [facility] SW to collaborate about anticipated dc plans and potential [facility] Care Management needs.  Will continue to follow while member resides in SNF.   Shannia Jacuinde, MSN-Ed, RN,BSN [facility] Post Acute Care Coordinator [phone] ( Business Mobile) [phone]  (Toll free office)  

## 2020-10-12 ENCOUNTER — Other Ambulatory Visit: Payer: Self-pay | Admitting: *Deleted

## 2020-10-12 NOTE — Patient Outreach (Signed)
[facility] Post- Acute Care Coordinator follow up. Member screened for potential [facility] Care Management needs as a benefit of [place] Medicare.  Per Patient Pearletha Forge resides in [facility]. Update previously received from SNF SW indicated member lives alone but has good family support. Will be in SNF for short term.   Will continue to follow for potential [facility] Care Management needs.    Marthenia Rolling, MSN-Ed, RN,BSN [place] Acute Care Coordinator [phone] [facility]) [phone]  (Toll free office)

## 2020-10-26 ENCOUNTER — Other Ambulatory Visit: Payer: Self-pay | Admitting: *Deleted

## 2020-10-26 NOTE — Patient Outreach (Signed)
[facility] Post- Acute Care Coordinator follow up. Member screened for potential [facility] Care Management needs as a benefit of [place] Medicare.  Verified in Patient [name] that member resides in [facility].   Communication sent to SNF SW to inquire about transition plans and potential [facility] Care Management needs.   Will continue to follow while member resides in SNF.    Marthenia Rolling, MSN-Ed, RN,BSN [place] Acute Care Coordinator [phone] [facility])

## 2020-10-27 ENCOUNTER — Other Ambulatory Visit: Payer: Self-pay | Admitting: *Deleted

## 2020-10-27 NOTE — Patient Outreach (Signed)
[place] Coordinator follow up. Member screened for potential [facility] Care Management needs as a benefit of [place] Medicare.  Update received from SNF SW indicating member lives alone and has very supportive children.   Will continue to follow for potential [facility] needs while member resides in SNF.  Marthenia Rolling, MSN-Ed, RN,BSN [place] Acute Care Coordinator [phone] [facility]) [phone]  (Toll free office)

## 2020-10-27 NOTE — Progress Notes (Deleted)
PATIENT: [name] DOB: 02/25/1930  REASON FOR VISIT: follow up HISTORY FROM: patient  HISTORY OF PRESENT ILLNESS: Today 10/27/20:  Ms. Tarkington is an 86 year old female with a history of daily headaches and peripheral neuropathy.  She returns today for follow-up.  HISTORY 04/23/20:  Ms. Angert is an 86 year old female with a history of daily headaches and peripheral neuropathy.  She returns today for sooner follow-up.  Reports that starting approximately 3 weeks ago she began having a headache in the right temporal region.  She states that it hurts behind the eye.  She states that night if she sleeps on the right side of her head it feels numb.  She feels that she has a weight behind the eye.  She continues on Topamax.  Unsure about any significant visual changes.  She continues to have trouble with her balance.  She uses a cane.  Reports that she has had some falls but fortunately no injuries.  She has not had any imaging of the brain since 2009.   REVIEW OF SYSTEMS: Out of a complete 14 system review of symptoms, the patient complains only of the following symptoms, and all other reviewed systems are negative.  ALLERGIES: Allergies  Allergen Reactions  . Cephalexin Anaphylaxis and Swelling  . Doxazosin Mesylate Other (See Comments)    Made patient EXTREMELY sick.   . Levofloxacin Anaphylaxis and Rash  . Codeine Other (See Comments)    Hallucinations  . Indomethacin Other (See Comments)    Severe Headache   . Ivp Dye [Iodinated Diagnostic Agents] Hives  . Morphine Other (See Comments)    Hallucinations   . Sulfamethoxazole-Trimethoprim Other (See Comments)    Unknown   . Penicillins Other (See Comments)    Made patient go into shock Has patient had a PCN reaction causing immediate rash, facial/tongue/throat swelling, SOB or lightheadedness with hypotension: Yes Has patient had a PCN reaction causing severe rash involving mucus membranes or skin necrosis: No Has  patient had a PCN reaction that required hospitalization No Has patient had a PCN reaction occurring within the last 10 years: No If all of the above answers are "NO", then may proceed with Cephalosporin use.     HOME MEDICATIONS: Outpatient Medications Prior to Visit  Medication Sig Dispense Refill  . Ascorbic Acid (VITA-C PO) Take by mouth daily.    . Ascorbic Acid (VITAMIN C PO) Take by mouth daily.    Marland Kitchen aspirin EC 81 MG tablet Take 81 mg by mouth daily.    Marland Kitchen CALCIUM PO Take by mouth daily.    . cetirizine (ZYRTEC) 10 MG tablet Take 10 mg by mouth daily.    . Cholecalciferol (VITAMIN D) 2000 units CAPS Take by mouth daily.    Marland Kitchen ESTRACE VAGINAL 0.1 MG/GM vaginal cream     . famotidine (PEPCID) 20 MG tablet Take 20 mg by mouth as needed for heartburn or indigestion. (Patient not taking: Reported on 09/08/2020)    . famotidine (PEPCID) 40 MG tablet Take 1 tablet (40 mg total) by mouth daily as needed for heartburn or indigestion. Take in the evening 30 tablet 11  . fludrocortisone (FLORINEF) 0.1 MG tablet TAKE 1 TABLET THREE TIMES A WEEK 36 tablet 3  . fluticasone (FLONASE) 50 MCG/ACT nasal spray Place 2 sprays into the nose as needed for allergies.     . furosemide (LASIX) 20 MG tablet Take 40 mg by mouth daily.     Marland Kitchen ipratropium (ATROVENT) 0.06 % nasal spray  Place 1 spray into the nose as needed.    . Lactobacillus (PROBIOTIC ACIDOPHILUS PO) Take by mouth daily.    Marland Kitchen levothyroxine (SYNTHROID, LEVOTHROID) 75 MCG tablet Take 75 mcg by mouth daily before breakfast.    . MELOXICAM PO Take by mouth daily.    . methenamine (HIPREX) 1 g tablet Take 1 g by mouth 2 (two) times daily with a meal.    . montelukast (SINGULAIR) 10 MG tablet Take 10 mg by mouth at bedtime.     . Multiple Vitamin (MULTIVITAMIN) tablet Take 1 tablet by mouth daily.    . nitroGLYCERIN (NITROSTAT) 0.4 MG SL tablet Place 1 tablet (0.4 mg total) under the tongue every 5 (five) minutes x 3 doses as needed for chest pain (if  no relief after 3rd dose, proceed to the ED for an evaluation). 75 tablet 0  . Omega-3 Fatty Acids (FISH OIL) 1000 MG CAPS Take by mouth.    Vladimir Faster Glycol-Propyl Glycol (SYSTANE OP) Apply 1 drop to eye daily.     . polyethylene glycol (MIRALAX / GLYCOLAX) packet Take 17 g by mouth daily.     . polyvinyl alcohol (LIQUIFILM TEARS) 1.4 % ophthalmic solution Place 1 drop into both eyes as needed for dry eyes. 15 mL 0  . Probiotic Product (PROBIOTIC PO) Take by mouth daily.    . RABEprazole (ACIPHEX) 20 MG tablet TAKE 1 TABLET TWICE A DAY 180 tablet 3  . topiramate (TOPAMAX) 50 MG tablet TAKE 2 TABLETS IN THE MORNING AND 3 TABLETS AT NIGHT 450 tablet 3  . triamcinolone cream (KENALOG) 0.1 % triamcinolone acetonide 0.1 % topical cream     No facility-administered medications prior to visit.    PAST MEDICAL HISTORY: Past Medical History:  Diagnosis Date  . Arthritis   . Chronic diastolic heart failure ([place])   . Diverticulosis of colon (without mention of hemorrhage)    TCS by Dr. Gala Romney  . GERD (gastroesophageal reflux disease)   . Hemorrhoids, internal    TCS by Dr. Gala Romney  . Irritable bowel syndrome   . Migraine   . Mitral regurgitation   . Peripheral polyneuropathy    Confirmed by EMG and nerve conduction velocities   . Pulmonary nodule   . Seizures ([place]) 2011  . Syncope    Neurocardiogenic - positive tilt table test, on Florinef  . Tubular adenoma   . UTI (urinary tract infection)    Recurrent    PAST SURGICAL HISTORY: Past Surgical History:  Procedure Laterality Date  . APPENDECTOMY    . Back Pain     going to chiropracator for back and hip pain  . BACK SURGERY     x2  . bladder tack    . CATARACT EXTRACTION, BILATERAL  2017  . CHOLECYSTECTOMY    . COLONOSCOPY  06/16/2005   internal hemorrhoids, L side diverticula - Dr. Gala Romney  . COLONOSCOPY N/A 05/02/2013   Dr. Gala Romney- normal rectum, scattered L sided diverticula. bx= tubular adenoma  . ESOPHAGOGASTRODUODENOSCOPY   12/21/2006     Dr. Vivi Ferns esophagus/Patulous EG junction, small to moderate sized hiatal hernia,   multiple fundal gland type appearing polyps biopsied, otherwise normal  . FOOT SURGERY     cyst removed  . TOTAL ABDOMINAL HYSTERECTOMY    . TOTAL HIP ARTHROPLASTY Right 01/11/2017   Procedure: RIGHT TOTAL HIP ARTHROPLASTY ANTERIOR APPROACH;  Surgeon: Gaynelle Arabian, MD;  Location: [facility];  Service: Orthopedics;  Laterality: Right;  . WRIST SURGERY  FAMILY HISTORY: Family History  Problem Relation Age of Onset  . Stroke Father   . Colon cancer Neg Hx     SOCIAL HISTORY: Social History   Socioeconomic History  . Marital status: Widowed    Spouse name: Not on file  . Number of children: 4  . Years of education: college  . Highest education level: Not on file  Occupational History    Comment: Retired  Tobacco Use  . Smoking status: Never Smoker  . Smokeless tobacco: Never Used  . Tobacco comment: tobacco use - no  Substance and Sexual Activity  . Alcohol use: No    Alcohol/week: 0.0 standard drinks  . Drug use: No  . Sexual activity: Not Currently  Other Topics Concern  . Not on file  Social History Narrative   Retired, widowed, education college education .   Right handed.   Caffeine one cup of coffee daily.    Social Determinants of Health   Financial Resource Strain:   . Difficulty of Paying Living Expenses: Not on file  Food Insecurity:   . Worried About Charity fundraiser in the Last Year: Not on file  . Ran Out of Food in the Last Year: Not on file  Transportation Needs:   . Lack of Transportation (Medical): Not on file  . Lack of Transportation (Non-Medical): Not on file  Physical Activity:   . Days of Exercise per Week: Not on file  . Minutes of Exercise per Session: Not on file  Stress:   . Feeling of Stress : Not on file  Social Connections:   . Frequency of Communication with Friends and Family: Not on file  . Frequency of Social Gatherings  with Friends and Family: Not on file  . Attends Religious Services: Not on file  . Active Member of Clubs or Organizations: Not on file  . Attends Archivist Meetings: Not on file  . Marital Status: Not on file  Intimate Partner Violence:   . Fear of Current or Ex-Partner: Not on file  . Emotionally Abused: Not on file  . Physically Abused: Not on file  . Sexually Abused: Not on file      PHYSICAL EXAM  There were no vitals filed for this visit. There is no height or weight on file to calculate BMI.  Generalized: Well developed, in no acute distress   Neurological examination  Mentation: Alert oriented to time, place, history taking. Follows all commands speech and language fluent Cranial nerve II-XII: Pupils were equal round reactive to light. Extraocular movements were full, visual field were full on confrontational test. Facial sensation and strength were normal. Uvula tongue midline. Head turning and shoulder shrug  were normal and symmetric. Motor: The motor testing reveals 5 over 5 strength of all 4 extremities. Good symmetric motor tone is noted throughout.  Sensory: Sensory testing is intact to soft touch on all 4 extremities. No evidence of extinction is noted.  Coordination: Cerebellar testing reveals good finger-nose-finger and heel-to-shin bilaterally.  Gait and station: Gait is normal. Tandem gait is normal. Romberg is negative. No drift is seen.  Reflexes: Deep tendon reflexes are symmetric and normal bilaterally.   DIAGNOSTIC DATA (LABS, IMAGING, TESTING) - I reviewed patient records, labs, notes, testing and imaging myself where available.  Lab Results  Component Value Date   WBC 3.4 04/23/2020   HGB 11.2 04/23/2020   HCT 35.1 04/23/2020   MCV 86 04/23/2020   PLT 138 (L) 04/23/2020  Component Value Date/Time   NA 147 (H) 04/23/2020 1407   K 3.6 04/23/2020 1407   CL 108 (H) 04/23/2020 1407   CO2 27 04/23/2020 1407   GLUCOSE 87 04/23/2020  1407   GLUCOSE 86 08/16/2018 1153   BUN 17 04/23/2020 1407   CREATININE 0.90 04/23/2020 1407   CREATININE 0.82 08/16/2018 1153   CALCIUM 10.0 04/23/2020 1407   PROT 5.5 (L) 04/23/2020 1407   ALBUMIN 3.7 04/23/2020 1407   AST 24 04/23/2020 1407   ALT 14 04/23/2020 1407   ALKPHOS 89 04/23/2020 1407   BILITOT 0.4 04/23/2020 1407   GFRNONAA 57 (L) 04/23/2020 1407   GFRNONAA 65 08/16/2018 1153   GFRAA 66 04/23/2020 1407   GFRAA 75 08/16/2018 1153   No results found for: CHOL, HDL, LDLCALC, LDLDIRECT, TRIG, CHOLHDL No results found for: HGBA1C No results found for: VITAMINB12 Lab Results  Component Value Date   TSH 1.73 08/16/2018      ASSESSMENT AND PLAN 86 y.o. year old female  has a past medical history of Arthritis, Chronic diastolic heart failure ([place]), Diverticulosis of colon (without mention of hemorrhage), GERD (gastroesophageal reflux disease), Hemorrhoids, internal, Irritable bowel syndrome, Migraine, Mitral regurgitation, Peripheral polyneuropathy, Pulmonary nodule, Seizures ([place]) (2011), Syncope, Tubular adenoma, and UTI (urinary tract infection). here with ***   I spent *** minutes of face-to-face and non-face-to-face time with patient.  This included previsit chart review, lab review, study review, order entry, electronic health record documentation, patient education.  Ward Givens, MSN, NP-C 10/27/2020, 9:36 AM [facility] [address], [place], [place] [address] [phone]

## 2020-10-28 ENCOUNTER — Ambulatory Visit: Payer: Medicare Other | Admitting: Adult Health

## 2020-10-28 ENCOUNTER — Encounter: Payer: Self-pay | Admitting: Adult Health

## 2020-10-28 DIAGNOSIS — M25511 Pain in right shoulder: Secondary | ICD-10-CM | POA: Diagnosis not present

## 2020-10-28 DIAGNOSIS — R079 Chest pain, unspecified: Secondary | ICD-10-CM | POA: Diagnosis not present

## 2020-11-03 DIAGNOSIS — Z23 Encounter for immunization: Secondary | ICD-10-CM | POA: Diagnosis not present

## 2020-11-10 DIAGNOSIS — N390 Urinary tract infection, site not specified: Secondary | ICD-10-CM | POA: Diagnosis not present

## 2020-11-10 DIAGNOSIS — M1990 Unspecified osteoarthritis, unspecified site: Secondary | ICD-10-CM | POA: Diagnosis present

## 2020-11-10 DIAGNOSIS — R627 Adult failure to thrive: Secondary | ICD-10-CM | POA: Diagnosis not present

## 2020-11-10 DIAGNOSIS — R41841 Cognitive communication deficit: Secondary | ICD-10-CM | POA: Diagnosis not present

## 2020-11-10 DIAGNOSIS — R2689 Other abnormalities of gait and mobility: Secondary | ICD-10-CM | POA: Diagnosis not present

## 2020-11-10 DIAGNOSIS — N1832 Chronic kidney disease, stage 3b: Secondary | ICD-10-CM | POA: Diagnosis present

## 2020-11-10 DIAGNOSIS — R0602 Shortness of breath: Secondary | ICD-10-CM | POA: Diagnosis not present

## 2020-11-10 DIAGNOSIS — R0902 Hypoxemia: Secondary | ICD-10-CM | POA: Diagnosis not present

## 2020-11-10 DIAGNOSIS — Z7982 Long term (current) use of aspirin: Secondary | ICD-10-CM | POA: Diagnosis not present

## 2020-11-10 DIAGNOSIS — M6281 Muscle weakness (generalized): Secondary | ICD-10-CM | POA: Diagnosis not present

## 2020-11-10 DIAGNOSIS — R531 Weakness: Secondary | ICD-10-CM | POA: Diagnosis present

## 2020-11-10 DIAGNOSIS — I5030 Unspecified diastolic (congestive) heart failure: Secondary | ICD-10-CM | POA: Diagnosis not present

## 2020-11-10 DIAGNOSIS — G40909 Epilepsy, unspecified, not intractable, without status epilepticus: Secondary | ICD-10-CM | POA: Diagnosis not present

## 2020-11-10 DIAGNOSIS — R296 Repeated falls: Secondary | ICD-10-CM | POA: Diagnosis not present

## 2020-11-10 DIAGNOSIS — N183 Chronic kidney disease, stage 3 unspecified: Secondary | ICD-10-CM | POA: Diagnosis not present

## 2020-11-10 DIAGNOSIS — R630 Anorexia: Secondary | ICD-10-CM | POA: Diagnosis not present

## 2020-11-10 DIAGNOSIS — I10 Essential (primary) hypertension: Secondary | ICD-10-CM | POA: Diagnosis not present

## 2020-11-10 DIAGNOSIS — H9190 Unspecified hearing loss, unspecified ear: Secondary | ICD-10-CM | POA: Diagnosis present

## 2020-11-10 DIAGNOSIS — R4182 Altered mental status, unspecified: Secondary | ICD-10-CM | POA: Diagnosis not present

## 2020-11-10 DIAGNOSIS — Z87440 Personal history of urinary (tract) infections: Secondary | ICD-10-CM | POA: Diagnosis not present

## 2020-11-10 DIAGNOSIS — R0600 Dyspnea, unspecified: Secondary | ICD-10-CM | POA: Diagnosis not present

## 2020-11-10 DIAGNOSIS — R404 Transient alteration of awareness: Secondary | ICD-10-CM | POA: Diagnosis not present

## 2020-11-10 DIAGNOSIS — Z9049 Acquired absence of other specified parts of digestive tract: Secondary | ICD-10-CM | POA: Diagnosis not present

## 2020-11-10 DIAGNOSIS — Z79890 Hormone replacement therapy (postmenopausal): Secondary | ICD-10-CM | POA: Diagnosis not present

## 2020-11-10 DIAGNOSIS — Z20822 Contact with and (suspected) exposure to covid-19: Secondary | ICD-10-CM | POA: Diagnosis present

## 2020-11-10 DIAGNOSIS — Z6821 Body mass index (BMI) 21.0-21.9, adult: Secondary | ICD-10-CM | POA: Diagnosis not present

## 2020-11-10 DIAGNOSIS — G9341 Metabolic encephalopathy: Secondary | ICD-10-CM | POA: Diagnosis present

## 2020-11-10 DIAGNOSIS — I130 Hypertensive heart and chronic kidney disease with heart failure and stage 1 through stage 4 chronic kidney disease, or unspecified chronic kidney disease: Secondary | ICD-10-CM | POA: Diagnosis not present

## 2020-11-10 DIAGNOSIS — Z7952 Long term (current) use of systemic steroids: Secondary | ICD-10-CM | POA: Diagnosis not present

## 2020-11-10 DIAGNOSIS — I509 Heart failure, unspecified: Secondary | ICD-10-CM | POA: Diagnosis not present

## 2020-11-10 DIAGNOSIS — R410 Disorientation, unspecified: Secondary | ICD-10-CM | POA: Diagnosis not present

## 2020-11-10 DIAGNOSIS — I5032 Chronic diastolic (congestive) heart failure: Secondary | ICD-10-CM | POA: Diagnosis not present

## 2020-11-10 DIAGNOSIS — N179 Acute kidney failure, unspecified: Secondary | ICD-10-CM | POA: Diagnosis present

## 2020-11-10 DIAGNOSIS — E876 Hypokalemia: Secondary | ICD-10-CM | POA: Diagnosis not present

## 2020-11-10 DIAGNOSIS — E039 Hypothyroidism, unspecified: Secondary | ICD-10-CM | POA: Diagnosis present

## 2020-11-10 DIAGNOSIS — K219 Gastro-esophageal reflux disease without esophagitis: Secondary | ICD-10-CM | POA: Diagnosis present

## 2020-11-11 DIAGNOSIS — R0602 Shortness of breath: Secondary | ICD-10-CM | POA: Diagnosis not present

## 2020-11-11 DIAGNOSIS — R531 Weakness: Secondary | ICD-10-CM | POA: Diagnosis not present

## 2020-11-11 DIAGNOSIS — R0902 Hypoxemia: Secondary | ICD-10-CM | POA: Diagnosis not present

## 2020-11-11 DIAGNOSIS — E039 Hypothyroidism, unspecified: Secondary | ICD-10-CM | POA: Diagnosis not present

## 2020-11-11 DIAGNOSIS — R4182 Altered mental status, unspecified: Secondary | ICD-10-CM | POA: Diagnosis not present

## 2020-11-12 DIAGNOSIS — R531 Weakness: Secondary | ICD-10-CM | POA: Diagnosis not present

## 2020-11-12 DIAGNOSIS — N390 Urinary tract infection, site not specified: Secondary | ICD-10-CM | POA: Diagnosis not present

## 2020-11-12 DIAGNOSIS — E039 Hypothyroidism, unspecified: Secondary | ICD-10-CM | POA: Diagnosis not present

## 2020-11-12 DIAGNOSIS — R4182 Altered mental status, unspecified: Secondary | ICD-10-CM | POA: Diagnosis not present

## 2020-11-13 DIAGNOSIS — R4182 Altered mental status, unspecified: Secondary | ICD-10-CM | POA: Diagnosis not present

## 2020-11-13 DIAGNOSIS — R0902 Hypoxemia: Secondary | ICD-10-CM | POA: Diagnosis not present

## 2020-11-13 DIAGNOSIS — R531 Weakness: Secondary | ICD-10-CM | POA: Diagnosis not present

## 2020-11-14 DIAGNOSIS — R531 Weakness: Secondary | ICD-10-CM | POA: Diagnosis not present

## 2020-11-14 DIAGNOSIS — R0902 Hypoxemia: Secondary | ICD-10-CM | POA: Diagnosis not present

## 2020-11-14 DIAGNOSIS — R4182 Altered mental status, unspecified: Secondary | ICD-10-CM | POA: Diagnosis not present

## 2020-11-15 DIAGNOSIS — R0902 Hypoxemia: Secondary | ICD-10-CM | POA: Diagnosis not present

## 2020-11-15 DIAGNOSIS — R531 Weakness: Secondary | ICD-10-CM | POA: Diagnosis not present

## 2020-11-16 DIAGNOSIS — E039 Hypothyroidism, unspecified: Secondary | ICD-10-CM | POA: Diagnosis not present

## 2020-11-16 DIAGNOSIS — R0902 Hypoxemia: Secondary | ICD-10-CM | POA: Diagnosis not present

## 2020-11-16 DIAGNOSIS — R531 Weakness: Secondary | ICD-10-CM | POA: Diagnosis not present

## 2020-11-16 DIAGNOSIS — R4182 Altered mental status, unspecified: Secondary | ICD-10-CM | POA: Diagnosis not present

## 2020-11-16 DIAGNOSIS — N390 Urinary tract infection, site not specified: Secondary | ICD-10-CM | POA: Diagnosis not present

## 2020-11-17 DIAGNOSIS — N183 Chronic kidney disease, stage 3 unspecified: Secondary | ICD-10-CM | POA: Diagnosis not present

## 2020-11-17 DIAGNOSIS — R4182 Altered mental status, unspecified: Secondary | ICD-10-CM | POA: Diagnosis not present

## 2020-11-17 DIAGNOSIS — N390 Urinary tract infection, site not specified: Secondary | ICD-10-CM | POA: Diagnosis not present

## 2020-11-17 DIAGNOSIS — R0902 Hypoxemia: Secondary | ICD-10-CM | POA: Diagnosis not present

## 2020-11-17 DIAGNOSIS — I5030 Unspecified diastolic (congestive) heart failure: Secondary | ICD-10-CM | POA: Diagnosis not present

## 2020-11-17 DIAGNOSIS — R531 Weakness: Secondary | ICD-10-CM | POA: Diagnosis not present

## 2020-11-17 DIAGNOSIS — I130 Hypertensive heart and chronic kidney disease with heart failure and stage 1 through stage 4 chronic kidney disease, or unspecified chronic kidney disease: Secondary | ICD-10-CM | POA: Diagnosis not present

## 2020-11-17 DIAGNOSIS — N179 Acute kidney failure, unspecified: Secondary | ICD-10-CM | POA: Diagnosis not present

## 2020-11-18 DIAGNOSIS — R627 Adult failure to thrive: Secondary | ICD-10-CM | POA: Diagnosis not present

## 2020-11-18 DIAGNOSIS — I5032 Chronic diastolic (congestive) heart failure: Secondary | ICD-10-CM | POA: Diagnosis not present

## 2020-11-18 DIAGNOSIS — I10 Essential (primary) hypertension: Secondary | ICD-10-CM | POA: Diagnosis not present

## 2020-11-18 DIAGNOSIS — R41841 Cognitive communication deficit: Secondary | ICD-10-CM | POA: Diagnosis not present

## 2020-11-18 DIAGNOSIS — M6281 Muscle weakness (generalized): Secondary | ICD-10-CM | POA: Diagnosis not present

## 2020-11-18 DIAGNOSIS — I5030 Unspecified diastolic (congestive) heart failure: Secondary | ICD-10-CM | POA: Diagnosis not present

## 2020-11-18 DIAGNOSIS — K219 Gastro-esophageal reflux disease without esophagitis: Secondary | ICD-10-CM | POA: Diagnosis not present

## 2020-11-18 DIAGNOSIS — N179 Acute kidney failure, unspecified: Secondary | ICD-10-CM | POA: Diagnosis not present

## 2020-11-18 DIAGNOSIS — R296 Repeated falls: Secondary | ICD-10-CM | POA: Diagnosis not present

## 2020-11-18 DIAGNOSIS — E039 Hypothyroidism, unspecified: Secondary | ICD-10-CM | POA: Diagnosis not present

## 2020-11-18 DIAGNOSIS — N390 Urinary tract infection, site not specified: Secondary | ICD-10-CM | POA: Diagnosis not present

## 2020-11-18 DIAGNOSIS — R0902 Hypoxemia: Secondary | ICD-10-CM | POA: Diagnosis not present

## 2020-11-18 DIAGNOSIS — R404 Transient alteration of awareness: Secondary | ICD-10-CM | POA: Diagnosis not present

## 2020-11-18 DIAGNOSIS — R531 Weakness: Secondary | ICD-10-CM | POA: Diagnosis not present

## 2020-11-18 DIAGNOSIS — R630 Anorexia: Secondary | ICD-10-CM | POA: Diagnosis not present

## 2020-11-18 DIAGNOSIS — R410 Disorientation, unspecified: Secondary | ICD-10-CM | POA: Diagnosis not present

## 2020-11-18 DIAGNOSIS — R2689 Other abnormalities of gait and mobility: Secondary | ICD-10-CM | POA: Diagnosis not present

## 2020-11-18 DIAGNOSIS — R4182 Altered mental status, unspecified: Secondary | ICD-10-CM | POA: Diagnosis not present

## 2020-12-03 ENCOUNTER — Ambulatory Visit: Payer: Medicare Other | Admitting: Nurse Practitioner

## 2020-12-11 DIAGNOSIS — N390 Urinary tract infection, site not specified: Secondary | ICD-10-CM | POA: Diagnosis not present

## 2020-12-17 ENCOUNTER — Ambulatory Visit: Payer: Medicare Other | Admitting: Nurse Practitioner

## 2020-12-19 DIAGNOSIS — R0902 Hypoxemia: Secondary | ICD-10-CM | POA: Diagnosis not present

## 2020-12-19 DIAGNOSIS — R627 Adult failure to thrive: Secondary | ICD-10-CM | POA: Diagnosis not present

## 2020-12-19 DIAGNOSIS — R41841 Cognitive communication deficit: Secondary | ICD-10-CM | POA: Diagnosis not present

## 2020-12-19 DIAGNOSIS — R531 Weakness: Secondary | ICD-10-CM | POA: Diagnosis not present

## 2020-12-19 DIAGNOSIS — E039 Hypothyroidism, unspecified: Secondary | ICD-10-CM | POA: Diagnosis not present

## 2020-12-19 DIAGNOSIS — I5030 Unspecified diastolic (congestive) heart failure: Secondary | ICD-10-CM | POA: Diagnosis not present

## 2020-12-19 DIAGNOSIS — M6281 Muscle weakness (generalized): Secondary | ICD-10-CM | POA: Diagnosis not present

## 2020-12-19 DIAGNOSIS — R2689 Other abnormalities of gait and mobility: Secondary | ICD-10-CM | POA: Diagnosis not present

## 2020-12-19 DIAGNOSIS — I5032 Chronic diastolic (congestive) heart failure: Secondary | ICD-10-CM | POA: Diagnosis not present

## 2020-12-19 DIAGNOSIS — K219 Gastro-esophageal reflux disease without esophagitis: Secondary | ICD-10-CM | POA: Diagnosis not present

## 2020-12-19 DIAGNOSIS — N390 Urinary tract infection, site not specified: Secondary | ICD-10-CM | POA: Diagnosis not present

## 2020-12-19 DIAGNOSIS — I10 Essential (primary) hypertension: Secondary | ICD-10-CM | POA: Diagnosis not present

## 2020-12-19 DIAGNOSIS — R630 Anorexia: Secondary | ICD-10-CM | POA: Diagnosis not present

## 2020-12-19 DIAGNOSIS — R296 Repeated falls: Secondary | ICD-10-CM | POA: Diagnosis not present

## 2020-12-29 ENCOUNTER — Other Ambulatory Visit: Payer: Self-pay | Admitting: *Deleted

## 2020-12-29 NOTE — Patient Outreach (Signed)
[place] Coordinator follow up. Mrs. Delage remains in [facility].   Update received from [place] indicates member will likely transition home next weekend.   Will follow up for potential [facility] Care Management needs.    Marthenia Rolling, MSN, RN,BSN [place] Acute Care Coordinator [phone] [facility]) [phone]  (Toll free office)

## 2021-01-08 DIAGNOSIS — E039 Hypothyroidism, unspecified: Secondary | ICD-10-CM | POA: Diagnosis not present

## 2021-01-08 DIAGNOSIS — N390 Urinary tract infection, site not specified: Secondary | ICD-10-CM | POA: Diagnosis not present

## 2021-01-08 DIAGNOSIS — I10 Essential (primary) hypertension: Secondary | ICD-10-CM | POA: Diagnosis not present

## 2021-01-08 DIAGNOSIS — I5030 Unspecified diastolic (congestive) heart failure: Secondary | ICD-10-CM | POA: Diagnosis not present

## 2021-01-08 DIAGNOSIS — R531 Weakness: Secondary | ICD-10-CM | POA: Diagnosis not present

## 2021-01-11 DIAGNOSIS — Z9981 Dependence on supplemental oxygen: Secondary | ICD-10-CM | POA: Diagnosis not present

## 2021-01-11 DIAGNOSIS — I110 Hypertensive heart disease with heart failure: Secondary | ICD-10-CM | POA: Diagnosis not present

## 2021-01-11 DIAGNOSIS — Z7952 Long term (current) use of systemic steroids: Secondary | ICD-10-CM | POA: Diagnosis not present

## 2021-01-11 DIAGNOSIS — I5032 Chronic diastolic (congestive) heart failure: Secondary | ICD-10-CM | POA: Diagnosis not present

## 2021-01-11 DIAGNOSIS — Z7982 Long term (current) use of aspirin: Secondary | ICD-10-CM | POA: Diagnosis not present

## 2021-01-13 DIAGNOSIS — Z9981 Dependence on supplemental oxygen: Secondary | ICD-10-CM | POA: Diagnosis not present

## 2021-01-13 DIAGNOSIS — Z7952 Long term (current) use of systemic steroids: Secondary | ICD-10-CM | POA: Diagnosis not present

## 2021-01-13 DIAGNOSIS — Z7982 Long term (current) use of aspirin: Secondary | ICD-10-CM | POA: Diagnosis not present

## 2021-01-13 DIAGNOSIS — I5032 Chronic diastolic (congestive) heart failure: Secondary | ICD-10-CM | POA: Diagnosis not present

## 2021-01-13 DIAGNOSIS — I110 Hypertensive heart disease with heart failure: Secondary | ICD-10-CM | POA: Diagnosis not present

## 2021-01-15 DIAGNOSIS — Z7982 Long term (current) use of aspirin: Secondary | ICD-10-CM | POA: Diagnosis not present

## 2021-01-15 DIAGNOSIS — I110 Hypertensive heart disease with heart failure: Secondary | ICD-10-CM | POA: Diagnosis not present

## 2021-01-15 DIAGNOSIS — I5032 Chronic diastolic (congestive) heart failure: Secondary | ICD-10-CM | POA: Diagnosis not present

## 2021-01-15 DIAGNOSIS — Z7952 Long term (current) use of systemic steroids: Secondary | ICD-10-CM | POA: Diagnosis not present

## 2021-01-15 DIAGNOSIS — Z9981 Dependence on supplemental oxygen: Secondary | ICD-10-CM | POA: Diagnosis not present

## 2021-01-18 DIAGNOSIS — Z7952 Long term (current) use of systemic steroids: Secondary | ICD-10-CM | POA: Diagnosis not present

## 2021-01-18 DIAGNOSIS — I110 Hypertensive heart disease with heart failure: Secondary | ICD-10-CM | POA: Diagnosis not present

## 2021-01-18 DIAGNOSIS — Z9981 Dependence on supplemental oxygen: Secondary | ICD-10-CM | POA: Diagnosis not present

## 2021-01-18 DIAGNOSIS — I5032 Chronic diastolic (congestive) heart failure: Secondary | ICD-10-CM | POA: Diagnosis not present

## 2021-01-18 DIAGNOSIS — Z7982 Long term (current) use of aspirin: Secondary | ICD-10-CM | POA: Diagnosis not present

## 2021-01-19 ENCOUNTER — Other Ambulatory Visit: Payer: Self-pay | Admitting: *Deleted

## 2021-01-19 DIAGNOSIS — Z9981 Dependence on supplemental oxygen: Secondary | ICD-10-CM | POA: Diagnosis not present

## 2021-01-19 DIAGNOSIS — Z7952 Long term (current) use of systemic steroids: Secondary | ICD-10-CM | POA: Diagnosis not present

## 2021-01-19 DIAGNOSIS — I5032 Chronic diastolic (congestive) heart failure: Secondary | ICD-10-CM | POA: Diagnosis not present

## 2021-01-19 DIAGNOSIS — Z7982 Long term (current) use of aspirin: Secondary | ICD-10-CM | POA: Diagnosis not present

## 2021-01-19 DIAGNOSIS — I110 Hypertensive heart disease with heart failure: Secondary | ICD-10-CM | POA: Diagnosis not present

## 2021-01-19 NOTE — Patient Outreach (Signed)
[place] Coordinator follow up. Member screened for potential [facility] Care Management needs.  Verified in [place] (Patient [name]) that member transitioned home from [facility] on 01/08/21.   Telephone call made to [name] [phone]. Patient identifiers confirmed. [name] reports she is doing " a whole lot better" since she has been home from SNF. States her children are extremely supportive and are very attentive to all of her needs. States her daughter fills her pill box. States she is never alone. Has round the clock care. Has caregivers thru [facility]. Reports her PCP appointment is tomorrow at 1130. Has home health with agency in [place], [place]. [name] reports she is very pleased with her progress and the care she is receiving at home.   Denies having any [facility] Care Management needs at this time.   Expresses appreciation of the call.   Marthenia Rolling, MSN, RN,BSN [place] Acute Care Coordinator [phone] [facility]) [phone]  (Toll free office)

## 2021-01-22 DIAGNOSIS — Z9981 Dependence on supplemental oxygen: Secondary | ICD-10-CM | POA: Diagnosis not present

## 2021-01-22 DIAGNOSIS — Z7982 Long term (current) use of aspirin: Secondary | ICD-10-CM | POA: Diagnosis not present

## 2021-01-22 DIAGNOSIS — Z7952 Long term (current) use of systemic steroids: Secondary | ICD-10-CM | POA: Diagnosis not present

## 2021-01-22 DIAGNOSIS — I5032 Chronic diastolic (congestive) heart failure: Secondary | ICD-10-CM | POA: Diagnosis not present

## 2021-01-22 DIAGNOSIS — I110 Hypertensive heart disease with heart failure: Secondary | ICD-10-CM | POA: Diagnosis not present

## 2021-01-25 DIAGNOSIS — Z7952 Long term (current) use of systemic steroids: Secondary | ICD-10-CM | POA: Diagnosis not present

## 2021-01-25 DIAGNOSIS — I5032 Chronic diastolic (congestive) heart failure: Secondary | ICD-10-CM | POA: Diagnosis not present

## 2021-01-25 DIAGNOSIS — Z9981 Dependence on supplemental oxygen: Secondary | ICD-10-CM | POA: Diagnosis not present

## 2021-01-25 DIAGNOSIS — I110 Hypertensive heart disease with heart failure: Secondary | ICD-10-CM | POA: Diagnosis not present

## 2021-01-25 DIAGNOSIS — Z7982 Long term (current) use of aspirin: Secondary | ICD-10-CM | POA: Diagnosis not present

## 2021-01-27 DIAGNOSIS — Z7952 Long term (current) use of systemic steroids: Secondary | ICD-10-CM | POA: Diagnosis not present

## 2021-01-27 DIAGNOSIS — I110 Hypertensive heart disease with heart failure: Secondary | ICD-10-CM | POA: Diagnosis not present

## 2021-01-27 DIAGNOSIS — Z7982 Long term (current) use of aspirin: Secondary | ICD-10-CM | POA: Diagnosis not present

## 2021-01-27 DIAGNOSIS — Z9981 Dependence on supplemental oxygen: Secondary | ICD-10-CM | POA: Diagnosis not present

## 2021-01-27 DIAGNOSIS — I5032 Chronic diastolic (congestive) heart failure: Secondary | ICD-10-CM | POA: Diagnosis not present

## 2021-01-29 DIAGNOSIS — Z9981 Dependence on supplemental oxygen: Secondary | ICD-10-CM | POA: Diagnosis not present

## 2021-01-29 DIAGNOSIS — I5032 Chronic diastolic (congestive) heart failure: Secondary | ICD-10-CM | POA: Diagnosis not present

## 2021-01-29 DIAGNOSIS — I110 Hypertensive heart disease with heart failure: Secondary | ICD-10-CM | POA: Diagnosis not present

## 2021-01-29 DIAGNOSIS — Z7952 Long term (current) use of systemic steroids: Secondary | ICD-10-CM | POA: Diagnosis not present

## 2021-01-29 DIAGNOSIS — Z7982 Long term (current) use of aspirin: Secondary | ICD-10-CM | POA: Diagnosis not present

## 2021-02-01 DIAGNOSIS — Z7982 Long term (current) use of aspirin: Secondary | ICD-10-CM | POA: Diagnosis not present

## 2021-02-01 DIAGNOSIS — Z9981 Dependence on supplemental oxygen: Secondary | ICD-10-CM | POA: Diagnosis not present

## 2021-02-01 DIAGNOSIS — I110 Hypertensive heart disease with heart failure: Secondary | ICD-10-CM | POA: Diagnosis not present

## 2021-02-01 DIAGNOSIS — I5032 Chronic diastolic (congestive) heart failure: Secondary | ICD-10-CM | POA: Diagnosis not present

## 2021-02-01 DIAGNOSIS — Z7952 Long term (current) use of systemic steroids: Secondary | ICD-10-CM | POA: Diagnosis not present

## 2021-02-03 DIAGNOSIS — I110 Hypertensive heart disease with heart failure: Secondary | ICD-10-CM | POA: Diagnosis not present

## 2021-02-03 DIAGNOSIS — Z9981 Dependence on supplemental oxygen: Secondary | ICD-10-CM | POA: Diagnosis not present

## 2021-02-03 DIAGNOSIS — I5032 Chronic diastolic (congestive) heart failure: Secondary | ICD-10-CM | POA: Diagnosis not present

## 2021-02-03 DIAGNOSIS — Z7952 Long term (current) use of systemic steroids: Secondary | ICD-10-CM | POA: Diagnosis not present

## 2021-02-03 DIAGNOSIS — Z7982 Long term (current) use of aspirin: Secondary | ICD-10-CM | POA: Diagnosis not present

## 2021-02-05 DIAGNOSIS — Z7952 Long term (current) use of systemic steroids: Secondary | ICD-10-CM | POA: Diagnosis not present

## 2021-02-05 DIAGNOSIS — Z9981 Dependence on supplemental oxygen: Secondary | ICD-10-CM | POA: Diagnosis not present

## 2021-02-05 DIAGNOSIS — Z7982 Long term (current) use of aspirin: Secondary | ICD-10-CM | POA: Diagnosis not present

## 2021-02-05 DIAGNOSIS — I5032 Chronic diastolic (congestive) heart failure: Secondary | ICD-10-CM | POA: Diagnosis not present

## 2021-02-05 DIAGNOSIS — I110 Hypertensive heart disease with heart failure: Secondary | ICD-10-CM | POA: Diagnosis not present

## 2021-02-08 DIAGNOSIS — Z7952 Long term (current) use of systemic steroids: Secondary | ICD-10-CM | POA: Diagnosis not present

## 2021-02-08 DIAGNOSIS — E039 Hypothyroidism, unspecified: Secondary | ICD-10-CM | POA: Diagnosis not present

## 2021-02-08 DIAGNOSIS — K2100 Gastro-esophageal reflux disease with esophagitis, without bleeding: Secondary | ICD-10-CM | POA: Diagnosis not present

## 2021-02-08 DIAGNOSIS — R531 Weakness: Secondary | ICD-10-CM | POA: Diagnosis not present

## 2021-02-08 DIAGNOSIS — Z7982 Long term (current) use of aspirin: Secondary | ICD-10-CM | POA: Diagnosis not present

## 2021-02-08 DIAGNOSIS — Z9981 Dependence on supplemental oxygen: Secondary | ICD-10-CM | POA: Diagnosis not present

## 2021-02-08 DIAGNOSIS — I5032 Chronic diastolic (congestive) heart failure: Secondary | ICD-10-CM | POA: Diagnosis not present

## 2021-02-08 DIAGNOSIS — R569 Unspecified convulsions: Secondary | ICD-10-CM | POA: Diagnosis not present

## 2021-02-08 DIAGNOSIS — I110 Hypertensive heart disease with heart failure: Secondary | ICD-10-CM | POA: Diagnosis not present

## 2021-02-08 DIAGNOSIS — I5030 Unspecified diastolic (congestive) heart failure: Secondary | ICD-10-CM | POA: Diagnosis not present

## 2021-02-09 DIAGNOSIS — R5383 Other fatigue: Secondary | ICD-10-CM | POA: Diagnosis not present

## 2021-02-09 DIAGNOSIS — R350 Frequency of micturition: Secondary | ICD-10-CM | POA: Diagnosis not present

## 2021-02-09 DIAGNOSIS — I5030 Unspecified diastolic (congestive) heart failure: Secondary | ICD-10-CM | POA: Diagnosis not present

## 2021-02-09 DIAGNOSIS — R609 Edema, unspecified: Secondary | ICD-10-CM | POA: Diagnosis not present

## 2021-02-09 DIAGNOSIS — E559 Vitamin D deficiency, unspecified: Secondary | ICD-10-CM | POA: Diagnosis not present

## 2021-02-10 DIAGNOSIS — R531 Weakness: Secondary | ICD-10-CM | POA: Diagnosis not present

## 2021-02-10 DIAGNOSIS — N3000 Acute cystitis without hematuria: Secondary | ICD-10-CM | POA: Diagnosis not present

## 2021-02-10 DIAGNOSIS — N390 Urinary tract infection, site not specified: Secondary | ICD-10-CM | POA: Diagnosis not present

## 2021-02-10 DIAGNOSIS — M19071 Primary osteoarthritis, right ankle and foot: Secondary | ICD-10-CM | POA: Diagnosis present

## 2021-02-10 DIAGNOSIS — M6281 Muscle weakness (generalized): Secondary | ICD-10-CM | POA: Diagnosis not present

## 2021-02-10 DIAGNOSIS — Z7982 Long term (current) use of aspirin: Secondary | ICD-10-CM | POA: Diagnosis not present

## 2021-02-10 DIAGNOSIS — N289 Disorder of kidney and ureter, unspecified: Secondary | ICD-10-CM | POA: Diagnosis not present

## 2021-02-10 DIAGNOSIS — M19032 Primary osteoarthritis, left wrist: Secondary | ICD-10-CM | POA: Diagnosis not present

## 2021-02-10 DIAGNOSIS — Z881 Allergy status to other antibiotic agents status: Secondary | ICD-10-CM | POA: Diagnosis not present

## 2021-02-10 DIAGNOSIS — N189 Chronic kidney disease, unspecified: Secondary | ICD-10-CM | POA: Diagnosis present

## 2021-02-10 DIAGNOSIS — Z79899 Other long term (current) drug therapy: Secondary | ICD-10-CM | POA: Diagnosis not present

## 2021-02-10 DIAGNOSIS — B9689 Other specified bacterial agents as the cause of diseases classified elsewhere: Secondary | ICD-10-CM | POA: Diagnosis present

## 2021-02-10 DIAGNOSIS — M4854XA Collapsed vertebra, not elsewhere classified, thoracic region, initial encounter for fracture: Secondary | ICD-10-CM | POA: Diagnosis not present

## 2021-02-10 DIAGNOSIS — G40909 Epilepsy, unspecified, not intractable, without status epilepticus: Secondary | ICD-10-CM | POA: Diagnosis not present

## 2021-02-10 DIAGNOSIS — R1111 Vomiting without nausea: Secondary | ICD-10-CM | POA: Diagnosis not present

## 2021-02-10 DIAGNOSIS — Z880 Allergy status to penicillin: Secondary | ICD-10-CM | POA: Diagnosis not present

## 2021-02-10 DIAGNOSIS — D649 Anemia, unspecified: Secondary | ICD-10-CM | POA: Diagnosis present

## 2021-02-10 DIAGNOSIS — Z885 Allergy status to narcotic agent status: Secondary | ICD-10-CM | POA: Diagnosis not present

## 2021-02-10 DIAGNOSIS — R5381 Other malaise: Secondary | ICD-10-CM | POA: Diagnosis not present

## 2021-02-10 DIAGNOSIS — R0902 Hypoxemia: Secondary | ICD-10-CM | POA: Diagnosis not present

## 2021-02-10 DIAGNOSIS — M19072 Primary osteoarthritis, left ankle and foot: Secondary | ICD-10-CM | POA: Diagnosis present

## 2021-02-10 DIAGNOSIS — I081 Rheumatic disorders of both mitral and tricuspid valves: Secondary | ICD-10-CM | POA: Diagnosis not present

## 2021-02-10 DIAGNOSIS — J9691 Respiratory failure, unspecified with hypoxia: Secondary | ICD-10-CM | POA: Diagnosis not present

## 2021-02-10 DIAGNOSIS — Z87440 Personal history of urinary (tract) infections: Secondary | ICD-10-CM | POA: Diagnosis not present

## 2021-02-10 DIAGNOSIS — N183 Chronic kidney disease, stage 3 unspecified: Secondary | ICD-10-CM | POA: Diagnosis not present

## 2021-02-10 DIAGNOSIS — M19012 Primary osteoarthritis, left shoulder: Secondary | ICD-10-CM | POA: Diagnosis not present

## 2021-02-10 DIAGNOSIS — D631 Anemia in chronic kidney disease: Secondary | ICD-10-CM | POA: Diagnosis not present

## 2021-02-10 DIAGNOSIS — Z9981 Dependence on supplemental oxygen: Secondary | ICD-10-CM | POA: Diagnosis not present

## 2021-02-10 DIAGNOSIS — Z886 Allergy status to analgesic agent status: Secondary | ICD-10-CM | POA: Diagnosis not present

## 2021-02-10 DIAGNOSIS — E876 Hypokalemia: Secondary | ICD-10-CM | POA: Diagnosis not present

## 2021-02-10 DIAGNOSIS — I10 Essential (primary) hypertension: Secondary | ICD-10-CM | POA: Diagnosis not present

## 2021-02-10 DIAGNOSIS — K219 Gastro-esophageal reflux disease without esophagitis: Secondary | ICD-10-CM | POA: Diagnosis not present

## 2021-02-10 DIAGNOSIS — R262 Difficulty in walking, not elsewhere classified: Secondary | ICD-10-CM | POA: Diagnosis not present

## 2021-02-10 DIAGNOSIS — N3281 Overactive bladder: Secondary | ICD-10-CM | POA: Diagnosis present

## 2021-02-10 DIAGNOSIS — I5032 Chronic diastolic (congestive) heart failure: Secondary | ICD-10-CM | POA: Diagnosis not present

## 2021-02-10 DIAGNOSIS — N179 Acute kidney failure, unspecified: Secondary | ICD-10-CM | POA: Diagnosis present

## 2021-02-10 DIAGNOSIS — Z7401 Bed confinement status: Secondary | ICD-10-CM | POA: Diagnosis not present

## 2021-02-10 DIAGNOSIS — M19011 Primary osteoarthritis, right shoulder: Secondary | ICD-10-CM | POA: Diagnosis not present

## 2021-02-10 DIAGNOSIS — I110 Hypertensive heart disease with heart failure: Secondary | ICD-10-CM | POA: Diagnosis not present

## 2021-02-10 DIAGNOSIS — E860 Dehydration: Secondary | ICD-10-CM | POA: Diagnosis present

## 2021-02-10 DIAGNOSIS — E039 Hypothyroidism, unspecified: Secondary | ICD-10-CM | POA: Diagnosis not present

## 2021-02-10 DIAGNOSIS — I130 Hypertensive heart and chronic kidney disease with heart failure and stage 1 through stage 4 chronic kidney disease, or unspecified chronic kidney disease: Secondary | ICD-10-CM | POA: Diagnosis present

## 2021-02-10 DIAGNOSIS — I272 Pulmonary hypertension, unspecified: Secondary | ICD-10-CM | POA: Diagnosis not present

## 2021-02-10 DIAGNOSIS — I5030 Unspecified diastolic (congestive) heart failure: Secondary | ICD-10-CM | POA: Diagnosis not present

## 2021-02-10 DIAGNOSIS — Z91041 Radiographic dye allergy status: Secondary | ICD-10-CM | POA: Diagnosis not present

## 2021-02-10 DIAGNOSIS — Z792 Long term (current) use of antibiotics: Secondary | ICD-10-CM | POA: Diagnosis not present

## 2021-02-10 DIAGNOSIS — J42 Unspecified chronic bronchitis: Secondary | ICD-10-CM | POA: Diagnosis not present

## 2021-02-10 DIAGNOSIS — D72819 Decreased white blood cell count, unspecified: Secondary | ICD-10-CM | POA: Diagnosis not present

## 2021-02-10 DIAGNOSIS — Z20822 Contact with and (suspected) exposure to covid-19: Secondary | ICD-10-CM | POA: Diagnosis not present

## 2021-02-11 ENCOUNTER — Ambulatory Visit: Payer: Medicare Other | Admitting: Cardiology

## 2021-02-15 DIAGNOSIS — I130 Hypertensive heart and chronic kidney disease with heart failure and stage 1 through stage 4 chronic kidney disease, or unspecified chronic kidney disease: Secondary | ICD-10-CM | POA: Diagnosis not present

## 2021-02-15 DIAGNOSIS — G40909 Epilepsy, unspecified, not intractable, without status epilepticus: Secondary | ICD-10-CM | POA: Diagnosis not present

## 2021-02-15 DIAGNOSIS — I5030 Unspecified diastolic (congestive) heart failure: Secondary | ICD-10-CM | POA: Diagnosis not present

## 2021-02-15 DIAGNOSIS — N183 Chronic kidney disease, stage 3 unspecified: Secondary | ICD-10-CM | POA: Diagnosis not present

## 2021-02-19 DIAGNOSIS — N390 Urinary tract infection, site not specified: Secondary | ICD-10-CM | POA: Diagnosis not present

## 2021-02-19 DIAGNOSIS — J9691 Respiratory failure, unspecified with hypoxia: Secondary | ICD-10-CM | POA: Diagnosis not present

## 2021-02-19 DIAGNOSIS — R0902 Hypoxemia: Secondary | ICD-10-CM | POA: Diagnosis not present

## 2021-02-19 DIAGNOSIS — M792 Neuralgia and neuritis, unspecified: Secondary | ICD-10-CM | POA: Diagnosis not present

## 2021-02-19 DIAGNOSIS — I5032 Chronic diastolic (congestive) heart failure: Secondary | ICD-10-CM | POA: Diagnosis not present

## 2021-02-19 DIAGNOSIS — G40909 Epilepsy, unspecified, not intractable, without status epilepticus: Secondary | ICD-10-CM | POA: Diagnosis not present

## 2021-02-19 DIAGNOSIS — N3281 Overactive bladder: Secondary | ICD-10-CM | POA: Diagnosis not present

## 2021-02-19 DIAGNOSIS — N179 Acute kidney failure, unspecified: Secondary | ICD-10-CM | POA: Diagnosis not present

## 2021-02-19 DIAGNOSIS — I10 Essential (primary) hypertension: Secondary | ICD-10-CM | POA: Diagnosis not present

## 2021-02-19 DIAGNOSIS — B370 Candidal stomatitis: Secondary | ICD-10-CM | POA: Diagnosis not present

## 2021-02-19 DIAGNOSIS — Z7401 Bed confinement status: Secondary | ICD-10-CM | POA: Diagnosis not present

## 2021-02-19 DIAGNOSIS — I130 Hypertensive heart and chronic kidney disease with heart failure and stage 1 through stage 4 chronic kidney disease, or unspecified chronic kidney disease: Secondary | ICD-10-CM | POA: Diagnosis not present

## 2021-02-19 DIAGNOSIS — J42 Unspecified chronic bronchitis: Secondary | ICD-10-CM | POA: Diagnosis not present

## 2021-02-19 DIAGNOSIS — I7091 Generalized atherosclerosis: Secondary | ICD-10-CM | POA: Diagnosis not present

## 2021-02-19 DIAGNOSIS — L84 Corns and callosities: Secondary | ICD-10-CM | POA: Diagnosis not present

## 2021-02-19 DIAGNOSIS — R531 Weakness: Secondary | ICD-10-CM | POA: Diagnosis not present

## 2021-02-19 DIAGNOSIS — E876 Hypokalemia: Secondary | ICD-10-CM | POA: Diagnosis not present

## 2021-02-19 DIAGNOSIS — R600 Localized edema: Secondary | ICD-10-CM | POA: Diagnosis not present

## 2021-02-19 DIAGNOSIS — N3000 Acute cystitis without hematuria: Secondary | ICD-10-CM | POA: Diagnosis not present

## 2021-02-19 DIAGNOSIS — M6281 Muscle weakness (generalized): Secondary | ICD-10-CM | POA: Diagnosis not present

## 2021-02-19 DIAGNOSIS — I5030 Unspecified diastolic (congestive) heart failure: Secondary | ICD-10-CM | POA: Diagnosis not present

## 2021-02-19 DIAGNOSIS — I110 Hypertensive heart disease with heart failure: Secondary | ICD-10-CM | POA: Diagnosis not present

## 2021-02-19 DIAGNOSIS — N183 Chronic kidney disease, stage 3 unspecified: Secondary | ICD-10-CM | POA: Diagnosis not present

## 2021-02-19 DIAGNOSIS — R262 Difficulty in walking, not elsewhere classified: Secondary | ICD-10-CM | POA: Diagnosis not present

## 2021-02-19 DIAGNOSIS — Z87440 Personal history of urinary (tract) infections: Secondary | ICD-10-CM | POA: Diagnosis not present

## 2021-02-19 DIAGNOSIS — K219 Gastro-esophageal reflux disease without esophagitis: Secondary | ICD-10-CM | POA: Diagnosis not present

## 2021-02-19 DIAGNOSIS — N3001 Acute cystitis with hematuria: Secondary | ICD-10-CM | POA: Diagnosis not present

## 2021-02-19 DIAGNOSIS — B351 Tinea unguium: Secondary | ICD-10-CM | POA: Diagnosis not present

## 2021-02-19 DIAGNOSIS — E039 Hypothyroidism, unspecified: Secondary | ICD-10-CM | POA: Diagnosis not present

## 2021-02-22 DIAGNOSIS — I10 Essential (primary) hypertension: Secondary | ICD-10-CM | POA: Diagnosis not present

## 2021-02-22 DIAGNOSIS — E039 Hypothyroidism, unspecified: Secondary | ICD-10-CM | POA: Diagnosis not present

## 2021-02-22 DIAGNOSIS — R531 Weakness: Secondary | ICD-10-CM | POA: Diagnosis not present

## 2021-02-22 DIAGNOSIS — N179 Acute kidney failure, unspecified: Secondary | ICD-10-CM | POA: Diagnosis not present

## 2021-02-22 DIAGNOSIS — I5032 Chronic diastolic (congestive) heart failure: Secondary | ICD-10-CM | POA: Diagnosis not present

## 2021-02-22 DIAGNOSIS — N3001 Acute cystitis with hematuria: Secondary | ICD-10-CM | POA: Diagnosis not present

## 2021-03-08 DIAGNOSIS — B370 Candidal stomatitis: Secondary | ICD-10-CM | POA: Diagnosis not present

## 2021-03-16 DIAGNOSIS — I7091 Generalized atherosclerosis: Secondary | ICD-10-CM | POA: Diagnosis not present

## 2021-03-16 DIAGNOSIS — M792 Neuralgia and neuritis, unspecified: Secondary | ICD-10-CM | POA: Diagnosis not present

## 2021-03-16 DIAGNOSIS — L84 Corns and callosities: Secondary | ICD-10-CM | POA: Diagnosis not present

## 2021-03-16 DIAGNOSIS — R600 Localized edema: Secondary | ICD-10-CM | POA: Diagnosis not present

## 2021-03-16 DIAGNOSIS — B351 Tinea unguium: Secondary | ICD-10-CM | POA: Diagnosis not present

## 2021-03-17 DIAGNOSIS — N183 Chronic kidney disease, stage 3 unspecified: Secondary | ICD-10-CM | POA: Diagnosis not present

## 2021-03-17 DIAGNOSIS — I130 Hypertensive heart and chronic kidney disease with heart failure and stage 1 through stage 4 chronic kidney disease, or unspecified chronic kidney disease: Secondary | ICD-10-CM | POA: Diagnosis not present

## 2021-03-17 DIAGNOSIS — G40909 Epilepsy, unspecified, not intractable, without status epilepticus: Secondary | ICD-10-CM | POA: Diagnosis not present

## 2021-03-17 DIAGNOSIS — I5030 Unspecified diastolic (congestive) heart failure: Secondary | ICD-10-CM | POA: Diagnosis not present

## 2021-03-24 DIAGNOSIS — N390 Urinary tract infection, site not specified: Secondary | ICD-10-CM | POA: Diagnosis not present

## 2021-03-24 DIAGNOSIS — I10 Essential (primary) hypertension: Secondary | ICD-10-CM | POA: Diagnosis not present

## 2021-03-24 DIAGNOSIS — I5032 Chronic diastolic (congestive) heart failure: Secondary | ICD-10-CM | POA: Diagnosis not present

## 2021-03-24 DIAGNOSIS — R531 Weakness: Secondary | ICD-10-CM | POA: Diagnosis not present

## 2021-03-29 DIAGNOSIS — Z7982 Long term (current) use of aspirin: Secondary | ICD-10-CM | POA: Diagnosis not present

## 2021-03-29 DIAGNOSIS — I110 Hypertensive heart disease with heart failure: Secondary | ICD-10-CM | POA: Diagnosis not present

## 2021-03-29 DIAGNOSIS — Z9181 History of falling: Secondary | ICD-10-CM | POA: Diagnosis not present

## 2021-03-29 DIAGNOSIS — I5032 Chronic diastolic (congestive) heart failure: Secondary | ICD-10-CM | POA: Diagnosis not present

## 2021-03-29 DIAGNOSIS — Z9981 Dependence on supplemental oxygen: Secondary | ICD-10-CM | POA: Diagnosis not present

## 2021-03-30 DIAGNOSIS — Z7982 Long term (current) use of aspirin: Secondary | ICD-10-CM | POA: Diagnosis not present

## 2021-03-30 DIAGNOSIS — I110 Hypertensive heart disease with heart failure: Secondary | ICD-10-CM | POA: Diagnosis not present

## 2021-03-30 DIAGNOSIS — Z9181 History of falling: Secondary | ICD-10-CM | POA: Diagnosis not present

## 2021-03-30 DIAGNOSIS — I5032 Chronic diastolic (congestive) heart failure: Secondary | ICD-10-CM | POA: Diagnosis not present

## 2021-03-30 DIAGNOSIS — Z9981 Dependence on supplemental oxygen: Secondary | ICD-10-CM | POA: Diagnosis not present

## 2021-03-31 DIAGNOSIS — E039 Hypothyroidism, unspecified: Secondary | ICD-10-CM | POA: Diagnosis not present

## 2021-03-31 DIAGNOSIS — R5383 Other fatigue: Secondary | ICD-10-CM | POA: Diagnosis not present

## 2021-03-31 DIAGNOSIS — D649 Anemia, unspecified: Secondary | ICD-10-CM | POA: Diagnosis not present

## 2021-03-31 DIAGNOSIS — R300 Dysuria: Secondary | ICD-10-CM | POA: Diagnosis not present

## 2021-04-01 DIAGNOSIS — I110 Hypertensive heart disease with heart failure: Secondary | ICD-10-CM | POA: Diagnosis not present

## 2021-04-01 DIAGNOSIS — Z7982 Long term (current) use of aspirin: Secondary | ICD-10-CM | POA: Diagnosis not present

## 2021-04-01 DIAGNOSIS — Z9981 Dependence on supplemental oxygen: Secondary | ICD-10-CM | POA: Diagnosis not present

## 2021-04-01 DIAGNOSIS — Z9181 History of falling: Secondary | ICD-10-CM | POA: Diagnosis not present

## 2021-04-01 DIAGNOSIS — I5032 Chronic diastolic (congestive) heart failure: Secondary | ICD-10-CM | POA: Diagnosis not present

## 2021-04-06 DIAGNOSIS — Z7982 Long term (current) use of aspirin: Secondary | ICD-10-CM | POA: Diagnosis not present

## 2021-04-06 DIAGNOSIS — I110 Hypertensive heart disease with heart failure: Secondary | ICD-10-CM | POA: Diagnosis not present

## 2021-04-06 DIAGNOSIS — Z9181 History of falling: Secondary | ICD-10-CM | POA: Diagnosis not present

## 2021-04-06 DIAGNOSIS — I5032 Chronic diastolic (congestive) heart failure: Secondary | ICD-10-CM | POA: Diagnosis not present

## 2021-04-06 DIAGNOSIS — Z9981 Dependence on supplemental oxygen: Secondary | ICD-10-CM | POA: Diagnosis not present

## 2021-04-14 DIAGNOSIS — Z9981 Dependence on supplemental oxygen: Secondary | ICD-10-CM | POA: Diagnosis not present

## 2021-04-14 DIAGNOSIS — I110 Hypertensive heart disease with heart failure: Secondary | ICD-10-CM | POA: Diagnosis not present

## 2021-04-14 DIAGNOSIS — Z9181 History of falling: Secondary | ICD-10-CM | POA: Diagnosis not present

## 2021-04-14 DIAGNOSIS — Z7982 Long term (current) use of aspirin: Secondary | ICD-10-CM | POA: Diagnosis not present

## 2021-04-14 DIAGNOSIS — I5032 Chronic diastolic (congestive) heart failure: Secondary | ICD-10-CM | POA: Diagnosis not present

## 2021-04-20 DIAGNOSIS — Z9181 History of falling: Secondary | ICD-10-CM | POA: Diagnosis not present

## 2021-04-20 DIAGNOSIS — Z9981 Dependence on supplemental oxygen: Secondary | ICD-10-CM | POA: Diagnosis not present

## 2021-04-20 DIAGNOSIS — I110 Hypertensive heart disease with heart failure: Secondary | ICD-10-CM | POA: Diagnosis not present

## 2021-04-20 DIAGNOSIS — I5032 Chronic diastolic (congestive) heart failure: Secondary | ICD-10-CM | POA: Diagnosis not present

## 2021-04-20 DIAGNOSIS — Z7982 Long term (current) use of aspirin: Secondary | ICD-10-CM | POA: Diagnosis not present

## 2021-04-26 DIAGNOSIS — S61412A Laceration without foreign body of left hand, initial encounter: Secondary | ICD-10-CM | POA: Diagnosis not present

## 2021-04-26 DIAGNOSIS — M160 Bilateral primary osteoarthritis of hip: Secondary | ICD-10-CM | POA: Diagnosis present

## 2021-04-26 DIAGNOSIS — N3000 Acute cystitis without hematuria: Secondary | ICD-10-CM | POA: Diagnosis not present

## 2021-04-26 DIAGNOSIS — R4182 Altered mental status, unspecified: Secondary | ICD-10-CM | POA: Diagnosis not present

## 2021-04-26 DIAGNOSIS — M1990 Unspecified osteoarthritis, unspecified site: Secondary | ICD-10-CM | POA: Diagnosis not present

## 2021-04-26 DIAGNOSIS — D649 Anemia, unspecified: Secondary | ICD-10-CM | POA: Diagnosis not present

## 2021-04-26 DIAGNOSIS — I10 Essential (primary) hypertension: Secondary | ICD-10-CM | POA: Diagnosis not present

## 2021-04-26 DIAGNOSIS — N3281 Overactive bladder: Secondary | ICD-10-CM | POA: Diagnosis not present

## 2021-04-26 DIAGNOSIS — R300 Dysuria: Secondary | ICD-10-CM | POA: Diagnosis not present

## 2021-04-26 DIAGNOSIS — Z881 Allergy status to other antibiotic agents status: Secondary | ICD-10-CM | POA: Diagnosis not present

## 2021-04-26 DIAGNOSIS — Z862 Personal history of diseases of the blood and blood-forming organs and certain disorders involving the immune mechanism: Secondary | ICD-10-CM | POA: Diagnosis not present

## 2021-04-26 DIAGNOSIS — M25572 Pain in left ankle and joints of left foot: Secondary | ICD-10-CM | POA: Diagnosis not present

## 2021-04-26 DIAGNOSIS — E039 Hypothyroidism, unspecified: Secondary | ICD-10-CM | POA: Diagnosis present

## 2021-04-26 DIAGNOSIS — Z8639 Personal history of other endocrine, nutritional and metabolic disease: Secondary | ICD-10-CM | POA: Diagnosis not present

## 2021-04-26 DIAGNOSIS — M1710 Unilateral primary osteoarthritis, unspecified knee: Secondary | ICD-10-CM | POA: Diagnosis not present

## 2021-04-26 DIAGNOSIS — R918 Other nonspecific abnormal finding of lung field: Secondary | ICD-10-CM | POA: Diagnosis not present

## 2021-04-26 DIAGNOSIS — J9611 Chronic respiratory failure with hypoxia: Secondary | ICD-10-CM | POA: Diagnosis present

## 2021-04-26 DIAGNOSIS — N182 Chronic kidney disease, stage 2 (mild): Secondary | ICD-10-CM | POA: Diagnosis present

## 2021-04-26 DIAGNOSIS — M19072 Primary osteoarthritis, left ankle and foot: Secondary | ICD-10-CM | POA: Diagnosis not present

## 2021-04-26 DIAGNOSIS — G8929 Other chronic pain: Secondary | ICD-10-CM | POA: Diagnosis present

## 2021-04-26 DIAGNOSIS — R262 Difficulty in walking, not elsewhere classified: Secondary | ICD-10-CM | POA: Diagnosis not present

## 2021-04-26 DIAGNOSIS — Z20822 Contact with and (suspected) exposure to covid-19: Secondary | ICD-10-CM | POA: Diagnosis present

## 2021-04-26 DIAGNOSIS — D509 Iron deficiency anemia, unspecified: Secondary | ICD-10-CM | POA: Diagnosis present

## 2021-04-26 DIAGNOSIS — J9811 Atelectasis: Secondary | ICD-10-CM | POA: Diagnosis present

## 2021-04-26 DIAGNOSIS — Z7982 Long term (current) use of aspirin: Secondary | ICD-10-CM | POA: Diagnosis not present

## 2021-04-26 DIAGNOSIS — Z79899 Other long term (current) drug therapy: Secondary | ICD-10-CM | POA: Diagnosis not present

## 2021-04-26 DIAGNOSIS — K219 Gastro-esophageal reflux disease without esophagitis: Secondary | ICD-10-CM | POA: Diagnosis not present

## 2021-04-26 DIAGNOSIS — M1610 Unilateral primary osteoarthritis, unspecified hip: Secondary | ICD-10-CM | POA: Diagnosis not present

## 2021-04-26 DIAGNOSIS — M1611 Unilateral primary osteoarthritis, right hip: Secondary | ICD-10-CM | POA: Diagnosis not present

## 2021-04-26 DIAGNOSIS — M545 Low back pain, unspecified: Secondary | ICD-10-CM | POA: Diagnosis not present

## 2021-04-26 DIAGNOSIS — M6281 Muscle weakness (generalized): Secondary | ICD-10-CM | POA: Diagnosis not present

## 2021-04-26 DIAGNOSIS — J42 Unspecified chronic bronchitis: Secondary | ICD-10-CM | POA: Diagnosis not present

## 2021-04-26 DIAGNOSIS — Z66 Do not resuscitate: Secondary | ICD-10-CM | POA: Diagnosis present

## 2021-04-26 DIAGNOSIS — M170 Bilateral primary osteoarthritis of knee: Secondary | ICD-10-CM | POA: Diagnosis not present

## 2021-04-26 DIAGNOSIS — B9620 Unspecified Escherichia coli [E. coli] as the cause of diseases classified elsewhere: Secondary | ICD-10-CM | POA: Diagnosis present

## 2021-04-26 DIAGNOSIS — R531 Weakness: Secondary | ICD-10-CM | POA: Diagnosis not present

## 2021-04-26 DIAGNOSIS — N390 Urinary tract infection, site not specified: Secondary | ICD-10-CM | POA: Diagnosis not present

## 2021-04-26 DIAGNOSIS — E876 Hypokalemia: Secondary | ICD-10-CM | POA: Diagnosis not present

## 2021-04-26 DIAGNOSIS — M169 Osteoarthritis of hip, unspecified: Secondary | ICD-10-CM | POA: Diagnosis not present

## 2021-04-26 DIAGNOSIS — I5032 Chronic diastolic (congestive) heart failure: Secondary | ICD-10-CM | POA: Diagnosis present

## 2021-04-26 DIAGNOSIS — Z9981 Dependence on supplemental oxygen: Secondary | ICD-10-CM | POA: Diagnosis not present

## 2021-04-26 DIAGNOSIS — Z9181 History of falling: Secondary | ICD-10-CM | POA: Diagnosis not present

## 2021-04-26 DIAGNOSIS — R102 Pelvic and perineal pain: Secondary | ICD-10-CM | POA: Diagnosis not present

## 2021-04-26 DIAGNOSIS — Z1612 Extended spectrum beta lactamase (ESBL) resistance: Secondary | ICD-10-CM | POA: Diagnosis not present

## 2021-04-26 DIAGNOSIS — R296 Repeated falls: Secondary | ICD-10-CM | POA: Diagnosis not present

## 2021-04-26 DIAGNOSIS — M25559 Pain in unspecified hip: Secondary | ICD-10-CM | POA: Diagnosis not present

## 2021-04-26 DIAGNOSIS — Z792 Long term (current) use of antibiotics: Secondary | ICD-10-CM | POA: Diagnosis not present

## 2021-04-26 DIAGNOSIS — R5383 Other fatigue: Secondary | ICD-10-CM | POA: Diagnosis not present

## 2021-05-03 DIAGNOSIS — E039 Hypothyroidism, unspecified: Secondary | ICD-10-CM | POA: Diagnosis not present

## 2021-05-03 DIAGNOSIS — N3000 Acute cystitis without hematuria: Secondary | ICD-10-CM | POA: Diagnosis not present

## 2021-05-03 DIAGNOSIS — M160 Bilateral primary osteoarthritis of hip: Secondary | ICD-10-CM | POA: Diagnosis not present

## 2021-05-03 DIAGNOSIS — G44209 Tension-type headache, unspecified, not intractable: Secondary | ICD-10-CM | POA: Diagnosis not present

## 2021-05-03 DIAGNOSIS — Z9181 History of falling: Secondary | ICD-10-CM | POA: Diagnosis not present

## 2021-05-03 DIAGNOSIS — J9611 Chronic respiratory failure with hypoxia: Secondary | ICD-10-CM | POA: Diagnosis not present

## 2021-05-03 DIAGNOSIS — E876 Hypokalemia: Secondary | ICD-10-CM | POA: Diagnosis not present

## 2021-05-03 DIAGNOSIS — B351 Tinea unguium: Secondary | ICD-10-CM | POA: Diagnosis not present

## 2021-05-03 DIAGNOSIS — I5032 Chronic diastolic (congestive) heart failure: Secondary | ICD-10-CM | POA: Diagnosis not present

## 2021-05-03 DIAGNOSIS — N390 Urinary tract infection, site not specified: Secondary | ICD-10-CM | POA: Diagnosis not present

## 2021-05-03 DIAGNOSIS — R262 Difficulty in walking, not elsewhere classified: Secondary | ICD-10-CM | POA: Diagnosis not present

## 2021-05-03 DIAGNOSIS — R531 Weakness: Secondary | ICD-10-CM | POA: Diagnosis not present

## 2021-05-03 DIAGNOSIS — Z1612 Extended spectrum beta lactamase (ESBL) resistance: Secondary | ICD-10-CM | POA: Diagnosis not present

## 2021-05-03 DIAGNOSIS — J42 Unspecified chronic bronchitis: Secondary | ICD-10-CM | POA: Diagnosis not present

## 2021-05-03 DIAGNOSIS — I10 Essential (primary) hypertension: Secondary | ICD-10-CM | POA: Diagnosis not present

## 2021-05-03 DIAGNOSIS — K219 Gastro-esophageal reflux disease without esophagitis: Secondary | ICD-10-CM | POA: Diagnosis not present

## 2021-05-03 DIAGNOSIS — N3281 Overactive bladder: Secondary | ICD-10-CM | POA: Diagnosis not present

## 2021-05-03 DIAGNOSIS — R296 Repeated falls: Secondary | ICD-10-CM | POA: Diagnosis not present

## 2021-05-03 DIAGNOSIS — D649 Anemia, unspecified: Secondary | ICD-10-CM | POA: Diagnosis not present

## 2021-05-03 DIAGNOSIS — G8929 Other chronic pain: Secondary | ICD-10-CM | POA: Diagnosis not present

## 2021-05-03 DIAGNOSIS — R300 Dysuria: Secondary | ICD-10-CM | POA: Diagnosis not present

## 2021-05-03 DIAGNOSIS — I7091 Generalized atherosclerosis: Secondary | ICD-10-CM | POA: Diagnosis not present

## 2021-05-03 DIAGNOSIS — M6281 Muscle weakness (generalized): Secondary | ICD-10-CM | POA: Diagnosis not present

## 2021-05-06 DIAGNOSIS — I5032 Chronic diastolic (congestive) heart failure: Secondary | ICD-10-CM | POA: Diagnosis not present

## 2021-05-06 DIAGNOSIS — R531 Weakness: Secondary | ICD-10-CM | POA: Diagnosis not present

## 2021-05-06 DIAGNOSIS — R296 Repeated falls: Secondary | ICD-10-CM | POA: Diagnosis not present

## 2021-05-06 DIAGNOSIS — K219 Gastro-esophageal reflux disease without esophagitis: Secondary | ICD-10-CM | POA: Diagnosis not present

## 2021-05-06 DIAGNOSIS — I10 Essential (primary) hypertension: Secondary | ICD-10-CM | POA: Diagnosis not present

## 2021-05-06 DIAGNOSIS — N390 Urinary tract infection, site not specified: Secondary | ICD-10-CM | POA: Diagnosis not present

## 2021-05-06 DIAGNOSIS — E039 Hypothyroidism, unspecified: Secondary | ICD-10-CM | POA: Diagnosis not present

## 2021-05-27 ENCOUNTER — Other Ambulatory Visit: Payer: Self-pay | Admitting: *Deleted

## 2021-05-27 DIAGNOSIS — R300 Dysuria: Secondary | ICD-10-CM | POA: Diagnosis not present

## 2021-05-27 DIAGNOSIS — B351 Tinea unguium: Secondary | ICD-10-CM | POA: Diagnosis not present

## 2021-05-27 DIAGNOSIS — G44209 Tension-type headache, unspecified, not intractable: Secondary | ICD-10-CM | POA: Diagnosis not present

## 2021-05-27 DIAGNOSIS — I7091 Generalized atherosclerosis: Secondary | ICD-10-CM | POA: Diagnosis not present

## 2021-05-27 NOTE — Patient Outreach (Addendum)
Member screened for potential [facility] Care Management needs.   According to [facility] (Patient [name]) member resides in [facility].   Communication sent to facility SW to inquire about transition plans.  Addendum: update received from SNF SW reporting member will return home. Has caregiver assistance Monday- Friday 8 am to 4 pm and children alternate staying with her at night and on weekends.   Will plan outreach as appropriate to discuss potential [facility] Care Management needs.   Will continue to follow.    Marthenia Rolling, MSN, RN,BSN [place] Acute Care Coordinator [phone] [facility]) [phone]  (Toll free office)

## 2021-06-11 DIAGNOSIS — D649 Anemia, unspecified: Secondary | ICD-10-CM | POA: Diagnosis not present

## 2021-06-11 DIAGNOSIS — R569 Unspecified convulsions: Secondary | ICD-10-CM | POA: Diagnosis not present

## 2021-06-11 DIAGNOSIS — S32009A Unspecified fracture of unspecified lumbar vertebra, initial encounter for closed fracture: Secondary | ICD-10-CM | POA: Diagnosis not present

## 2021-06-11 DIAGNOSIS — R634 Abnormal weight loss: Secondary | ICD-10-CM | POA: Diagnosis not present

## 2021-06-11 DIAGNOSIS — R5383 Other fatigue: Secondary | ICD-10-CM | POA: Diagnosis not present

## 2021-06-11 DIAGNOSIS — R300 Dysuria: Secondary | ICD-10-CM | POA: Diagnosis not present

## 2021-06-11 DIAGNOSIS — R519 Headache, unspecified: Secondary | ICD-10-CM | POA: Diagnosis not present

## 2021-06-11 DIAGNOSIS — D519 Vitamin B12 deficiency anemia, unspecified: Secondary | ICD-10-CM | POA: Diagnosis not present

## 2021-06-11 DIAGNOSIS — E611 Iron deficiency: Secondary | ICD-10-CM | POA: Diagnosis not present

## 2021-06-11 DIAGNOSIS — D529 Folate deficiency anemia, unspecified: Secondary | ICD-10-CM | POA: Diagnosis not present

## 2021-06-11 DIAGNOSIS — D508 Other iron deficiency anemias: Secondary | ICD-10-CM | POA: Diagnosis not present

## 2021-06-11 DIAGNOSIS — I5030 Unspecified diastolic (congestive) heart failure: Secondary | ICD-10-CM | POA: Diagnosis not present

## 2021-06-11 DIAGNOSIS — N183 Chronic kidney disease, stage 3 unspecified: Secondary | ICD-10-CM | POA: Diagnosis not present

## 2021-06-11 DIAGNOSIS — R630 Anorexia: Secondary | ICD-10-CM | POA: Diagnosis not present

## 2021-06-17 DIAGNOSIS — I130 Hypertensive heart and chronic kidney disease with heart failure and stage 1 through stage 4 chronic kidney disease, or unspecified chronic kidney disease: Secondary | ICD-10-CM | POA: Diagnosis not present

## 2021-06-17 DIAGNOSIS — G40909 Epilepsy, unspecified, not intractable, without status epilepticus: Secondary | ICD-10-CM | POA: Diagnosis not present

## 2021-06-17 DIAGNOSIS — N183 Chronic kidney disease, stage 3 unspecified: Secondary | ICD-10-CM | POA: Diagnosis not present

## 2021-06-17 DIAGNOSIS — I5030 Unspecified diastolic (congestive) heart failure: Secondary | ICD-10-CM | POA: Diagnosis not present

## 2021-06-18 DIAGNOSIS — E8809 Other disorders of plasma-protein metabolism, not elsewhere classified: Secondary | ICD-10-CM | POA: Diagnosis present

## 2021-06-18 DIAGNOSIS — Z1612 Extended spectrum beta lactamase (ESBL) resistance: Secondary | ICD-10-CM | POA: Diagnosis not present

## 2021-06-18 DIAGNOSIS — N289 Disorder of kidney and ureter, unspecified: Secondary | ICD-10-CM | POA: Diagnosis not present

## 2021-06-18 DIAGNOSIS — H9190 Unspecified hearing loss, unspecified ear: Secondary | ICD-10-CM | POA: Diagnosis present

## 2021-06-18 DIAGNOSIS — I272 Pulmonary hypertension, unspecified: Secondary | ICD-10-CM | POA: Diagnosis present

## 2021-06-18 DIAGNOSIS — E876 Hypokalemia: Secondary | ICD-10-CM | POA: Diagnosis present

## 2021-06-18 DIAGNOSIS — Z880 Allergy status to penicillin: Secondary | ICD-10-CM | POA: Diagnosis not present

## 2021-06-18 DIAGNOSIS — I110 Hypertensive heart disease with heart failure: Secondary | ICD-10-CM | POA: Diagnosis present

## 2021-06-18 DIAGNOSIS — R0902 Hypoxemia: Secondary | ICD-10-CM | POA: Diagnosis not present

## 2021-06-18 DIAGNOSIS — J1282 Pneumonia due to coronavirus disease 2019: Secondary | ICD-10-CM | POA: Diagnosis not present

## 2021-06-18 DIAGNOSIS — B9689 Other specified bacterial agents as the cause of diseases classified elsewhere: Secondary | ICD-10-CM | POA: Diagnosis not present

## 2021-06-18 DIAGNOSIS — Z7901 Long term (current) use of anticoagulants: Secondary | ICD-10-CM | POA: Diagnosis not present

## 2021-06-18 DIAGNOSIS — R627 Adult failure to thrive: Secondary | ICD-10-CM | POA: Diagnosis present

## 2021-06-18 DIAGNOSIS — I5032 Chronic diastolic (congestive) heart failure: Secondary | ICD-10-CM | POA: Diagnosis not present

## 2021-06-18 DIAGNOSIS — Z79899 Other long term (current) drug therapy: Secondary | ICD-10-CM | POA: Diagnosis not present

## 2021-06-18 DIAGNOSIS — Z91041 Radiographic dye allergy status: Secondary | ICD-10-CM | POA: Diagnosis not present

## 2021-06-18 DIAGNOSIS — R059 Cough, unspecified: Secondary | ICD-10-CM | POA: Diagnosis not present

## 2021-06-18 DIAGNOSIS — R5381 Other malaise: Secondary | ICD-10-CM | POA: Diagnosis not present

## 2021-06-18 DIAGNOSIS — R410 Disorientation, unspecified: Secondary | ICD-10-CM | POA: Diagnosis not present

## 2021-06-18 DIAGNOSIS — D61818 Other pancytopenia: Secondary | ICD-10-CM | POA: Diagnosis not present

## 2021-06-18 DIAGNOSIS — R531 Weakness: Secondary | ICD-10-CM | POA: Diagnosis not present

## 2021-06-18 DIAGNOSIS — Z1624 Resistance to multiple antibiotics: Secondary | ICD-10-CM | POA: Diagnosis present

## 2021-06-18 DIAGNOSIS — R918 Other nonspecific abnormal finding of lung field: Secondary | ICD-10-CM | POA: Diagnosis not present

## 2021-06-18 DIAGNOSIS — R269 Unspecified abnormalities of gait and mobility: Secondary | ICD-10-CM | POA: Diagnosis not present

## 2021-06-18 DIAGNOSIS — E039 Hypothyroidism, unspecified: Secondary | ICD-10-CM | POA: Diagnosis present

## 2021-06-18 DIAGNOSIS — R279 Unspecified lack of coordination: Secondary | ICD-10-CM | POA: Diagnosis not present

## 2021-06-18 DIAGNOSIS — R400 Somnolence: Secondary | ICD-10-CM | POA: Diagnosis not present

## 2021-06-18 DIAGNOSIS — N3001 Acute cystitis with hematuria: Secondary | ICD-10-CM | POA: Diagnosis not present

## 2021-06-18 DIAGNOSIS — E2740 Unspecified adrenocortical insufficiency: Secondary | ICD-10-CM | POA: Diagnosis not present

## 2021-06-18 DIAGNOSIS — Z8639 Personal history of other endocrine, nutritional and metabolic disease: Secondary | ICD-10-CM | POA: Diagnosis not present

## 2021-06-18 DIAGNOSIS — U071 COVID-19: Secondary | ICD-10-CM | POA: Diagnosis not present

## 2021-06-18 DIAGNOSIS — E2749 Other adrenocortical insufficiency: Secondary | ICD-10-CM | POA: Diagnosis not present

## 2021-06-18 DIAGNOSIS — Z79890 Hormone replacement therapy (postmenopausal): Secondary | ICD-10-CM | POA: Diagnosis not present

## 2021-06-18 DIAGNOSIS — Z7401 Bed confinement status: Secondary | ICD-10-CM | POA: Diagnosis not present

## 2021-06-18 DIAGNOSIS — N179 Acute kidney failure, unspecified: Secondary | ICD-10-CM | POA: Diagnosis not present

## 2021-06-18 DIAGNOSIS — R4182 Altered mental status, unspecified: Secondary | ICD-10-CM | POA: Diagnosis not present

## 2021-06-18 DIAGNOSIS — E860 Dehydration: Secondary | ICD-10-CM | POA: Diagnosis present

## 2021-06-18 DIAGNOSIS — I5189 Other ill-defined heart diseases: Secondary | ICD-10-CM | POA: Diagnosis not present

## 2021-06-18 DIAGNOSIS — N3000 Acute cystitis without hematuria: Secondary | ICD-10-CM | POA: Diagnosis not present

## 2021-06-18 DIAGNOSIS — Z7982 Long term (current) use of aspirin: Secondary | ICD-10-CM | POA: Diagnosis not present

## 2021-06-18 DIAGNOSIS — D649 Anemia, unspecified: Secondary | ICD-10-CM | POA: Diagnosis not present

## 2021-06-18 DIAGNOSIS — Z9981 Dependence on supplemental oxygen: Secondary | ICD-10-CM | POA: Diagnosis not present

## 2021-06-18 DIAGNOSIS — M19049 Primary osteoarthritis, unspecified hand: Secondary | ICD-10-CM | POA: Diagnosis present

## 2021-06-18 DIAGNOSIS — I10 Essential (primary) hypertension: Secondary | ICD-10-CM | POA: Diagnosis not present

## 2021-06-18 DIAGNOSIS — I959 Hypotension, unspecified: Secondary | ICD-10-CM | POA: Diagnosis not present

## 2021-06-18 DIAGNOSIS — B9620 Unspecified Escherichia coli [E. coli] as the cause of diseases classified elsewhere: Secondary | ICD-10-CM | POA: Diagnosis present

## 2021-06-18 DIAGNOSIS — G629 Polyneuropathy, unspecified: Secondary | ICD-10-CM | POA: Diagnosis present

## 2021-06-19 DIAGNOSIS — I5189 Other ill-defined heart diseases: Secondary | ICD-10-CM | POA: Diagnosis not present

## 2021-06-19 DIAGNOSIS — N289 Disorder of kidney and ureter, unspecified: Secondary | ICD-10-CM | POA: Diagnosis not present

## 2021-06-19 DIAGNOSIS — R410 Disorientation, unspecified: Secondary | ICD-10-CM | POA: Diagnosis not present

## 2021-06-19 DIAGNOSIS — E039 Hypothyroidism, unspecified: Secondary | ICD-10-CM | POA: Diagnosis not present

## 2021-06-19 DIAGNOSIS — U071 COVID-19: Secondary | ICD-10-CM | POA: Diagnosis not present

## 2021-06-19 DIAGNOSIS — Z79890 Hormone replacement therapy (postmenopausal): Secondary | ICD-10-CM | POA: Diagnosis not present

## 2021-06-19 DIAGNOSIS — Z8639 Personal history of other endocrine, nutritional and metabolic disease: Secondary | ICD-10-CM | POA: Diagnosis not present

## 2021-06-19 DIAGNOSIS — E876 Hypokalemia: Secondary | ICD-10-CM | POA: Diagnosis not present

## 2021-06-19 DIAGNOSIS — N3000 Acute cystitis without hematuria: Secondary | ICD-10-CM | POA: Diagnosis not present

## 2021-06-19 DIAGNOSIS — Z9981 Dependence on supplemental oxygen: Secondary | ICD-10-CM | POA: Diagnosis not present

## 2021-06-19 DIAGNOSIS — D649 Anemia, unspecified: Secondary | ICD-10-CM | POA: Diagnosis not present

## 2021-06-19 DIAGNOSIS — Z1624 Resistance to multiple antibiotics: Secondary | ICD-10-CM | POA: Diagnosis not present

## 2021-06-20 DIAGNOSIS — B9620 Unspecified Escherichia coli [E. coli] as the cause of diseases classified elsewhere: Secondary | ICD-10-CM | POA: Diagnosis not present

## 2021-06-20 DIAGNOSIS — Z9981 Dependence on supplemental oxygen: Secondary | ICD-10-CM | POA: Diagnosis not present

## 2021-06-20 DIAGNOSIS — U071 COVID-19: Secondary | ICD-10-CM | POA: Diagnosis not present

## 2021-06-20 DIAGNOSIS — D649 Anemia, unspecified: Secondary | ICD-10-CM | POA: Diagnosis not present

## 2021-06-20 DIAGNOSIS — N3000 Acute cystitis without hematuria: Secondary | ICD-10-CM | POA: Diagnosis not present

## 2021-06-20 DIAGNOSIS — Z1624 Resistance to multiple antibiotics: Secondary | ICD-10-CM | POA: Diagnosis not present

## 2021-06-20 DIAGNOSIS — E2749 Other adrenocortical insufficiency: Secondary | ICD-10-CM | POA: Diagnosis not present

## 2021-06-20 DIAGNOSIS — I5189 Other ill-defined heart diseases: Secondary | ICD-10-CM | POA: Diagnosis not present

## 2021-06-20 DIAGNOSIS — Z79890 Hormone replacement therapy (postmenopausal): Secondary | ICD-10-CM | POA: Diagnosis not present

## 2021-06-20 DIAGNOSIS — Z8639 Personal history of other endocrine, nutritional and metabolic disease: Secondary | ICD-10-CM | POA: Diagnosis not present

## 2021-06-20 DIAGNOSIS — Z79899 Other long term (current) drug therapy: Secondary | ICD-10-CM | POA: Diagnosis not present

## 2021-06-20 DIAGNOSIS — E039 Hypothyroidism, unspecified: Secondary | ICD-10-CM | POA: Diagnosis not present

## 2021-06-21 DIAGNOSIS — E876 Hypokalemia: Secondary | ICD-10-CM | POA: Diagnosis not present

## 2021-06-21 DIAGNOSIS — R531 Weakness: Secondary | ICD-10-CM | POA: Diagnosis not present

## 2021-06-21 DIAGNOSIS — E2749 Other adrenocortical insufficiency: Secondary | ICD-10-CM | POA: Diagnosis not present

## 2021-06-21 DIAGNOSIS — Z79890 Hormone replacement therapy (postmenopausal): Secondary | ICD-10-CM | POA: Diagnosis not present

## 2021-06-21 DIAGNOSIS — N3000 Acute cystitis without hematuria: Secondary | ICD-10-CM | POA: Diagnosis not present

## 2021-06-21 DIAGNOSIS — Z9981 Dependence on supplemental oxygen: Secondary | ICD-10-CM | POA: Diagnosis not present

## 2021-06-21 DIAGNOSIS — U071 COVID-19: Secondary | ICD-10-CM | POA: Diagnosis not present

## 2021-06-21 DIAGNOSIS — B9689 Other specified bacterial agents as the cause of diseases classified elsewhere: Secondary | ICD-10-CM | POA: Diagnosis not present

## 2021-06-21 DIAGNOSIS — Z79899 Other long term (current) drug therapy: Secondary | ICD-10-CM | POA: Diagnosis not present

## 2021-06-21 DIAGNOSIS — Z1612 Extended spectrum beta lactamase (ESBL) resistance: Secondary | ICD-10-CM | POA: Diagnosis not present

## 2021-06-21 DIAGNOSIS — R0902 Hypoxemia: Secondary | ICD-10-CM | POA: Diagnosis not present

## 2021-06-21 DIAGNOSIS — E039 Hypothyroidism, unspecified: Secondary | ICD-10-CM | POA: Diagnosis not present

## 2021-06-21 DIAGNOSIS — I5189 Other ill-defined heart diseases: Secondary | ICD-10-CM | POA: Diagnosis not present

## 2021-06-21 DIAGNOSIS — D649 Anemia, unspecified: Secondary | ICD-10-CM | POA: Diagnosis not present

## 2021-06-22 DIAGNOSIS — Z8639 Personal history of other endocrine, nutritional and metabolic disease: Secondary | ICD-10-CM | POA: Diagnosis not present

## 2021-06-22 DIAGNOSIS — B9689 Other specified bacterial agents as the cause of diseases classified elsewhere: Secondary | ICD-10-CM | POA: Diagnosis not present

## 2021-06-22 DIAGNOSIS — U071 COVID-19: Secondary | ICD-10-CM | POA: Diagnosis not present

## 2021-06-22 DIAGNOSIS — E876 Hypokalemia: Secondary | ICD-10-CM | POA: Diagnosis not present

## 2021-06-22 DIAGNOSIS — E039 Hypothyroidism, unspecified: Secondary | ICD-10-CM | POA: Diagnosis not present

## 2021-06-22 DIAGNOSIS — I5189 Other ill-defined heart diseases: Secondary | ICD-10-CM | POA: Diagnosis not present

## 2021-06-22 DIAGNOSIS — N3000 Acute cystitis without hematuria: Secondary | ICD-10-CM | POA: Diagnosis not present

## 2021-06-22 DIAGNOSIS — Z1612 Extended spectrum beta lactamase (ESBL) resistance: Secondary | ICD-10-CM | POA: Diagnosis not present

## 2021-06-22 DIAGNOSIS — Z79890 Hormone replacement therapy (postmenopausal): Secondary | ICD-10-CM | POA: Diagnosis not present

## 2021-06-22 DIAGNOSIS — Z9981 Dependence on supplemental oxygen: Secondary | ICD-10-CM | POA: Diagnosis not present

## 2021-06-22 DIAGNOSIS — D649 Anemia, unspecified: Secondary | ICD-10-CM | POA: Diagnosis not present

## 2021-06-22 DIAGNOSIS — E2749 Other adrenocortical insufficiency: Secondary | ICD-10-CM | POA: Diagnosis not present

## 2021-06-23 DIAGNOSIS — Z1612 Extended spectrum beta lactamase (ESBL) resistance: Secondary | ICD-10-CM | POA: Diagnosis not present

## 2021-06-23 DIAGNOSIS — U071 COVID-19: Secondary | ICD-10-CM | POA: Diagnosis not present

## 2021-06-23 DIAGNOSIS — Z79890 Hormone replacement therapy (postmenopausal): Secondary | ICD-10-CM | POA: Diagnosis not present

## 2021-06-23 DIAGNOSIS — Z8639 Personal history of other endocrine, nutritional and metabolic disease: Secondary | ICD-10-CM | POA: Diagnosis not present

## 2021-06-23 DIAGNOSIS — E2749 Other adrenocortical insufficiency: Secondary | ICD-10-CM | POA: Diagnosis not present

## 2021-06-23 DIAGNOSIS — N3000 Acute cystitis without hematuria: Secondary | ICD-10-CM | POA: Diagnosis not present

## 2021-06-23 DIAGNOSIS — D649 Anemia, unspecified: Secondary | ICD-10-CM | POA: Diagnosis not present

## 2021-06-23 DIAGNOSIS — Z9981 Dependence on supplemental oxygen: Secondary | ICD-10-CM | POA: Diagnosis not present

## 2021-06-23 DIAGNOSIS — B9689 Other specified bacterial agents as the cause of diseases classified elsewhere: Secondary | ICD-10-CM | POA: Diagnosis not present

## 2021-06-23 DIAGNOSIS — E039 Hypothyroidism, unspecified: Secondary | ICD-10-CM | POA: Diagnosis not present

## 2021-06-23 DIAGNOSIS — I5189 Other ill-defined heart diseases: Secondary | ICD-10-CM | POA: Diagnosis not present

## 2021-06-23 DIAGNOSIS — E876 Hypokalemia: Secondary | ICD-10-CM | POA: Diagnosis not present

## 2021-06-24 DIAGNOSIS — E2749 Other adrenocortical insufficiency: Secondary | ICD-10-CM | POA: Diagnosis not present

## 2021-06-24 DIAGNOSIS — Z8639 Personal history of other endocrine, nutritional and metabolic disease: Secondary | ICD-10-CM | POA: Diagnosis not present

## 2021-06-24 DIAGNOSIS — Z1612 Extended spectrum beta lactamase (ESBL) resistance: Secondary | ICD-10-CM | POA: Diagnosis not present

## 2021-06-24 DIAGNOSIS — U071 COVID-19: Secondary | ICD-10-CM | POA: Diagnosis not present

## 2021-06-24 DIAGNOSIS — Z79899 Other long term (current) drug therapy: Secondary | ICD-10-CM | POA: Diagnosis not present

## 2021-06-24 DIAGNOSIS — I5189 Other ill-defined heart diseases: Secondary | ICD-10-CM | POA: Diagnosis not present

## 2021-06-24 DIAGNOSIS — E039 Hypothyroidism, unspecified: Secondary | ICD-10-CM | POA: Diagnosis not present

## 2021-06-24 DIAGNOSIS — N3000 Acute cystitis without hematuria: Secondary | ICD-10-CM | POA: Diagnosis not present

## 2021-06-24 DIAGNOSIS — Z9981 Dependence on supplemental oxygen: Secondary | ICD-10-CM | POA: Diagnosis not present

## 2021-06-24 DIAGNOSIS — Z79890 Hormone replacement therapy (postmenopausal): Secondary | ICD-10-CM | POA: Diagnosis not present

## 2021-06-24 DIAGNOSIS — B9689 Other specified bacterial agents as the cause of diseases classified elsewhere: Secondary | ICD-10-CM | POA: Diagnosis not present

## 2021-07-06 DIAGNOSIS — E2740 Unspecified adrenocortical insufficiency: Secondary | ICD-10-CM | POA: Diagnosis not present

## 2021-07-06 DIAGNOSIS — I5032 Chronic diastolic (congestive) heart failure: Secondary | ICD-10-CM | POA: Diagnosis not present

## 2021-07-06 DIAGNOSIS — D61818 Other pancytopenia: Secondary | ICD-10-CM | POA: Diagnosis not present

## 2021-07-06 DIAGNOSIS — R5381 Other malaise: Secondary | ICD-10-CM | POA: Diagnosis not present

## 2021-07-06 DIAGNOSIS — U071 COVID-19: Secondary | ICD-10-CM | POA: Diagnosis not present

## 2021-07-06 DIAGNOSIS — N3090 Cystitis, unspecified without hematuria: Secondary | ICD-10-CM | POA: Diagnosis not present

## 2021-07-06 DIAGNOSIS — E039 Hypothyroidism, unspecified: Secondary | ICD-10-CM | POA: Diagnosis not present

## 2021-07-08 DIAGNOSIS — R5383 Other fatigue: Secondary | ICD-10-CM | POA: Diagnosis not present

## 2021-07-08 DIAGNOSIS — S32009A Unspecified fracture of unspecified lumbar vertebra, initial encounter for closed fracture: Secondary | ICD-10-CM | POA: Diagnosis not present

## 2021-07-08 DIAGNOSIS — R569 Unspecified convulsions: Secondary | ICD-10-CM | POA: Diagnosis not present

## 2021-07-08 DIAGNOSIS — R519 Headache, unspecified: Secondary | ICD-10-CM | POA: Diagnosis not present

## 2021-07-08 DIAGNOSIS — D649 Anemia, unspecified: Secondary | ICD-10-CM | POA: Diagnosis not present

## 2021-07-08 DIAGNOSIS — R630 Anorexia: Secondary | ICD-10-CM | POA: Diagnosis not present

## 2021-07-08 DIAGNOSIS — N183 Chronic kidney disease, stage 3 unspecified: Secondary | ICD-10-CM | POA: Diagnosis not present

## 2021-07-08 DIAGNOSIS — I5030 Unspecified diastolic (congestive) heart failure: Secondary | ICD-10-CM | POA: Diagnosis not present

## 2021-07-09 DIAGNOSIS — I10 Essential (primary) hypertension: Secondary | ICD-10-CM | POA: Diagnosis not present

## 2021-07-12 DIAGNOSIS — I10 Essential (primary) hypertension: Secondary | ICD-10-CM | POA: Diagnosis not present

## 2021-07-16 DIAGNOSIS — I10 Essential (primary) hypertension: Secondary | ICD-10-CM | POA: Diagnosis not present

## 2021-07-18 DIAGNOSIS — I130 Hypertensive heart and chronic kidney disease with heart failure and stage 1 through stage 4 chronic kidney disease, or unspecified chronic kidney disease: Secondary | ICD-10-CM | POA: Diagnosis not present

## 2021-07-18 DIAGNOSIS — I5030 Unspecified diastolic (congestive) heart failure: Secondary | ICD-10-CM | POA: Diagnosis not present

## 2021-07-18 DIAGNOSIS — N183 Chronic kidney disease, stage 3 unspecified: Secondary | ICD-10-CM | POA: Diagnosis not present

## 2021-07-18 DIAGNOSIS — G40909 Epilepsy, unspecified, not intractable, without status epilepticus: Secondary | ICD-10-CM | POA: Diagnosis not present

## 2021-07-20 ENCOUNTER — Other Ambulatory Visit: Payer: Self-pay

## 2021-07-20 ENCOUNTER — Encounter: Payer: Self-pay | Admitting: Urology

## 2021-07-20 ENCOUNTER — Ambulatory Visit (INDEPENDENT_AMBULATORY_CARE_PROVIDER_SITE_OTHER): Payer: Medicare Other | Admitting: Urology

## 2021-07-20 VITALS — BP 105/66 | HR 64

## 2021-07-20 DIAGNOSIS — N390 Urinary tract infection, site not specified: Secondary | ICD-10-CM | POA: Diagnosis not present

## 2021-07-20 LAB — MICROSCOPIC EXAMINATION
Renal Epithel, UA: NONE SEEN /hpf
WBC, UA: >30 /hpf — AB (ref 0–5)

## 2021-07-20 LAB — URINALYSIS, ROUTINE W REFLEX MICROSCOPIC
Bilirubin, UA: NEGATIVE
Glucose, UA: NEGATIVE
Ketones, UA: NEGATIVE
Nitrite, UA: POSITIVE — AB
Protein,UA: NEGATIVE
Specific Gravity, UA: 1.015 (ref 1.005–1.030)
Urobilinogen, Ur: 0.2 mg/dL (ref 0.2–1.0)
pH, UA: 6.5 (ref 5.0–7.5)

## 2021-07-20 LAB — BLADDER SCAN AMB NON-IMAGING: Scan Result: 17

## 2021-07-20 MED ORDER — ESTRADIOL 0.1 MG/GM VA CREA: 1.0000 | TOPICAL_CREAM | VAGINAL | 3 refills | Status: AC

## 2021-07-20 MED ORDER — METHENAMINE MANDELATE 1 G PO TABS: ORAL_TABLET | ORAL | 11 refills | Status: DC

## 2021-07-20 NOTE — Progress Notes (Signed)
H&P  Chief Complaint: Recurrent urinary tract infections  History of Present Illness: [name] is a 87 y.o. year old female presenting here for evaluation and management of recurring urinary tract infections.  She has been admitted to the hospital 5 times for treatment of urinary tract infections since October.  Usually, bacterial profile is the same.  Most recent hospitalization was on June 18, 2021.  She was COVID-positive, had weakness, and was found to have UTI.  The below is a wrap-up of her urologic situation at that time:  Patient has history of frequent UTIs and multidrug resistant organisms with complicated allergy history  Urine culture yielded 50,000 - 100,000 colonies of E. coli (ESBL) and >100,000 colonies of Aerococcuas urinae which she has had on previous cultures. Patient had ESBL last hospital admission. Patient was initiated on Invanz today is day 10 of therapy. Patient's mentation has improved to baseline.  She has seen Dr. Lerry Liner before in the past for urinary tract infections.  She has not seen a urologist recently.  She does have dysuria/pain with urination.  She has urinary frequency and urgency.    Past Medical History:  Diagnosis Date   Arthritis    Chronic diastolic heart failure (HCC)    Diverticulosis of colon (without mention of hemorrhage)    TCS by Dr. Gala Romney   GERD (gastroesophageal reflux disease)    Hemorrhoids, internal    TCS by Dr. Gala Romney   Irritable bowel syndrome    Migraine    Mitral regurgitation    Peripheral polyneuropathy    Confirmed by EMG and nerve conduction velocities    Pulmonary nodule    Seizures ([place]) 2011   Syncope    Neurocardiogenic - positive tilt table test, on Florinef   Tubular adenoma    UTI (urinary tract infection)    Recurrent    Past Surgical History:  Procedure Laterality Date   APPENDECTOMY     Back Pain     going to chiropracator for back and hip pain   BACK SURGERY     x2   bladder tack      CATARACT EXTRACTION, BILATERAL  2017   CHOLECYSTECTOMY     COLONOSCOPY  06/16/2005   internal hemorrhoids, L side diverticula - Dr. Gala Romney   COLONOSCOPY N/A 05/02/2013   Dr. Gala Romney- normal rectum, scattered L sided diverticula. bx= tubular adenoma   ESOPHAGOGASTRODUODENOSCOPY  12/21/2006     Dr. Vivi Ferns esophagus/Patulous EG junction, small to moderate sized hiatal hernia,   multiple fundal gland type appearing polyps biopsied, otherwise normal   FOOT SURGERY     cyst removed   TOTAL ABDOMINAL HYSTERECTOMY     TOTAL HIP ARTHROPLASTY Right 01/11/2017   Procedure: RIGHT TOTAL HIP ARTHROPLASTY ANTERIOR APPROACH;  Surgeon: Gaynelle Arabian, MD;  Location: [facility];  Service: Orthopedics;  Laterality: Right;   WRIST SURGERY      Home Medications:  (Not in a hospital admission)   Allergies:  Allergies  Allergen Reactions   Cephalexin Anaphylaxis and Swelling   Doxazosin Mesylate Other (See Comments)    Made patient EXTREMELY sick.    Iodine Hives   Levofloxacin Anaphylaxis and Rash   Codeine Other (See Comments)    Hallucinations   Indomethacin Other (See Comments)    Severe Headache    Ivp Dye [Iodinated Diagnostic Agents] Hives   Morphine Other (See Comments)    Hallucinations    Penicillamine     Other reaction(s): Other (See Comments)   Sulfamethoxazole-Trimethoprim Other (  See Comments)    Unknown    Trimethoprim     Other reaction(s): Other (See Comments)   Penicillins Other (See Comments)    Made patient go into shock Has patient had a PCN reaction causing immediate rash, facial/tongue/throat swelling, SOB or lightheadedness with hypotension: Yes Has patient had a PCN reaction causing severe rash involving mucus membranes or skin necrosis: No Has patient had a PCN reaction that required hospitalization No Has patient had a PCN reaction occurring within the last 10 years: No If all of the above answers are "NO", then may proceed with Cephalosporin use.     Family History   Problem Relation Age of Onset   Stroke Father    Colon cancer Neg Hx     Social History:  reports that she has never smoked. She has never used smokeless tobacco. She reports that she does not drink alcohol and does not use drugs.  ROS: A complete review of systems was performed.  All systems are negative except for pertinent findings as noted.  Physical Exam:  Vital signs in last 24 hours: '@VSRANGES'$ @ General:  Alert and oriented, No acute distress HEENT: Normocephalic, atraumatic Neck: No JVD or lymphadenopathy Cardiovascular: Regular rate  Lungs: Normal inspiratory/expiratory excursion Extremities: No edema Neurologic: Grossly intact  I have reviewed prior pt notes  I have reviewed notes from referring/previous physicians  I have reviewed urinalysis results  There is no prior imaging available  I have reviewed prior urine culture   Impression/Assessment:  1.  Recurrent urinary tract infections.  Not febrile, but associated with weakness, confusion, hospitalization  2.  Vaginal atrophic changes  3.  Urinary frequency, probable overactive bladder  Plan:  1.  Urine will be cultured today.  No current antibiotics administered  2.  I will put her on methenamine hippurate/mandelate, 500 mg twice a day.  She will take one half of a 1 g tablet  3.  Add Estrace cream to the perivaginal area 3 nights a week  4.  I will see back in 6 to 8 weeks for recheck  [name] 07/20/2021, 10:44 AM  Diana Boxer. Renaye Janicki MD

## 2021-07-20 NOTE — Progress Notes (Signed)
Urological Symptom Review PVR 17  Patient is experiencing the following symptoms: Frequent urination Hard to postpone urination Burning/pain with urination Get up at night to urinate Leakage of urine Urinary tract infection   Review of Systems  Gastrointestinal (upper)  : Indigestion/heartburn  Gastrointestinal (lower) : Negative for lower GI symptoms  Constitutional : Fatigue  Skin: Negative for skin symptoms  Eyes: Negative for eye symptoms  Ear/Nose/Throat : Negative for Ear/Nose/Throat symptoms  Hematologic/Lymphatic: Easy bruising  Cardiovascular : Negative for cardiovascular symptoms  Respiratory : Negative for respiratory symptoms  Endocrine: Negative for endocrine symptoms  Musculoskeletal: Joint pain  Neurological: Headaches  Psychologic: Negative for psychiatric symptoms

## 2021-07-21 DIAGNOSIS — N390 Urinary tract infection, site not specified: Secondary | ICD-10-CM | POA: Diagnosis not present

## 2021-07-23 LAB — URINE CULTURE

## 2021-07-27 DIAGNOSIS — Z87440 Personal history of urinary (tract) infections: Secondary | ICD-10-CM | POA: Diagnosis not present

## 2021-07-28 ENCOUNTER — Other Ambulatory Visit: Payer: Self-pay | Admitting: Urology

## 2021-07-28 DIAGNOSIS — I10 Essential (primary) hypertension: Secondary | ICD-10-CM | POA: Diagnosis not present

## 2021-07-29 ENCOUNTER — Telehealth: Payer: Self-pay

## 2021-07-29 NOTE — Telephone Encounter (Signed)
-----   Message from Franchot Gallo, MD sent at 07/28/2021  5:38 PM EDT -----  Please call patient- urine culture grew 2 bacteria, the E coli that commonly grows out in her as well as 1 other.  If she is feeling well, I would prefer just  for her to continue the methenamine.  Otherwise, she will have to take IV antibiotics. ----- Message ----- From: Iris Pert, LPN Sent: [id number]   4:05 PM EDT To: Franchot Gallo, MD  Please review

## 2021-07-29 NOTE — Telephone Encounter (Signed)
Patient called with no answer. Detailed message left. °

## 2021-08-03 ENCOUNTER — Telehealth: Payer: Self-pay

## 2021-08-03 DIAGNOSIS — Z79899 Other long term (current) drug therapy: Secondary | ICD-10-CM | POA: Diagnosis not present

## 2021-08-03 DIAGNOSIS — I5032 Chronic diastolic (congestive) heart failure: Secondary | ICD-10-CM | POA: Diagnosis not present

## 2021-08-03 DIAGNOSIS — Z7982 Long term (current) use of aspirin: Secondary | ICD-10-CM | POA: Diagnosis not present

## 2021-08-03 DIAGNOSIS — Z880 Allergy status to penicillin: Secondary | ICD-10-CM | POA: Diagnosis not present

## 2021-08-03 DIAGNOSIS — E039 Hypothyroidism, unspecified: Secondary | ICD-10-CM | POA: Diagnosis not present

## 2021-08-03 DIAGNOSIS — H6593 Unspecified nonsuppurative otitis media, bilateral: Secondary | ICD-10-CM | POA: Diagnosis not present

## 2021-08-03 DIAGNOSIS — M1990 Unspecified osteoarthritis, unspecified site: Secondary | ICD-10-CM | POA: Diagnosis not present

## 2021-08-03 DIAGNOSIS — H9202 Otalgia, left ear: Secondary | ICD-10-CM | POA: Diagnosis not present

## 2021-08-03 DIAGNOSIS — D649 Anemia, unspecified: Secondary | ICD-10-CM | POA: Diagnosis not present

## 2021-08-03 DIAGNOSIS — D703 Neutropenia due to infection: Secondary | ICD-10-CM | POA: Diagnosis not present

## 2021-08-03 DIAGNOSIS — M26622 Arthralgia of left temporomandibular joint: Secondary | ICD-10-CM | POA: Diagnosis not present

## 2021-08-03 DIAGNOSIS — Z862 Personal history of diseases of the blood and blood-forming organs and certain disorders involving the immune mechanism: Secondary | ICD-10-CM | POA: Diagnosis not present

## 2021-08-03 DIAGNOSIS — I10 Essential (primary) hypertension: Secondary | ICD-10-CM | POA: Diagnosis not present

## 2021-08-03 NOTE — Telephone Encounter (Signed)
Spoke with Mrs. Adams husband and message left with him to have her return call to office concerning her mother.

## 2021-08-03 NOTE — Telephone Encounter (Signed)
-----   Message from Franchot Gallo, MD sent at 07/28/2021  5:38 PM EDT -----  Please call patient- urine culture grew 2 bacteria, the E coli that commonly grows out in her as well as 1 other.  If she is feeling well, I would prefer just  for her to continue the methenamine.  Otherwise, she will have to take IV antibiotics. ----- Message ----- From: Iris Pert, LPN Sent: [id number]   4:05 PM EDT To: Franchot Gallo, MD  Please review

## 2021-08-04 DIAGNOSIS — D61818 Other pancytopenia: Secondary | ICD-10-CM | POA: Diagnosis not present

## 2021-08-04 DIAGNOSIS — N3090 Cystitis, unspecified without hematuria: Secondary | ICD-10-CM | POA: Diagnosis not present

## 2021-08-04 DIAGNOSIS — I10 Essential (primary) hypertension: Secondary | ICD-10-CM | POA: Diagnosis not present

## 2021-08-04 DIAGNOSIS — R5381 Other malaise: Secondary | ICD-10-CM | POA: Diagnosis not present

## 2021-08-04 DIAGNOSIS — M1990 Unspecified osteoarthritis, unspecified site: Secondary | ICD-10-CM | POA: Diagnosis not present

## 2021-08-04 DIAGNOSIS — I5032 Chronic diastolic (congestive) heart failure: Secondary | ICD-10-CM | POA: Diagnosis not present

## 2021-08-04 DIAGNOSIS — E039 Hypothyroidism, unspecified: Secondary | ICD-10-CM | POA: Diagnosis not present

## 2021-08-06 DIAGNOSIS — M545 Low back pain, unspecified: Secondary | ICD-10-CM | POA: Diagnosis not present

## 2021-08-07 DIAGNOSIS — I10 Essential (primary) hypertension: Secondary | ICD-10-CM | POA: Diagnosis not present

## 2021-08-07 DIAGNOSIS — M4856XA Collapsed vertebra, not elsewhere classified, lumbar region, initial encounter for fracture: Secondary | ICD-10-CM | POA: Diagnosis not present

## 2021-08-07 DIAGNOSIS — N390 Urinary tract infection, site not specified: Secondary | ICD-10-CM | POA: Diagnosis not present

## 2021-08-09 NOTE — Telephone Encounter (Signed)
See patient message

## 2021-08-11 DIAGNOSIS — I5032 Chronic diastolic (congestive) heart failure: Secondary | ICD-10-CM | POA: Diagnosis not present

## 2021-08-11 DIAGNOSIS — I739 Peripheral vascular disease, unspecified: Secondary | ICD-10-CM | POA: Diagnosis not present

## 2021-08-11 DIAGNOSIS — I110 Hypertensive heart disease with heart failure: Secondary | ICD-10-CM | POA: Diagnosis not present

## 2021-08-11 DIAGNOSIS — U071 COVID-19: Secondary | ICD-10-CM | POA: Diagnosis not present

## 2021-08-11 DIAGNOSIS — Z9181 History of falling: Secondary | ICD-10-CM | POA: Diagnosis not present

## 2021-08-11 DIAGNOSIS — Z7952 Long term (current) use of systemic steroids: Secondary | ICD-10-CM | POA: Diagnosis not present

## 2021-08-11 DIAGNOSIS — Z7951 Long term (current) use of inhaled steroids: Secondary | ICD-10-CM | POA: Diagnosis not present

## 2021-08-11 DIAGNOSIS — M19072 Primary osteoarthritis, left ankle and foot: Secondary | ICD-10-CM | POA: Diagnosis not present

## 2021-08-11 DIAGNOSIS — Z7982 Long term (current) use of aspirin: Secondary | ICD-10-CM | POA: Diagnosis not present

## 2021-08-17 DIAGNOSIS — N183 Chronic kidney disease, stage 3 unspecified: Secondary | ICD-10-CM | POA: Diagnosis not present

## 2021-08-17 DIAGNOSIS — I5030 Unspecified diastolic (congestive) heart failure: Secondary | ICD-10-CM | POA: Diagnosis not present

## 2021-08-17 DIAGNOSIS — R300 Dysuria: Secondary | ICD-10-CM | POA: Diagnosis not present

## 2021-08-17 DIAGNOSIS — R5383 Other fatigue: Secondary | ICD-10-CM | POA: Diagnosis not present

## 2021-08-17 DIAGNOSIS — Z6824 Body mass index (BMI) 24.0-24.9, adult: Secondary | ICD-10-CM | POA: Diagnosis not present

## 2021-08-18 DIAGNOSIS — I130 Hypertensive heart and chronic kidney disease with heart failure and stage 1 through stage 4 chronic kidney disease, or unspecified chronic kidney disease: Secondary | ICD-10-CM | POA: Diagnosis not present

## 2021-08-18 DIAGNOSIS — I5030 Unspecified diastolic (congestive) heart failure: Secondary | ICD-10-CM | POA: Diagnosis not present

## 2021-08-18 DIAGNOSIS — G40909 Epilepsy, unspecified, not intractable, without status epilepticus: Secondary | ICD-10-CM | POA: Diagnosis not present

## 2021-08-18 DIAGNOSIS — N183 Chronic kidney disease, stage 3 unspecified: Secondary | ICD-10-CM | POA: Diagnosis not present

## 2021-08-20 ENCOUNTER — Other Ambulatory Visit: Payer: Self-pay

## 2021-08-20 ENCOUNTER — Ambulatory Visit (HOSPITAL_COMMUNITY)
Admission: RE | Admit: 2021-08-20 | Discharge: 2021-08-20 | Disposition: A | Discharge status: Home / Self Care | Payer: Medicare Other | Source: Ambulatory Visit | Attending: Urology | Admitting: Urology

## 2021-08-20 DIAGNOSIS — I739 Peripheral vascular disease, unspecified: Secondary | ICD-10-CM | POA: Diagnosis not present

## 2021-08-20 DIAGNOSIS — Z7982 Long term (current) use of aspirin: Secondary | ICD-10-CM | POA: Diagnosis not present

## 2021-08-20 DIAGNOSIS — N390 Urinary tract infection, site not specified: Secondary | ICD-10-CM | POA: Insufficient documentation

## 2021-08-20 DIAGNOSIS — U071 COVID-19: Secondary | ICD-10-CM | POA: Diagnosis not present

## 2021-08-20 DIAGNOSIS — I5032 Chronic diastolic (congestive) heart failure: Secondary | ICD-10-CM | POA: Diagnosis not present

## 2021-08-20 DIAGNOSIS — M19072 Primary osteoarthritis, left ankle and foot: Secondary | ICD-10-CM | POA: Diagnosis not present

## 2021-08-20 DIAGNOSIS — N200 Calculus of kidney: Secondary | ICD-10-CM | POA: Diagnosis not present

## 2021-08-20 DIAGNOSIS — I110 Hypertensive heart disease with heart failure: Secondary | ICD-10-CM | POA: Diagnosis not present

## 2021-08-25 DIAGNOSIS — I5032 Chronic diastolic (congestive) heart failure: Secondary | ICD-10-CM | POA: Diagnosis not present

## 2021-08-25 DIAGNOSIS — Z7982 Long term (current) use of aspirin: Secondary | ICD-10-CM | POA: Diagnosis not present

## 2021-08-25 DIAGNOSIS — I110 Hypertensive heart disease with heart failure: Secondary | ICD-10-CM | POA: Diagnosis not present

## 2021-08-25 DIAGNOSIS — U071 COVID-19: Secondary | ICD-10-CM | POA: Diagnosis not present

## 2021-08-25 DIAGNOSIS — I739 Peripheral vascular disease, unspecified: Secondary | ICD-10-CM | POA: Diagnosis not present

## 2021-08-25 DIAGNOSIS — M19072 Primary osteoarthritis, left ankle and foot: Secondary | ICD-10-CM | POA: Diagnosis not present

## 2021-08-30 DIAGNOSIS — H40013 Open angle with borderline findings, low risk, bilateral: Secondary | ICD-10-CM | POA: Diagnosis not present

## 2021-08-30 NOTE — Progress Notes (Signed)
History of Present Illness:   8.2.2022: presenting here for evaluation and management of recurring urinary tract infections.  She has been admitted to the hospital 5 times for treatment of urinary tract infections since October.  Usually, bacterial profile is the same.  Most recent hospitalization was on June 18, 2021.  She was COVID-positive, had weakness, and was found to have UTI.  The below is a wrap-up of her urologic situation at that time:  Patient has history of frequent UTIs and multidrug resistant organisms with complicated allergy history  Urine culture yielded 50,000 - 100,000 colonies of E. coli (ESBL) and >100,000 colonies of Aerococcuas urinae which she has had on previous cultures. Patient had ESBL last hospital admission. Patient was initiated on Invanz today is day 10 of therapy. Patient's mentation has improved to baseline.  She has seen Dr. Lerry Liner before in the past for urinary tract infections.  She has not seen a urologist recently.  She does have dysuria/pain with urination.  She has urinary frequency and urgency.  I treated her w/ bid doses of methenamine as well as perivaginal estrogen cream.  9.13.2022: She returns today for follow-up with her daughter.  Fortunately, she has not needed to be treated for urinary tract infection since her first visit.  They are using the estrogen cream 3 nights a week, the Metheney mean is difficult to cut in half.  She is having some lower abdominal discomfort and frequent bowel movements.  She denies dysuria or gross hematuria.  Past Medical History:  Diagnosis Date   Arthritis    Chronic diastolic heart failure (HCC)    Diverticulosis of colon (without mention of hemorrhage)    TCS by Dr. Gala Romney   GERD (gastroesophageal reflux disease)    Hemorrhoids, internal    TCS by Dr. Gala Romney   Irritable bowel syndrome    Migraine    Mitral regurgitation    Peripheral polyneuropathy    Confirmed by EMG and nerve conduction velocities     Pulmonary nodule    Seizures ([place]) 2011   Syncope    Neurocardiogenic - positive tilt table test, on Florinef   Tubular adenoma    UTI (urinary tract infection)    Recurrent    Past Surgical History:  Procedure Laterality Date   APPENDECTOMY     Back Pain     going to chiropracator for back and hip pain   BACK SURGERY     x2   bladder tack     CATARACT EXTRACTION, BILATERAL  2017   CHOLECYSTECTOMY     COLONOSCOPY  06/16/2005   internal hemorrhoids, L side diverticula - Dr. Gala Romney   COLONOSCOPY N/A 05/02/2013   Dr. Gala Romney- normal rectum, scattered L sided diverticula. bx= tubular adenoma   ESOPHAGOGASTRODUODENOSCOPY  12/21/2006     Dr. Vivi Ferns esophagus/Patulous EG junction, small to moderate sized hiatal hernia,   multiple fundal gland type appearing polyps biopsied, otherwise normal   FOOT SURGERY     cyst removed   TOTAL ABDOMINAL HYSTERECTOMY     TOTAL HIP ARTHROPLASTY Right 01/11/2017   Procedure: RIGHT TOTAL HIP ARTHROPLASTY ANTERIOR APPROACH;  Surgeon: Gaynelle Arabian, MD;  Location: [facility];  Service: Orthopedics;  Laterality: Right;   WRIST SURGERY      Home Medications:  (Not in a hospital admission)   Allergies:  Allergies  Allergen Reactions   Cephalexin Anaphylaxis and Swelling   Doxazosin Mesylate Other (See Comments)    Made patient EXTREMELY sick.    Iodine Hives  Levofloxacin Anaphylaxis and Rash   Codeine Other (See Comments)    Hallucinations   Indomethacin Other (See Comments)    Severe Headache    Ivp Dye [Iodinated Diagnostic Agents] Hives   Morphine Other (See Comments)    Hallucinations    Penicillamine     Other reaction(s): Other (See Comments)   Sulfamethoxazole-Trimethoprim Other (See Comments)    Unknown    Trimethoprim     Other reaction(s): Other (See Comments)   Penicillins Other (See Comments)    Made patient go into shock Has patient had a PCN reaction causing immediate rash, facial/tongue/throat swelling, SOB or  lightheadedness with hypotension: Yes Has patient had a PCN reaction causing severe rash involving mucus membranes or skin necrosis: No Has patient had a PCN reaction that required hospitalization No Has patient had a PCN reaction occurring within the last 10 years: No If all of the above answers are "NO", then may proceed with Cephalosporin use.     Family History  Problem Relation Age of Onset   Stroke Father    Colon cancer Neg Hx     Social History:  reports that she has never smoked. She has never used smokeless tobacco. She reports that she does not drink alcohol and does not use drugs.  ROS: A complete review of systems was performed.  All systems are negative except for pertinent findings as noted.  Physical Exam:  Vital signs in last 24 hours: '@VSRANGES'$ @ General:  Alert and oriented, No acute distress HEENT: Normocephalic, atraumatic Neck: No JVD or lymphadenopathy Cardiovascular: Regular rate  Lungs: Normal inspiratory/expiratory excursion Neurologic: Grossly intact  I have reviewed prior pt notes  I have reviewed urinalysis results  I have independently reviewed prior imaging--renal ultrasound results from 08/21/2021: 1. Nonobstructive left renal nephrolithiasis measuring up to 4 mm as above. No hydronephrosis. 2. Scattered layering echogenic material within the bladder lumen, which could reflect sedimentation and/or debris. Correlation with urinalysis recommended.   I have reviewed prior urine culture   Impression/Assessment:  Recurrent resistant urinary tract infections.  Patient has been relatively asymptomatic on suppression with methenamine and perivaginal estrogen cream  Plan:  I sent in a new prescription but for 500 mg tablets twice a day as the 1000 mg tablets are hard to break  I will see back in about 3 months for recheck  Jorja Loa 08/30/2021, 10:21 AM  Lillette Boxer. Dailin Sosnowski MD

## 2021-08-31 ENCOUNTER — Encounter: Payer: Self-pay | Admitting: Urology

## 2021-08-31 ENCOUNTER — Other Ambulatory Visit: Payer: Self-pay

## 2021-08-31 ENCOUNTER — Ambulatory Visit (INDEPENDENT_AMBULATORY_CARE_PROVIDER_SITE_OTHER): Payer: Medicare Other | Admitting: Urology

## 2021-08-31 VITALS — BP 91/51 | HR 79

## 2021-08-31 DIAGNOSIS — N952 Postmenopausal atrophic vaginitis: Secondary | ICD-10-CM

## 2021-08-31 DIAGNOSIS — N390 Urinary tract infection, site not specified: Secondary | ICD-10-CM | POA: Diagnosis not present

## 2021-08-31 DIAGNOSIS — D5911 Warm autoimmune hemolytic anemia: Secondary | ICD-10-CM | POA: Diagnosis not present

## 2021-08-31 DIAGNOSIS — D649 Anemia, unspecified: Secondary | ICD-10-CM | POA: Diagnosis not present

## 2021-08-31 DIAGNOSIS — I152 Hypertension secondary to endocrine disorders: Secondary | ICD-10-CM | POA: Diagnosis not present

## 2021-08-31 DIAGNOSIS — D696 Thrombocytopenia, unspecified: Secondary | ICD-10-CM | POA: Diagnosis not present

## 2021-08-31 DIAGNOSIS — D709 Neutropenia, unspecified: Secondary | ICD-10-CM | POA: Diagnosis not present

## 2021-08-31 LAB — URINALYSIS, ROUTINE W REFLEX MICROSCOPIC
Bilirubin, UA: NEGATIVE
Glucose, UA: NEGATIVE
Ketones, UA: NEGATIVE
Nitrite, UA: POSITIVE — AB
Protein,UA: NEGATIVE
Specific Gravity, UA: 1.015 (ref 1.005–1.030)
Urobilinogen, Ur: 0.2 mg/dL (ref 0.2–1.0)
pH, UA: 6.5 (ref 5.0–7.5)

## 2021-08-31 LAB — MICROSCOPIC EXAMINATION
Epithelial Cells (non renal): >10 /hpf — AB (ref 0–10)
Renal Epithel, UA: NONE SEEN /hpf
WBC, UA: >30 /hpf — AB (ref 0–5)

## 2021-08-31 MED ORDER — METHENAMINE MANDELATE 0.5 G PO TABS: 500.0000 mg | ORAL_TABLET | Freq: Two times a day (BID) | ORAL | 11 refills | Status: DC

## 2021-08-31 NOTE — Progress Notes (Signed)
Urological Symptom Review  Patient is experiencing the following symptoms: Frequent urination Hard to postpone urination Burning/pain with urination Leakage of urine Urinary tract infection   Review of Systems  Gastrointestinal (upper)  : Nausea Indigestion/heartburn  Gastrointestinal (lower) : Diarrhea  Constitutional : Weight loss Fatigue  Skin: Negative for skin symptoms  Eyes: Negative for eye symptoms  Ear/Nose/Throat : Negative for Ear/Nose/Throat symptoms  Hematologic/Lymphatic: Easy bruising  Cardiovascular : Negative for cardiovascular symptoms  Respiratory : Negative for respiratory symptoms  Endocrine: Negative for endocrine symptoms  Musculoskeletal: Back pain Joint pain  Neurological: Negative for neurological symptoms  Psychologic: Negative for psychiatric symptoms

## 2021-09-01 DIAGNOSIS — Z7982 Long term (current) use of aspirin: Secondary | ICD-10-CM | POA: Diagnosis not present

## 2021-09-01 DIAGNOSIS — M19072 Primary osteoarthritis, left ankle and foot: Secondary | ICD-10-CM | POA: Diagnosis not present

## 2021-09-01 DIAGNOSIS — I110 Hypertensive heart disease with heart failure: Secondary | ICD-10-CM | POA: Diagnosis not present

## 2021-09-01 DIAGNOSIS — I739 Peripheral vascular disease, unspecified: Secondary | ICD-10-CM | POA: Diagnosis not present

## 2021-09-01 DIAGNOSIS — U071 COVID-19: Secondary | ICD-10-CM | POA: Diagnosis not present

## 2021-09-01 DIAGNOSIS — I5032 Chronic diastolic (congestive) heart failure: Secondary | ICD-10-CM | POA: Diagnosis not present

## 2021-09-06 DIAGNOSIS — I5032 Chronic diastolic (congestive) heart failure: Secondary | ICD-10-CM | POA: Diagnosis not present

## 2021-09-06 DIAGNOSIS — I130 Hypertensive heart and chronic kidney disease with heart failure and stage 1 through stage 4 chronic kidney disease, or unspecified chronic kidney disease: Secondary | ICD-10-CM | POA: Diagnosis not present

## 2021-09-06 DIAGNOSIS — Z7951 Long term (current) use of inhaled steroids: Secondary | ICD-10-CM | POA: Diagnosis not present

## 2021-09-06 DIAGNOSIS — J9611 Chronic respiratory failure with hypoxia: Secondary | ICD-10-CM | POA: Diagnosis not present

## 2021-09-06 DIAGNOSIS — M150 Primary generalized (osteo)arthritis: Secondary | ICD-10-CM | POA: Diagnosis not present

## 2021-09-06 DIAGNOSIS — Z7982 Long term (current) use of aspirin: Secondary | ICD-10-CM | POA: Diagnosis not present

## 2021-09-06 DIAGNOSIS — M25572 Pain in left ankle and joints of left foot: Secondary | ICD-10-CM | POA: Diagnosis not present

## 2021-09-06 DIAGNOSIS — N182 Chronic kidney disease, stage 2 (mild): Secondary | ICD-10-CM | POA: Diagnosis not present

## 2021-09-07 DIAGNOSIS — R300 Dysuria: Secondary | ICD-10-CM | POA: Diagnosis not present

## 2021-09-08 DIAGNOSIS — N183 Chronic kidney disease, stage 3 unspecified: Secondary | ICD-10-CM | POA: Diagnosis not present

## 2021-09-08 DIAGNOSIS — M19072 Primary osteoarthritis, left ankle and foot: Secondary | ICD-10-CM | POA: Diagnosis not present

## 2021-09-08 DIAGNOSIS — I739 Peripheral vascular disease, unspecified: Secondary | ICD-10-CM | POA: Diagnosis not present

## 2021-09-08 DIAGNOSIS — R5383 Other fatigue: Secondary | ICD-10-CM | POA: Diagnosis not present

## 2021-09-08 DIAGNOSIS — I5032 Chronic diastolic (congestive) heart failure: Secondary | ICD-10-CM | POA: Diagnosis not present

## 2021-09-08 DIAGNOSIS — R300 Dysuria: Secondary | ICD-10-CM | POA: Diagnosis not present

## 2021-09-08 DIAGNOSIS — I110 Hypertensive heart disease with heart failure: Secondary | ICD-10-CM | POA: Diagnosis not present

## 2021-09-08 DIAGNOSIS — Z7982 Long term (current) use of aspirin: Secondary | ICD-10-CM | POA: Diagnosis not present

## 2021-09-08 DIAGNOSIS — U071 COVID-19: Secondary | ICD-10-CM | POA: Diagnosis not present

## 2021-09-08 DIAGNOSIS — I5030 Unspecified diastolic (congestive) heart failure: Secondary | ICD-10-CM | POA: Diagnosis not present

## 2021-09-10 DIAGNOSIS — Z9181 History of falling: Secondary | ICD-10-CM | POA: Diagnosis not present

## 2021-09-10 DIAGNOSIS — I5032 Chronic diastolic (congestive) heart failure: Secondary | ICD-10-CM | POA: Diagnosis not present

## 2021-09-10 DIAGNOSIS — H9190 Unspecified hearing loss, unspecified ear: Secondary | ICD-10-CM | POA: Diagnosis not present

## 2021-09-10 DIAGNOSIS — M19072 Primary osteoarthritis, left ankle and foot: Secondary | ICD-10-CM | POA: Diagnosis not present

## 2021-09-10 DIAGNOSIS — Z7982 Long term (current) use of aspirin: Secondary | ICD-10-CM | POA: Diagnosis not present

## 2021-09-10 DIAGNOSIS — H903 Sensorineural hearing loss, bilateral: Secondary | ICD-10-CM | POA: Diagnosis not present

## 2021-09-10 DIAGNOSIS — Z7952 Long term (current) use of systemic steroids: Secondary | ICD-10-CM | POA: Diagnosis not present

## 2021-09-10 DIAGNOSIS — Z7951 Long term (current) use of inhaled steroids: Secondary | ICD-10-CM | POA: Diagnosis not present

## 2021-09-10 DIAGNOSIS — I739 Peripheral vascular disease, unspecified: Secondary | ICD-10-CM | POA: Diagnosis not present

## 2021-09-10 DIAGNOSIS — H6593 Unspecified nonsuppurative otitis media, bilateral: Secondary | ICD-10-CM | POA: Diagnosis not present

## 2021-09-10 DIAGNOSIS — U071 COVID-19: Secondary | ICD-10-CM | POA: Diagnosis not present

## 2021-09-10 DIAGNOSIS — I110 Hypertensive heart disease with heart failure: Secondary | ICD-10-CM | POA: Diagnosis not present

## 2021-09-13 ENCOUNTER — Telehealth: Payer: Self-pay

## 2021-09-13 DIAGNOSIS — I5032 Chronic diastolic (congestive) heart failure: Secondary | ICD-10-CM | POA: Diagnosis not present

## 2021-09-13 DIAGNOSIS — M19072 Primary osteoarthritis, left ankle and foot: Secondary | ICD-10-CM | POA: Diagnosis not present

## 2021-09-13 DIAGNOSIS — U071 COVID-19: Secondary | ICD-10-CM | POA: Diagnosis not present

## 2021-09-13 DIAGNOSIS — Z7982 Long term (current) use of aspirin: Secondary | ICD-10-CM | POA: Diagnosis not present

## 2021-09-13 DIAGNOSIS — I739 Peripheral vascular disease, unspecified: Secondary | ICD-10-CM | POA: Diagnosis not present

## 2021-09-13 DIAGNOSIS — I110 Hypertensive heart disease with heart failure: Secondary | ICD-10-CM | POA: Diagnosis not present

## 2021-09-13 NOTE — Telephone Encounter (Signed)
Received call from daughter this am in regards to patient having diarrhea episodes. Daughter stated patient was having abdominal discomfort prior to coming to see MD 2 weeks ago but symptoms have worsened.  Daughter instructed to call patient PCP to inquire about abd pain and diarrhea symptoms. Daughter voiced understanding.

## 2021-09-14 DIAGNOSIS — R197 Diarrhea, unspecified: Secondary | ICD-10-CM | POA: Diagnosis not present

## 2021-09-15 DIAGNOSIS — I110 Hypertensive heart disease with heart failure: Secondary | ICD-10-CM | POA: Diagnosis not present

## 2021-09-15 DIAGNOSIS — Z7982 Long term (current) use of aspirin: Secondary | ICD-10-CM | POA: Diagnosis not present

## 2021-09-15 DIAGNOSIS — E039 Hypothyroidism, unspecified: Secondary | ICD-10-CM | POA: Diagnosis not present

## 2021-09-15 DIAGNOSIS — R197 Diarrhea, unspecified: Secondary | ICD-10-CM | POA: Diagnosis not present

## 2021-09-15 DIAGNOSIS — M19072 Primary osteoarthritis, left ankle and foot: Secondary | ICD-10-CM | POA: Diagnosis not present

## 2021-09-15 DIAGNOSIS — U071 COVID-19: Secondary | ICD-10-CM | POA: Diagnosis not present

## 2021-09-15 DIAGNOSIS — N3090 Cystitis, unspecified without hematuria: Secondary | ICD-10-CM | POA: Diagnosis not present

## 2021-09-15 DIAGNOSIS — K2100 Gastro-esophageal reflux disease with esophagitis, without bleeding: Secondary | ICD-10-CM | POA: Diagnosis not present

## 2021-09-15 DIAGNOSIS — R109 Unspecified abdominal pain: Secondary | ICD-10-CM | POA: Diagnosis not present

## 2021-09-15 DIAGNOSIS — Z6825 Body mass index (BMI) 25.0-25.9, adult: Secondary | ICD-10-CM | POA: Diagnosis not present

## 2021-09-15 DIAGNOSIS — I5032 Chronic diastolic (congestive) heart failure: Secondary | ICD-10-CM | POA: Diagnosis not present

## 2021-09-15 DIAGNOSIS — I739 Peripheral vascular disease, unspecified: Secondary | ICD-10-CM | POA: Diagnosis not present

## 2021-09-21 DIAGNOSIS — I739 Peripheral vascular disease, unspecified: Secondary | ICD-10-CM | POA: Diagnosis not present

## 2021-09-21 DIAGNOSIS — M2042 Other hammer toe(s) (acquired), left foot: Secondary | ICD-10-CM | POA: Diagnosis not present

## 2021-09-21 DIAGNOSIS — M79672 Pain in left foot: Secondary | ICD-10-CM | POA: Diagnosis not present

## 2021-09-21 DIAGNOSIS — L110 Acquired keratosis follicularis: Secondary | ICD-10-CM | POA: Diagnosis not present

## 2021-09-21 DIAGNOSIS — M25579 Pain in unspecified ankle and joints of unspecified foot: Secondary | ICD-10-CM | POA: Diagnosis not present

## 2021-09-21 DIAGNOSIS — M2041 Other hammer toe(s) (acquired), right foot: Secondary | ICD-10-CM | POA: Diagnosis not present

## 2021-09-21 DIAGNOSIS — M79671 Pain in right foot: Secondary | ICD-10-CM | POA: Diagnosis not present

## 2021-09-22 DIAGNOSIS — L609 Nail disorder, unspecified: Secondary | ICD-10-CM | POA: Diagnosis not present

## 2021-09-22 DIAGNOSIS — M19072 Primary osteoarthritis, left ankle and foot: Secondary | ICD-10-CM | POA: Diagnosis not present

## 2021-09-22 DIAGNOSIS — I5032 Chronic diastolic (congestive) heart failure: Secondary | ICD-10-CM | POA: Diagnosis not present

## 2021-09-22 DIAGNOSIS — Z7982 Long term (current) use of aspirin: Secondary | ICD-10-CM | POA: Diagnosis not present

## 2021-09-22 DIAGNOSIS — D1801 Hemangioma of skin and subcutaneous tissue: Secondary | ICD-10-CM | POA: Diagnosis not present

## 2021-09-22 DIAGNOSIS — L570 Actinic keratosis: Secondary | ICD-10-CM | POA: Diagnosis not present

## 2021-09-22 DIAGNOSIS — I110 Hypertensive heart disease with heart failure: Secondary | ICD-10-CM | POA: Diagnosis not present

## 2021-09-22 DIAGNOSIS — I739 Peripheral vascular disease, unspecified: Secondary | ICD-10-CM | POA: Diagnosis not present

## 2021-09-22 DIAGNOSIS — U071 COVID-19: Secondary | ICD-10-CM | POA: Diagnosis not present

## 2021-09-24 DIAGNOSIS — I739 Peripheral vascular disease, unspecified: Secondary | ICD-10-CM | POA: Diagnosis not present

## 2021-09-24 DIAGNOSIS — M19072 Primary osteoarthritis, left ankle and foot: Secondary | ICD-10-CM | POA: Diagnosis not present

## 2021-09-24 DIAGNOSIS — I5032 Chronic diastolic (congestive) heart failure: Secondary | ICD-10-CM | POA: Diagnosis not present

## 2021-09-24 DIAGNOSIS — U071 COVID-19: Secondary | ICD-10-CM | POA: Diagnosis not present

## 2021-09-24 DIAGNOSIS — Z7982 Long term (current) use of aspirin: Secondary | ICD-10-CM | POA: Diagnosis not present

## 2021-09-24 DIAGNOSIS — I110 Hypertensive heart disease with heart failure: Secondary | ICD-10-CM | POA: Diagnosis not present

## 2021-09-27 DIAGNOSIS — Z7982 Long term (current) use of aspirin: Secondary | ICD-10-CM | POA: Diagnosis not present

## 2021-09-27 DIAGNOSIS — M19072 Primary osteoarthritis, left ankle and foot: Secondary | ICD-10-CM | POA: Diagnosis not present

## 2021-09-27 DIAGNOSIS — U071 COVID-19: Secondary | ICD-10-CM | POA: Diagnosis not present

## 2021-09-27 DIAGNOSIS — I110 Hypertensive heart disease with heart failure: Secondary | ICD-10-CM | POA: Diagnosis not present

## 2021-09-27 DIAGNOSIS — I739 Peripheral vascular disease, unspecified: Secondary | ICD-10-CM | POA: Diagnosis not present

## 2021-09-27 DIAGNOSIS — I5032 Chronic diastolic (congestive) heart failure: Secondary | ICD-10-CM | POA: Diagnosis not present

## 2021-09-29 DIAGNOSIS — I110 Hypertensive heart disease with heart failure: Secondary | ICD-10-CM | POA: Diagnosis not present

## 2021-09-29 DIAGNOSIS — I739 Peripheral vascular disease, unspecified: Secondary | ICD-10-CM | POA: Diagnosis not present

## 2021-09-29 DIAGNOSIS — U071 COVID-19: Secondary | ICD-10-CM | POA: Diagnosis not present

## 2021-09-29 DIAGNOSIS — M19072 Primary osteoarthritis, left ankle and foot: Secondary | ICD-10-CM | POA: Diagnosis not present

## 2021-09-29 DIAGNOSIS — I5032 Chronic diastolic (congestive) heart failure: Secondary | ICD-10-CM | POA: Diagnosis not present

## 2021-09-29 DIAGNOSIS — Z7982 Long term (current) use of aspirin: Secondary | ICD-10-CM | POA: Diagnosis not present

## 2021-10-05 DIAGNOSIS — D649 Anemia, unspecified: Secondary | ICD-10-CM | POA: Diagnosis not present

## 2021-10-06 ENCOUNTER — Encounter: Payer: Self-pay | Admitting: *Deleted

## 2021-10-06 NOTE — Progress Notes (Signed)
Cardiology Office Note  Date: 10/07/2021   ID: [name], DOB 06/27/1930, MRN [id number]  PCP:  Manon Hilding, MD  Cardiologist:  Rozann Lesches, MD Electrophysiologist:  None   Chief Complaint: Fluid retention,  History of Present Illness: [name] is a 87 y.o. female with a history of chronic diastolic heart failure, HTN, PACs, MR, vasovagal syncope, exertional dyspnea, chest pain, seizures, anemia  She was last seen by Dr. Domenic Polite via telemedicine on 01/06/2020 for bilateral leg edema.  She complained of leg pain, swelling, skin tightness with some erythema.  This had been chronic recurrent problem.  She had not tried to increase her Lasix.  Her current medication regimen included low-dose aspirin, Lasix 20 mg daily and Florinef.   Dr. Domenic Polite mentioned given her chronic recurrent bilateral leg edema and association with diastolic dysfunction and pulmonary hypertension plan was to continue to elevate legs when able and watch salt in diet.  Lasix was increased to 20 mg alternating with 40 mg daily to see if this would be helpful.  He mentioned may need to consider Demadex if higher dose of Lasix was not effective.  Given her history of neurocardiogenic syncope she remained on low-dose Florinef.   She is here today for weight gain and lower extremity edema worse over the prior 2 weeks. She saw hematology on 10/06/2021 who suggested she follow-up due to weight gain.  Her weight was up 16 pounds at 176 pounds since the prior month when she weighed 160 at hematology office.  Her daughter mentioned she had been retaining fluid in her legs for prior 2 weeks.  Lasix as needed was not working.  Leg edema was not going down after sleeping.  She had 2+ edema.  Plan was to refer to cardiology.  Her weight today is 179.  She does have continuing 2+ edema.  She complains of some increased activity intolerance and shortness of breath recently.  Her prior echocardiogram On January 24, 2018 demonstrated EF of 60 to 65%.  Mild MR, moderate tricuspid regurgitation.    Past Medical History:  Diagnosis Date   Arthritis    Chronic diastolic heart failure (HCC)    Diverticulosis of colon (without mention of hemorrhage)    TCS by Dr. Gala Romney   GERD (gastroesophageal reflux disease)    Hemorrhoids, internal    TCS by Dr. Gala Romney   Irritable bowel syndrome    Migraine    Mitral regurgitation    Peripheral polyneuropathy    Confirmed by EMG and nerve conduction velocities    Pulmonary nodule    Seizures ([place]) 2011   Syncope    Neurocardiogenic - positive tilt table test, on Florinef   Tubular adenoma    UTI (urinary tract infection)    Recurrent    Past Surgical History:  Procedure Laterality Date   APPENDECTOMY     Back Pain     going to chiropracator for back and hip pain   BACK SURGERY     x2   bladder tack     CATARACT EXTRACTION, BILATERAL  2017   CHOLECYSTECTOMY     COLONOSCOPY  06/16/2005   internal hemorrhoids, L side diverticula - Dr. Gala Romney   COLONOSCOPY N/A 05/02/2013   Dr. Gala Romney- normal rectum, scattered L sided diverticula. bx= tubular adenoma   ESOPHAGOGASTRODUODENOSCOPY  12/21/2006     Dr. Vivi Ferns esophagus/Patulous EG junction, small to moderate sized hiatal hernia,   multiple fundal gland type appearing polyps biopsied, otherwise  normal   FOOT SURGERY     cyst removed   TOTAL ABDOMINAL HYSTERECTOMY     TOTAL HIP ARTHROPLASTY Right 01/11/2017   Procedure: RIGHT TOTAL HIP ARTHROPLASTY ANTERIOR APPROACH;  Surgeon: Gaynelle Arabian, MD;  Location: [facility];  Service: Orthopedics;  Laterality: Right;   WRIST SURGERY      Current Outpatient Medications  Medication Sig Dispense Refill   acetaminophen (TYLENOL) 500 MG tablet Take by mouth.     amLODipine (NORVASC) 5 MG tablet      aspirin EC 81 MG tablet Take 81 mg by mouth daily.     calcitonin, salmon, (MIACALCIN/FORTICAL) 200 UNIT/ACT nasal spray      CALCIUM PO Take by mouth daily.     cetirizine  (ZYRTEC) 10 MG tablet Take 10 mg by mouth daily.     estradiol (ESTRACE) 0.1 MG/GM vaginal cream Place 1 Applicatorful vaginally 3 (three) times a week. 42.5 g 3   ferrous sulfate 325 (65 FE) MG tablet Take 1 tablet by mouth daily.     fludrocortisone (FLORINEF) 0.1 MG tablet TAKE 1 TABLET THREE TIMES A WEEK 36 tablet 3   fluticasone (FLONASE) 50 MCG/ACT nasal spray 2 sprays into each nostril daily.     furosemide (LASIX) 20 MG tablet Take 20 mg by mouth daily as needed.     ipratropium (ATROVENT) 0.06 % nasal spray Place 1 spray into the nose as needed.     Lactobacillus (PROBIOTIC ACIDOPHILUS PO) Take by mouth daily.     levothyroxine (SYNTHROID, LEVOTHROID) 75 MCG tablet Take 75 mcg by mouth daily before breakfast.     lisinopril (ZESTRIL) 5 MG tablet      Probiotic Product (PROBIOTIC PO) Take by mouth daily.     RABEprazole (ACIPHEX) 20 MG tablet Take by mouth.     topiramate (TOPAMAX) 50 MG tablet TAKE 2 TABLETS IN THE MORNING AND 3 TABLETS AT NIGHT (Patient taking differently: 50 mg at bedtime.) 450 tablet 3   triamcinolone cream (KENALOG) 0.1 % triamcinolone acetonide 0.1 % topical cream     No current facility-administered medications for this visit.   Allergies:  Cephalexin, Doxazosin mesylate, Iodine, Levofloxacin, Codeine, Indomethacin, Ivp dye [iodinated diagnostic agents], Morphine, Penicillamine, Sulfamethoxazole-trimethoprim, Trimethoprim, and Penicillins   Social History: The patient  reports that she has never smoked. She has never used smokeless tobacco. She reports that she does not drink alcohol and does not use drugs.   Family History: The patient's family history includes Stroke in her father.   ROS:  Please see the history of present illness. Otherwise, complete review of systems is positive for none.  All other systems are reviewed and negative.   Physical Exam: VS:  BP 136/80   Pulse 80   Ht 5\' 8"  (1.727 m)   Wt 179 lb 3.2 oz (81.3 kg)   SpO2 95%   BMI 27.25  kg/m , BMI Body mass index is 27.25 kg/m.  Wt Readings from Last 3 Encounters:  10/07/21 179 lb 3.2 oz (81.3 kg)  09/08/20 166 lb 12.8 oz (75.7 kg)  04/23/20 170 lb (77.1 kg)    General: Patient appears comfortable at rest. Neck: Supple, no elevated JVP or carotid bruits, no thyromegaly. Lungs: Clear to auscultation, nonlabored breathing at rest. Cardiac: Regular rate and rhythm, no S3, 2 out of 6 significant systolic murmur, no pericardial rub. Extremities: 2+ pitting edema, distal pulses 2+. Skin: Warm and dry. Musculoskeletal: No kyphosis. Neuropsychiatric: Alert and oriented x3, affect grossly appropriate.  ECG:  EKG [facility] June 18, 2021 sinus rhythm with premature atrial complexes rate of 62  Recent Labwork: No results found for requested labs within last 8760 hours.  No results found for: CHOL, TRIG, HDL, CHOLHDL, VLDL, LDLCALC, LDLDIRECT  Other Studies Reviewed Today:  Echocardiogram 01/24/2018: Study Conclusions   - Left ventricle: The cavity size was normal. Wall thickness was   normal. Systolic function was normal. The estimated ejection   fraction was in the range of 60% to 65%. Wall motion was normal;   there were no regional wall motion abnormalities. - Aortic valve: Valve area (VTI): 3.17 cm^2. Valve area (Vmax):   2.76 cm^2. Valve area (Vmean): 2.76 cm^2. - Mitral valve: There was mild regurgitation. - Right atrium: The atrium was mildly dilated. - Atrial septum: No defect or patent foramen ovale was identified. - Tricuspid valve: There was moderate regurgitation. - Pulmonary arteries: Systolic pressure was moderately increased.   PA peak pressure: 50 mm Hg (S).    Assessment and Plan:  1. Bilateral leg edema   2. Chronic diastolic heart failure (HCC)   3. Pulmonary hypertension, unspecified ([place])   4. Anemia, unspecified type    1. Bilateral leg edema She has bilateral 2+ edema today.  She was referred here by hematology for her 16 pound  weight gain.  She states she has been having some significant swelling in her legs and her as needed Lasix appears not to be working.  2. Chronic diastolic heart failure (HCC) History of diastolic heart failure with last echocardiogram In February 2019 with a EF of 60 to 65%.  No WMA's, there was mild MR, there was moderate tricuspid regurgitation.  PASP was 50 mmHg.  She is complaining of some increase shortness of breath, weight gain and lower extremity edema.  Please get a follow-up echocardiogram to reassess LV function, diastolic function and valvular function.  Advised patient to start taking Lasix 40 mg daily for the next week then revert back to 20 mg daily.  Get a basic metabolic panel and magnesium in 2 weeks.  Advised daughter to call back with weights, and to see if edema has decreased in 2 weeks.  3. Pulmonary hypertension, unspecified ([place]) Last echocardiogram demonstrated PASP EF 61mmHg.  Complaining of lower extremity edema, weight gain.  We have ordered a follow-up echocardiogram to reassess pulmonary pressures.  4. Anemia, unspecified type Recent CBC on 10/05/2021 hemoglobin 9.2 hematocrit 31.  She sees hematology  5. Nonrheumatic mitral valve regurgitation Last echocardiogram February 2018 with mild MR and moderate TR.  We are getting a follow-up echocardiogram to reassess mitral valve function as well as LV function, diastolic function and pulmonary pressures.    Medication Adjustments/Labs and Tests Ordered: Current medicines are reviewed at length with the patient today.  Concerns regarding medicines are outlined above.   Disposition: Follow-up with Dr. Domenic Polite or APP 1 month  Signed, Levell July, NP 10/07/2021 4:38 PM    [facility], [place], [place] [address] Phone: [phone]; Fax: [phone]

## 2021-10-07 ENCOUNTER — Other Ambulatory Visit: Payer: Self-pay | Admitting: *Deleted

## 2021-10-07 ENCOUNTER — Encounter: Payer: Self-pay | Admitting: Family Medicine

## 2021-10-07 ENCOUNTER — Ambulatory Visit (INDEPENDENT_AMBULATORY_CARE_PROVIDER_SITE_OTHER): Payer: Medicare Other | Admitting: Family Medicine

## 2021-10-07 VITALS — BP 136/80 | HR 80 | Ht 68.0 in | Wt 179.2 lb

## 2021-10-07 DIAGNOSIS — I272 Pulmonary hypertension, unspecified: Secondary | ICD-10-CM

## 2021-10-07 DIAGNOSIS — R011 Cardiac murmur, unspecified: Secondary | ICD-10-CM

## 2021-10-07 DIAGNOSIS — R600 Localized edema: Secondary | ICD-10-CM

## 2021-10-07 DIAGNOSIS — I340 Nonrheumatic mitral (valve) insufficiency: Secondary | ICD-10-CM | POA: Diagnosis not present

## 2021-10-07 DIAGNOSIS — D649 Anemia, unspecified: Secondary | ICD-10-CM | POA: Diagnosis not present

## 2021-10-07 DIAGNOSIS — Z79899 Other long term (current) drug therapy: Secondary | ICD-10-CM

## 2021-10-07 DIAGNOSIS — I5032 Chronic diastolic (congestive) heart failure: Secondary | ICD-10-CM

## 2021-10-07 MED ORDER — FUROSEMIDE 20 MG PO TABS: 40.0000 mg | ORAL_TABLET | Freq: Every day | ORAL | 6 refills | Status: DC

## 2021-10-07 NOTE — Patient Instructions (Addendum)
Medication Instructions:  Your physician has recommended you make the following change in your medication:  Take furosemide 40 mg daily for 7 days, then reduce to 20 mg daily Continue other medications the same  Labwork: BMET & MG in 2 weeks (10/22/2021) at [facility] No appointment needed  Testing/Procedures: Your physician has requested that you have an echocardiogram. Echocardiography is a painless test that uses sound waves to create images of your heart. It provides your doctor with information about the size and shape of your heart and how well your heart's chambers and valves are working. This procedure takes approximately one hour. There are no restrictions for this procedure.  Follow-Up: Your physician recommends that you schedule a follow-up appointment in: 1 month  Any Other Special Instructions Will Be Listed Below (If Applicable).  If you need a refill on your cardiac medications before your next appointment, please call your pharmacy.

## 2021-10-08 ENCOUNTER — Encounter: Payer: Self-pay | Admitting: Family Medicine

## 2021-10-09 DIAGNOSIS — U071 COVID-19: Secondary | ICD-10-CM | POA: Diagnosis not present

## 2021-10-09 DIAGNOSIS — I5032 Chronic diastolic (congestive) heart failure: Secondary | ICD-10-CM | POA: Diagnosis not present

## 2021-10-09 DIAGNOSIS — M19072 Primary osteoarthritis, left ankle and foot: Secondary | ICD-10-CM | POA: Diagnosis not present

## 2021-10-09 DIAGNOSIS — Z7982 Long term (current) use of aspirin: Secondary | ICD-10-CM | POA: Diagnosis not present

## 2021-10-09 DIAGNOSIS — I739 Peripheral vascular disease, unspecified: Secondary | ICD-10-CM | POA: Diagnosis not present

## 2021-10-09 DIAGNOSIS — I110 Hypertensive heart disease with heart failure: Secondary | ICD-10-CM | POA: Diagnosis not present

## 2021-10-10 DIAGNOSIS — Z9981 Dependence on supplemental oxygen: Secondary | ICD-10-CM | POA: Diagnosis not present

## 2021-10-10 DIAGNOSIS — I110 Hypertensive heart disease with heart failure: Secondary | ICD-10-CM | POA: Diagnosis not present

## 2021-10-10 DIAGNOSIS — Z8616 Personal history of COVID-19: Secondary | ICD-10-CM | POA: Diagnosis not present

## 2021-10-10 DIAGNOSIS — Z7951 Long term (current) use of inhaled steroids: Secondary | ICD-10-CM | POA: Diagnosis not present

## 2021-10-10 DIAGNOSIS — Z9181 History of falling: Secondary | ICD-10-CM | POA: Diagnosis not present

## 2021-10-10 DIAGNOSIS — Z7982 Long term (current) use of aspirin: Secondary | ICD-10-CM | POA: Diagnosis not present

## 2021-10-10 DIAGNOSIS — M19072 Primary osteoarthritis, left ankle and foot: Secondary | ICD-10-CM | POA: Diagnosis not present

## 2021-10-10 DIAGNOSIS — I5032 Chronic diastolic (congestive) heart failure: Secondary | ICD-10-CM | POA: Diagnosis not present

## 2021-10-10 DIAGNOSIS — Z7952 Long term (current) use of systemic steroids: Secondary | ICD-10-CM | POA: Diagnosis not present

## 2021-10-10 DIAGNOSIS — I739 Peripheral vascular disease, unspecified: Secondary | ICD-10-CM | POA: Diagnosis not present

## 2021-10-11 ENCOUNTER — Telehealth: Payer: Self-pay | Admitting: Family Medicine

## 2021-10-11 NOTE — Telephone Encounter (Signed)
Patient informed and verbalized understanding of plan. 

## 2021-10-11 NOTE — Telephone Encounter (Signed)
Patient's daughter is wanting to know if they can alternate 40mg  of Lasix everyday to taking 40mg  one day and taking 20mg  the next.  It's too much for her taking 40mg  everyday.  She is very tired using the bathroom so much every morning. She called her daughter this morning in tears because it's just too much for her.    Daughter stated she has only lost 1lb and the swelling has slightly gone down.  Since it's not working as it was thought it would she was wondering if switching it to every other day the 40mg , to give her mother a little bit of a break.

## 2021-10-19 DIAGNOSIS — D649 Anemia, unspecified: Secondary | ICD-10-CM | POA: Diagnosis not present

## 2021-10-19 DIAGNOSIS — R600 Localized edema: Secondary | ICD-10-CM | POA: Diagnosis not present

## 2021-10-21 DIAGNOSIS — Z7982 Long term (current) use of aspirin: Secondary | ICD-10-CM | POA: Diagnosis not present

## 2021-10-21 DIAGNOSIS — I5032 Chronic diastolic (congestive) heart failure: Secondary | ICD-10-CM | POA: Diagnosis not present

## 2021-10-21 DIAGNOSIS — I110 Hypertensive heart disease with heart failure: Secondary | ICD-10-CM | POA: Diagnosis not present

## 2021-10-21 DIAGNOSIS — I739 Peripheral vascular disease, unspecified: Secondary | ICD-10-CM | POA: Diagnosis not present

## 2021-10-21 DIAGNOSIS — M19072 Primary osteoarthritis, left ankle and foot: Secondary | ICD-10-CM | POA: Diagnosis not present

## 2021-10-21 DIAGNOSIS — Z9981 Dependence on supplemental oxygen: Secondary | ICD-10-CM | POA: Diagnosis not present

## 2021-10-22 DIAGNOSIS — D509 Iron deficiency anemia, unspecified: Secondary | ICD-10-CM | POA: Diagnosis not present

## 2021-10-22 DIAGNOSIS — D649 Anemia, unspecified: Secondary | ICD-10-CM | POA: Diagnosis not present

## 2021-10-25 ENCOUNTER — Telehealth: Payer: Self-pay | Admitting: Internal Medicine

## 2021-10-25 NOTE — Telephone Encounter (Signed)
Pt is having one loose stool per day. Pt goes to the bathroom 3 to 4 times a day and has a bm every time she goes but only one of the bm's will be loose. Pt is having problems with her bottom being raw. Daughter states that they have not tried any kind of creams to treat the rawness. Pt's daughter has purchased a sitz bath online and is waiting for it to be delivered. Pt recently did a stool sample 6 weeks ago through her pcp for cdiff that came back negative. Pt is having a little bit of abdominal pain but is not having any nausea, vomiting, fever, or bloody stools. Pt was last seen on 09/08/2020. Pt uses [facility] in [place].

## 2021-10-25 NOTE — Telephone Encounter (Signed)
Pt's daughter and POA, Rafael Bihari, called asking to speak with nurse about patient. She is having fecal incontinence and her bottom is sore. I was going to offer an OV with RMR, but she said it was too hard bringing her mother out and wants to know if we could recommend something for her. [phone]

## 2021-10-25 NOTE — Telephone Encounter (Signed)
Informed pt's daughter and updated pt's med list as requested. Daughter will call with update on how the immodium twice daily is working for pt.

## 2021-10-27 ENCOUNTER — Telehealth: Payer: Self-pay | Admitting: *Deleted

## 2021-10-27 NOTE — Telephone Encounter (Signed)
-----   Message from Merlene Laughter, RN sent at 10/26/2021 11:25 AM EST -----  ----- Message ----- From: Verta Ellen., NP Sent: 10/24/2021   9:27 PM EST To: Laurine Blazer, LPN  Please call patient and let her know the labs look good.    Verta Ellen, NP  10/24/2021 9:25 PM

## 2021-10-27 NOTE — Telephone Encounter (Signed)
Patient informed. Copy sent to PCP °

## 2021-10-29 DIAGNOSIS — Z7982 Long term (current) use of aspirin: Secondary | ICD-10-CM | POA: Diagnosis not present

## 2021-10-29 DIAGNOSIS — I5032 Chronic diastolic (congestive) heart failure: Secondary | ICD-10-CM | POA: Diagnosis not present

## 2021-10-29 DIAGNOSIS — M19072 Primary osteoarthritis, left ankle and foot: Secondary | ICD-10-CM | POA: Diagnosis not present

## 2021-10-29 DIAGNOSIS — Z9981 Dependence on supplemental oxygen: Secondary | ICD-10-CM | POA: Diagnosis not present

## 2021-10-29 DIAGNOSIS — I739 Peripheral vascular disease, unspecified: Secondary | ICD-10-CM | POA: Diagnosis not present

## 2021-10-29 DIAGNOSIS — I110 Hypertensive heart disease with heart failure: Secondary | ICD-10-CM | POA: Diagnosis not present

## 2021-11-03 DIAGNOSIS — Z9981 Dependence on supplemental oxygen: Secondary | ICD-10-CM | POA: Diagnosis not present

## 2021-11-03 DIAGNOSIS — I5032 Chronic diastolic (congestive) heart failure: Secondary | ICD-10-CM | POA: Diagnosis not present

## 2021-11-03 DIAGNOSIS — M19072 Primary osteoarthritis, left ankle and foot: Secondary | ICD-10-CM | POA: Diagnosis not present

## 2021-11-03 DIAGNOSIS — I739 Peripheral vascular disease, unspecified: Secondary | ICD-10-CM | POA: Diagnosis not present

## 2021-11-03 DIAGNOSIS — Z7982 Long term (current) use of aspirin: Secondary | ICD-10-CM | POA: Diagnosis not present

## 2021-11-03 DIAGNOSIS — I110 Hypertensive heart disease with heart failure: Secondary | ICD-10-CM | POA: Diagnosis not present

## 2021-11-04 ENCOUNTER — Ambulatory Visit (INDEPENDENT_AMBULATORY_CARE_PROVIDER_SITE_OTHER): Payer: Medicare Other

## 2021-11-04 DIAGNOSIS — I340 Nonrheumatic mitral (valve) insufficiency: Secondary | ICD-10-CM | POA: Diagnosis not present

## 2021-11-04 DIAGNOSIS — R600 Localized edema: Secondary | ICD-10-CM | POA: Diagnosis not present

## 2021-11-04 LAB — ECHOCARDIOGRAM COMPLETE
AR max vel: 1.75 cm2
AV Area VTI: 1.91 cm2
AV Area mean vel: 1.69 cm2
AV Mean grad: 7.1 mmHg
AV Peak grad: 13 mmHg
Ao pk vel: 1.8 m/s
Area-P 1/2: 3.02 cm2
S' Lateral: 3.03 cm
Single Plane A4C EF: 69.3 %

## 2021-11-07 NOTE — Progress Notes (Signed)
Cardiology Office Note  Date: 11/08/2021   ID: [name], DOB 10-10-1930, MRN [id number]  PCP:  Manon Hilding, MD  Cardiologist:  Rozann Lesches, MD Electrophysiologist:  None   Chief Complaint: Follow up lower extremity edema  History of Present Illness: [name] is a 87 y.o. female with a history of chronic diastolic heart failure, HTN, PACs, MR, vasovagal syncope, exertional dyspnea, chest pain, seizures, anemia  She was last seen by Dr. Domenic Polite via telemedicine on 01/06/2020 for bilateral leg edema.  She complained of leg pain, swelling, skin tightness with some erythema.  This had been chronic recurrent problem.  She had not tried to increase her Lasix.  Her current medication regimen included low-dose aspirin, Lasix 20 mg daily and Florinef.   Dr. Domenic Polite mentioned given her chronic recurrent bilateral leg edema and association with diastolic dysfunction and pulmonary hypertension plan was to continue to elevate legs when able and watch salt in diet.  Lasix was increased to 20 mg alternating with 40 mg daily to see if this would be helpful.  He mentioned may need to consider Demadex if higher dose of Lasix was not effective.  Given her history of neurocardiogenic syncope she remained on low-dose Florinef.  Was last here for weight gain and lower extremity edema worse over the prior 2 weeks. She saw hematology on 10/06/2021 who suggested she follow-up due to weight gain.  Her weight was up 16 pounds at 176 pounds since the prior month when she weighed 160 at hematology office.  Her daughter mentioned she had been retaining fluid in her legs for prior 2 weeks.  Lasix as needed was not working.  Leg edema was not going down after sleeping.  She had 2+ edema.   Her weight  was 179.  She did have continuing 2+ edema.  She complained of some increased activity intolerance and shortness of breath.  Her prior echocardiogram On January 24, 2018 demonstrated EF of 60 to 65%.   Mild MR, moderate tricuspid regurgitation.  Pulmonary artery systolic pressure of 50 mmHg.  He is here for follow-up after adjusting her Lasix due to weight gain and lower extremity edema and last visit.  She has lost approximately 12 pounds since October 07, 2021.  We rechecked her echocardiogram On 11/04/2021 which demonstrated an EF of 55 to 60%.  No WMA's.  Indeterminate diastolic parameters.  She had severely elevated pulmonary artery systolic pressure of 40.1.  Her tricuspid valve regurgitation was moderate to severe.  Her mitral valve regurgitation was mild.  We increased her Lasix for 1 week to 40 mg and she was to revert back to 20 mg daily thereafter.  Her weight has decreased from 179 on 10/07/2021 down to 167 today.  Her lower extremity edema has significantly decreased.  She continues with some activity intolerance and DOE.  Daughter states she has started giving her mother Lasix 20 mg every other day.  Recheck of labs demonstrated normal electrolytes with creatinine of 1.06 and GFR of 50.  Patient states she is having more issues with diarrhea and pain in and around her rectal area.  Daughter states they have a follow-up with primary care and will discuss the diarrhea and rectal pain with him.  Blood pressure today is well controlled at 114/64.   Past Medical History:  Diagnosis Date   Arthritis    Chronic diastolic heart failure ([place])    Diverticulosis of colon (without mention of hemorrhage)    TCS  by Dr. Gala Romney   GERD (gastroesophageal reflux disease)    Hemorrhoids, internal    TCS by Dr. Gala Romney   Irritable bowel syndrome    Migraine    Mitral regurgitation    Peripheral polyneuropathy    Confirmed by EMG and nerve conduction velocities    Pulmonary nodule    Seizures ([place]) 2011   Syncope    Neurocardiogenic - positive tilt table test, on Florinef   Tubular adenoma    UTI (urinary tract infection)    Recurrent    Past Surgical History:  Procedure Laterality Date    APPENDECTOMY     Back Pain     going to chiropracator for back and hip pain   BACK SURGERY     x2   bladder tack     CATARACT EXTRACTION, BILATERAL  2017   CHOLECYSTECTOMY     COLONOSCOPY  06/16/2005   internal hemorrhoids, L side diverticula - Dr. Gala Romney   COLONOSCOPY N/A 05/02/2013   Dr. Gala Romney- normal rectum, scattered L sided diverticula. bx= tubular adenoma   ESOPHAGOGASTRODUODENOSCOPY  12/21/2006     Dr. Vivi Ferns esophagus/Patulous EG junction, small to moderate sized hiatal hernia,   multiple fundal gland type appearing polyps biopsied, otherwise normal   FOOT SURGERY     cyst removed   TOTAL ABDOMINAL HYSTERECTOMY     TOTAL HIP ARTHROPLASTY Right 01/11/2017   Procedure: RIGHT TOTAL HIP ARTHROPLASTY ANTERIOR APPROACH;  Surgeon: Gaynelle Arabian, MD;  Location: [facility];  Service: Orthopedics;  Laterality: Right;   WRIST SURGERY      Current Outpatient Medications  Medication Sig Dispense Refill   acetaminophen (TYLENOL) 650 MG CR tablet Take 1,300 mg by mouth in the morning and at bedtime.     aspirin EC 81 MG tablet Take 81 mg by mouth daily.     calcitonin, salmon, (MIACALCIN/FORTICAL) 200 UNIT/ACT nasal spray Place 2 sprays into alternate nostrils daily.     cetirizine (ZYRTEC) 10 MG tablet Take 10 mg by mouth daily.     estradiol (ESTRACE) 0.1 MG/GM vaginal cream Place 1 Applicatorful vaginally 3 (three) times a week. 42.5 g 3   fludrocortisone (FLORINEF) 0.1 MG tablet TAKE 1 TABLET THREE TIMES A WEEK 36 tablet 3   fluticasone (FLONASE) 50 MCG/ACT nasal spray 2 sprays into each nostril daily.     furosemide (LASIX) 20 MG tablet Take 20 mg by mouth every other day.     Lactobacillus (PROBIOTIC ACIDOPHILUS PO) Take 1 capsule by mouth daily.     levothyroxine (SYNTHROID, LEVOTHROID) 75 MCG tablet Take 75 mcg by mouth daily before breakfast.     lisinopril (ZESTRIL) 5 MG tablet Take 5 mg by mouth daily.     Multiple Vitamins-Minerals (CENTRUM SILVER 50+WOMEN PO) Take 1 tablet by  mouth daily.     nitroGLYCERIN (NITROSTAT) 0.4 MG SL tablet Place 0.4 mg under the tongue every 5 (five) minutes as needed for chest pain.     potassium chloride SA (KLOR-CON) 20 MEQ tablet Take 10 mEq by mouth 2 (two) times daily.     RABEprazole (ACIPHEX) 20 MG tablet Take 20 mg by mouth daily.     topiramate (TOPAMAX) 50 MG tablet Take 50 mg by mouth daily.     No current facility-administered medications for this visit.   Allergies:  Cephalexin, Doxazosin mesylate, Iodine, Levofloxacin, Codeine, Indomethacin, Ivp dye [iodinated diagnostic agents], Morphine, Penicillamine, Sulfamethoxazole-trimethoprim, Trimethoprim, and Penicillins   Social History: The patient  reports that she has never smoked.  She has never used smokeless tobacco. She reports that she does not drink alcohol and does not use drugs.   Family History: The patient's family history includes Stroke in her father.   ROS:  Please see the history of present illness. Otherwise, complete review of systems is positive for none.  All other systems are reviewed and negative.   Physical Exam: VS:  BP 114/64   Pulse 64   Ht 5' 8.5" (1.74 m)   Wt 167 lb (75.8 kg)   SpO2 95%   BMI 25.02 kg/m , BMI Body mass index is 25.02 kg/m.  Wt Readings from Last 3 Encounters:  11/08/21 167 lb (75.8 kg)  10/07/21 179 lb 3.2 oz (81.3 kg)  09/08/20 166 lb 12.8 oz (75.7 kg)    General: Patient appears comfortable at rest. Neck: Supple, no elevated JVP or carotid bruits, no thyromegaly. Lungs: Clear to auscultation, nonlabored breathing at rest. Cardiac: Regular rate and rhythm, no S3, 2 out of 6 significant systolic murmur, no pericardial rub. Extremities: No pitting edema, distal pulses 2+. Skin: Warm and dry. Musculoskeletal: No kyphosis. Neuropsychiatric: Alert and oriented x3, affect grossly appropriate.  ECG:  EKG [facility] June 18, 2021 sinus rhythm with premature atrial complexes rate of 62  Recent Labwork: No results  found for requested labs within last 8760 hours.  No results found for: CHOL, TRIG, HDL, CHOLHDL, VLDL, LDLCALC, LDLDIRECT  Other Studies Reviewed Today:  Echocardiogram 11/04/2021 1. Left ventricular ejection fraction, by estimation, is 55 to 60%. The  left ventricle has normal function. The left ventricle has no regional  wall motion abnormalities. Left ventricular diastolic parameters are  indeterminate.   2. Right ventricular systolic function is normal. The right ventricular  size is normal. There is severely elevated pulmonary artery systolic  pressure.   3. Left atrial size was mildly dilated.   4. Right atrial size was mildly dilated.   5. The mitral valve is abnormal. Mild mitral valve regurgitation. No  evidence of mitral stenosis.   6. The tricuspid valve is abnormal. Tricuspid valve regurgitation is  moderate to severe.   7. The aortic valve is tricuspid. Aortic valve regurgitation is not  visualized. No aortic stenosis is present.   8. The inferior vena cava is normal in size with greater than 50%  respiratory variability, suggesting right atrial pressure of 3 mmHg.   The estimated right ventricular systolic pressure is 84.1 mmHg.  Comparison(s): Previous Echo showed LV EF 60-65%, no RWMA, mild MR,  moderate TR. PASP 50 mmHg.    Echocardiogram 01/24/2018: Study Conclusions   - Left ventricle: The cavity size was normal. Wall thickness was   normal. Systolic function was normal. The estimated ejection   fraction was in the range of 60% to 65%. Wall motion was normal;   there were no regional wall motion abnormalities. - Aortic valve: Valve area (VTI): 3.17 cm^2. Valve area (Vmax):   2.76 cm^2. Valve area (Vmean): 2.76 cm^2. - Mitral valve: There was mild regurgitation. - Right atrium: The atrium was mildly dilated. - Atrial septum: No defect or patent foramen ovale was identified. - Tricuspid valve: There was moderate regurgitation. - Pulmonary arteries: Systolic  pressure was moderately increased.   PA peak pressure: 50 mm Hg (S).    Assessment and Plan:  1. Bilateral leg edema   2. Chronic diastolic heart failure (HCC)   3. Pulmonary hypertension, unspecified ([place])   4. Anemia, unspecified type   5. Nonrheumatic mitral valve regurgitation  1. Bilateral leg edema At last visit Lasix was increased to 40 mg daily for 1 week and patient was to revert back to 20 mg daily thereafter.  She is lost approximately 12 pounds since medication changes and her bilateral lower extremity edema has resolved.  Her weight today is 167.  Her weight at last visit was 179.  Daughter states she has started giving the Lasix 20 mg every other day for now but is aware she can give it on a daily basis if needed.  2. Chronic diastolic heart failure ([place]) At last visit she had 2+ edema and increased weight.  Her Lasix was adjusted as mentioned above in #1.  She has lost approximately 12 pounds since October 07, 2021.  We rechecked her echocardiogram On 11/04/2021 which demonstrated an EF of 55 to 60%.  No WMA's.  Indeterminate diastolic parameters.  She had severely elevated pulmonary artery systolic pressure of 67.2.  Her tricuspid valve regurgitation was moderate to severe.  Her mitral valve regurgitation was mild.  At last visit her Lasix was increased to 40 mg daily for 1 week and she was to revert back to 20 mg daily.  Her daughter states she adjusted her Lasix to 20 mg every other day and her mother is maintaining weights around the 165 to 167 pound range which is her baseline per her daughter.  Daughter states she is aware she can give the 20 mg of Lasix daily if needed.  3. Pulmonary hypertension, unspecified ([place]) L recent echocardiogram demonstrated an increase from pulmonary artery systolic pressure up to 09.4 mmHg and increased to 10.8 mmHg since previous echo.  She has moderate to severe tricuspid regurgitation.  Prior echocardiogram demonstrated PASP EF 66mmHg.   Advised to continue Lasix daily and to monitor weight and lower extremity edema along with shortness of breath.  Daughter is aware she can adjust Lasix up to daily if needed at 20 mg.  4. Anemia, unspecified type She sees hematology.  Recent CBC on 10/19/2021 demonstrated hemoglobin of 9.1 and hematocrit of 30.9.  5. Nonrheumatic mitral valve regurgitation Recent echocardiogram demonstrated mild mitral regurgitation no stenosis recent CBC on 10/19/2021 demonstrated hemoglobin of 9.1 and hematocrit of 30.9.    Medication Adjustments/Labs and Tests Ordered: Current medicines are reviewed at length with the patient today.  Concerns regarding medicines are outlined above.   Disposition: Follow-up with Dr. Domenic Polite or APP 6 months  Signed, Levell July, NP 11/08/2021 2:16 PM    [facility] at [place], [place], [place] [address] Phone: [phone]; Fax: [phone]

## 2021-11-08 ENCOUNTER — Ambulatory Visit (INDEPENDENT_AMBULATORY_CARE_PROVIDER_SITE_OTHER): Payer: Medicare Other | Admitting: Family Medicine

## 2021-11-08 ENCOUNTER — Encounter: Payer: Self-pay | Admitting: Family Medicine

## 2021-11-08 ENCOUNTER — Telehealth: Payer: Self-pay | Admitting: *Deleted

## 2021-11-08 VITALS — BP 114/64 | HR 64 | Ht 68.5 in | Wt 167.0 lb

## 2021-11-08 DIAGNOSIS — D649 Anemia, unspecified: Secondary | ICD-10-CM

## 2021-11-08 DIAGNOSIS — I340 Nonrheumatic mitral (valve) insufficiency: Secondary | ICD-10-CM | POA: Diagnosis not present

## 2021-11-08 DIAGNOSIS — I5032 Chronic diastolic (congestive) heart failure: Secondary | ICD-10-CM | POA: Diagnosis not present

## 2021-11-08 DIAGNOSIS — R600 Localized edema: Secondary | ICD-10-CM | POA: Diagnosis not present

## 2021-11-08 DIAGNOSIS — I272 Pulmonary hypertension, unspecified: Secondary | ICD-10-CM

## 2021-11-08 NOTE — Telephone Encounter (Signed)
-----   Message from Verta Ellen., NP sent at 11/04/2021  9:32 PM EST ----- Please call the patient and let her know the echocardiogram showed she has good pumping function of the heart.  The pressures in the right side of her heart are significantly elevated.  This is called pulmonary hypertension.  She also has a moderate to severe leak in the valve on the right side of her heart which can cause increased pulmonary hypertension.  This was present on previous echo but has increased some since last study.  She has a mild leak in her mitral valve.  This was present on previous echocardiogram also.  Verta Ellen, NP  11/04/2021 9:28 PM

## 2021-11-08 NOTE — Patient Instructions (Addendum)

## 2021-11-09 DIAGNOSIS — Z9181 History of falling: Secondary | ICD-10-CM | POA: Diagnosis not present

## 2021-11-09 DIAGNOSIS — Z9981 Dependence on supplemental oxygen: Secondary | ICD-10-CM | POA: Diagnosis not present

## 2021-11-09 DIAGNOSIS — M19072 Primary osteoarthritis, left ankle and foot: Secondary | ICD-10-CM | POA: Diagnosis not present

## 2021-11-09 DIAGNOSIS — I5032 Chronic diastolic (congestive) heart failure: Secondary | ICD-10-CM | POA: Diagnosis not present

## 2021-11-09 DIAGNOSIS — Z7952 Long term (current) use of systemic steroids: Secondary | ICD-10-CM | POA: Diagnosis not present

## 2021-11-09 DIAGNOSIS — Z8616 Personal history of COVID-19: Secondary | ICD-10-CM | POA: Diagnosis not present

## 2021-11-09 DIAGNOSIS — I110 Hypertensive heart disease with heart failure: Secondary | ICD-10-CM | POA: Diagnosis not present

## 2021-11-09 DIAGNOSIS — I739 Peripheral vascular disease, unspecified: Secondary | ICD-10-CM | POA: Diagnosis not present

## 2021-11-09 DIAGNOSIS — Z7951 Long term (current) use of inhaled steroids: Secondary | ICD-10-CM | POA: Diagnosis not present

## 2021-11-09 DIAGNOSIS — Z7982 Long term (current) use of aspirin: Secondary | ICD-10-CM | POA: Diagnosis not present

## 2021-11-09 NOTE — Telephone Encounter (Signed)
Patient informed. Copy sent to PCP °

## 2021-11-09 NOTE — Telephone Encounter (Signed)
Follow Up:      Daugter is  returning call from yesterday concerning patient's results.

## 2021-11-10 DIAGNOSIS — M19072 Primary osteoarthritis, left ankle and foot: Secondary | ICD-10-CM | POA: Diagnosis not present

## 2021-11-10 DIAGNOSIS — I739 Peripheral vascular disease, unspecified: Secondary | ICD-10-CM | POA: Diagnosis not present

## 2021-11-10 DIAGNOSIS — Z9981 Dependence on supplemental oxygen: Secondary | ICD-10-CM | POA: Diagnosis not present

## 2021-11-10 DIAGNOSIS — I5032 Chronic diastolic (congestive) heart failure: Secondary | ICD-10-CM | POA: Diagnosis not present

## 2021-11-10 DIAGNOSIS — I110 Hypertensive heart disease with heart failure: Secondary | ICD-10-CM | POA: Diagnosis not present

## 2021-11-10 DIAGNOSIS — Z7982 Long term (current) use of aspirin: Secondary | ICD-10-CM | POA: Diagnosis not present

## 2021-11-16 DIAGNOSIS — K644 Residual hemorrhoidal skin tags: Secondary | ICD-10-CM | POA: Diagnosis not present

## 2021-11-16 DIAGNOSIS — N183 Chronic kidney disease, stage 3 unspecified: Secondary | ICD-10-CM | POA: Diagnosis not present

## 2021-11-16 DIAGNOSIS — E870 Hyperosmolality and hypernatremia: Secondary | ICD-10-CM | POA: Diagnosis not present

## 2021-11-16 DIAGNOSIS — D638 Anemia in other chronic diseases classified elsewhere: Secondary | ICD-10-CM | POA: Diagnosis not present

## 2021-11-16 DIAGNOSIS — Z23 Encounter for immunization: Secondary | ICD-10-CM | POA: Diagnosis not present

## 2021-11-16 DIAGNOSIS — I872 Venous insufficiency (chronic) (peripheral): Secondary | ICD-10-CM | POA: Diagnosis not present

## 2021-11-16 DIAGNOSIS — G40909 Epilepsy, unspecified, not intractable, without status epilepticus: Secondary | ICD-10-CM | POA: Diagnosis not present

## 2021-11-16 DIAGNOSIS — R600 Localized edema: Secondary | ICD-10-CM | POA: Diagnosis not present

## 2021-11-18 DIAGNOSIS — I739 Peripheral vascular disease, unspecified: Secondary | ICD-10-CM | POA: Diagnosis not present

## 2021-11-18 DIAGNOSIS — Z9981 Dependence on supplemental oxygen: Secondary | ICD-10-CM | POA: Diagnosis not present

## 2021-11-18 DIAGNOSIS — I110 Hypertensive heart disease with heart failure: Secondary | ICD-10-CM | POA: Diagnosis not present

## 2021-11-18 DIAGNOSIS — M19072 Primary osteoarthritis, left ankle and foot: Secondary | ICD-10-CM | POA: Diagnosis not present

## 2021-11-18 DIAGNOSIS — I5032 Chronic diastolic (congestive) heart failure: Secondary | ICD-10-CM | POA: Diagnosis not present

## 2021-11-18 DIAGNOSIS — Z7982 Long term (current) use of aspirin: Secondary | ICD-10-CM | POA: Diagnosis not present

## 2021-11-19 DIAGNOSIS — D696 Thrombocytopenia, unspecified: Secondary | ICD-10-CM | POA: Diagnosis not present

## 2021-11-22 ENCOUNTER — Other Ambulatory Visit (HOSPITAL_COMMUNITY): Payer: Medicare Other

## 2021-11-23 DIAGNOSIS — R059 Cough, unspecified: Secondary | ICD-10-CM | POA: Diagnosis not present

## 2021-11-23 DIAGNOSIS — J069 Acute upper respiratory infection, unspecified: Secondary | ICD-10-CM | POA: Diagnosis not present

## 2021-11-26 DIAGNOSIS — Z7982 Long term (current) use of aspirin: Secondary | ICD-10-CM | POA: Diagnosis not present

## 2021-11-26 DIAGNOSIS — E876 Hypokalemia: Secondary | ICD-10-CM | POA: Diagnosis not present

## 2021-11-26 DIAGNOSIS — I739 Peripheral vascular disease, unspecified: Secondary | ICD-10-CM | POA: Diagnosis not present

## 2021-11-26 DIAGNOSIS — M069 Rheumatoid arthritis, unspecified: Secondary | ICD-10-CM | POA: Diagnosis not present

## 2021-11-26 DIAGNOSIS — D649 Anemia, unspecified: Secondary | ICD-10-CM | POA: Diagnosis not present

## 2021-11-26 DIAGNOSIS — I110 Hypertensive heart disease with heart failure: Secondary | ICD-10-CM | POA: Diagnosis not present

## 2021-11-26 DIAGNOSIS — M19072 Primary osteoarthritis, left ankle and foot: Secondary | ICD-10-CM | POA: Diagnosis not present

## 2021-11-26 DIAGNOSIS — I5032 Chronic diastolic (congestive) heart failure: Secondary | ICD-10-CM | POA: Diagnosis not present

## 2021-11-26 DIAGNOSIS — Z9981 Dependence on supplemental oxygen: Secondary | ICD-10-CM | POA: Diagnosis not present

## 2021-11-26 DIAGNOSIS — D591 Autoimmune hemolytic anemia, unspecified: Secondary | ICD-10-CM | POA: Diagnosis not present

## 2021-11-30 ENCOUNTER — Ambulatory Visit (INDEPENDENT_AMBULATORY_CARE_PROVIDER_SITE_OTHER): Payer: Medicare Other | Admitting: Urology

## 2021-11-30 ENCOUNTER — Other Ambulatory Visit: Payer: Self-pay

## 2021-11-30 ENCOUNTER — Encounter: Payer: Self-pay | Admitting: Urology

## 2021-11-30 VITALS — BP 121/74 | HR 70

## 2021-11-30 DIAGNOSIS — Z7952 Long term (current) use of systemic steroids: Secondary | ICD-10-CM | POA: Diagnosis not present

## 2021-11-30 DIAGNOSIS — M19072 Primary osteoarthritis, left ankle and foot: Secondary | ICD-10-CM | POA: Diagnosis not present

## 2021-11-30 DIAGNOSIS — Z9981 Dependence on supplemental oxygen: Secondary | ICD-10-CM | POA: Diagnosis not present

## 2021-11-30 DIAGNOSIS — N952 Postmenopausal atrophic vaginitis: Secondary | ICD-10-CM | POA: Diagnosis not present

## 2021-11-30 DIAGNOSIS — N390 Urinary tract infection, site not specified: Secondary | ICD-10-CM | POA: Diagnosis not present

## 2021-11-30 DIAGNOSIS — Z7951 Long term (current) use of inhaled steroids: Secondary | ICD-10-CM | POA: Diagnosis not present

## 2021-11-30 DIAGNOSIS — Z7982 Long term (current) use of aspirin: Secondary | ICD-10-CM | POA: Diagnosis not present

## 2021-11-30 DIAGNOSIS — I739 Peripheral vascular disease, unspecified: Secondary | ICD-10-CM | POA: Diagnosis not present

## 2021-11-30 DIAGNOSIS — I5032 Chronic diastolic (congestive) heart failure: Secondary | ICD-10-CM | POA: Diagnosis not present

## 2021-11-30 DIAGNOSIS — I110 Hypertensive heart disease with heart failure: Secondary | ICD-10-CM | POA: Diagnosis not present

## 2021-11-30 LAB — URINALYSIS, ROUTINE W REFLEX MICROSCOPIC
Bilirubin, UA: NEGATIVE
Glucose, UA: NEGATIVE
Ketones, UA: NEGATIVE
Nitrite, UA: POSITIVE — AB
Protein,UA: NEGATIVE
RBC, UA: NEGATIVE
Specific Gravity, UA: 1.02 (ref 1.005–1.030)
Urobilinogen, Ur: 0.2 mg/dL (ref 0.2–1.0)
pH, UA: 6 (ref 5.0–7.5)

## 2021-11-30 LAB — MICROSCOPIC EXAMINATION
RBC, Urine: NONE SEEN /hpf (ref 0–2)
Renal Epithel, UA: NONE SEEN /hpf
WBC, UA: >30 /hpf — AB (ref 0–5)

## 2021-11-30 MED ORDER — NITROFURANTOIN MONOHYD MACRO 100 MG PO CAPS: 100.0000 mg | ORAL_CAPSULE | Freq: Two times a day (BID) | ORAL | 0 refills | Status: AC

## 2021-11-30 NOTE — Progress Notes (Signed)
Urological Symptom Review  Patient is experiencing the following symptoms: Frequent urination Hard to postpone urination Get up at night to urinate Leakage of urine   Review of Systems  Gastrointestinal (upper)  : Indigestion/heartburn  Gastrointestinal (lower) : Negative for lower GI symptoms  Constitutional : Fatigue  Skin: Negative for skin symptoms  Eyes: Negative for eye symptoms  Ear/Nose/Throat : Negative for Ear/Nose/Throat symptoms  Hematologic/Lymphatic: Negative for Hematologic/Lymphatic symptoms  Cardiovascular : Leg swelling  Respiratory : Negative for respiratory symptoms  Endocrine: Negative for endocrine symptoms  Musculoskeletal: Back pain  Neurological: Negative for neurological symptoms  Psychologic: Negative for psychiatric symptoms

## 2021-11-30 NOTE — Progress Notes (Signed)
8.2.2022: presenting here for evaluation and management of recurring urinary tract infections.  She has been admitted to the hospital 5 times for treatment of urinary tract infections since October.  Usually, bacterial profile is the same.   Most recent hospitalization was on June 18, 2021.  She was COVID-positive, had weakness, and was found to have UTI.  The below is a wrap-up of her urologic situation at that time:   Patient has history of frequent UTIs and multidrug resistant organisms with complicated allergy history   Urine culture yielded 50,000 - 100,000 colonies of E. coli (ESBL) and >100,000 colonies of Aerococcuas urinae which she has had on previous cultures. Patient had ESBL last hospital admission. Patient was initiated on Invanz today is day 10 of therapy. Patient's mentation has improved to baseline.   She has seen Dr. Lerry Liner before in the past for urinary tract infections.  She has not seen a urologist recently.  She does have dysuria/pain with urination.  She has urinary frequency and urgency.   I treated her w/ bid doses of methenamine as well as perivaginal estrogen cream.   9.13.2022: She returns today for follow-up with her daughter.  Fortunately, she has not needed to be treated for urinary tract infection since her first visit.  They are using the estrogen cream 3 nights a week, the [place]heney mean is difficult to cut in half.  She is having some lower abdominal discomfort and frequent bowel movements.  She denies dysuria or gross hematuria.  12.13.2022: No documented urinary tract infections since her last visit here 3 months ago.  However, she is off of methenamine due to diarrhea.  She is still on estrogen cream 2-3 nights a week.   Past Medical History:  Diagnosis Date   Arthritis    Chronic diastolic heart failure (HCC)    Diverticulosis of colon (without mention of hemorrhage)    TCS by Dr. Gala Romney   GERD (gastroesophageal reflux disease)    Hemorrhoids, internal     TCS by Dr. Gala Romney   Irritable bowel syndrome    Migraine    Mitral regurgitation    Peripheral polyneuropathy    Confirmed by EMG and nerve conduction velocities    Pulmonary nodule    Seizures ([place]) 2011   Syncope    Neurocardiogenic - positive tilt table test, on Florinef   Tubular adenoma    UTI (urinary tract infection)    Recurrent    Past Surgical History:  Procedure Laterality Date   APPENDECTOMY     Back Pain     going to chiropracator for back and hip pain   BACK SURGERY     x2   bladder tack     CATARACT EXTRACTION, BILATERAL  2017   CHOLECYSTECTOMY     COLONOSCOPY  06/16/2005   internal hemorrhoids, L side diverticula - Dr. Gala Romney   COLONOSCOPY N/A 05/02/2013   Dr. Gala Romney- normal rectum, scattered L sided diverticula. bx= tubular adenoma   ESOPHAGOGASTRODUODENOSCOPY  12/21/2006     Dr. Vivi Ferns esophagus/Patulous EG junction, small to moderate sized hiatal hernia,   multiple fundal gland type appearing polyps biopsied, otherwise normal   FOOT SURGERY     cyst removed   TOTAL ABDOMINAL HYSTERECTOMY     TOTAL HIP ARTHROPLASTY Right 01/11/2017   Procedure: RIGHT TOTAL HIP ARTHROPLASTY ANTERIOR APPROACH;  Surgeon: Gaynelle Arabian, MD;  Location: [facility];  Service: Orthopedics;  Laterality: Right;   WRIST SURGERY      Home Medications:  Allergies  as of 11/30/2021       Reactions   Cephalexin Anaphylaxis, Swelling   Doxazosin Mesylate Other (See Comments)   Made patient EXTREMELY sick.    Iodine Hives   Levofloxacin Anaphylaxis, Rash   Codeine Other (See Comments)   Hallucinations   Indomethacin Other (See Comments)   Severe Headache    Ivp Dye [iodinated Diagnostic Agents] Hives   Morphine Other (See Comments)   Hallucinations    Penicillamine    Other reaction(s): Other (See Comments)   Sulfamethoxazole-trimethoprim Other (See Comments)   Unknown    Trimethoprim    Other reaction(s): Other (See Comments)   Penicillins Other (See Comments)   Made patient  go into shock Has patient had a PCN reaction causing immediate rash, facial/tongue/throat swelling, SOB or lightheadedness with hypotension: Yes Has patient had a PCN reaction causing severe rash involving mucus membranes or skin necrosis: No Has patient had a PCN reaction that required hospitalization No Has patient had a PCN reaction occurring within the last 10 years: No If all of the above answers are "NO", then may proceed with Cephalosporin use.        Medication List        Accurate as of November 30, 2021  8:44 AM. If you have any questions, ask your nurse or doctor.          acetaminophen 650 MG CR tablet Commonly known as: TYLENOL Take 1,300 mg by mouth in the morning and at bedtime.   aspirin EC 81 MG tablet Take 81 mg by mouth daily.   calcitonin (salmon) 200 UNIT/ACT nasal spray Commonly known as: MIACALCIN/FORTICAL Place 2 sprays into alternate nostrils daily.   CENTRUM SILVER 50+WOMEN PO Take 1 tablet by mouth daily.   cetirizine 10 MG tablet Commonly known as: ZYRTEC Take 10 mg by mouth daily.   estradiol 0.1 MG/GM vaginal cream Commonly known as: ESTRACE Place 1 Applicatorful vaginally 3 (three) times a week.   fludrocortisone 0.1 MG tablet Commonly known as: FLORINEF TAKE 1 TABLET THREE TIMES A WEEK   fluticasone 50 MCG/ACT nasal spray Commonly known as: FLONASE 2 sprays into each nostril daily.   furosemide 20 MG tablet Commonly known as: LASIX Take 20 mg by mouth every other day.   levothyroxine 75 MCG tablet Commonly known as: SYNTHROID Take 75 mcg by mouth daily before breakfast.   lisinopril 5 MG tablet Commonly known as: ZESTRIL Take 5 mg by mouth daily.   nitroGLYCERIN 0.4 MG SL tablet Commonly known as: NITROSTAT Place 0.4 mg under the tongue every 5 (five) minutes as needed for chest pain.   potassium chloride SA 20 MEQ tablet Commonly known as: KLOR-CON M Take 10 mEq by mouth 2 (two) times daily.   PROBIOTIC ACIDOPHILUS  PO Take 1 capsule by mouth daily.   RABEprazole 20 MG tablet Commonly known as: ACIPHEX Take 20 mg by mouth daily.   topiramate 50 MG tablet Commonly known as: TOPAMAX Take 50 mg by mouth daily.        Allergies:  Allergies  Allergen Reactions   Cephalexin Anaphylaxis and Swelling   Doxazosin Mesylate Other (See Comments)    Made patient EXTREMELY sick.    Iodine Hives   Levofloxacin Anaphylaxis and Rash   Codeine Other (See Comments)    Hallucinations   Indomethacin Other (See Comments)    Severe Headache    Ivp Dye [Iodinated Diagnostic Agents] Hives   Morphine Other (See Comments)    Hallucinations  Penicillamine     Other reaction(s): Other (See Comments)   Sulfamethoxazole-Trimethoprim Other (See Comments)    Unknown    Trimethoprim     Other reaction(s): Other (See Comments)   Penicillins Other (See Comments)    Made patient go into shock Has patient had a PCN reaction causing immediate rash, facial/tongue/throat swelling, SOB or lightheadedness with hypotension: Yes Has patient had a PCN reaction causing severe rash involving mucus membranes or skin necrosis: No Has patient had a PCN reaction that required hospitalization No Has patient had a PCN reaction occurring within the last 10 years: No If all of the above answers are "NO", then may proceed with Cephalosporin use.     Family History  Problem Relation Age of Onset   Stroke Father    Colon cancer Neg Hx     Social History:  reports that she has never smoked. She has never used smokeless tobacco. She reports that she does not drink alcohol and does not use drugs.  ROS: A complete review of systems was performed.  All systems are negative except for pertinent findings as noted.  Physical Exam:  Vital signs in last 24 hours: There were no vitals taken for this visit. Constitutional:  Alert and oriented, No acute distress Cardiovascular: Regular rate  Respiratory: Normal respiratory effort GI:  Abdomen is soft, nontender, nondistended, no abdominal masses. No CVAT.  Neurologic: Grossly intact, no focal deficits Psychiatric: Normal mood and affect  I have reviewed prior pt notes  I have reviewed notes from referring/previous physicians  I have reviewed urinalysis results  I have reviewed prior urine cultures   Impression/Assessment:  1.  Recurrent urinary tract infections, currently asymptomatic although she does have polyuria  2.  Vaginal atrophic changes, on estrogen cream  Plan:  1.  She will continue the estrogen cream  2.  I would recommend adding vitamin C 500 mg twice a day  3.  Prescription given for Macrobid for breakthrough infections  4.  I will see back in 1 year for recheck unless she has recurring infections before that

## 2021-12-02 DIAGNOSIS — Z7982 Long term (current) use of aspirin: Secondary | ICD-10-CM | POA: Diagnosis not present

## 2021-12-02 DIAGNOSIS — I739 Peripheral vascular disease, unspecified: Secondary | ICD-10-CM | POA: Diagnosis not present

## 2021-12-02 DIAGNOSIS — I110 Hypertensive heart disease with heart failure: Secondary | ICD-10-CM | POA: Diagnosis not present

## 2021-12-02 DIAGNOSIS — I5032 Chronic diastolic (congestive) heart failure: Secondary | ICD-10-CM | POA: Diagnosis not present

## 2021-12-02 DIAGNOSIS — Z9981 Dependence on supplemental oxygen: Secondary | ICD-10-CM | POA: Diagnosis not present

## 2021-12-02 DIAGNOSIS — M19072 Primary osteoarthritis, left ankle and foot: Secondary | ICD-10-CM | POA: Diagnosis not present

## 2021-12-03 LAB — URINE CULTURE

## 2021-12-06 ENCOUNTER — Telehealth: Payer: Self-pay

## 2021-12-06 NOTE — Telephone Encounter (Signed)
-----   Message from Franchot Gallo, MD sent at 12/06/2021 11:16 AM EST ----- Notify pt/family that E coli did grow out. I would not treat w/ abx @ this point though as she is not symptomatic. Continue estrogen cream, Vit C. ----- Message ----- From: Dorisann Frames, RN Sent: 12/03/2021   8:59 AM EST To: Franchot Gallo, MD  Patient was placed on nitrofurantoin on 12/13

## 2021-12-06 NOTE — Telephone Encounter (Signed)
Daughter called and made aware. 

## 2021-12-07 DIAGNOSIS — Z7982 Long term (current) use of aspirin: Secondary | ICD-10-CM | POA: Diagnosis not present

## 2021-12-07 DIAGNOSIS — M069 Rheumatoid arthritis, unspecified: Secondary | ICD-10-CM | POA: Diagnosis not present

## 2021-12-07 DIAGNOSIS — I5032 Chronic diastolic (congestive) heart failure: Secondary | ICD-10-CM | POA: Diagnosis not present

## 2021-12-07 DIAGNOSIS — M79671 Pain in right foot: Secondary | ICD-10-CM | POA: Diagnosis not present

## 2021-12-07 DIAGNOSIS — M19072 Primary osteoarthritis, left ankle and foot: Secondary | ICD-10-CM | POA: Diagnosis not present

## 2021-12-07 DIAGNOSIS — D591 Autoimmune hemolytic anemia, unspecified: Secondary | ICD-10-CM | POA: Diagnosis not present

## 2021-12-07 DIAGNOSIS — D649 Anemia, unspecified: Secondary | ICD-10-CM | POA: Diagnosis not present

## 2021-12-07 DIAGNOSIS — Z9981 Dependence on supplemental oxygen: Secondary | ICD-10-CM | POA: Diagnosis not present

## 2021-12-07 DIAGNOSIS — M79672 Pain in left foot: Secondary | ICD-10-CM | POA: Diagnosis not present

## 2021-12-07 DIAGNOSIS — L110 Acquired keratosis follicularis: Secondary | ICD-10-CM | POA: Diagnosis not present

## 2021-12-07 DIAGNOSIS — I739 Peripheral vascular disease, unspecified: Secondary | ICD-10-CM | POA: Diagnosis not present

## 2021-12-07 DIAGNOSIS — I110 Hypertensive heart disease with heart failure: Secondary | ICD-10-CM | POA: Diagnosis not present

## 2021-12-07 DIAGNOSIS — M25579 Pain in unspecified ankle and joints of unspecified foot: Secondary | ICD-10-CM | POA: Diagnosis not present

## 2021-12-07 DIAGNOSIS — M2042 Other hammer toe(s) (acquired), left foot: Secondary | ICD-10-CM | POA: Diagnosis not present

## 2021-12-07 DIAGNOSIS — M2041 Other hammer toe(s) (acquired), right foot: Secondary | ICD-10-CM | POA: Diagnosis not present

## 2021-12-07 DIAGNOSIS — E876 Hypokalemia: Secondary | ICD-10-CM | POA: Diagnosis not present

## 2021-12-09 DIAGNOSIS — Z79899 Other long term (current) drug therapy: Secondary | ICD-10-CM | POA: Diagnosis not present

## 2021-12-09 DIAGNOSIS — Z7982 Long term (current) use of aspirin: Secondary | ICD-10-CM | POA: Diagnosis not present

## 2021-12-09 DIAGNOSIS — I739 Peripheral vascular disease, unspecified: Secondary | ICD-10-CM | POA: Diagnosis not present

## 2021-12-09 DIAGNOSIS — Z8616 Personal history of COVID-19: Secondary | ICD-10-CM | POA: Diagnosis not present

## 2021-12-09 DIAGNOSIS — Z7952 Long term (current) use of systemic steroids: Secondary | ICD-10-CM | POA: Diagnosis not present

## 2021-12-09 DIAGNOSIS — I110 Hypertensive heart disease with heart failure: Secondary | ICD-10-CM | POA: Diagnosis not present

## 2021-12-09 DIAGNOSIS — I5032 Chronic diastolic (congestive) heart failure: Secondary | ICD-10-CM | POA: Diagnosis not present

## 2021-12-09 DIAGNOSIS — M19072 Primary osteoarthritis, left ankle and foot: Secondary | ICD-10-CM | POA: Diagnosis not present

## 2021-12-09 DIAGNOSIS — Z9181 History of falling: Secondary | ICD-10-CM | POA: Diagnosis not present

## 2021-12-09 DIAGNOSIS — Z9981 Dependence on supplemental oxygen: Secondary | ICD-10-CM | POA: Diagnosis not present

## 2021-12-16 DIAGNOSIS — M19072 Primary osteoarthritis, left ankle and foot: Secondary | ICD-10-CM | POA: Diagnosis not present

## 2021-12-16 DIAGNOSIS — I110 Hypertensive heart disease with heart failure: Secondary | ICD-10-CM | POA: Diagnosis not present

## 2021-12-16 DIAGNOSIS — I739 Peripheral vascular disease, unspecified: Secondary | ICD-10-CM | POA: Diagnosis not present

## 2021-12-16 DIAGNOSIS — Z7982 Long term (current) use of aspirin: Secondary | ICD-10-CM | POA: Diagnosis not present

## 2021-12-16 DIAGNOSIS — I5032 Chronic diastolic (congestive) heart failure: Secondary | ICD-10-CM | POA: Diagnosis not present

## 2021-12-16 DIAGNOSIS — Z9981 Dependence on supplemental oxygen: Secondary | ICD-10-CM | POA: Diagnosis not present

## 2021-12-22 DIAGNOSIS — R928 Other abnormal and inconclusive findings on diagnostic imaging of breast: Secondary | ICD-10-CM | POA: Diagnosis not present

## 2021-12-22 DIAGNOSIS — N6320 Unspecified lump in the left breast, unspecified quadrant: Secondary | ICD-10-CM | POA: Diagnosis not present

## 2021-12-22 DIAGNOSIS — N644 Mastodynia: Secondary | ICD-10-CM | POA: Diagnosis not present

## 2021-12-22 DIAGNOSIS — R922 Inconclusive mammogram: Secondary | ICD-10-CM | POA: Diagnosis not present

## 2021-12-22 DIAGNOSIS — Z66 Do not resuscitate: Secondary | ICD-10-CM | POA: Diagnosis not present

## 2021-12-24 DIAGNOSIS — Z9981 Dependence on supplemental oxygen: Secondary | ICD-10-CM | POA: Diagnosis not present

## 2021-12-24 DIAGNOSIS — I5032 Chronic diastolic (congestive) heart failure: Secondary | ICD-10-CM | POA: Diagnosis not present

## 2021-12-24 DIAGNOSIS — I110 Hypertensive heart disease with heart failure: Secondary | ICD-10-CM | POA: Diagnosis not present

## 2021-12-24 DIAGNOSIS — I739 Peripheral vascular disease, unspecified: Secondary | ICD-10-CM | POA: Diagnosis not present

## 2021-12-24 DIAGNOSIS — Z7982 Long term (current) use of aspirin: Secondary | ICD-10-CM | POA: Diagnosis not present

## 2021-12-24 DIAGNOSIS — M19072 Primary osteoarthritis, left ankle and foot: Secondary | ICD-10-CM | POA: Diagnosis not present

## 2022-01-05 DIAGNOSIS — R300 Dysuria: Secondary | ICD-10-CM | POA: Diagnosis not present

## 2022-01-06 DIAGNOSIS — I5032 Chronic diastolic (congestive) heart failure: Secondary | ICD-10-CM | POA: Diagnosis not present

## 2022-01-06 DIAGNOSIS — Z7982 Long term (current) use of aspirin: Secondary | ICD-10-CM | POA: Diagnosis not present

## 2022-01-06 DIAGNOSIS — I739 Peripheral vascular disease, unspecified: Secondary | ICD-10-CM | POA: Diagnosis not present

## 2022-01-06 DIAGNOSIS — I110 Hypertensive heart disease with heart failure: Secondary | ICD-10-CM | POA: Diagnosis not present

## 2022-01-06 DIAGNOSIS — M19072 Primary osteoarthritis, left ankle and foot: Secondary | ICD-10-CM | POA: Diagnosis not present

## 2022-01-06 DIAGNOSIS — Z9981 Dependence on supplemental oxygen: Secondary | ICD-10-CM | POA: Diagnosis not present

## 2022-01-07 DIAGNOSIS — Z9981 Dependence on supplemental oxygen: Secondary | ICD-10-CM | POA: Diagnosis not present

## 2022-01-07 DIAGNOSIS — Z7982 Long term (current) use of aspirin: Secondary | ICD-10-CM | POA: Diagnosis not present

## 2022-01-07 DIAGNOSIS — M19072 Primary osteoarthritis, left ankle and foot: Secondary | ICD-10-CM | POA: Diagnosis not present

## 2022-01-07 DIAGNOSIS — I739 Peripheral vascular disease, unspecified: Secondary | ICD-10-CM | POA: Diagnosis not present

## 2022-01-07 DIAGNOSIS — I110 Hypertensive heart disease with heart failure: Secondary | ICD-10-CM | POA: Diagnosis not present

## 2022-01-07 DIAGNOSIS — Z7952 Long term (current) use of systemic steroids: Secondary | ICD-10-CM | POA: Diagnosis not present

## 2022-01-07 DIAGNOSIS — I5032 Chronic diastolic (congestive) heart failure: Secondary | ICD-10-CM | POA: Diagnosis not present

## 2022-01-07 DIAGNOSIS — Z79899 Other long term (current) drug therapy: Secondary | ICD-10-CM | POA: Diagnosis not present

## 2022-01-11 DIAGNOSIS — D61818 Other pancytopenia: Secondary | ICD-10-CM | POA: Diagnosis not present

## 2022-01-11 DIAGNOSIS — D649 Anemia, unspecified: Secondary | ICD-10-CM | POA: Diagnosis not present

## 2022-01-11 DIAGNOSIS — R531 Weakness: Secondary | ICD-10-CM | POA: Diagnosis not present

## 2022-01-11 DIAGNOSIS — R1310 Dysphagia, unspecified: Secondary | ICD-10-CM | POA: Diagnosis not present

## 2022-01-11 DIAGNOSIS — R0902 Hypoxemia: Secondary | ICD-10-CM | POA: Diagnosis not present

## 2022-01-11 DIAGNOSIS — Z79899 Other long term (current) drug therapy: Secondary | ICD-10-CM | POA: Diagnosis not present

## 2022-01-11 DIAGNOSIS — R2689 Other abnormalities of gait and mobility: Secondary | ICD-10-CM | POA: Diagnosis not present

## 2022-01-11 DIAGNOSIS — M6281 Muscle weakness (generalized): Secondary | ICD-10-CM | POA: Diagnosis not present

## 2022-01-11 DIAGNOSIS — I272 Pulmonary hypertension, unspecified: Secondary | ICD-10-CM | POA: Diagnosis not present

## 2022-01-11 DIAGNOSIS — D591 Autoimmune hemolytic anemia, unspecified: Secondary | ICD-10-CM | POA: Diagnosis not present

## 2022-01-11 DIAGNOSIS — N179 Acute kidney failure, unspecified: Secondary | ICD-10-CM | POA: Diagnosis not present

## 2022-01-11 DIAGNOSIS — Z91041 Radiographic dye allergy status: Secondary | ICD-10-CM | POA: Diagnosis not present

## 2022-01-11 DIAGNOSIS — I5032 Chronic diastolic (congestive) heart failure: Secondary | ICD-10-CM | POA: Diagnosis not present

## 2022-01-11 DIAGNOSIS — Z7982 Long term (current) use of aspirin: Secondary | ICD-10-CM | POA: Diagnosis not present

## 2022-01-11 DIAGNOSIS — D696 Thrombocytopenia, unspecified: Secondary | ICD-10-CM | POA: Diagnosis not present

## 2022-01-11 DIAGNOSIS — Z8616 Personal history of COVID-19: Secondary | ICD-10-CM | POA: Diagnosis not present

## 2022-01-11 DIAGNOSIS — Z885 Allergy status to narcotic agent status: Secondary | ICD-10-CM | POA: Diagnosis not present

## 2022-01-11 DIAGNOSIS — N3001 Acute cystitis with hematuria: Secondary | ICD-10-CM | POA: Diagnosis not present

## 2022-01-11 DIAGNOSIS — E039 Hypothyroidism, unspecified: Secondary | ICD-10-CM | POA: Diagnosis not present

## 2022-01-11 DIAGNOSIS — R0600 Dyspnea, unspecified: Secondary | ICD-10-CM | POA: Diagnosis not present

## 2022-01-11 DIAGNOSIS — Z1621 Resistance to vancomycin: Secondary | ICD-10-CM | POA: Diagnosis not present

## 2022-01-11 DIAGNOSIS — Z20822 Contact with and (suspected) exposure to covid-19: Secondary | ICD-10-CM | POA: Diagnosis not present

## 2022-01-11 DIAGNOSIS — Z9981 Dependence on supplemental oxygen: Secondary | ICD-10-CM | POA: Diagnosis not present

## 2022-01-11 DIAGNOSIS — Z880 Allergy status to penicillin: Secondary | ICD-10-CM | POA: Diagnosis not present

## 2022-01-11 DIAGNOSIS — I951 Orthostatic hypotension: Secondary | ICD-10-CM | POA: Diagnosis not present

## 2022-01-11 DIAGNOSIS — I361 Nonrheumatic tricuspid (valve) insufficiency: Secondary | ICD-10-CM | POA: Diagnosis not present

## 2022-01-11 DIAGNOSIS — Z79890 Hormone replacement therapy (postmenopausal): Secondary | ICD-10-CM | POA: Diagnosis not present

## 2022-01-11 DIAGNOSIS — D72819 Decreased white blood cell count, unspecified: Secondary | ICD-10-CM | POA: Diagnosis not present

## 2022-01-11 DIAGNOSIS — I110 Hypertensive heart disease with heart failure: Secondary | ICD-10-CM | POA: Diagnosis not present

## 2022-01-11 DIAGNOSIS — Z87440 Personal history of urinary (tract) infections: Secondary | ICD-10-CM | POA: Diagnosis not present

## 2022-01-11 DIAGNOSIS — N390 Urinary tract infection, site not specified: Secondary | ICD-10-CM | POA: Diagnosis not present

## 2022-01-11 DIAGNOSIS — I10 Essential (primary) hypertension: Secondary | ICD-10-CM | POA: Diagnosis not present

## 2022-01-11 DIAGNOSIS — R41841 Cognitive communication deficit: Secondary | ICD-10-CM | POA: Diagnosis not present

## 2022-01-11 DIAGNOSIS — I959 Hypotension, unspecified: Secondary | ICD-10-CM | POA: Diagnosis not present

## 2022-01-11 DIAGNOSIS — Z7401 Bed confinement status: Secondary | ICD-10-CM | POA: Diagnosis not present

## 2022-01-11 DIAGNOSIS — D539 Nutritional anemia, unspecified: Secondary | ICD-10-CM | POA: Diagnosis not present

## 2022-01-11 DIAGNOSIS — K5792 Diverticulitis of intestine, part unspecified, without perforation or abscess without bleeding: Secondary | ICD-10-CM | POA: Diagnosis not present

## 2022-01-11 DIAGNOSIS — G629 Polyneuropathy, unspecified: Secondary | ICD-10-CM | POA: Diagnosis not present

## 2022-01-18 DIAGNOSIS — Z9049 Acquired absence of other specified parts of digestive tract: Secondary | ICD-10-CM | POA: Diagnosis not present

## 2022-01-18 DIAGNOSIS — R52 Pain, unspecified: Secondary | ICD-10-CM | POA: Diagnosis not present

## 2022-01-18 DIAGNOSIS — N179 Acute kidney failure, unspecified: Secondary | ICD-10-CM | POA: Diagnosis not present

## 2022-01-18 DIAGNOSIS — Z7952 Long term (current) use of systemic steroids: Secondary | ICD-10-CM | POA: Diagnosis not present

## 2022-01-18 DIAGNOSIS — N289 Disorder of kidney and ureter, unspecified: Secondary | ICD-10-CM | POA: Diagnosis not present

## 2022-01-18 DIAGNOSIS — Z1621 Resistance to vancomycin: Secondary | ICD-10-CM | POA: Diagnosis not present

## 2022-01-18 DIAGNOSIS — Z881 Allergy status to other antibiotic agents status: Secondary | ICD-10-CM | POA: Diagnosis not present

## 2022-01-18 DIAGNOSIS — Z96641 Presence of right artificial hip joint: Secondary | ICD-10-CM | POA: Diagnosis not present

## 2022-01-18 DIAGNOSIS — E860 Dehydration: Secondary | ICD-10-CM | POA: Diagnosis not present

## 2022-01-18 DIAGNOSIS — R0902 Hypoxemia: Secondary | ICD-10-CM | POA: Diagnosis not present

## 2022-01-18 DIAGNOSIS — R1310 Dysphagia, unspecified: Secondary | ICD-10-CM | POA: Diagnosis not present

## 2022-01-18 DIAGNOSIS — Z20822 Contact with and (suspected) exposure to covid-19: Secondary | ICD-10-CM | POA: Diagnosis not present

## 2022-01-18 DIAGNOSIS — I951 Orthostatic hypotension: Secondary | ICD-10-CM | POA: Diagnosis not present

## 2022-01-18 DIAGNOSIS — I700 Atherosclerosis of aorta: Secondary | ICD-10-CM | POA: Diagnosis not present

## 2022-01-18 DIAGNOSIS — I5032 Chronic diastolic (congestive) heart failure: Secondary | ICD-10-CM | POA: Diagnosis not present

## 2022-01-18 DIAGNOSIS — M069 Rheumatoid arthritis, unspecified: Secondary | ICD-10-CM | POA: Diagnosis not present

## 2022-01-18 DIAGNOSIS — R634 Abnormal weight loss: Secondary | ICD-10-CM | POA: Diagnosis not present

## 2022-01-18 DIAGNOSIS — K5792 Diverticulitis of intestine, part unspecified, without perforation or abscess without bleeding: Secondary | ICD-10-CM | POA: Diagnosis not present

## 2022-01-18 DIAGNOSIS — R0602 Shortness of breath: Secondary | ICD-10-CM | POA: Diagnosis not present

## 2022-01-18 DIAGNOSIS — Z7401 Bed confinement status: Secondary | ICD-10-CM | POA: Diagnosis not present

## 2022-01-18 DIAGNOSIS — E039 Hypothyroidism, unspecified: Secondary | ICD-10-CM | POA: Diagnosis not present

## 2022-01-18 DIAGNOSIS — K5732 Diverticulitis of large intestine without perforation or abscess without bleeding: Secondary | ICD-10-CM | POA: Diagnosis not present

## 2022-01-18 DIAGNOSIS — Z90710 Acquired absence of both cervix and uterus: Secondary | ICD-10-CM | POA: Diagnosis not present

## 2022-01-18 DIAGNOSIS — Z9889 Other specified postprocedural states: Secondary | ICD-10-CM | POA: Diagnosis not present

## 2022-01-18 DIAGNOSIS — Z87440 Personal history of urinary (tract) infections: Secondary | ICD-10-CM | POA: Diagnosis not present

## 2022-01-18 DIAGNOSIS — Z880 Allergy status to penicillin: Secondary | ICD-10-CM | POA: Diagnosis not present

## 2022-01-18 DIAGNOSIS — R197 Diarrhea, unspecified: Secondary | ICD-10-CM | POA: Diagnosis not present

## 2022-01-18 DIAGNOSIS — R531 Weakness: Secondary | ICD-10-CM | POA: Diagnosis not present

## 2022-01-18 DIAGNOSIS — K449 Diaphragmatic hernia without obstruction or gangrene: Secondary | ICD-10-CM | POA: Diagnosis not present

## 2022-01-18 DIAGNOSIS — B952 Enterococcus as the cause of diseases classified elsewhere: Secondary | ICD-10-CM | POA: Diagnosis not present

## 2022-01-18 DIAGNOSIS — D591 Autoimmune hemolytic anemia, unspecified: Secondary | ICD-10-CM | POA: Diagnosis not present

## 2022-01-18 DIAGNOSIS — K5730 Diverticulosis of large intestine without perforation or abscess without bleeding: Secondary | ICD-10-CM | POA: Diagnosis not present

## 2022-01-18 DIAGNOSIS — R161 Splenomegaly, not elsewhere classified: Secondary | ICD-10-CM | POA: Diagnosis not present

## 2022-01-18 DIAGNOSIS — Z9981 Dependence on supplemental oxygen: Secondary | ICD-10-CM | POA: Diagnosis not present

## 2022-01-18 DIAGNOSIS — I959 Hypotension, unspecified: Secondary | ICD-10-CM | POA: Diagnosis not present

## 2022-01-18 DIAGNOSIS — M6281 Muscle weakness (generalized): Secondary | ICD-10-CM | POA: Diagnosis not present

## 2022-01-18 DIAGNOSIS — G629 Polyneuropathy, unspecified: Secondary | ICD-10-CM | POA: Diagnosis not present

## 2022-01-18 DIAGNOSIS — R2689 Other abnormalities of gait and mobility: Secondary | ICD-10-CM | POA: Diagnosis not present

## 2022-01-18 DIAGNOSIS — N3001 Acute cystitis with hematuria: Secondary | ICD-10-CM | POA: Diagnosis not present

## 2022-01-18 DIAGNOSIS — Z8739 Personal history of other diseases of the musculoskeletal system and connective tissue: Secondary | ICD-10-CM | POA: Diagnosis not present

## 2022-01-18 DIAGNOSIS — R1084 Generalized abdominal pain: Secondary | ICD-10-CM | POA: Diagnosis not present

## 2022-01-18 DIAGNOSIS — I110 Hypertensive heart disease with heart failure: Secondary | ICD-10-CM | POA: Diagnosis not present

## 2022-01-18 DIAGNOSIS — R109 Unspecified abdominal pain: Secondary | ICD-10-CM | POA: Diagnosis not present

## 2022-01-18 DIAGNOSIS — Z743 Need for continuous supervision: Secondary | ICD-10-CM | POA: Diagnosis not present

## 2022-01-18 DIAGNOSIS — Z66 Do not resuscitate: Secondary | ICD-10-CM | POA: Diagnosis not present

## 2022-01-18 DIAGNOSIS — Z882 Allergy status to sulfonamides status: Secondary | ICD-10-CM | POA: Diagnosis not present

## 2022-01-18 DIAGNOSIS — Z862 Personal history of diseases of the blood and blood-forming organs and certain disorders involving the immune mechanism: Secondary | ICD-10-CM | POA: Diagnosis not present

## 2022-01-18 DIAGNOSIS — Z885 Allergy status to narcotic agent status: Secondary | ICD-10-CM | POA: Diagnosis not present

## 2022-01-18 DIAGNOSIS — D649 Anemia, unspecified: Secondary | ICD-10-CM | POA: Diagnosis not present

## 2022-01-18 DIAGNOSIS — I10 Essential (primary) hypertension: Secondary | ICD-10-CM | POA: Diagnosis not present

## 2022-01-18 DIAGNOSIS — D61818 Other pancytopenia: Secondary | ICD-10-CM | POA: Diagnosis not present

## 2022-01-18 DIAGNOSIS — N200 Calculus of kidney: Secondary | ICD-10-CM | POA: Diagnosis not present

## 2022-01-18 DIAGNOSIS — Z91041 Radiographic dye allergy status: Secondary | ICD-10-CM | POA: Diagnosis not present

## 2022-01-18 DIAGNOSIS — N390 Urinary tract infection, site not specified: Secondary | ICD-10-CM | POA: Diagnosis not present

## 2022-01-18 DIAGNOSIS — R41841 Cognitive communication deficit: Secondary | ICD-10-CM | POA: Diagnosis not present

## 2022-01-18 DIAGNOSIS — Z79899 Other long term (current) drug therapy: Secondary | ICD-10-CM | POA: Diagnosis not present

## 2022-01-19 DIAGNOSIS — R531 Weakness: Secondary | ICD-10-CM | POA: Diagnosis not present

## 2022-01-19 DIAGNOSIS — D649 Anemia, unspecified: Secondary | ICD-10-CM | POA: Diagnosis not present

## 2022-02-16 DIAGNOSIS — Z79899 Other long term (current) drug therapy: Secondary | ICD-10-CM | POA: Diagnosis not present

## 2022-02-16 DIAGNOSIS — Z881 Allergy status to other antibiotic agents status: Secondary | ICD-10-CM | POA: Diagnosis not present

## 2022-02-16 DIAGNOSIS — E860 Dehydration: Secondary | ICD-10-CM | POA: Diagnosis not present

## 2022-02-16 DIAGNOSIS — N289 Disorder of kidney and ureter, unspecified: Secondary | ICD-10-CM | POA: Diagnosis not present

## 2022-02-16 DIAGNOSIS — Z885 Allergy status to narcotic agent status: Secondary | ICD-10-CM | POA: Diagnosis not present

## 2022-02-16 DIAGNOSIS — Z880 Allergy status to penicillin: Secondary | ICD-10-CM | POA: Diagnosis not present

## 2022-02-16 DIAGNOSIS — Z882 Allergy status to sulfonamides status: Secondary | ICD-10-CM | POA: Diagnosis not present

## 2022-02-16 DIAGNOSIS — Z91041 Radiographic dye allergy status: Secondary | ICD-10-CM | POA: Diagnosis not present

## 2022-02-16 DIAGNOSIS — R109 Unspecified abdominal pain: Secondary | ICD-10-CM | POA: Diagnosis not present

## 2022-02-16 DIAGNOSIS — R531 Weakness: Secondary | ICD-10-CM | POA: Diagnosis not present

## 2022-02-16 DIAGNOSIS — R0602 Shortness of breath: Secondary | ICD-10-CM | POA: Diagnosis not present

## 2022-02-16 DIAGNOSIS — I959 Hypotension, unspecified: Secondary | ICD-10-CM | POA: Diagnosis not present

## 2022-02-16 DIAGNOSIS — K449 Diaphragmatic hernia without obstruction or gangrene: Secondary | ICD-10-CM | POA: Diagnosis not present

## 2022-02-16 DIAGNOSIS — I700 Atherosclerosis of aorta: Secondary | ICD-10-CM | POA: Diagnosis not present

## 2022-02-16 DIAGNOSIS — D649 Anemia, unspecified: Secondary | ICD-10-CM | POA: Diagnosis not present

## 2022-02-20 DIAGNOSIS — Z792 Long term (current) use of antibiotics: Secondary | ICD-10-CM | POA: Diagnosis not present

## 2022-02-20 DIAGNOSIS — R531 Weakness: Secondary | ICD-10-CM | POA: Diagnosis not present

## 2022-02-20 DIAGNOSIS — R161 Splenomegaly, not elsewhere classified: Secondary | ICD-10-CM | POA: Diagnosis not present

## 2022-02-20 DIAGNOSIS — R590 Localized enlarged lymph nodes: Secondary | ICD-10-CM | POA: Diagnosis not present

## 2022-02-20 DIAGNOSIS — K5750 Diverticulosis of both small and large intestine without perforation or abscess without bleeding: Secondary | ICD-10-CM | POA: Diagnosis not present

## 2022-02-20 DIAGNOSIS — Z90710 Acquired absence of both cervix and uterus: Secondary | ICD-10-CM | POA: Diagnosis not present

## 2022-02-20 DIAGNOSIS — Z87440 Personal history of urinary (tract) infections: Secondary | ICD-10-CM | POA: Diagnosis not present

## 2022-02-20 DIAGNOSIS — R1310 Dysphagia, unspecified: Secondary | ICD-10-CM | POA: Diagnosis not present

## 2022-02-20 DIAGNOSIS — Z882 Allergy status to sulfonamides status: Secondary | ICD-10-CM | POA: Diagnosis not present

## 2022-02-20 DIAGNOSIS — N200 Calculus of kidney: Secondary | ICD-10-CM | POA: Diagnosis not present

## 2022-02-20 DIAGNOSIS — Z96641 Presence of right artificial hip joint: Secondary | ICD-10-CM | POA: Diagnosis not present

## 2022-02-20 DIAGNOSIS — D61818 Other pancytopenia: Secondary | ICD-10-CM | POA: Diagnosis not present

## 2022-02-20 DIAGNOSIS — R1084 Generalized abdominal pain: Secondary | ICD-10-CM | POA: Diagnosis not present

## 2022-02-20 DIAGNOSIS — Z8679 Personal history of other diseases of the circulatory system: Secondary | ICD-10-CM | POA: Diagnosis not present

## 2022-02-20 DIAGNOSIS — N3001 Acute cystitis with hematuria: Secondary | ICD-10-CM | POA: Diagnosis not present

## 2022-02-20 DIAGNOSIS — I700 Atherosclerosis of aorta: Secondary | ICD-10-CM | POA: Diagnosis not present

## 2022-02-20 DIAGNOSIS — Z20822 Contact with and (suspected) exposure to covid-19: Secondary | ICD-10-CM | POA: Diagnosis not present

## 2022-02-20 DIAGNOSIS — R41841 Cognitive communication deficit: Secondary | ICD-10-CM | POA: Diagnosis not present

## 2022-02-20 DIAGNOSIS — N390 Urinary tract infection, site not specified: Secondary | ICD-10-CM | POA: Diagnosis not present

## 2022-02-20 DIAGNOSIS — A0472 Enterocolitis due to Clostridium difficile, not specified as recurrent: Secondary | ICD-10-CM | POA: Diagnosis not present

## 2022-02-20 DIAGNOSIS — R197 Diarrhea, unspecified: Secondary | ICD-10-CM | POA: Diagnosis not present

## 2022-02-20 DIAGNOSIS — K5732 Diverticulitis of large intestine without perforation or abscess without bleeding: Secondary | ICD-10-CM | POA: Diagnosis not present

## 2022-02-20 DIAGNOSIS — I5032 Chronic diastolic (congestive) heart failure: Secondary | ICD-10-CM | POA: Diagnosis not present

## 2022-02-20 DIAGNOSIS — Z880 Allergy status to penicillin: Secondary | ICD-10-CM | POA: Diagnosis not present

## 2022-02-20 DIAGNOSIS — Z8739 Personal history of other diseases of the musculoskeletal system and connective tissue: Secondary | ICD-10-CM | POA: Diagnosis not present

## 2022-02-20 DIAGNOSIS — R52 Pain, unspecified: Secondary | ICD-10-CM | POA: Diagnosis not present

## 2022-02-20 DIAGNOSIS — Z862 Personal history of diseases of the blood and blood-forming organs and certain disorders involving the immune mechanism: Secondary | ICD-10-CM | POA: Diagnosis not present

## 2022-02-20 DIAGNOSIS — I959 Hypotension, unspecified: Secondary | ICD-10-CM | POA: Diagnosis not present

## 2022-02-20 DIAGNOSIS — Z79899 Other long term (current) drug therapy: Secondary | ICD-10-CM | POA: Diagnosis not present

## 2022-02-20 DIAGNOSIS — Z91041 Radiographic dye allergy status: Secondary | ICD-10-CM | POA: Diagnosis not present

## 2022-02-20 DIAGNOSIS — M069 Rheumatoid arthritis, unspecified: Secondary | ICD-10-CM | POA: Diagnosis not present

## 2022-02-20 DIAGNOSIS — B952 Enterococcus as the cause of diseases classified elsewhere: Secondary | ICD-10-CM | POA: Diagnosis not present

## 2022-02-20 DIAGNOSIS — Z66 Do not resuscitate: Secondary | ICD-10-CM | POA: Diagnosis not present

## 2022-02-20 DIAGNOSIS — D649 Anemia, unspecified: Secondary | ICD-10-CM | POA: Diagnosis not present

## 2022-02-20 DIAGNOSIS — Z9889 Other specified postprocedural states: Secondary | ICD-10-CM | POA: Diagnosis not present

## 2022-02-20 DIAGNOSIS — E039 Hypothyroidism, unspecified: Secondary | ICD-10-CM | POA: Diagnosis not present

## 2022-02-20 DIAGNOSIS — Z7952 Long term (current) use of systemic steroids: Secondary | ICD-10-CM | POA: Diagnosis not present

## 2022-02-20 DIAGNOSIS — Z1621 Resistance to vancomycin: Secondary | ICD-10-CM | POA: Diagnosis not present

## 2022-02-20 DIAGNOSIS — Z885 Allergy status to narcotic agent status: Secondary | ICD-10-CM | POA: Diagnosis not present

## 2022-02-20 DIAGNOSIS — R2689 Other abnormalities of gait and mobility: Secondary | ICD-10-CM | POA: Diagnosis not present

## 2022-02-20 DIAGNOSIS — D591 Autoimmune hemolytic anemia, unspecified: Secondary | ICD-10-CM | POA: Diagnosis not present

## 2022-02-20 DIAGNOSIS — Z9049 Acquired absence of other specified parts of digestive tract: Secondary | ICD-10-CM | POA: Diagnosis not present

## 2022-02-20 DIAGNOSIS — K5792 Diverticulitis of intestine, part unspecified, without perforation or abscess without bleeding: Secondary | ICD-10-CM | POA: Diagnosis not present

## 2022-02-20 DIAGNOSIS — D72819 Decreased white blood cell count, unspecified: Secondary | ICD-10-CM | POA: Diagnosis not present

## 2022-02-20 DIAGNOSIS — K5730 Diverticulosis of large intestine without perforation or abscess without bleeding: Secondary | ICD-10-CM | POA: Diagnosis not present

## 2022-02-20 DIAGNOSIS — Z8619 Personal history of other infectious and parasitic diseases: Secondary | ICD-10-CM | POA: Diagnosis not present

## 2022-02-20 DIAGNOSIS — Z881 Allergy status to other antibiotic agents status: Secondary | ICD-10-CM | POA: Diagnosis not present

## 2022-02-20 DIAGNOSIS — M6281 Muscle weakness (generalized): Secondary | ICD-10-CM | POA: Diagnosis not present

## 2022-02-20 DIAGNOSIS — R634 Abnormal weight loss: Secondary | ICD-10-CM | POA: Diagnosis not present

## 2022-02-25 DIAGNOSIS — Z66 Do not resuscitate: Secondary | ICD-10-CM | POA: Diagnosis not present

## 2022-02-25 DIAGNOSIS — K5732 Diverticulitis of large intestine without perforation or abscess without bleeding: Secondary | ICD-10-CM | POA: Diagnosis not present

## 2022-02-25 DIAGNOSIS — R1310 Dysphagia, unspecified: Secondary | ICD-10-CM | POA: Diagnosis not present

## 2022-02-25 DIAGNOSIS — N3001 Acute cystitis with hematuria: Secondary | ICD-10-CM | POA: Diagnosis not present

## 2022-02-25 DIAGNOSIS — R197 Diarrhea, unspecified: Secondary | ICD-10-CM | POA: Diagnosis not present

## 2022-02-25 DIAGNOSIS — I110 Hypertensive heart disease with heart failure: Secondary | ICD-10-CM | POA: Diagnosis not present

## 2022-02-25 DIAGNOSIS — N179 Acute kidney failure, unspecified: Secondary | ICD-10-CM | POA: Diagnosis not present

## 2022-02-25 DIAGNOSIS — E039 Hypothyroidism, unspecified: Secondary | ICD-10-CM | POA: Diagnosis not present

## 2022-02-25 DIAGNOSIS — B9620 Unspecified Escherichia coli [E. coli] as the cause of diseases classified elsewhere: Secondary | ICD-10-CM | POA: Diagnosis not present

## 2022-02-25 DIAGNOSIS — I5030 Unspecified diastolic (congestive) heart failure: Secondary | ICD-10-CM | POA: Diagnosis not present

## 2022-02-25 DIAGNOSIS — R531 Weakness: Secondary | ICD-10-CM | POA: Diagnosis not present

## 2022-02-25 DIAGNOSIS — E860 Dehydration: Secondary | ICD-10-CM | POA: Diagnosis not present

## 2022-02-25 DIAGNOSIS — R262 Difficulty in walking, not elsewhere classified: Secondary | ICD-10-CM | POA: Diagnosis not present

## 2022-02-25 DIAGNOSIS — G4489 Other headache syndrome: Secondary | ICD-10-CM | POA: Diagnosis not present

## 2022-02-25 DIAGNOSIS — M6281 Muscle weakness (generalized): Secondary | ICD-10-CM | POA: Diagnosis not present

## 2022-02-25 DIAGNOSIS — K5730 Diverticulosis of large intestine without perforation or abscess without bleeding: Secondary | ICD-10-CM | POA: Diagnosis not present

## 2022-02-25 DIAGNOSIS — Z8719 Personal history of other diseases of the digestive system: Secondary | ICD-10-CM | POA: Diagnosis not present

## 2022-02-25 DIAGNOSIS — N200 Calculus of kidney: Secondary | ICD-10-CM | POA: Diagnosis not present

## 2022-02-25 DIAGNOSIS — D649 Anemia, unspecified: Secondary | ICD-10-CM | POA: Diagnosis not present

## 2022-02-25 DIAGNOSIS — M069 Rheumatoid arthritis, unspecified: Secondary | ICD-10-CM | POA: Diagnosis not present

## 2022-02-25 DIAGNOSIS — G4089 Other seizures: Secondary | ICD-10-CM | POA: Diagnosis not present

## 2022-02-25 DIAGNOSIS — C8590 Non-Hodgkin lymphoma, unspecified, unspecified site: Secondary | ICD-10-CM | POA: Diagnosis not present

## 2022-02-25 DIAGNOSIS — R0789 Other chest pain: Secondary | ICD-10-CM | POA: Diagnosis not present

## 2022-02-25 DIAGNOSIS — R079 Chest pain, unspecified: Secondary | ICD-10-CM | POA: Diagnosis not present

## 2022-02-25 DIAGNOSIS — K219 Gastro-esophageal reflux disease without esophagitis: Secondary | ICD-10-CM | POA: Diagnosis not present

## 2022-02-25 DIAGNOSIS — K5792 Diverticulitis of intestine, part unspecified, without perforation or abscess without bleeding: Secondary | ICD-10-CM | POA: Diagnosis not present

## 2022-02-25 DIAGNOSIS — I959 Hypotension, unspecified: Secondary | ICD-10-CM | POA: Diagnosis not present

## 2022-02-25 DIAGNOSIS — G629 Polyneuropathy, unspecified: Secondary | ICD-10-CM | POA: Diagnosis not present

## 2022-02-25 DIAGNOSIS — R8271 Bacteriuria: Secondary | ICD-10-CM | POA: Diagnosis not present

## 2022-02-25 DIAGNOSIS — R2689 Other abnormalities of gait and mobility: Secondary | ICD-10-CM | POA: Diagnosis not present

## 2022-02-25 DIAGNOSIS — K429 Umbilical hernia without obstruction or gangrene: Secondary | ICD-10-CM | POA: Diagnosis not present

## 2022-02-25 DIAGNOSIS — Z87440 Personal history of urinary (tract) infections: Secondary | ICD-10-CM | POA: Diagnosis not present

## 2022-02-25 DIAGNOSIS — K5750 Diverticulosis of both small and large intestine without perforation or abscess without bleeding: Secondary | ICD-10-CM | POA: Diagnosis not present

## 2022-02-25 DIAGNOSIS — B952 Enterococcus as the cause of diseases classified elsewhere: Secondary | ICD-10-CM | POA: Diagnosis not present

## 2022-02-25 DIAGNOSIS — R1084 Generalized abdominal pain: Secondary | ICD-10-CM | POA: Diagnosis not present

## 2022-02-25 DIAGNOSIS — R4189 Other symptoms and signs involving cognitive functions and awareness: Secondary | ICD-10-CM | POA: Diagnosis not present

## 2022-02-25 DIAGNOSIS — K5790 Diverticulosis of intestine, part unspecified, without perforation or abscess without bleeding: Secondary | ICD-10-CM | POA: Diagnosis not present

## 2022-02-25 DIAGNOSIS — R41840 Attention and concentration deficit: Secondary | ICD-10-CM | POA: Diagnosis not present

## 2022-02-25 DIAGNOSIS — I700 Atherosclerosis of aorta: Secondary | ICD-10-CM | POA: Diagnosis not present

## 2022-02-25 DIAGNOSIS — R161 Splenomegaly, not elsewhere classified: Secondary | ICD-10-CM | POA: Diagnosis not present

## 2022-02-25 DIAGNOSIS — R634 Abnormal weight loss: Secondary | ICD-10-CM | POA: Diagnosis not present

## 2022-02-25 DIAGNOSIS — R5383 Other fatigue: Secondary | ICD-10-CM | POA: Diagnosis not present

## 2022-02-25 DIAGNOSIS — N3289 Other specified disorders of bladder: Secondary | ICD-10-CM | POA: Diagnosis not present

## 2022-02-25 DIAGNOSIS — R278 Other lack of coordination: Secondary | ICD-10-CM | POA: Diagnosis not present

## 2022-02-25 DIAGNOSIS — M1990 Unspecified osteoarthritis, unspecified site: Secondary | ICD-10-CM | POA: Diagnosis not present

## 2022-02-25 DIAGNOSIS — N390 Urinary tract infection, site not specified: Secondary | ICD-10-CM | POA: Diagnosis not present

## 2022-02-25 DIAGNOSIS — K449 Diaphragmatic hernia without obstruction or gangrene: Secondary | ICD-10-CM | POA: Diagnosis not present

## 2022-02-25 DIAGNOSIS — D61818 Other pancytopenia: Secondary | ICD-10-CM | POA: Diagnosis not present

## 2022-02-25 DIAGNOSIS — I5032 Chronic diastolic (congestive) heart failure: Secondary | ICD-10-CM | POA: Diagnosis not present

## 2022-02-25 DIAGNOSIS — R41841 Cognitive communication deficit: Secondary | ICD-10-CM | POA: Diagnosis not present

## 2022-02-25 DIAGNOSIS — Z20822 Contact with and (suspected) exposure to covid-19: Secondary | ICD-10-CM | POA: Diagnosis not present

## 2022-02-25 DIAGNOSIS — D591 Autoimmune hemolytic anemia, unspecified: Secondary | ICD-10-CM | POA: Diagnosis not present

## 2022-02-25 DIAGNOSIS — Z1621 Resistance to vancomycin: Secondary | ICD-10-CM | POA: Diagnosis not present

## 2022-03-09 DIAGNOSIS — I5032 Chronic diastolic (congestive) heart failure: Secondary | ICD-10-CM | POA: Diagnosis not present

## 2022-03-09 DIAGNOSIS — K5730 Diverticulosis of large intestine without perforation or abscess without bleeding: Secondary | ICD-10-CM | POA: Diagnosis not present

## 2022-03-09 DIAGNOSIS — M069 Rheumatoid arthritis, unspecified: Secondary | ICD-10-CM | POA: Diagnosis not present

## 2022-03-09 DIAGNOSIS — I110 Hypertensive heart disease with heart failure: Secondary | ICD-10-CM | POA: Diagnosis not present

## 2022-03-09 DIAGNOSIS — R278 Other lack of coordination: Secondary | ICD-10-CM | POA: Diagnosis not present

## 2022-03-09 DIAGNOSIS — N390 Urinary tract infection, site not specified: Secondary | ICD-10-CM | POA: Diagnosis not present

## 2022-03-09 DIAGNOSIS — R41840 Attention and concentration deficit: Secondary | ICD-10-CM | POA: Diagnosis not present

## 2022-03-09 DIAGNOSIS — N3289 Other specified disorders of bladder: Secondary | ICD-10-CM | POA: Diagnosis not present

## 2022-03-09 DIAGNOSIS — N200 Calculus of kidney: Secondary | ICD-10-CM | POA: Diagnosis not present

## 2022-03-09 DIAGNOSIS — R0789 Other chest pain: Secondary | ICD-10-CM | POA: Diagnosis not present

## 2022-03-09 DIAGNOSIS — E860 Dehydration: Secondary | ICD-10-CM | POA: Diagnosis not present

## 2022-03-09 DIAGNOSIS — R531 Weakness: Secondary | ICD-10-CM | POA: Diagnosis not present

## 2022-03-09 DIAGNOSIS — R5383 Other fatigue: Secondary | ICD-10-CM | POA: Diagnosis not present

## 2022-03-09 DIAGNOSIS — R079 Chest pain, unspecified: Secondary | ICD-10-CM | POA: Diagnosis not present

## 2022-03-09 DIAGNOSIS — I700 Atherosclerosis of aorta: Secondary | ICD-10-CM | POA: Diagnosis not present

## 2022-03-09 DIAGNOSIS — K219 Gastro-esophageal reflux disease without esophagitis: Secondary | ICD-10-CM | POA: Diagnosis not present

## 2022-03-09 DIAGNOSIS — D649 Anemia, unspecified: Secondary | ICD-10-CM | POA: Diagnosis not present

## 2022-03-09 DIAGNOSIS — R262 Difficulty in walking, not elsewhere classified: Secondary | ICD-10-CM | POA: Diagnosis not present

## 2022-03-09 DIAGNOSIS — K429 Umbilical hernia without obstruction or gangrene: Secondary | ICD-10-CM | POA: Diagnosis not present

## 2022-03-09 DIAGNOSIS — R2689 Other abnormalities of gait and mobility: Secondary | ICD-10-CM | POA: Diagnosis not present

## 2022-03-09 DIAGNOSIS — R4189 Other symptoms and signs involving cognitive functions and awareness: Secondary | ICD-10-CM | POA: Diagnosis not present

## 2022-03-09 DIAGNOSIS — B9620 Unspecified Escherichia coli [E. coli] as the cause of diseases classified elsewhere: Secondary | ICD-10-CM | POA: Diagnosis not present

## 2022-03-09 DIAGNOSIS — K449 Diaphragmatic hernia without obstruction or gangrene: Secondary | ICD-10-CM | POA: Diagnosis not present

## 2022-03-09 DIAGNOSIS — Z20822 Contact with and (suspected) exposure to covid-19: Secondary | ICD-10-CM | POA: Diagnosis not present

## 2022-03-09 DIAGNOSIS — R161 Splenomegaly, not elsewhere classified: Secondary | ICD-10-CM | POA: Diagnosis not present

## 2022-03-09 DIAGNOSIS — E039 Hypothyroidism, unspecified: Secondary | ICD-10-CM | POA: Diagnosis not present

## 2022-03-09 DIAGNOSIS — Z66 Do not resuscitate: Secondary | ICD-10-CM | POA: Diagnosis not present

## 2022-03-10 DIAGNOSIS — N390 Urinary tract infection, site not specified: Secondary | ICD-10-CM | POA: Diagnosis not present

## 2022-03-10 DIAGNOSIS — R531 Weakness: Secondary | ICD-10-CM | POA: Diagnosis not present

## 2022-03-10 DIAGNOSIS — D649 Anemia, unspecified: Secondary | ICD-10-CM | POA: Diagnosis not present

## 2022-03-10 DIAGNOSIS — Z8719 Personal history of other diseases of the digestive system: Secondary | ICD-10-CM | POA: Diagnosis not present

## 2022-03-10 DIAGNOSIS — I959 Hypotension, unspecified: Secondary | ICD-10-CM | POA: Diagnosis not present

## 2022-03-10 DIAGNOSIS — E860 Dehydration: Secondary | ICD-10-CM | POA: Diagnosis not present

## 2022-03-10 DIAGNOSIS — N179 Acute kidney failure, unspecified: Secondary | ICD-10-CM | POA: Diagnosis not present

## 2022-03-11 DIAGNOSIS — R079 Chest pain, unspecified: Secondary | ICD-10-CM | POA: Diagnosis not present

## 2022-03-11 DIAGNOSIS — D649 Anemia, unspecified: Secondary | ICD-10-CM | POA: Diagnosis not present

## 2022-03-11 DIAGNOSIS — B9620 Unspecified Escherichia coli [E. coli] as the cause of diseases classified elsewhere: Secondary | ICD-10-CM | POA: Diagnosis not present

## 2022-03-11 DIAGNOSIS — I959 Hypotension, unspecified: Secondary | ICD-10-CM | POA: Diagnosis not present

## 2022-03-11 DIAGNOSIS — R531 Weakness: Secondary | ICD-10-CM | POA: Diagnosis not present

## 2022-03-11 DIAGNOSIS — N390 Urinary tract infection, site not specified: Secondary | ICD-10-CM | POA: Diagnosis not present

## 2022-03-17 DIAGNOSIS — D649 Anemia, unspecified: Secondary | ICD-10-CM | POA: Diagnosis not present

## 2022-03-17 DIAGNOSIS — C8590 Non-Hodgkin lymphoma, unspecified, unspecified site: Secondary | ICD-10-CM | POA: Diagnosis not present

## 2022-03-19 DIAGNOSIS — I5030 Unspecified diastolic (congestive) heart failure: Secondary | ICD-10-CM | POA: Diagnosis not present

## 2022-03-19 DIAGNOSIS — K219 Gastro-esophageal reflux disease without esophagitis: Secondary | ICD-10-CM | POA: Diagnosis not present

## 2022-03-19 DIAGNOSIS — B952 Enterococcus as the cause of diseases classified elsewhere: Secondary | ICD-10-CM | POA: Diagnosis not present

## 2022-03-19 DIAGNOSIS — D591 Autoimmune hemolytic anemia, unspecified: Secondary | ICD-10-CM | POA: Diagnosis not present

## 2022-03-19 DIAGNOSIS — Z87440 Personal history of urinary (tract) infections: Secondary | ICD-10-CM | POA: Diagnosis not present

## 2022-03-19 DIAGNOSIS — G629 Polyneuropathy, unspecified: Secondary | ICD-10-CM | POA: Diagnosis not present

## 2022-03-19 DIAGNOSIS — K5790 Diverticulosis of intestine, part unspecified, without perforation or abscess without bleeding: Secondary | ICD-10-CM | POA: Diagnosis not present

## 2022-03-19 DIAGNOSIS — G4089 Other seizures: Secondary | ICD-10-CM | POA: Diagnosis not present

## 2022-03-19 DIAGNOSIS — M1990 Unspecified osteoarthritis, unspecified site: Secondary | ICD-10-CM | POA: Diagnosis not present

## 2022-03-19 DIAGNOSIS — Z1621 Resistance to vancomycin: Secondary | ICD-10-CM | POA: Diagnosis not present

## 2022-03-19 DIAGNOSIS — M069 Rheumatoid arthritis, unspecified: Secondary | ICD-10-CM | POA: Diagnosis not present

## 2022-03-19 DIAGNOSIS — K449 Diaphragmatic hernia without obstruction or gangrene: Secondary | ICD-10-CM | POA: Diagnosis not present

## 2022-04-11 DIAGNOSIS — G629 Polyneuropathy, unspecified: Secondary | ICD-10-CM | POA: Diagnosis not present

## 2022-04-11 DIAGNOSIS — K5790 Diverticulosis of intestine, part unspecified, without perforation or abscess without bleeding: Secondary | ICD-10-CM | POA: Diagnosis not present

## 2022-04-11 DIAGNOSIS — I5030 Unspecified diastolic (congestive) heart failure: Secondary | ICD-10-CM | POA: Diagnosis not present

## 2022-04-11 DIAGNOSIS — G4089 Other seizures: Secondary | ICD-10-CM | POA: Diagnosis not present

## 2022-04-11 DIAGNOSIS — K219 Gastro-esophageal reflux disease without esophagitis: Secondary | ICD-10-CM | POA: Diagnosis not present

## 2022-04-18 DIAGNOSIS — K219 Gastro-esophageal reflux disease without esophagitis: Secondary | ICD-10-CM | POA: Diagnosis not present

## 2022-04-18 DIAGNOSIS — M069 Rheumatoid arthritis, unspecified: Secondary | ICD-10-CM | POA: Diagnosis not present

## 2022-04-18 DIAGNOSIS — G4089 Other seizures: Secondary | ICD-10-CM | POA: Diagnosis not present

## 2022-04-18 DIAGNOSIS — Z1621 Resistance to vancomycin: Secondary | ICD-10-CM | POA: Diagnosis not present

## 2022-04-18 DIAGNOSIS — B952 Enterococcus as the cause of diseases classified elsewhere: Secondary | ICD-10-CM | POA: Diagnosis not present

## 2022-04-18 DIAGNOSIS — I5030 Unspecified diastolic (congestive) heart failure: Secondary | ICD-10-CM | POA: Diagnosis not present

## 2022-04-18 DIAGNOSIS — M1990 Unspecified osteoarthritis, unspecified site: Secondary | ICD-10-CM | POA: Diagnosis not present

## 2022-04-18 DIAGNOSIS — D591 Autoimmune hemolytic anemia, unspecified: Secondary | ICD-10-CM | POA: Diagnosis not present

## 2022-04-18 DIAGNOSIS — Z87440 Personal history of urinary (tract) infections: Secondary | ICD-10-CM | POA: Diagnosis not present

## 2022-04-18 DIAGNOSIS — K449 Diaphragmatic hernia without obstruction or gangrene: Secondary | ICD-10-CM | POA: Diagnosis not present

## 2022-04-18 DIAGNOSIS — G629 Polyneuropathy, unspecified: Secondary | ICD-10-CM | POA: Diagnosis not present

## 2022-04-18 DIAGNOSIS — K5790 Diverticulosis of intestine, part unspecified, without perforation or abscess without bleeding: Secondary | ICD-10-CM | POA: Diagnosis not present

## 2022-04-22 DIAGNOSIS — R410 Disorientation, unspecified: Secondary | ICD-10-CM | POA: Diagnosis not present

## 2022-04-22 DIAGNOSIS — R8290 Unspecified abnormal findings in urine: Secondary | ICD-10-CM | POA: Diagnosis not present

## 2022-04-22 DIAGNOSIS — R309 Painful micturition, unspecified: Secondary | ICD-10-CM | POA: Diagnosis not present

## 2022-05-05 DIAGNOSIS — Z98810 Dental sealant status: Secondary | ICD-10-CM | POA: Diagnosis not present

## 2022-05-05 DIAGNOSIS — I739 Peripheral vascular disease, unspecified: Secondary | ICD-10-CM | POA: Diagnosis not present

## 2022-05-05 DIAGNOSIS — I5032 Chronic diastolic (congestive) heart failure: Secondary | ICD-10-CM | POA: Diagnosis not present

## 2022-05-05 DIAGNOSIS — Z7982 Long term (current) use of aspirin: Secondary | ICD-10-CM | POA: Diagnosis not present

## 2022-05-05 DIAGNOSIS — M19072 Primary osteoarthritis, left ankle and foot: Secondary | ICD-10-CM | POA: Diagnosis not present

## 2022-05-05 DIAGNOSIS — I110 Hypertensive heart disease with heart failure: Secondary | ICD-10-CM | POA: Diagnosis not present

## 2022-05-05 DIAGNOSIS — Z7951 Long term (current) use of inhaled steroids: Secondary | ICD-10-CM | POA: Diagnosis not present

## 2022-05-19 DIAGNOSIS — D591 Autoimmune hemolytic anemia, unspecified: Secondary | ICD-10-CM | POA: Diagnosis not present

## 2022-05-19 DIAGNOSIS — Z87440 Personal history of urinary (tract) infections: Secondary | ICD-10-CM | POA: Diagnosis not present

## 2022-05-19 DIAGNOSIS — G4089 Other seizures: Secondary | ICD-10-CM | POA: Diagnosis not present

## 2022-05-19 DIAGNOSIS — K5790 Diverticulosis of intestine, part unspecified, without perforation or abscess without bleeding: Secondary | ICD-10-CM | POA: Diagnosis not present

## 2022-05-19 DIAGNOSIS — B952 Enterococcus as the cause of diseases classified elsewhere: Secondary | ICD-10-CM | POA: Diagnosis not present

## 2022-05-19 DIAGNOSIS — M1990 Unspecified osteoarthritis, unspecified site: Secondary | ICD-10-CM | POA: Diagnosis not present

## 2022-05-19 DIAGNOSIS — I5030 Unspecified diastolic (congestive) heart failure: Secondary | ICD-10-CM | POA: Diagnosis not present

## 2022-05-19 DIAGNOSIS — G629 Polyneuropathy, unspecified: Secondary | ICD-10-CM | POA: Diagnosis not present

## 2022-05-19 DIAGNOSIS — K219 Gastro-esophageal reflux disease without esophagitis: Secondary | ICD-10-CM | POA: Diagnosis not present

## 2022-05-19 DIAGNOSIS — M069 Rheumatoid arthritis, unspecified: Secondary | ICD-10-CM | POA: Diagnosis not present

## 2022-05-19 DIAGNOSIS — K449 Diaphragmatic hernia without obstruction or gangrene: Secondary | ICD-10-CM | POA: Diagnosis not present

## 2022-05-19 DIAGNOSIS — Z1621 Resistance to vancomycin: Secondary | ICD-10-CM | POA: Diagnosis not present

## 2022-06-18 DIAGNOSIS — Z1621 Resistance to vancomycin: Secondary | ICD-10-CM | POA: Diagnosis not present

## 2022-06-18 DIAGNOSIS — K219 Gastro-esophageal reflux disease without esophagitis: Secondary | ICD-10-CM | POA: Diagnosis not present

## 2022-06-18 DIAGNOSIS — G4089 Other seizures: Secondary | ICD-10-CM | POA: Diagnosis not present

## 2022-06-18 DIAGNOSIS — M1990 Unspecified osteoarthritis, unspecified site: Secondary | ICD-10-CM | POA: Diagnosis not present

## 2022-06-18 DIAGNOSIS — M069 Rheumatoid arthritis, unspecified: Secondary | ICD-10-CM | POA: Diagnosis not present

## 2022-06-18 DIAGNOSIS — D591 Autoimmune hemolytic anemia, unspecified: Secondary | ICD-10-CM | POA: Diagnosis not present

## 2022-06-18 DIAGNOSIS — Z87440 Personal history of urinary (tract) infections: Secondary | ICD-10-CM | POA: Diagnosis not present

## 2022-06-18 DIAGNOSIS — G629 Polyneuropathy, unspecified: Secondary | ICD-10-CM | POA: Diagnosis not present

## 2022-06-18 DIAGNOSIS — I5030 Unspecified diastolic (congestive) heart failure: Secondary | ICD-10-CM | POA: Diagnosis not present

## 2022-06-18 DIAGNOSIS — K449 Diaphragmatic hernia without obstruction or gangrene: Secondary | ICD-10-CM | POA: Diagnosis not present

## 2022-06-18 DIAGNOSIS — B952 Enterococcus as the cause of diseases classified elsewhere: Secondary | ICD-10-CM | POA: Diagnosis not present

## 2022-06-18 DIAGNOSIS — K5790 Diverticulosis of intestine, part unspecified, without perforation or abscess without bleeding: Secondary | ICD-10-CM | POA: Diagnosis not present

## 2022-06-23 DIAGNOSIS — I5030 Unspecified diastolic (congestive) heart failure: Secondary | ICD-10-CM | POA: Diagnosis not present

## 2022-06-23 DIAGNOSIS — K219 Gastro-esophageal reflux disease without esophagitis: Secondary | ICD-10-CM | POA: Diagnosis not present

## 2022-06-23 DIAGNOSIS — G4089 Other seizures: Secondary | ICD-10-CM | POA: Diagnosis not present

## 2022-06-23 DIAGNOSIS — G629 Polyneuropathy, unspecified: Secondary | ICD-10-CM | POA: Diagnosis not present

## 2022-06-23 DIAGNOSIS — K5790 Diverticulosis of intestine, part unspecified, without perforation or abscess without bleeding: Secondary | ICD-10-CM | POA: Diagnosis not present

## 2022-06-23 DIAGNOSIS — D591 Autoimmune hemolytic anemia, unspecified: Secondary | ICD-10-CM | POA: Diagnosis not present

## 2022-06-24 DIAGNOSIS — I5030 Unspecified diastolic (congestive) heart failure: Secondary | ICD-10-CM | POA: Diagnosis not present

## 2022-06-24 DIAGNOSIS — K219 Gastro-esophageal reflux disease without esophagitis: Secondary | ICD-10-CM | POA: Diagnosis not present

## 2022-06-24 DIAGNOSIS — G629 Polyneuropathy, unspecified: Secondary | ICD-10-CM | POA: Diagnosis not present

## 2022-06-24 DIAGNOSIS — K5790 Diverticulosis of intestine, part unspecified, without perforation or abscess without bleeding: Secondary | ICD-10-CM | POA: Diagnosis not present

## 2022-06-24 DIAGNOSIS — D591 Autoimmune hemolytic anemia, unspecified: Secondary | ICD-10-CM | POA: Diagnosis not present

## 2022-06-24 DIAGNOSIS — G4089 Other seizures: Secondary | ICD-10-CM | POA: Diagnosis not present

## 2022-06-27 DIAGNOSIS — K5790 Diverticulosis of intestine, part unspecified, without perforation or abscess without bleeding: Secondary | ICD-10-CM | POA: Diagnosis not present

## 2022-06-27 DIAGNOSIS — D591 Autoimmune hemolytic anemia, unspecified: Secondary | ICD-10-CM | POA: Diagnosis not present

## 2022-06-27 DIAGNOSIS — G4089 Other seizures: Secondary | ICD-10-CM | POA: Diagnosis not present

## 2022-06-27 DIAGNOSIS — K219 Gastro-esophageal reflux disease without esophagitis: Secondary | ICD-10-CM | POA: Diagnosis not present

## 2022-06-27 DIAGNOSIS — I5030 Unspecified diastolic (congestive) heart failure: Secondary | ICD-10-CM | POA: Diagnosis not present

## 2022-06-27 DIAGNOSIS — G629 Polyneuropathy, unspecified: Secondary | ICD-10-CM | POA: Diagnosis not present

## 2022-06-29 DIAGNOSIS — K5790 Diverticulosis of intestine, part unspecified, without perforation or abscess without bleeding: Secondary | ICD-10-CM | POA: Diagnosis not present

## 2022-06-29 DIAGNOSIS — K219 Gastro-esophageal reflux disease without esophagitis: Secondary | ICD-10-CM | POA: Diagnosis not present

## 2022-06-29 DIAGNOSIS — G629 Polyneuropathy, unspecified: Secondary | ICD-10-CM | POA: Diagnosis not present

## 2022-06-29 DIAGNOSIS — I5030 Unspecified diastolic (congestive) heart failure: Secondary | ICD-10-CM | POA: Diagnosis not present

## 2022-06-29 DIAGNOSIS — G4089 Other seizures: Secondary | ICD-10-CM | POA: Diagnosis not present

## 2022-06-29 DIAGNOSIS — D591 Autoimmune hemolytic anemia, unspecified: Secondary | ICD-10-CM | POA: Diagnosis not present

## 2022-07-01 DIAGNOSIS — G629 Polyneuropathy, unspecified: Secondary | ICD-10-CM | POA: Diagnosis not present

## 2022-07-01 DIAGNOSIS — I5030 Unspecified diastolic (congestive) heart failure: Secondary | ICD-10-CM | POA: Diagnosis not present

## 2022-07-01 DIAGNOSIS — D591 Autoimmune hemolytic anemia, unspecified: Secondary | ICD-10-CM | POA: Diagnosis not present

## 2022-07-01 DIAGNOSIS — K219 Gastro-esophageal reflux disease without esophagitis: Secondary | ICD-10-CM | POA: Diagnosis not present

## 2022-07-01 DIAGNOSIS — G4089 Other seizures: Secondary | ICD-10-CM | POA: Diagnosis not present

## 2022-07-01 DIAGNOSIS — K5790 Diverticulosis of intestine, part unspecified, without perforation or abscess without bleeding: Secondary | ICD-10-CM | POA: Diagnosis not present

## 2022-07-02 DIAGNOSIS — G629 Polyneuropathy, unspecified: Secondary | ICD-10-CM | POA: Diagnosis not present

## 2022-07-02 DIAGNOSIS — G4089 Other seizures: Secondary | ICD-10-CM | POA: Diagnosis not present

## 2022-07-02 DIAGNOSIS — K5790 Diverticulosis of intestine, part unspecified, without perforation or abscess without bleeding: Secondary | ICD-10-CM | POA: Diagnosis not present

## 2022-07-02 DIAGNOSIS — D591 Autoimmune hemolytic anemia, unspecified: Secondary | ICD-10-CM | POA: Diagnosis not present

## 2022-07-02 DIAGNOSIS — K219 Gastro-esophageal reflux disease without esophagitis: Secondary | ICD-10-CM | POA: Diagnosis not present

## 2022-07-02 DIAGNOSIS — I5030 Unspecified diastolic (congestive) heart failure: Secondary | ICD-10-CM | POA: Diagnosis not present

## 2022-07-04 DIAGNOSIS — D591 Autoimmune hemolytic anemia, unspecified: Secondary | ICD-10-CM | POA: Diagnosis not present

## 2022-07-04 DIAGNOSIS — G4089 Other seizures: Secondary | ICD-10-CM | POA: Diagnosis not present

## 2022-07-04 DIAGNOSIS — K5790 Diverticulosis of intestine, part unspecified, without perforation or abscess without bleeding: Secondary | ICD-10-CM | POA: Diagnosis not present

## 2022-07-04 DIAGNOSIS — G629 Polyneuropathy, unspecified: Secondary | ICD-10-CM | POA: Diagnosis not present

## 2022-07-04 DIAGNOSIS — I5030 Unspecified diastolic (congestive) heart failure: Secondary | ICD-10-CM | POA: Diagnosis not present

## 2022-07-04 DIAGNOSIS — K219 Gastro-esophageal reflux disease without esophagitis: Secondary | ICD-10-CM | POA: Diagnosis not present

## 2022-07-05 DIAGNOSIS — G4089 Other seizures: Secondary | ICD-10-CM | POA: Diagnosis not present

## 2022-07-05 DIAGNOSIS — K5790 Diverticulosis of intestine, part unspecified, without perforation or abscess without bleeding: Secondary | ICD-10-CM | POA: Diagnosis not present

## 2022-07-05 DIAGNOSIS — I5030 Unspecified diastolic (congestive) heart failure: Secondary | ICD-10-CM | POA: Diagnosis not present

## 2022-07-05 DIAGNOSIS — G629 Polyneuropathy, unspecified: Secondary | ICD-10-CM | POA: Diagnosis not present

## 2022-07-05 DIAGNOSIS — K219 Gastro-esophageal reflux disease without esophagitis: Secondary | ICD-10-CM | POA: Diagnosis not present

## 2022-07-05 DIAGNOSIS — D591 Autoimmune hemolytic anemia, unspecified: Secondary | ICD-10-CM | POA: Diagnosis not present

## 2022-07-08 DIAGNOSIS — D591 Autoimmune hemolytic anemia, unspecified: Secondary | ICD-10-CM | POA: Diagnosis not present

## 2022-07-08 DIAGNOSIS — I5030 Unspecified diastolic (congestive) heart failure: Secondary | ICD-10-CM | POA: Diagnosis not present

## 2022-07-08 DIAGNOSIS — K219 Gastro-esophageal reflux disease without esophagitis: Secondary | ICD-10-CM | POA: Diagnosis not present

## 2022-07-08 DIAGNOSIS — G4089 Other seizures: Secondary | ICD-10-CM | POA: Diagnosis not present

## 2022-07-08 DIAGNOSIS — G629 Polyneuropathy, unspecified: Secondary | ICD-10-CM | POA: Diagnosis not present

## 2022-07-08 DIAGNOSIS — K5790 Diverticulosis of intestine, part unspecified, without perforation or abscess without bleeding: Secondary | ICD-10-CM | POA: Diagnosis not present

## 2022-07-13 DIAGNOSIS — G4089 Other seizures: Secondary | ICD-10-CM | POA: Diagnosis not present

## 2022-07-13 DIAGNOSIS — K219 Gastro-esophageal reflux disease without esophagitis: Secondary | ICD-10-CM | POA: Diagnosis not present

## 2022-07-13 DIAGNOSIS — G629 Polyneuropathy, unspecified: Secondary | ICD-10-CM | POA: Diagnosis not present

## 2022-07-13 DIAGNOSIS — K5790 Diverticulosis of intestine, part unspecified, without perforation or abscess without bleeding: Secondary | ICD-10-CM | POA: Diagnosis not present

## 2022-07-13 DIAGNOSIS — I5030 Unspecified diastolic (congestive) heart failure: Secondary | ICD-10-CM | POA: Diagnosis not present

## 2022-07-13 DIAGNOSIS — D591 Autoimmune hemolytic anemia, unspecified: Secondary | ICD-10-CM | POA: Diagnosis not present

## 2022-07-15 DIAGNOSIS — I5030 Unspecified diastolic (congestive) heart failure: Secondary | ICD-10-CM | POA: Diagnosis not present

## 2022-07-15 DIAGNOSIS — K219 Gastro-esophageal reflux disease without esophagitis: Secondary | ICD-10-CM | POA: Diagnosis not present

## 2022-07-15 DIAGNOSIS — D591 Autoimmune hemolytic anemia, unspecified: Secondary | ICD-10-CM | POA: Diagnosis not present

## 2022-07-15 DIAGNOSIS — G629 Polyneuropathy, unspecified: Secondary | ICD-10-CM | POA: Diagnosis not present

## 2022-07-15 DIAGNOSIS — G4089 Other seizures: Secondary | ICD-10-CM | POA: Diagnosis not present

## 2022-07-15 DIAGNOSIS — K5790 Diverticulosis of intestine, part unspecified, without perforation or abscess without bleeding: Secondary | ICD-10-CM | POA: Diagnosis not present

## 2022-07-16 DIAGNOSIS — G629 Polyneuropathy, unspecified: Secondary | ICD-10-CM | POA: Diagnosis not present

## 2022-07-16 DIAGNOSIS — K219 Gastro-esophageal reflux disease without esophagitis: Secondary | ICD-10-CM | POA: Diagnosis not present

## 2022-07-16 DIAGNOSIS — K5790 Diverticulosis of intestine, part unspecified, without perforation or abscess without bleeding: Secondary | ICD-10-CM | POA: Diagnosis not present

## 2022-07-16 DIAGNOSIS — G4089 Other seizures: Secondary | ICD-10-CM | POA: Diagnosis not present

## 2022-07-16 DIAGNOSIS — D591 Autoimmune hemolytic anemia, unspecified: Secondary | ICD-10-CM | POA: Diagnosis not present

## 2022-07-16 DIAGNOSIS — I5030 Unspecified diastolic (congestive) heart failure: Secondary | ICD-10-CM | POA: Diagnosis not present

## 2022-07-19 DIAGNOSIS — Z87440 Personal history of urinary (tract) infections: Secondary | ICD-10-CM | POA: Diagnosis not present

## 2022-07-19 DIAGNOSIS — B952 Enterococcus as the cause of diseases classified elsewhere: Secondary | ICD-10-CM | POA: Diagnosis not present

## 2022-07-19 DIAGNOSIS — K5790 Diverticulosis of intestine, part unspecified, without perforation or abscess without bleeding: Secondary | ICD-10-CM | POA: Diagnosis not present

## 2022-07-19 DIAGNOSIS — G4089 Other seizures: Secondary | ICD-10-CM | POA: Diagnosis not present

## 2022-07-19 DIAGNOSIS — M069 Rheumatoid arthritis, unspecified: Secondary | ICD-10-CM | POA: Diagnosis not present

## 2022-07-19 DIAGNOSIS — G629 Polyneuropathy, unspecified: Secondary | ICD-10-CM | POA: Diagnosis not present

## 2022-07-19 DIAGNOSIS — D591 Autoimmune hemolytic anemia, unspecified: Secondary | ICD-10-CM | POA: Diagnosis not present

## 2022-07-19 DIAGNOSIS — M1990 Unspecified osteoarthritis, unspecified site: Secondary | ICD-10-CM | POA: Diagnosis not present

## 2022-07-19 DIAGNOSIS — K219 Gastro-esophageal reflux disease without esophagitis: Secondary | ICD-10-CM | POA: Diagnosis not present

## 2022-07-19 DIAGNOSIS — K449 Diaphragmatic hernia without obstruction or gangrene: Secondary | ICD-10-CM | POA: Diagnosis not present

## 2022-07-19 DIAGNOSIS — I5030 Unspecified diastolic (congestive) heart failure: Secondary | ICD-10-CM | POA: Diagnosis not present

## 2022-07-19 DIAGNOSIS — Z1621 Resistance to vancomycin: Secondary | ICD-10-CM | POA: Diagnosis not present

## 2022-07-20 DIAGNOSIS — I5030 Unspecified diastolic (congestive) heart failure: Secondary | ICD-10-CM | POA: Diagnosis not present

## 2022-07-20 DIAGNOSIS — D591 Autoimmune hemolytic anemia, unspecified: Secondary | ICD-10-CM | POA: Diagnosis not present

## 2022-07-20 DIAGNOSIS — G4089 Other seizures: Secondary | ICD-10-CM | POA: Diagnosis not present

## 2022-07-20 DIAGNOSIS — K5790 Diverticulosis of intestine, part unspecified, without perforation or abscess without bleeding: Secondary | ICD-10-CM | POA: Diagnosis not present

## 2022-07-20 DIAGNOSIS — G629 Polyneuropathy, unspecified: Secondary | ICD-10-CM | POA: Diagnosis not present

## 2022-07-20 DIAGNOSIS — K219 Gastro-esophageal reflux disease without esophagitis: Secondary | ICD-10-CM | POA: Diagnosis not present

## 2022-07-22 DIAGNOSIS — G4089 Other seizures: Secondary | ICD-10-CM | POA: Diagnosis not present

## 2022-07-22 DIAGNOSIS — K5790 Diverticulosis of intestine, part unspecified, without perforation or abscess without bleeding: Secondary | ICD-10-CM | POA: Diagnosis not present

## 2022-07-22 DIAGNOSIS — I5030 Unspecified diastolic (congestive) heart failure: Secondary | ICD-10-CM | POA: Diagnosis not present

## 2022-07-22 DIAGNOSIS — K219 Gastro-esophageal reflux disease without esophagitis: Secondary | ICD-10-CM | POA: Diagnosis not present

## 2022-07-22 DIAGNOSIS — D591 Autoimmune hemolytic anemia, unspecified: Secondary | ICD-10-CM | POA: Diagnosis not present

## 2022-07-22 DIAGNOSIS — G629 Polyneuropathy, unspecified: Secondary | ICD-10-CM | POA: Diagnosis not present

## 2022-07-27 DIAGNOSIS — G4089 Other seizures: Secondary | ICD-10-CM | POA: Diagnosis not present

## 2022-07-27 DIAGNOSIS — K5790 Diverticulosis of intestine, part unspecified, without perforation or abscess without bleeding: Secondary | ICD-10-CM | POA: Diagnosis not present

## 2022-07-27 DIAGNOSIS — I5030 Unspecified diastolic (congestive) heart failure: Secondary | ICD-10-CM | POA: Diagnosis not present

## 2022-07-27 DIAGNOSIS — G629 Polyneuropathy, unspecified: Secondary | ICD-10-CM | POA: Diagnosis not present

## 2022-07-27 DIAGNOSIS — K219 Gastro-esophageal reflux disease without esophagitis: Secondary | ICD-10-CM | POA: Diagnosis not present

## 2022-07-27 DIAGNOSIS — D591 Autoimmune hemolytic anemia, unspecified: Secondary | ICD-10-CM | POA: Diagnosis not present

## 2022-07-29 DIAGNOSIS — D591 Autoimmune hemolytic anemia, unspecified: Secondary | ICD-10-CM | POA: Diagnosis not present

## 2022-07-29 DIAGNOSIS — G629 Polyneuropathy, unspecified: Secondary | ICD-10-CM | POA: Diagnosis not present

## 2022-07-29 DIAGNOSIS — I5030 Unspecified diastolic (congestive) heart failure: Secondary | ICD-10-CM | POA: Diagnosis not present

## 2022-07-29 DIAGNOSIS — K5790 Diverticulosis of intestine, part unspecified, without perforation or abscess without bleeding: Secondary | ICD-10-CM | POA: Diagnosis not present

## 2022-07-29 DIAGNOSIS — G4089 Other seizures: Secondary | ICD-10-CM | POA: Diagnosis not present

## 2022-07-29 DIAGNOSIS — K219 Gastro-esophageal reflux disease without esophagitis: Secondary | ICD-10-CM | POA: Diagnosis not present

## 2022-07-31 DIAGNOSIS — K5790 Diverticulosis of intestine, part unspecified, without perforation or abscess without bleeding: Secondary | ICD-10-CM | POA: Diagnosis not present

## 2022-07-31 DIAGNOSIS — K219 Gastro-esophageal reflux disease without esophagitis: Secondary | ICD-10-CM | POA: Diagnosis not present

## 2022-07-31 DIAGNOSIS — G629 Polyneuropathy, unspecified: Secondary | ICD-10-CM | POA: Diagnosis not present

## 2022-07-31 DIAGNOSIS — D591 Autoimmune hemolytic anemia, unspecified: Secondary | ICD-10-CM | POA: Diagnosis not present

## 2022-07-31 DIAGNOSIS — G4089 Other seizures: Secondary | ICD-10-CM | POA: Diagnosis not present

## 2022-07-31 DIAGNOSIS — I5030 Unspecified diastolic (congestive) heart failure: Secondary | ICD-10-CM | POA: Diagnosis not present

## 2022-08-03 DIAGNOSIS — I5030 Unspecified diastolic (congestive) heart failure: Secondary | ICD-10-CM | POA: Diagnosis not present

## 2022-08-03 DIAGNOSIS — K5790 Diverticulosis of intestine, part unspecified, without perforation or abscess without bleeding: Secondary | ICD-10-CM | POA: Diagnosis not present

## 2022-08-03 DIAGNOSIS — D591 Autoimmune hemolytic anemia, unspecified: Secondary | ICD-10-CM | POA: Diagnosis not present

## 2022-08-03 DIAGNOSIS — G629 Polyneuropathy, unspecified: Secondary | ICD-10-CM | POA: Diagnosis not present

## 2022-08-03 DIAGNOSIS — K219 Gastro-esophageal reflux disease without esophagitis: Secondary | ICD-10-CM | POA: Diagnosis not present

## 2022-08-03 DIAGNOSIS — G4089 Other seizures: Secondary | ICD-10-CM | POA: Diagnosis not present

## 2022-08-05 DIAGNOSIS — D591 Autoimmune hemolytic anemia, unspecified: Secondary | ICD-10-CM | POA: Diagnosis not present

## 2022-08-05 DIAGNOSIS — I5030 Unspecified diastolic (congestive) heart failure: Secondary | ICD-10-CM | POA: Diagnosis not present

## 2022-08-05 DIAGNOSIS — K5790 Diverticulosis of intestine, part unspecified, without perforation or abscess without bleeding: Secondary | ICD-10-CM | POA: Diagnosis not present

## 2022-08-05 DIAGNOSIS — K219 Gastro-esophageal reflux disease without esophagitis: Secondary | ICD-10-CM | POA: Diagnosis not present

## 2022-08-05 DIAGNOSIS — G4089 Other seizures: Secondary | ICD-10-CM | POA: Diagnosis not present

## 2022-08-05 DIAGNOSIS — G629 Polyneuropathy, unspecified: Secondary | ICD-10-CM | POA: Diagnosis not present

## 2022-08-07 DIAGNOSIS — D591 Autoimmune hemolytic anemia, unspecified: Secondary | ICD-10-CM | POA: Diagnosis not present

## 2022-08-07 DIAGNOSIS — I5030 Unspecified diastolic (congestive) heart failure: Secondary | ICD-10-CM | POA: Diagnosis not present

## 2022-08-07 DIAGNOSIS — K219 Gastro-esophageal reflux disease without esophagitis: Secondary | ICD-10-CM | POA: Diagnosis not present

## 2022-08-07 DIAGNOSIS — G629 Polyneuropathy, unspecified: Secondary | ICD-10-CM | POA: Diagnosis not present

## 2022-08-07 DIAGNOSIS — K5790 Diverticulosis of intestine, part unspecified, without perforation or abscess without bleeding: Secondary | ICD-10-CM | POA: Diagnosis not present

## 2022-08-07 DIAGNOSIS — G4089 Other seizures: Secondary | ICD-10-CM | POA: Diagnosis not present

## 2022-08-08 DIAGNOSIS — G629 Polyneuropathy, unspecified: Secondary | ICD-10-CM | POA: Diagnosis not present

## 2022-08-08 DIAGNOSIS — I5030 Unspecified diastolic (congestive) heart failure: Secondary | ICD-10-CM | POA: Diagnosis not present

## 2022-08-08 DIAGNOSIS — G4089 Other seizures: Secondary | ICD-10-CM | POA: Diagnosis not present

## 2022-08-08 DIAGNOSIS — K5790 Diverticulosis of intestine, part unspecified, without perforation or abscess without bleeding: Secondary | ICD-10-CM | POA: Diagnosis not present

## 2022-08-08 DIAGNOSIS — D591 Autoimmune hemolytic anemia, unspecified: Secondary | ICD-10-CM | POA: Diagnosis not present

## 2022-08-08 DIAGNOSIS — K219 Gastro-esophageal reflux disease without esophagitis: Secondary | ICD-10-CM | POA: Diagnosis not present

## 2022-08-10 DIAGNOSIS — G629 Polyneuropathy, unspecified: Secondary | ICD-10-CM | POA: Diagnosis not present

## 2022-08-10 DIAGNOSIS — K219 Gastro-esophageal reflux disease without esophagitis: Secondary | ICD-10-CM | POA: Diagnosis not present

## 2022-08-10 DIAGNOSIS — K5790 Diverticulosis of intestine, part unspecified, without perforation or abscess without bleeding: Secondary | ICD-10-CM | POA: Diagnosis not present

## 2022-08-10 DIAGNOSIS — D591 Autoimmune hemolytic anemia, unspecified: Secondary | ICD-10-CM | POA: Diagnosis not present

## 2022-08-10 DIAGNOSIS — I5030 Unspecified diastolic (congestive) heart failure: Secondary | ICD-10-CM | POA: Diagnosis not present

## 2022-08-10 DIAGNOSIS — G4089 Other seizures: Secondary | ICD-10-CM | POA: Diagnosis not present

## 2022-08-12 DIAGNOSIS — G629 Polyneuropathy, unspecified: Secondary | ICD-10-CM | POA: Diagnosis not present

## 2022-08-12 DIAGNOSIS — K5790 Diverticulosis of intestine, part unspecified, without perforation or abscess without bleeding: Secondary | ICD-10-CM | POA: Diagnosis not present

## 2022-08-12 DIAGNOSIS — D591 Autoimmune hemolytic anemia, unspecified: Secondary | ICD-10-CM | POA: Diagnosis not present

## 2022-08-12 DIAGNOSIS — I5030 Unspecified diastolic (congestive) heart failure: Secondary | ICD-10-CM | POA: Diagnosis not present

## 2022-08-12 DIAGNOSIS — G4089 Other seizures: Secondary | ICD-10-CM | POA: Diagnosis not present

## 2022-08-12 DIAGNOSIS — K219 Gastro-esophageal reflux disease without esophagitis: Secondary | ICD-10-CM | POA: Diagnosis not present

## 2022-08-15 DIAGNOSIS — D591 Autoimmune hemolytic anemia, unspecified: Secondary | ICD-10-CM | POA: Diagnosis not present

## 2022-08-15 DIAGNOSIS — K5790 Diverticulosis of intestine, part unspecified, without perforation or abscess without bleeding: Secondary | ICD-10-CM | POA: Diagnosis not present

## 2022-08-15 DIAGNOSIS — G4089 Other seizures: Secondary | ICD-10-CM | POA: Diagnosis not present

## 2022-08-15 DIAGNOSIS — I5030 Unspecified diastolic (congestive) heart failure: Secondary | ICD-10-CM | POA: Diagnosis not present

## 2022-08-15 DIAGNOSIS — K219 Gastro-esophageal reflux disease without esophagitis: Secondary | ICD-10-CM | POA: Diagnosis not present

## 2022-08-15 DIAGNOSIS — G629 Polyneuropathy, unspecified: Secondary | ICD-10-CM | POA: Diagnosis not present

## 2022-08-17 DIAGNOSIS — I5030 Unspecified diastolic (congestive) heart failure: Secondary | ICD-10-CM | POA: Diagnosis not present

## 2022-08-17 DIAGNOSIS — G629 Polyneuropathy, unspecified: Secondary | ICD-10-CM | POA: Diagnosis not present

## 2022-08-17 DIAGNOSIS — K5790 Diverticulosis of intestine, part unspecified, without perforation or abscess without bleeding: Secondary | ICD-10-CM | POA: Diagnosis not present

## 2022-08-17 DIAGNOSIS — K219 Gastro-esophageal reflux disease without esophagitis: Secondary | ICD-10-CM | POA: Diagnosis not present

## 2022-08-17 DIAGNOSIS — G4089 Other seizures: Secondary | ICD-10-CM | POA: Diagnosis not present

## 2022-08-17 DIAGNOSIS — D591 Autoimmune hemolytic anemia, unspecified: Secondary | ICD-10-CM | POA: Diagnosis not present

## 2022-08-18 DIAGNOSIS — D591 Autoimmune hemolytic anemia, unspecified: Secondary | ICD-10-CM | POA: Diagnosis not present

## 2022-08-18 DIAGNOSIS — G4089 Other seizures: Secondary | ICD-10-CM | POA: Diagnosis not present

## 2022-08-18 DIAGNOSIS — K219 Gastro-esophageal reflux disease without esophagitis: Secondary | ICD-10-CM | POA: Diagnosis not present

## 2022-08-18 DIAGNOSIS — I5030 Unspecified diastolic (congestive) heart failure: Secondary | ICD-10-CM | POA: Diagnosis not present

## 2022-08-18 DIAGNOSIS — K5790 Diverticulosis of intestine, part unspecified, without perforation or abscess without bleeding: Secondary | ICD-10-CM | POA: Diagnosis not present

## 2022-08-18 DIAGNOSIS — G629 Polyneuropathy, unspecified: Secondary | ICD-10-CM | POA: Diagnosis not present

## 2022-08-19 DIAGNOSIS — B952 Enterococcus as the cause of diseases classified elsewhere: Secondary | ICD-10-CM | POA: Diagnosis not present

## 2022-08-19 DIAGNOSIS — D591 Autoimmune hemolytic anemia, unspecified: Secondary | ICD-10-CM | POA: Diagnosis not present

## 2022-08-19 DIAGNOSIS — I5030 Unspecified diastolic (congestive) heart failure: Secondary | ICD-10-CM | POA: Diagnosis not present

## 2022-08-19 DIAGNOSIS — Z87440 Personal history of urinary (tract) infections: Secondary | ICD-10-CM | POA: Diagnosis not present

## 2022-08-19 DIAGNOSIS — K219 Gastro-esophageal reflux disease without esophagitis: Secondary | ICD-10-CM | POA: Diagnosis not present

## 2022-08-19 DIAGNOSIS — G629 Polyneuropathy, unspecified: Secondary | ICD-10-CM | POA: Diagnosis not present

## 2022-08-19 DIAGNOSIS — M069 Rheumatoid arthritis, unspecified: Secondary | ICD-10-CM | POA: Diagnosis not present

## 2022-08-19 DIAGNOSIS — G4089 Other seizures: Secondary | ICD-10-CM | POA: Diagnosis not present

## 2022-08-19 DIAGNOSIS — Z1621 Resistance to vancomycin: Secondary | ICD-10-CM | POA: Diagnosis not present

## 2022-08-19 DIAGNOSIS — M1990 Unspecified osteoarthritis, unspecified site: Secondary | ICD-10-CM | POA: Diagnosis not present

## 2022-08-19 DIAGNOSIS — K5790 Diverticulosis of intestine, part unspecified, without perforation or abscess without bleeding: Secondary | ICD-10-CM | POA: Diagnosis not present

## 2022-08-19 DIAGNOSIS — K449 Diaphragmatic hernia without obstruction or gangrene: Secondary | ICD-10-CM | POA: Diagnosis not present

## 2022-08-22 DIAGNOSIS — I5030 Unspecified diastolic (congestive) heart failure: Secondary | ICD-10-CM | POA: Diagnosis not present

## 2022-08-22 DIAGNOSIS — K5790 Diverticulosis of intestine, part unspecified, without perforation or abscess without bleeding: Secondary | ICD-10-CM | POA: Diagnosis not present

## 2022-08-22 DIAGNOSIS — K219 Gastro-esophageal reflux disease without esophagitis: Secondary | ICD-10-CM | POA: Diagnosis not present

## 2022-08-22 DIAGNOSIS — G4089 Other seizures: Secondary | ICD-10-CM | POA: Diagnosis not present

## 2022-08-22 DIAGNOSIS — D591 Autoimmune hemolytic anemia, unspecified: Secondary | ICD-10-CM | POA: Diagnosis not present

## 2022-08-22 DIAGNOSIS — G629 Polyneuropathy, unspecified: Secondary | ICD-10-CM | POA: Diagnosis not present

## 2022-08-23 DIAGNOSIS — K5790 Diverticulosis of intestine, part unspecified, without perforation or abscess without bleeding: Secondary | ICD-10-CM | POA: Diagnosis not present

## 2022-08-23 DIAGNOSIS — I5030 Unspecified diastolic (congestive) heart failure: Secondary | ICD-10-CM | POA: Diagnosis not present

## 2022-08-23 DIAGNOSIS — D591 Autoimmune hemolytic anemia, unspecified: Secondary | ICD-10-CM | POA: Diagnosis not present

## 2022-08-23 DIAGNOSIS — K219 Gastro-esophageal reflux disease without esophagitis: Secondary | ICD-10-CM | POA: Diagnosis not present

## 2022-08-23 DIAGNOSIS — G629 Polyneuropathy, unspecified: Secondary | ICD-10-CM | POA: Diagnosis not present

## 2022-08-23 DIAGNOSIS — G4089 Other seizures: Secondary | ICD-10-CM | POA: Diagnosis not present

## 2022-08-24 DIAGNOSIS — D591 Autoimmune hemolytic anemia, unspecified: Secondary | ICD-10-CM | POA: Diagnosis not present

## 2022-08-24 DIAGNOSIS — I5030 Unspecified diastolic (congestive) heart failure: Secondary | ICD-10-CM | POA: Diagnosis not present

## 2022-08-24 DIAGNOSIS — G629 Polyneuropathy, unspecified: Secondary | ICD-10-CM | POA: Diagnosis not present

## 2022-08-24 DIAGNOSIS — K5790 Diverticulosis of intestine, part unspecified, without perforation or abscess without bleeding: Secondary | ICD-10-CM | POA: Diagnosis not present

## 2022-08-24 DIAGNOSIS — G4089 Other seizures: Secondary | ICD-10-CM | POA: Diagnosis not present

## 2022-08-24 DIAGNOSIS — K219 Gastro-esophageal reflux disease without esophagitis: Secondary | ICD-10-CM | POA: Diagnosis not present

## 2022-08-26 DIAGNOSIS — G4089 Other seizures: Secondary | ICD-10-CM | POA: Diagnosis not present

## 2022-08-26 DIAGNOSIS — I5030 Unspecified diastolic (congestive) heart failure: Secondary | ICD-10-CM | POA: Diagnosis not present

## 2022-08-26 DIAGNOSIS — K5790 Diverticulosis of intestine, part unspecified, without perforation or abscess without bleeding: Secondary | ICD-10-CM | POA: Diagnosis not present

## 2022-08-26 DIAGNOSIS — D591 Autoimmune hemolytic anemia, unspecified: Secondary | ICD-10-CM | POA: Diagnosis not present

## 2022-08-26 DIAGNOSIS — G629 Polyneuropathy, unspecified: Secondary | ICD-10-CM | POA: Diagnosis not present

## 2022-08-26 DIAGNOSIS — K219 Gastro-esophageal reflux disease without esophagitis: Secondary | ICD-10-CM | POA: Diagnosis not present

## 2022-08-29 DIAGNOSIS — K5790 Diverticulosis of intestine, part unspecified, without perforation or abscess without bleeding: Secondary | ICD-10-CM | POA: Diagnosis not present

## 2022-08-29 DIAGNOSIS — I5030 Unspecified diastolic (congestive) heart failure: Secondary | ICD-10-CM | POA: Diagnosis not present

## 2022-08-29 DIAGNOSIS — G4089 Other seizures: Secondary | ICD-10-CM | POA: Diagnosis not present

## 2022-08-29 DIAGNOSIS — D591 Autoimmune hemolytic anemia, unspecified: Secondary | ICD-10-CM | POA: Diagnosis not present

## 2022-08-29 DIAGNOSIS — G629 Polyneuropathy, unspecified: Secondary | ICD-10-CM | POA: Diagnosis not present

## 2022-08-29 DIAGNOSIS — K219 Gastro-esophageal reflux disease without esophagitis: Secondary | ICD-10-CM | POA: Diagnosis not present

## 2022-08-31 DIAGNOSIS — K5790 Diverticulosis of intestine, part unspecified, without perforation or abscess without bleeding: Secondary | ICD-10-CM | POA: Diagnosis not present

## 2022-08-31 DIAGNOSIS — G629 Polyneuropathy, unspecified: Secondary | ICD-10-CM | POA: Diagnosis not present

## 2022-08-31 DIAGNOSIS — G4089 Other seizures: Secondary | ICD-10-CM | POA: Diagnosis not present

## 2022-08-31 DIAGNOSIS — K219 Gastro-esophageal reflux disease without esophagitis: Secondary | ICD-10-CM | POA: Diagnosis not present

## 2022-08-31 DIAGNOSIS — I5030 Unspecified diastolic (congestive) heart failure: Secondary | ICD-10-CM | POA: Diagnosis not present

## 2022-08-31 DIAGNOSIS — D591 Autoimmune hemolytic anemia, unspecified: Secondary | ICD-10-CM | POA: Diagnosis not present

## 2022-09-02 DIAGNOSIS — G4089 Other seizures: Secondary | ICD-10-CM | POA: Diagnosis not present

## 2022-09-02 DIAGNOSIS — D591 Autoimmune hemolytic anemia, unspecified: Secondary | ICD-10-CM | POA: Diagnosis not present

## 2022-09-02 DIAGNOSIS — K5790 Diverticulosis of intestine, part unspecified, without perforation or abscess without bleeding: Secondary | ICD-10-CM | POA: Diagnosis not present

## 2022-09-02 DIAGNOSIS — G629 Polyneuropathy, unspecified: Secondary | ICD-10-CM | POA: Diagnosis not present

## 2022-09-02 DIAGNOSIS — K219 Gastro-esophageal reflux disease without esophagitis: Secondary | ICD-10-CM | POA: Diagnosis not present

## 2022-09-02 DIAGNOSIS — I5030 Unspecified diastolic (congestive) heart failure: Secondary | ICD-10-CM | POA: Diagnosis not present

## 2022-09-05 DIAGNOSIS — I5030 Unspecified diastolic (congestive) heart failure: Secondary | ICD-10-CM | POA: Diagnosis not present

## 2022-09-05 DIAGNOSIS — K5790 Diverticulosis of intestine, part unspecified, without perforation or abscess without bleeding: Secondary | ICD-10-CM | POA: Diagnosis not present

## 2022-09-05 DIAGNOSIS — D591 Autoimmune hemolytic anemia, unspecified: Secondary | ICD-10-CM | POA: Diagnosis not present

## 2022-09-05 DIAGNOSIS — K219 Gastro-esophageal reflux disease without esophagitis: Secondary | ICD-10-CM | POA: Diagnosis not present

## 2022-09-05 DIAGNOSIS — G629 Polyneuropathy, unspecified: Secondary | ICD-10-CM | POA: Diagnosis not present

## 2022-09-05 DIAGNOSIS — G4089 Other seizures: Secondary | ICD-10-CM | POA: Diagnosis not present

## 2022-09-07 DIAGNOSIS — I5030 Unspecified diastolic (congestive) heart failure: Secondary | ICD-10-CM | POA: Diagnosis not present

## 2022-09-07 DIAGNOSIS — D591 Autoimmune hemolytic anemia, unspecified: Secondary | ICD-10-CM | POA: Diagnosis not present

## 2022-09-07 DIAGNOSIS — G4089 Other seizures: Secondary | ICD-10-CM | POA: Diagnosis not present

## 2022-09-07 DIAGNOSIS — K219 Gastro-esophageal reflux disease without esophagitis: Secondary | ICD-10-CM | POA: Diagnosis not present

## 2022-09-07 DIAGNOSIS — G629 Polyneuropathy, unspecified: Secondary | ICD-10-CM | POA: Diagnosis not present

## 2022-09-07 DIAGNOSIS — K5790 Diverticulosis of intestine, part unspecified, without perforation or abscess without bleeding: Secondary | ICD-10-CM | POA: Diagnosis not present

## 2022-09-09 DIAGNOSIS — G4089 Other seizures: Secondary | ICD-10-CM | POA: Diagnosis not present

## 2022-09-09 DIAGNOSIS — K5790 Diverticulosis of intestine, part unspecified, without perforation or abscess without bleeding: Secondary | ICD-10-CM | POA: Diagnosis not present

## 2022-09-09 DIAGNOSIS — I5030 Unspecified diastolic (congestive) heart failure: Secondary | ICD-10-CM | POA: Diagnosis not present

## 2022-09-09 DIAGNOSIS — D591 Autoimmune hemolytic anemia, unspecified: Secondary | ICD-10-CM | POA: Diagnosis not present

## 2022-09-09 DIAGNOSIS — G629 Polyneuropathy, unspecified: Secondary | ICD-10-CM | POA: Diagnosis not present

## 2022-09-09 DIAGNOSIS — K219 Gastro-esophageal reflux disease without esophagitis: Secondary | ICD-10-CM | POA: Diagnosis not present

## 2022-09-13 DIAGNOSIS — G629 Polyneuropathy, unspecified: Secondary | ICD-10-CM | POA: Diagnosis not present

## 2022-09-13 DIAGNOSIS — G4089 Other seizures: Secondary | ICD-10-CM | POA: Diagnosis not present

## 2022-09-13 DIAGNOSIS — K219 Gastro-esophageal reflux disease without esophagitis: Secondary | ICD-10-CM | POA: Diagnosis not present

## 2022-09-13 DIAGNOSIS — I5030 Unspecified diastolic (congestive) heart failure: Secondary | ICD-10-CM | POA: Diagnosis not present

## 2022-09-13 DIAGNOSIS — K5790 Diverticulosis of intestine, part unspecified, without perforation or abscess without bleeding: Secondary | ICD-10-CM | POA: Diagnosis not present

## 2022-09-13 DIAGNOSIS — D591 Autoimmune hemolytic anemia, unspecified: Secondary | ICD-10-CM | POA: Diagnosis not present

## 2022-09-14 DIAGNOSIS — G629 Polyneuropathy, unspecified: Secondary | ICD-10-CM | POA: Diagnosis not present

## 2022-09-14 DIAGNOSIS — K219 Gastro-esophageal reflux disease without esophagitis: Secondary | ICD-10-CM | POA: Diagnosis not present

## 2022-09-14 DIAGNOSIS — I5030 Unspecified diastolic (congestive) heart failure: Secondary | ICD-10-CM | POA: Diagnosis not present

## 2022-09-14 DIAGNOSIS — G4089 Other seizures: Secondary | ICD-10-CM | POA: Diagnosis not present

## 2022-09-14 DIAGNOSIS — K5790 Diverticulosis of intestine, part unspecified, without perforation or abscess without bleeding: Secondary | ICD-10-CM | POA: Diagnosis not present

## 2022-09-14 DIAGNOSIS — D591 Autoimmune hemolytic anemia, unspecified: Secondary | ICD-10-CM | POA: Diagnosis not present

## 2022-09-16 DIAGNOSIS — G4089 Other seizures: Secondary | ICD-10-CM | POA: Diagnosis not present

## 2022-09-16 DIAGNOSIS — I5030 Unspecified diastolic (congestive) heart failure: Secondary | ICD-10-CM | POA: Diagnosis not present

## 2022-09-16 DIAGNOSIS — K5790 Diverticulosis of intestine, part unspecified, without perforation or abscess without bleeding: Secondary | ICD-10-CM | POA: Diagnosis not present

## 2022-09-16 DIAGNOSIS — D591 Autoimmune hemolytic anemia, unspecified: Secondary | ICD-10-CM | POA: Diagnosis not present

## 2022-09-16 DIAGNOSIS — G629 Polyneuropathy, unspecified: Secondary | ICD-10-CM | POA: Diagnosis not present

## 2022-09-16 DIAGNOSIS — K219 Gastro-esophageal reflux disease without esophagitis: Secondary | ICD-10-CM | POA: Diagnosis not present

## 2022-09-18 DIAGNOSIS — M1990 Unspecified osteoarthritis, unspecified site: Secondary | ICD-10-CM | POA: Diagnosis not present

## 2022-09-18 DIAGNOSIS — K449 Diaphragmatic hernia without obstruction or gangrene: Secondary | ICD-10-CM | POA: Diagnosis not present

## 2022-09-18 DIAGNOSIS — G4089 Other seizures: Secondary | ICD-10-CM | POA: Diagnosis not present

## 2022-09-18 DIAGNOSIS — Z1621 Resistance to vancomycin: Secondary | ICD-10-CM | POA: Diagnosis not present

## 2022-09-18 DIAGNOSIS — G629 Polyneuropathy, unspecified: Secondary | ICD-10-CM | POA: Diagnosis not present

## 2022-09-18 DIAGNOSIS — K219 Gastro-esophageal reflux disease without esophagitis: Secondary | ICD-10-CM | POA: Diagnosis not present

## 2022-09-18 DIAGNOSIS — B952 Enterococcus as the cause of diseases classified elsewhere: Secondary | ICD-10-CM | POA: Diagnosis not present

## 2022-09-18 DIAGNOSIS — M069 Rheumatoid arthritis, unspecified: Secondary | ICD-10-CM | POA: Diagnosis not present

## 2022-09-18 DIAGNOSIS — Z87440 Personal history of urinary (tract) infections: Secondary | ICD-10-CM | POA: Diagnosis not present

## 2022-09-18 DIAGNOSIS — I5030 Unspecified diastolic (congestive) heart failure: Secondary | ICD-10-CM | POA: Diagnosis not present

## 2022-09-18 DIAGNOSIS — K5790 Diverticulosis of intestine, part unspecified, without perforation or abscess without bleeding: Secondary | ICD-10-CM | POA: Diagnosis not present

## 2022-09-18 DIAGNOSIS — D591 Autoimmune hemolytic anemia, unspecified: Secondary | ICD-10-CM | POA: Diagnosis not present

## 2022-09-19 DIAGNOSIS — I5030 Unspecified diastolic (congestive) heart failure: Secondary | ICD-10-CM | POA: Diagnosis not present

## 2022-09-19 DIAGNOSIS — G629 Polyneuropathy, unspecified: Secondary | ICD-10-CM | POA: Diagnosis not present

## 2022-09-19 DIAGNOSIS — K5790 Diverticulosis of intestine, part unspecified, without perforation or abscess without bleeding: Secondary | ICD-10-CM | POA: Diagnosis not present

## 2022-09-19 DIAGNOSIS — K219 Gastro-esophageal reflux disease without esophagitis: Secondary | ICD-10-CM | POA: Diagnosis not present

## 2022-09-19 DIAGNOSIS — G4089 Other seizures: Secondary | ICD-10-CM | POA: Diagnosis not present

## 2022-09-19 DIAGNOSIS — D591 Autoimmune hemolytic anemia, unspecified: Secondary | ICD-10-CM | POA: Diagnosis not present

## 2022-09-20 DIAGNOSIS — G4089 Other seizures: Secondary | ICD-10-CM | POA: Diagnosis not present

## 2022-09-20 DIAGNOSIS — G629 Polyneuropathy, unspecified: Secondary | ICD-10-CM | POA: Diagnosis not present

## 2022-09-20 DIAGNOSIS — D591 Autoimmune hemolytic anemia, unspecified: Secondary | ICD-10-CM | POA: Diagnosis not present

## 2022-09-20 DIAGNOSIS — I5030 Unspecified diastolic (congestive) heart failure: Secondary | ICD-10-CM | POA: Diagnosis not present

## 2022-09-20 DIAGNOSIS — K5790 Diverticulosis of intestine, part unspecified, without perforation or abscess without bleeding: Secondary | ICD-10-CM | POA: Diagnosis not present

## 2022-09-20 DIAGNOSIS — K219 Gastro-esophageal reflux disease without esophagitis: Secondary | ICD-10-CM | POA: Diagnosis not present

## 2022-09-21 DIAGNOSIS — G629 Polyneuropathy, unspecified: Secondary | ICD-10-CM | POA: Diagnosis not present

## 2022-09-21 DIAGNOSIS — K219 Gastro-esophageal reflux disease without esophagitis: Secondary | ICD-10-CM | POA: Diagnosis not present

## 2022-09-21 DIAGNOSIS — G4089 Other seizures: Secondary | ICD-10-CM | POA: Diagnosis not present

## 2022-09-21 DIAGNOSIS — D591 Autoimmune hemolytic anemia, unspecified: Secondary | ICD-10-CM | POA: Diagnosis not present

## 2022-09-21 DIAGNOSIS — K5790 Diverticulosis of intestine, part unspecified, without perforation or abscess without bleeding: Secondary | ICD-10-CM | POA: Diagnosis not present

## 2022-09-21 DIAGNOSIS — I5030 Unspecified diastolic (congestive) heart failure: Secondary | ICD-10-CM | POA: Diagnosis not present

## 2022-09-23 DIAGNOSIS — D591 Autoimmune hemolytic anemia, unspecified: Secondary | ICD-10-CM | POA: Diagnosis not present

## 2022-09-23 DIAGNOSIS — K219 Gastro-esophageal reflux disease without esophagitis: Secondary | ICD-10-CM | POA: Diagnosis not present

## 2022-09-23 DIAGNOSIS — G629 Polyneuropathy, unspecified: Secondary | ICD-10-CM | POA: Diagnosis not present

## 2022-09-23 DIAGNOSIS — K5790 Diverticulosis of intestine, part unspecified, without perforation or abscess without bleeding: Secondary | ICD-10-CM | POA: Diagnosis not present

## 2022-09-23 DIAGNOSIS — I5030 Unspecified diastolic (congestive) heart failure: Secondary | ICD-10-CM | POA: Diagnosis not present

## 2022-09-23 DIAGNOSIS — G4089 Other seizures: Secondary | ICD-10-CM | POA: Diagnosis not present

## 2022-09-25 DIAGNOSIS — I5030 Unspecified diastolic (congestive) heart failure: Secondary | ICD-10-CM | POA: Diagnosis not present

## 2022-09-25 DIAGNOSIS — G629 Polyneuropathy, unspecified: Secondary | ICD-10-CM | POA: Diagnosis not present

## 2022-09-25 DIAGNOSIS — G4089 Other seizures: Secondary | ICD-10-CM | POA: Diagnosis not present

## 2022-09-25 DIAGNOSIS — K219 Gastro-esophageal reflux disease without esophagitis: Secondary | ICD-10-CM | POA: Diagnosis not present

## 2022-09-25 DIAGNOSIS — D591 Autoimmune hemolytic anemia, unspecified: Secondary | ICD-10-CM | POA: Diagnosis not present

## 2022-09-25 DIAGNOSIS — K5790 Diverticulosis of intestine, part unspecified, without perforation or abscess without bleeding: Secondary | ICD-10-CM | POA: Diagnosis not present

## 2022-09-27 DIAGNOSIS — D591 Autoimmune hemolytic anemia, unspecified: Secondary | ICD-10-CM | POA: Diagnosis not present

## 2022-09-27 DIAGNOSIS — K5790 Diverticulosis of intestine, part unspecified, without perforation or abscess without bleeding: Secondary | ICD-10-CM | POA: Diagnosis not present

## 2022-09-27 DIAGNOSIS — G629 Polyneuropathy, unspecified: Secondary | ICD-10-CM | POA: Diagnosis not present

## 2022-09-27 DIAGNOSIS — K219 Gastro-esophageal reflux disease without esophagitis: Secondary | ICD-10-CM | POA: Diagnosis not present

## 2022-09-27 DIAGNOSIS — G4089 Other seizures: Secondary | ICD-10-CM | POA: Diagnosis not present

## 2022-09-27 DIAGNOSIS — I5030 Unspecified diastolic (congestive) heart failure: Secondary | ICD-10-CM | POA: Diagnosis not present

## 2022-09-28 DIAGNOSIS — G4089 Other seizures: Secondary | ICD-10-CM | POA: Diagnosis not present

## 2022-09-28 DIAGNOSIS — K5790 Diverticulosis of intestine, part unspecified, without perforation or abscess without bleeding: Secondary | ICD-10-CM | POA: Diagnosis not present

## 2022-09-28 DIAGNOSIS — G629 Polyneuropathy, unspecified: Secondary | ICD-10-CM | POA: Diagnosis not present

## 2022-09-28 DIAGNOSIS — D591 Autoimmune hemolytic anemia, unspecified: Secondary | ICD-10-CM | POA: Diagnosis not present

## 2022-09-28 DIAGNOSIS — I5030 Unspecified diastolic (congestive) heart failure: Secondary | ICD-10-CM | POA: Diagnosis not present

## 2022-09-28 DIAGNOSIS — K219 Gastro-esophageal reflux disease without esophagitis: Secondary | ICD-10-CM | POA: Diagnosis not present

## 2022-09-30 DIAGNOSIS — G4089 Other seizures: Secondary | ICD-10-CM | POA: Diagnosis not present

## 2022-09-30 DIAGNOSIS — G629 Polyneuropathy, unspecified: Secondary | ICD-10-CM | POA: Diagnosis not present

## 2022-09-30 DIAGNOSIS — K5790 Diverticulosis of intestine, part unspecified, without perforation or abscess without bleeding: Secondary | ICD-10-CM | POA: Diagnosis not present

## 2022-09-30 DIAGNOSIS — D591 Autoimmune hemolytic anemia, unspecified: Secondary | ICD-10-CM | POA: Diagnosis not present

## 2022-09-30 DIAGNOSIS — I5030 Unspecified diastolic (congestive) heart failure: Secondary | ICD-10-CM | POA: Diagnosis not present

## 2022-09-30 DIAGNOSIS — K219 Gastro-esophageal reflux disease without esophagitis: Secondary | ICD-10-CM | POA: Diagnosis not present

## 2022-10-03 DIAGNOSIS — D591 Autoimmune hemolytic anemia, unspecified: Secondary | ICD-10-CM | POA: Diagnosis not present

## 2022-10-03 DIAGNOSIS — I5030 Unspecified diastolic (congestive) heart failure: Secondary | ICD-10-CM | POA: Diagnosis not present

## 2022-10-03 DIAGNOSIS — G629 Polyneuropathy, unspecified: Secondary | ICD-10-CM | POA: Diagnosis not present

## 2022-10-03 DIAGNOSIS — G4089 Other seizures: Secondary | ICD-10-CM | POA: Diagnosis not present

## 2022-10-03 DIAGNOSIS — K219 Gastro-esophageal reflux disease without esophagitis: Secondary | ICD-10-CM | POA: Diagnosis not present

## 2022-10-03 DIAGNOSIS — K5790 Diverticulosis of intestine, part unspecified, without perforation or abscess without bleeding: Secondary | ICD-10-CM | POA: Diagnosis not present

## 2022-10-05 DIAGNOSIS — G629 Polyneuropathy, unspecified: Secondary | ICD-10-CM | POA: Diagnosis not present

## 2022-10-05 DIAGNOSIS — K219 Gastro-esophageal reflux disease without esophagitis: Secondary | ICD-10-CM | POA: Diagnosis not present

## 2022-10-05 DIAGNOSIS — G4089 Other seizures: Secondary | ICD-10-CM | POA: Diagnosis not present

## 2022-10-05 DIAGNOSIS — I5030 Unspecified diastolic (congestive) heart failure: Secondary | ICD-10-CM | POA: Diagnosis not present

## 2022-10-05 DIAGNOSIS — D591 Autoimmune hemolytic anemia, unspecified: Secondary | ICD-10-CM | POA: Diagnosis not present

## 2022-10-05 DIAGNOSIS — K5790 Diverticulosis of intestine, part unspecified, without perforation or abscess without bleeding: Secondary | ICD-10-CM | POA: Diagnosis not present

## 2022-10-07 DIAGNOSIS — G629 Polyneuropathy, unspecified: Secondary | ICD-10-CM | POA: Diagnosis not present

## 2022-10-07 DIAGNOSIS — G4089 Other seizures: Secondary | ICD-10-CM | POA: Diagnosis not present

## 2022-10-07 DIAGNOSIS — K5790 Diverticulosis of intestine, part unspecified, without perforation or abscess without bleeding: Secondary | ICD-10-CM | POA: Diagnosis not present

## 2022-10-07 DIAGNOSIS — D591 Autoimmune hemolytic anemia, unspecified: Secondary | ICD-10-CM | POA: Diagnosis not present

## 2022-10-07 DIAGNOSIS — I5030 Unspecified diastolic (congestive) heart failure: Secondary | ICD-10-CM | POA: Diagnosis not present

## 2022-10-07 DIAGNOSIS — K219 Gastro-esophageal reflux disease without esophagitis: Secondary | ICD-10-CM | POA: Diagnosis not present

## 2022-10-10 DIAGNOSIS — G4089 Other seizures: Secondary | ICD-10-CM | POA: Diagnosis not present

## 2022-10-10 DIAGNOSIS — K5790 Diverticulosis of intestine, part unspecified, without perforation or abscess without bleeding: Secondary | ICD-10-CM | POA: Diagnosis not present

## 2022-10-10 DIAGNOSIS — G629 Polyneuropathy, unspecified: Secondary | ICD-10-CM | POA: Diagnosis not present

## 2022-10-10 DIAGNOSIS — I5030 Unspecified diastolic (congestive) heart failure: Secondary | ICD-10-CM | POA: Diagnosis not present

## 2022-10-10 DIAGNOSIS — K219 Gastro-esophageal reflux disease without esophagitis: Secondary | ICD-10-CM | POA: Diagnosis not present

## 2022-10-10 DIAGNOSIS — D591 Autoimmune hemolytic anemia, unspecified: Secondary | ICD-10-CM | POA: Diagnosis not present

## 2022-10-12 DIAGNOSIS — G4089 Other seizures: Secondary | ICD-10-CM | POA: Diagnosis not present

## 2022-10-12 DIAGNOSIS — I5030 Unspecified diastolic (congestive) heart failure: Secondary | ICD-10-CM | POA: Diagnosis not present

## 2022-10-12 DIAGNOSIS — D591 Autoimmune hemolytic anemia, unspecified: Secondary | ICD-10-CM | POA: Diagnosis not present

## 2022-10-12 DIAGNOSIS — G629 Polyneuropathy, unspecified: Secondary | ICD-10-CM | POA: Diagnosis not present

## 2022-10-12 DIAGNOSIS — K5790 Diverticulosis of intestine, part unspecified, without perforation or abscess without bleeding: Secondary | ICD-10-CM | POA: Diagnosis not present

## 2022-10-12 DIAGNOSIS — K219 Gastro-esophageal reflux disease without esophagitis: Secondary | ICD-10-CM | POA: Diagnosis not present

## 2022-10-14 DIAGNOSIS — D591 Autoimmune hemolytic anemia, unspecified: Secondary | ICD-10-CM | POA: Diagnosis not present

## 2022-10-14 DIAGNOSIS — G4089 Other seizures: Secondary | ICD-10-CM | POA: Diagnosis not present

## 2022-10-14 DIAGNOSIS — G629 Polyneuropathy, unspecified: Secondary | ICD-10-CM | POA: Diagnosis not present

## 2022-10-14 DIAGNOSIS — I5030 Unspecified diastolic (congestive) heart failure: Secondary | ICD-10-CM | POA: Diagnosis not present

## 2022-10-14 DIAGNOSIS — K219 Gastro-esophageal reflux disease without esophagitis: Secondary | ICD-10-CM | POA: Diagnosis not present

## 2022-10-14 DIAGNOSIS — K5790 Diverticulosis of intestine, part unspecified, without perforation or abscess without bleeding: Secondary | ICD-10-CM | POA: Diagnosis not present

## 2022-10-18 DIAGNOSIS — K5790 Diverticulosis of intestine, part unspecified, without perforation or abscess without bleeding: Secondary | ICD-10-CM | POA: Diagnosis not present

## 2022-10-18 DIAGNOSIS — G629 Polyneuropathy, unspecified: Secondary | ICD-10-CM | POA: Diagnosis not present

## 2022-10-18 DIAGNOSIS — I5030 Unspecified diastolic (congestive) heart failure: Secondary | ICD-10-CM | POA: Diagnosis not present

## 2022-10-18 DIAGNOSIS — G4089 Other seizures: Secondary | ICD-10-CM | POA: Diagnosis not present

## 2022-10-18 DIAGNOSIS — K219 Gastro-esophageal reflux disease without esophagitis: Secondary | ICD-10-CM | POA: Diagnosis not present

## 2022-10-18 DIAGNOSIS — D591 Autoimmune hemolytic anemia, unspecified: Secondary | ICD-10-CM | POA: Diagnosis not present

## 2022-10-19 DIAGNOSIS — K5790 Diverticulosis of intestine, part unspecified, without perforation or abscess without bleeding: Secondary | ICD-10-CM | POA: Diagnosis not present

## 2022-10-19 DIAGNOSIS — I5030 Unspecified diastolic (congestive) heart failure: Secondary | ICD-10-CM | POA: Diagnosis not present

## 2022-10-19 DIAGNOSIS — D591 Autoimmune hemolytic anemia, unspecified: Secondary | ICD-10-CM | POA: Diagnosis not present

## 2022-10-19 DIAGNOSIS — B952 Enterococcus as the cause of diseases classified elsewhere: Secondary | ICD-10-CM | POA: Diagnosis not present

## 2022-10-19 DIAGNOSIS — M069 Rheumatoid arthritis, unspecified: Secondary | ICD-10-CM | POA: Diagnosis not present

## 2022-10-19 DIAGNOSIS — K449 Diaphragmatic hernia without obstruction or gangrene: Secondary | ICD-10-CM | POA: Diagnosis not present

## 2022-10-19 DIAGNOSIS — K219 Gastro-esophageal reflux disease without esophagitis: Secondary | ICD-10-CM | POA: Diagnosis not present

## 2022-10-19 DIAGNOSIS — M1990 Unspecified osteoarthritis, unspecified site: Secondary | ICD-10-CM | POA: Diagnosis not present

## 2022-10-19 DIAGNOSIS — Z1621 Resistance to vancomycin: Secondary | ICD-10-CM | POA: Diagnosis not present

## 2022-10-19 DIAGNOSIS — Z87440 Personal history of urinary (tract) infections: Secondary | ICD-10-CM | POA: Diagnosis not present

## 2022-10-19 DIAGNOSIS — G629 Polyneuropathy, unspecified: Secondary | ICD-10-CM | POA: Diagnosis not present

## 2022-10-19 DIAGNOSIS — G4089 Other seizures: Secondary | ICD-10-CM | POA: Diagnosis not present

## 2022-10-20 DIAGNOSIS — K219 Gastro-esophageal reflux disease without esophagitis: Secondary | ICD-10-CM | POA: Diagnosis not present

## 2022-10-20 DIAGNOSIS — K5790 Diverticulosis of intestine, part unspecified, without perforation or abscess without bleeding: Secondary | ICD-10-CM | POA: Diagnosis not present

## 2022-10-20 DIAGNOSIS — I5030 Unspecified diastolic (congestive) heart failure: Secondary | ICD-10-CM | POA: Diagnosis not present

## 2022-10-20 DIAGNOSIS — G4089 Other seizures: Secondary | ICD-10-CM | POA: Diagnosis not present

## 2022-10-20 DIAGNOSIS — D591 Autoimmune hemolytic anemia, unspecified: Secondary | ICD-10-CM | POA: Diagnosis not present

## 2022-10-20 DIAGNOSIS — G629 Polyneuropathy, unspecified: Secondary | ICD-10-CM | POA: Diagnosis not present

## 2022-10-21 DIAGNOSIS — K5790 Diverticulosis of intestine, part unspecified, without perforation or abscess without bleeding: Secondary | ICD-10-CM | POA: Diagnosis not present

## 2022-10-21 DIAGNOSIS — G629 Polyneuropathy, unspecified: Secondary | ICD-10-CM | POA: Diagnosis not present

## 2022-10-21 DIAGNOSIS — K219 Gastro-esophageal reflux disease without esophagitis: Secondary | ICD-10-CM | POA: Diagnosis not present

## 2022-10-21 DIAGNOSIS — I5030 Unspecified diastolic (congestive) heart failure: Secondary | ICD-10-CM | POA: Diagnosis not present

## 2022-10-21 DIAGNOSIS — G4089 Other seizures: Secondary | ICD-10-CM | POA: Diagnosis not present

## 2022-10-21 DIAGNOSIS — D591 Autoimmune hemolytic anemia, unspecified: Secondary | ICD-10-CM | POA: Diagnosis not present

## 2022-10-22 DIAGNOSIS — D591 Autoimmune hemolytic anemia, unspecified: Secondary | ICD-10-CM | POA: Diagnosis not present

## 2022-10-22 DIAGNOSIS — K219 Gastro-esophageal reflux disease without esophagitis: Secondary | ICD-10-CM | POA: Diagnosis not present

## 2022-10-22 DIAGNOSIS — I5030 Unspecified diastolic (congestive) heart failure: Secondary | ICD-10-CM | POA: Diagnosis not present

## 2022-10-22 DIAGNOSIS — G4089 Other seizures: Secondary | ICD-10-CM | POA: Diagnosis not present

## 2022-10-22 DIAGNOSIS — K5790 Diverticulosis of intestine, part unspecified, without perforation or abscess without bleeding: Secondary | ICD-10-CM | POA: Diagnosis not present

## 2022-10-22 DIAGNOSIS — G629 Polyneuropathy, unspecified: Secondary | ICD-10-CM | POA: Diagnosis not present

## 2022-10-25 DIAGNOSIS — G4089 Other seizures: Secondary | ICD-10-CM | POA: Diagnosis not present

## 2022-10-25 DIAGNOSIS — G629 Polyneuropathy, unspecified: Secondary | ICD-10-CM | POA: Diagnosis not present

## 2022-10-25 DIAGNOSIS — I5030 Unspecified diastolic (congestive) heart failure: Secondary | ICD-10-CM | POA: Diagnosis not present

## 2022-10-25 DIAGNOSIS — K219 Gastro-esophageal reflux disease without esophagitis: Secondary | ICD-10-CM | POA: Diagnosis not present

## 2022-10-25 DIAGNOSIS — D591 Autoimmune hemolytic anemia, unspecified: Secondary | ICD-10-CM | POA: Diagnosis not present

## 2022-10-25 DIAGNOSIS — K5790 Diverticulosis of intestine, part unspecified, without perforation or abscess without bleeding: Secondary | ICD-10-CM | POA: Diagnosis not present

## 2022-10-26 DIAGNOSIS — I5030 Unspecified diastolic (congestive) heart failure: Secondary | ICD-10-CM | POA: Diagnosis not present

## 2022-10-26 DIAGNOSIS — G629 Polyneuropathy, unspecified: Secondary | ICD-10-CM | POA: Diagnosis not present

## 2022-10-26 DIAGNOSIS — K219 Gastro-esophageal reflux disease without esophagitis: Secondary | ICD-10-CM | POA: Diagnosis not present

## 2022-10-26 DIAGNOSIS — K5790 Diverticulosis of intestine, part unspecified, without perforation or abscess without bleeding: Secondary | ICD-10-CM | POA: Diagnosis not present

## 2022-10-26 DIAGNOSIS — G4089 Other seizures: Secondary | ICD-10-CM | POA: Diagnosis not present

## 2022-10-26 DIAGNOSIS — D591 Autoimmune hemolytic anemia, unspecified: Secondary | ICD-10-CM | POA: Diagnosis not present

## 2022-10-28 DIAGNOSIS — G4089 Other seizures: Secondary | ICD-10-CM | POA: Diagnosis not present

## 2022-10-28 DIAGNOSIS — K219 Gastro-esophageal reflux disease without esophagitis: Secondary | ICD-10-CM | POA: Diagnosis not present

## 2022-10-28 DIAGNOSIS — D591 Autoimmune hemolytic anemia, unspecified: Secondary | ICD-10-CM | POA: Diagnosis not present

## 2022-10-28 DIAGNOSIS — G629 Polyneuropathy, unspecified: Secondary | ICD-10-CM | POA: Diagnosis not present

## 2022-10-28 DIAGNOSIS — I5030 Unspecified diastolic (congestive) heart failure: Secondary | ICD-10-CM | POA: Diagnosis not present

## 2022-10-28 DIAGNOSIS — K5790 Diverticulosis of intestine, part unspecified, without perforation or abscess without bleeding: Secondary | ICD-10-CM | POA: Diagnosis not present

## 2022-11-02 DIAGNOSIS — K5790 Diverticulosis of intestine, part unspecified, without perforation or abscess without bleeding: Secondary | ICD-10-CM | POA: Diagnosis not present

## 2022-11-02 DIAGNOSIS — G629 Polyneuropathy, unspecified: Secondary | ICD-10-CM | POA: Diagnosis not present

## 2022-11-02 DIAGNOSIS — K219 Gastro-esophageal reflux disease without esophagitis: Secondary | ICD-10-CM | POA: Diagnosis not present

## 2022-11-02 DIAGNOSIS — D591 Autoimmune hemolytic anemia, unspecified: Secondary | ICD-10-CM | POA: Diagnosis not present

## 2022-11-02 DIAGNOSIS — I5030 Unspecified diastolic (congestive) heart failure: Secondary | ICD-10-CM | POA: Diagnosis not present

## 2022-11-02 DIAGNOSIS — G4089 Other seizures: Secondary | ICD-10-CM | POA: Diagnosis not present

## 2022-11-04 DIAGNOSIS — K5790 Diverticulosis of intestine, part unspecified, without perforation or abscess without bleeding: Secondary | ICD-10-CM | POA: Diagnosis not present

## 2022-11-04 DIAGNOSIS — G629 Polyneuropathy, unspecified: Secondary | ICD-10-CM | POA: Diagnosis not present

## 2022-11-04 DIAGNOSIS — K219 Gastro-esophageal reflux disease without esophagitis: Secondary | ICD-10-CM | POA: Diagnosis not present

## 2022-11-04 DIAGNOSIS — G4089 Other seizures: Secondary | ICD-10-CM | POA: Diagnosis not present

## 2022-11-04 DIAGNOSIS — D591 Autoimmune hemolytic anemia, unspecified: Secondary | ICD-10-CM | POA: Diagnosis not present

## 2022-11-04 DIAGNOSIS — I5030 Unspecified diastolic (congestive) heart failure: Secondary | ICD-10-CM | POA: Diagnosis not present

## 2022-11-07 DIAGNOSIS — G629 Polyneuropathy, unspecified: Secondary | ICD-10-CM | POA: Diagnosis not present

## 2022-11-07 DIAGNOSIS — K219 Gastro-esophageal reflux disease without esophagitis: Secondary | ICD-10-CM | POA: Diagnosis not present

## 2022-11-07 DIAGNOSIS — I5030 Unspecified diastolic (congestive) heart failure: Secondary | ICD-10-CM | POA: Diagnosis not present

## 2022-11-07 DIAGNOSIS — K5790 Diverticulosis of intestine, part unspecified, without perforation or abscess without bleeding: Secondary | ICD-10-CM | POA: Diagnosis not present

## 2022-11-07 DIAGNOSIS — D591 Autoimmune hemolytic anemia, unspecified: Secondary | ICD-10-CM | POA: Diagnosis not present

## 2022-11-07 DIAGNOSIS — G4089 Other seizures: Secondary | ICD-10-CM | POA: Diagnosis not present

## 2022-11-09 DIAGNOSIS — D591 Autoimmune hemolytic anemia, unspecified: Secondary | ICD-10-CM | POA: Diagnosis not present

## 2022-11-09 DIAGNOSIS — K219 Gastro-esophageal reflux disease without esophagitis: Secondary | ICD-10-CM | POA: Diagnosis not present

## 2022-11-09 DIAGNOSIS — I5030 Unspecified diastolic (congestive) heart failure: Secondary | ICD-10-CM | POA: Diagnosis not present

## 2022-11-09 DIAGNOSIS — K5790 Diverticulosis of intestine, part unspecified, without perforation or abscess without bleeding: Secondary | ICD-10-CM | POA: Diagnosis not present

## 2022-11-09 DIAGNOSIS — G629 Polyneuropathy, unspecified: Secondary | ICD-10-CM | POA: Diagnosis not present

## 2022-11-09 DIAGNOSIS — G4089 Other seizures: Secondary | ICD-10-CM | POA: Diagnosis not present

## 2022-11-11 DIAGNOSIS — G4089 Other seizures: Secondary | ICD-10-CM | POA: Diagnosis not present

## 2022-11-11 DIAGNOSIS — G629 Polyneuropathy, unspecified: Secondary | ICD-10-CM | POA: Diagnosis not present

## 2022-11-11 DIAGNOSIS — K5790 Diverticulosis of intestine, part unspecified, without perforation or abscess without bleeding: Secondary | ICD-10-CM | POA: Diagnosis not present

## 2022-11-11 DIAGNOSIS — K219 Gastro-esophageal reflux disease without esophagitis: Secondary | ICD-10-CM | POA: Diagnosis not present

## 2022-11-11 DIAGNOSIS — I5030 Unspecified diastolic (congestive) heart failure: Secondary | ICD-10-CM | POA: Diagnosis not present

## 2022-11-11 DIAGNOSIS — D591 Autoimmune hemolytic anemia, unspecified: Secondary | ICD-10-CM | POA: Diagnosis not present

## 2022-11-16 DIAGNOSIS — G4089 Other seizures: Secondary | ICD-10-CM | POA: Diagnosis not present

## 2022-11-16 DIAGNOSIS — G629 Polyneuropathy, unspecified: Secondary | ICD-10-CM | POA: Diagnosis not present

## 2022-11-16 DIAGNOSIS — K219 Gastro-esophageal reflux disease without esophagitis: Secondary | ICD-10-CM | POA: Diagnosis not present

## 2022-11-16 DIAGNOSIS — I5030 Unspecified diastolic (congestive) heart failure: Secondary | ICD-10-CM | POA: Diagnosis not present

## 2022-11-16 DIAGNOSIS — D591 Autoimmune hemolytic anemia, unspecified: Secondary | ICD-10-CM | POA: Diagnosis not present

## 2022-11-16 DIAGNOSIS — K5790 Diverticulosis of intestine, part unspecified, without perforation or abscess without bleeding: Secondary | ICD-10-CM | POA: Diagnosis not present

## 2022-11-17 DIAGNOSIS — G629 Polyneuropathy, unspecified: Secondary | ICD-10-CM | POA: Diagnosis not present

## 2022-11-17 DIAGNOSIS — G4089 Other seizures: Secondary | ICD-10-CM | POA: Diagnosis not present

## 2022-11-17 DIAGNOSIS — K5790 Diverticulosis of intestine, part unspecified, without perforation or abscess without bleeding: Secondary | ICD-10-CM | POA: Diagnosis not present

## 2022-11-17 DIAGNOSIS — K219 Gastro-esophageal reflux disease without esophagitis: Secondary | ICD-10-CM | POA: Diagnosis not present

## 2022-11-17 DIAGNOSIS — I5030 Unspecified diastolic (congestive) heart failure: Secondary | ICD-10-CM | POA: Diagnosis not present

## 2022-11-17 DIAGNOSIS — D591 Autoimmune hemolytic anemia, unspecified: Secondary | ICD-10-CM | POA: Diagnosis not present

## 2022-11-18 DIAGNOSIS — M1990 Unspecified osteoarthritis, unspecified site: Secondary | ICD-10-CM | POA: Diagnosis not present

## 2022-11-18 DIAGNOSIS — K449 Diaphragmatic hernia without obstruction or gangrene: Secondary | ICD-10-CM | POA: Diagnosis not present

## 2022-11-18 DIAGNOSIS — B952 Enterococcus as the cause of diseases classified elsewhere: Secondary | ICD-10-CM | POA: Diagnosis not present

## 2022-11-18 DIAGNOSIS — M069 Rheumatoid arthritis, unspecified: Secondary | ICD-10-CM | POA: Diagnosis not present

## 2022-11-18 DIAGNOSIS — I5030 Unspecified diastolic (congestive) heart failure: Secondary | ICD-10-CM | POA: Diagnosis not present

## 2022-11-18 DIAGNOSIS — Z87440 Personal history of urinary (tract) infections: Secondary | ICD-10-CM | POA: Diagnosis not present

## 2022-11-18 DIAGNOSIS — D591 Autoimmune hemolytic anemia, unspecified: Secondary | ICD-10-CM | POA: Diagnosis not present

## 2022-11-18 DIAGNOSIS — K219 Gastro-esophageal reflux disease without esophagitis: Secondary | ICD-10-CM | POA: Diagnosis not present

## 2022-11-18 DIAGNOSIS — Z1621 Resistance to vancomycin: Secondary | ICD-10-CM | POA: Diagnosis not present

## 2022-11-18 DIAGNOSIS — G629 Polyneuropathy, unspecified: Secondary | ICD-10-CM | POA: Diagnosis not present

## 2022-11-18 DIAGNOSIS — G4089 Other seizures: Secondary | ICD-10-CM | POA: Diagnosis not present

## 2022-11-18 DIAGNOSIS — K5790 Diverticulosis of intestine, part unspecified, without perforation or abscess without bleeding: Secondary | ICD-10-CM | POA: Diagnosis not present

## 2022-11-22 DIAGNOSIS — D591 Autoimmune hemolytic anemia, unspecified: Secondary | ICD-10-CM | POA: Diagnosis not present

## 2022-11-22 DIAGNOSIS — G629 Polyneuropathy, unspecified: Secondary | ICD-10-CM | POA: Diagnosis not present

## 2022-11-22 DIAGNOSIS — I5030 Unspecified diastolic (congestive) heart failure: Secondary | ICD-10-CM | POA: Diagnosis not present

## 2022-11-22 DIAGNOSIS — G4089 Other seizures: Secondary | ICD-10-CM | POA: Diagnosis not present

## 2022-11-22 DIAGNOSIS — K219 Gastro-esophageal reflux disease without esophagitis: Secondary | ICD-10-CM | POA: Diagnosis not present

## 2022-11-22 DIAGNOSIS — K5790 Diverticulosis of intestine, part unspecified, without perforation or abscess without bleeding: Secondary | ICD-10-CM | POA: Diagnosis not present

## 2022-11-23 DIAGNOSIS — K5790 Diverticulosis of intestine, part unspecified, without perforation or abscess without bleeding: Secondary | ICD-10-CM | POA: Diagnosis not present

## 2022-11-23 DIAGNOSIS — D591 Autoimmune hemolytic anemia, unspecified: Secondary | ICD-10-CM | POA: Diagnosis not present

## 2022-11-23 DIAGNOSIS — G629 Polyneuropathy, unspecified: Secondary | ICD-10-CM | POA: Diagnosis not present

## 2022-11-23 DIAGNOSIS — I5030 Unspecified diastolic (congestive) heart failure: Secondary | ICD-10-CM | POA: Diagnosis not present

## 2022-11-23 DIAGNOSIS — K219 Gastro-esophageal reflux disease without esophagitis: Secondary | ICD-10-CM | POA: Diagnosis not present

## 2022-11-23 DIAGNOSIS — G4089 Other seizures: Secondary | ICD-10-CM | POA: Diagnosis not present

## 2022-11-24 DIAGNOSIS — I5030 Unspecified diastolic (congestive) heart failure: Secondary | ICD-10-CM | POA: Diagnosis not present

## 2022-11-24 DIAGNOSIS — K219 Gastro-esophageal reflux disease without esophagitis: Secondary | ICD-10-CM | POA: Diagnosis not present

## 2022-11-24 DIAGNOSIS — D591 Autoimmune hemolytic anemia, unspecified: Secondary | ICD-10-CM | POA: Diagnosis not present

## 2022-11-24 DIAGNOSIS — K5790 Diverticulosis of intestine, part unspecified, without perforation or abscess without bleeding: Secondary | ICD-10-CM | POA: Diagnosis not present

## 2022-11-24 DIAGNOSIS — G4089 Other seizures: Secondary | ICD-10-CM | POA: Diagnosis not present

## 2022-11-24 DIAGNOSIS — G629 Polyneuropathy, unspecified: Secondary | ICD-10-CM | POA: Diagnosis not present

## 2022-11-25 DIAGNOSIS — G4089 Other seizures: Secondary | ICD-10-CM | POA: Diagnosis not present

## 2022-11-25 DIAGNOSIS — D591 Autoimmune hemolytic anemia, unspecified: Secondary | ICD-10-CM | POA: Diagnosis not present

## 2022-11-25 DIAGNOSIS — K5790 Diverticulosis of intestine, part unspecified, without perforation or abscess without bleeding: Secondary | ICD-10-CM | POA: Diagnosis not present

## 2022-11-25 DIAGNOSIS — I5030 Unspecified diastolic (congestive) heart failure: Secondary | ICD-10-CM | POA: Diagnosis not present

## 2022-11-25 DIAGNOSIS — K219 Gastro-esophageal reflux disease without esophagitis: Secondary | ICD-10-CM | POA: Diagnosis not present

## 2022-11-25 DIAGNOSIS — G629 Polyneuropathy, unspecified: Secondary | ICD-10-CM | POA: Diagnosis not present

## 2022-11-28 DIAGNOSIS — G629 Polyneuropathy, unspecified: Secondary | ICD-10-CM | POA: Diagnosis not present

## 2022-11-28 DIAGNOSIS — D591 Autoimmune hemolytic anemia, unspecified: Secondary | ICD-10-CM | POA: Diagnosis not present

## 2022-11-28 DIAGNOSIS — I5030 Unspecified diastolic (congestive) heart failure: Secondary | ICD-10-CM | POA: Diagnosis not present

## 2022-11-28 DIAGNOSIS — K219 Gastro-esophageal reflux disease without esophagitis: Secondary | ICD-10-CM | POA: Diagnosis not present

## 2022-11-28 DIAGNOSIS — K5790 Diverticulosis of intestine, part unspecified, without perforation or abscess without bleeding: Secondary | ICD-10-CM | POA: Diagnosis not present

## 2022-11-28 DIAGNOSIS — G4089 Other seizures: Secondary | ICD-10-CM | POA: Diagnosis not present

## 2022-11-28 NOTE — Progress Notes (Incomplete)
History of Present Illness: [name] is a 88 y.o. year old female presenting for follow-up of recurrent urinary tract infections.  Past Medical History:  Diagnosis Date   Arthritis    Chronic diastolic heart failure (HCC)    Diverticulosis of colon (without mention of hemorrhage)    TCS by Dr. Gala Romney   GERD (gastroesophageal reflux disease)    Hemorrhoids, internal    TCS by Dr. Gala Romney   Irritable bowel syndrome    Migraine    Mitral regurgitation    Peripheral polyneuropathy    Confirmed by EMG and nerve conduction velocities    Pulmonary nodule    Seizures ([place]) 2011   Syncope    Neurocardiogenic - positive tilt table test, on [place]   Tubular adenoma    UTI (urinary tract infection)    Recurrent    Past Surgical History:  Procedure Laterality Date   APPENDECTOMY     Back Pain     going to chiropracator for back and hip pain   BACK SURGERY     x2   bladder tack     CATARACT EXTRACTION, BILATERAL  2017   CHOLECYSTECTOMY     COLONOSCOPY  06/16/2005   internal hemorrhoids, L side diverticula - Dr. Gala Romney   COLONOSCOPY N/A 05/02/2013   Dr. Gala Romney- normal rectum, scattered L sided diverticula. bx= tubular adenoma   ESOPHAGOGASTRODUODENOSCOPY  12/21/2006     Dr. Vivi Ferns esophagus/Patulous EG junction, small to moderate sized hiatal hernia,   multiple fundal gland type appearing polyps biopsied, otherwise normal   FOOT SURGERY     cyst removed   TOTAL ABDOMINAL HYSTERECTOMY     TOTAL HIP ARTHROPLASTY Right 01/11/2017   Procedure: RIGHT TOTAL HIP ARTHROPLASTY ANTERIOR APPROACH;  Surgeon: Gaynelle Arabian, MD;  Location: [facility];  Service: Orthopedics;  Laterality: Right;   WRIST SURGERY      Home Medications:  (Not in a hospital admission)   Allergies:  Allergies  Allergen Reactions   Cephalexin Anaphylaxis and Swelling   Doxazosin Mesylate Other (See Comments)    Made patient EXTREMELY sick.    Iodine Hives   Levofloxacin Anaphylaxis and Rash   Codeine  Other (See Comments)    Hallucinations   Indomethacin Other (See Comments)    Severe Headache    Ivp Dye [Iodinated Contrast Media] Hives   Morphine Other (See Comments)    Hallucinations    Penicillamine     Other reaction(s): Other (See Comments)   Sulfamethoxazole-Trimethoprim Other (See Comments)    Unknown    Trimethoprim     Other reaction(s): Other (See Comments)   Penicillins Other (See Comments)    Made patient go into shock Has patient had a PCN reaction causing immediate rash, facial/tongue/throat swelling, SOB or lightheadedness with hypotension: Yes Has patient had a PCN reaction causing severe rash involving mucus membranes or skin necrosis: No Has patient had a PCN reaction that required hospitalization No Has patient had a PCN reaction occurring within the last 10 years: No If all of the above answers are "NO", then may proceed with Cephalosporin use.     Family History  Problem Relation Age of Onset   Stroke Father    Colon cancer Neg Hx     Social History:  reports that she has never smoked. She has never used smokeless tobacco. She reports that she does not drink alcohol and does not use drugs.  ROS: A complete review of systems was performed.  All systems are negative  except for pertinent findings as noted.  Physical Exam:  Vital signs in last 24 hours: '@VSRANGES'$ @ General:  Alert and oriented, No acute distress HEENT: Normocephalic, atraumatic Neck: No JVD or lymphadenopathy Cardiovascular: Regular rate  Lungs: Normal inspiratory/expiratory excursion Abdomen: Soft, nontender, nondistended, no abdominal masses Back: No CVA tenderness Extremities: No edema Neurologic: Grossly intact  I have reviewed prior pt notes  I have reviewed notes from referring/previous physicians  I have reviewed urinalysis results  I have independently reviewed prior imaging  I have reviewed prior urine culture   Impression/Assessment:  ***  Plan:  ***  Lillette Boxer Calix Heinbaugh 11/28/2022, 8:57 AM  Lillette Boxer. Sharaine Delange MD

## 2022-11-29 ENCOUNTER — Ambulatory Visit: Payer: Medicare Other | Admitting: Urology

## 2022-11-29 DIAGNOSIS — N952 Postmenopausal atrophic vaginitis: Secondary | ICD-10-CM

## 2022-11-29 DIAGNOSIS — N390 Urinary tract infection, site not specified: Secondary | ICD-10-CM

## 2022-11-30 DIAGNOSIS — K5790 Diverticulosis of intestine, part unspecified, without perforation or abscess without bleeding: Secondary | ICD-10-CM | POA: Diagnosis not present

## 2022-11-30 DIAGNOSIS — K219 Gastro-esophageal reflux disease without esophagitis: Secondary | ICD-10-CM | POA: Diagnosis not present

## 2022-11-30 DIAGNOSIS — G4089 Other seizures: Secondary | ICD-10-CM | POA: Diagnosis not present

## 2022-11-30 DIAGNOSIS — D591 Autoimmune hemolytic anemia, unspecified: Secondary | ICD-10-CM | POA: Diagnosis not present

## 2022-11-30 DIAGNOSIS — G629 Polyneuropathy, unspecified: Secondary | ICD-10-CM | POA: Diagnosis not present

## 2022-11-30 DIAGNOSIS — I5030 Unspecified diastolic (congestive) heart failure: Secondary | ICD-10-CM | POA: Diagnosis not present

## 2022-12-02 DIAGNOSIS — G629 Polyneuropathy, unspecified: Secondary | ICD-10-CM | POA: Diagnosis not present

## 2022-12-02 DIAGNOSIS — K5790 Diverticulosis of intestine, part unspecified, without perforation or abscess without bleeding: Secondary | ICD-10-CM | POA: Diagnosis not present

## 2022-12-02 DIAGNOSIS — G4089 Other seizures: Secondary | ICD-10-CM | POA: Diagnosis not present

## 2022-12-02 DIAGNOSIS — I5030 Unspecified diastolic (congestive) heart failure: Secondary | ICD-10-CM | POA: Diagnosis not present

## 2022-12-02 DIAGNOSIS — D591 Autoimmune hemolytic anemia, unspecified: Secondary | ICD-10-CM | POA: Diagnosis not present

## 2022-12-02 DIAGNOSIS — K219 Gastro-esophageal reflux disease without esophagitis: Secondary | ICD-10-CM | POA: Diagnosis not present

## 2022-12-06 DIAGNOSIS — K219 Gastro-esophageal reflux disease without esophagitis: Secondary | ICD-10-CM | POA: Diagnosis not present

## 2022-12-06 DIAGNOSIS — G4089 Other seizures: Secondary | ICD-10-CM | POA: Diagnosis not present

## 2022-12-06 DIAGNOSIS — G629 Polyneuropathy, unspecified: Secondary | ICD-10-CM | POA: Diagnosis not present

## 2022-12-06 DIAGNOSIS — K5790 Diverticulosis of intestine, part unspecified, without perforation or abscess without bleeding: Secondary | ICD-10-CM | POA: Diagnosis not present

## 2022-12-06 DIAGNOSIS — I5030 Unspecified diastolic (congestive) heart failure: Secondary | ICD-10-CM | POA: Diagnosis not present

## 2022-12-06 DIAGNOSIS — D591 Autoimmune hemolytic anemia, unspecified: Secondary | ICD-10-CM | POA: Diagnosis not present

## 2022-12-07 DIAGNOSIS — K5790 Diverticulosis of intestine, part unspecified, without perforation or abscess without bleeding: Secondary | ICD-10-CM | POA: Diagnosis not present

## 2022-12-07 DIAGNOSIS — I5030 Unspecified diastolic (congestive) heart failure: Secondary | ICD-10-CM | POA: Diagnosis not present

## 2022-12-07 DIAGNOSIS — G629 Polyneuropathy, unspecified: Secondary | ICD-10-CM | POA: Diagnosis not present

## 2022-12-07 DIAGNOSIS — D591 Autoimmune hemolytic anemia, unspecified: Secondary | ICD-10-CM | POA: Diagnosis not present

## 2022-12-07 DIAGNOSIS — K219 Gastro-esophageal reflux disease without esophagitis: Secondary | ICD-10-CM | POA: Diagnosis not present

## 2022-12-07 DIAGNOSIS — G4089 Other seizures: Secondary | ICD-10-CM | POA: Diagnosis not present

## 2022-12-09 DIAGNOSIS — D591 Autoimmune hemolytic anemia, unspecified: Secondary | ICD-10-CM | POA: Diagnosis not present

## 2022-12-09 DIAGNOSIS — G4089 Other seizures: Secondary | ICD-10-CM | POA: Diagnosis not present

## 2022-12-09 DIAGNOSIS — K219 Gastro-esophageal reflux disease without esophagitis: Secondary | ICD-10-CM | POA: Diagnosis not present

## 2022-12-09 DIAGNOSIS — K5790 Diverticulosis of intestine, part unspecified, without perforation or abscess without bleeding: Secondary | ICD-10-CM | POA: Diagnosis not present

## 2022-12-09 DIAGNOSIS — G629 Polyneuropathy, unspecified: Secondary | ICD-10-CM | POA: Diagnosis not present

## 2022-12-09 DIAGNOSIS — I5030 Unspecified diastolic (congestive) heart failure: Secondary | ICD-10-CM | POA: Diagnosis not present

## 2022-12-10 DIAGNOSIS — K219 Gastro-esophageal reflux disease without esophagitis: Secondary | ICD-10-CM | POA: Diagnosis not present

## 2022-12-10 DIAGNOSIS — D591 Autoimmune hemolytic anemia, unspecified: Secondary | ICD-10-CM | POA: Diagnosis not present

## 2022-12-10 DIAGNOSIS — G4089 Other seizures: Secondary | ICD-10-CM | POA: Diagnosis not present

## 2022-12-10 DIAGNOSIS — G629 Polyneuropathy, unspecified: Secondary | ICD-10-CM | POA: Diagnosis not present

## 2022-12-10 DIAGNOSIS — K5790 Diverticulosis of intestine, part unspecified, without perforation or abscess without bleeding: Secondary | ICD-10-CM | POA: Diagnosis not present

## 2022-12-10 DIAGNOSIS — I5030 Unspecified diastolic (congestive) heart failure: Secondary | ICD-10-CM | POA: Diagnosis not present

## 2022-12-14 DIAGNOSIS — K5790 Diverticulosis of intestine, part unspecified, without perforation or abscess without bleeding: Secondary | ICD-10-CM | POA: Diagnosis not present

## 2022-12-14 DIAGNOSIS — G629 Polyneuropathy, unspecified: Secondary | ICD-10-CM | POA: Diagnosis not present

## 2022-12-14 DIAGNOSIS — D591 Autoimmune hemolytic anemia, unspecified: Secondary | ICD-10-CM | POA: Diagnosis not present

## 2022-12-14 DIAGNOSIS — I5030 Unspecified diastolic (congestive) heart failure: Secondary | ICD-10-CM | POA: Diagnosis not present

## 2022-12-14 DIAGNOSIS — K219 Gastro-esophageal reflux disease without esophagitis: Secondary | ICD-10-CM | POA: Diagnosis not present

## 2022-12-14 DIAGNOSIS — G4089 Other seizures: Secondary | ICD-10-CM | POA: Diagnosis not present

## 2022-12-16 DIAGNOSIS — K5790 Diverticulosis of intestine, part unspecified, without perforation or abscess without bleeding: Secondary | ICD-10-CM | POA: Diagnosis not present

## 2022-12-16 DIAGNOSIS — D591 Autoimmune hemolytic anemia, unspecified: Secondary | ICD-10-CM | POA: Diagnosis not present

## 2022-12-16 DIAGNOSIS — G629 Polyneuropathy, unspecified: Secondary | ICD-10-CM | POA: Diagnosis not present

## 2022-12-16 DIAGNOSIS — G4089 Other seizures: Secondary | ICD-10-CM | POA: Diagnosis not present

## 2022-12-16 DIAGNOSIS — I5030 Unspecified diastolic (congestive) heart failure: Secondary | ICD-10-CM | POA: Diagnosis not present

## 2022-12-16 DIAGNOSIS — K219 Gastro-esophageal reflux disease without esophagitis: Secondary | ICD-10-CM | POA: Diagnosis not present

## 2022-12-19 DIAGNOSIS — K219 Gastro-esophageal reflux disease without esophagitis: Secondary | ICD-10-CM | POA: Diagnosis not present

## 2022-12-19 DIAGNOSIS — M1990 Unspecified osteoarthritis, unspecified site: Secondary | ICD-10-CM | POA: Diagnosis not present

## 2022-12-19 DIAGNOSIS — G629 Polyneuropathy, unspecified: Secondary | ICD-10-CM | POA: Diagnosis not present

## 2022-12-19 DIAGNOSIS — M069 Rheumatoid arthritis, unspecified: Secondary | ICD-10-CM | POA: Diagnosis not present

## 2022-12-19 DIAGNOSIS — Z87440 Personal history of urinary (tract) infections: Secondary | ICD-10-CM | POA: Diagnosis not present

## 2022-12-19 DIAGNOSIS — K5790 Diverticulosis of intestine, part unspecified, without perforation or abscess without bleeding: Secondary | ICD-10-CM | POA: Diagnosis not present

## 2022-12-19 DIAGNOSIS — I5030 Unspecified diastolic (congestive) heart failure: Secondary | ICD-10-CM | POA: Diagnosis not present

## 2022-12-19 DIAGNOSIS — B952 Enterococcus as the cause of diseases classified elsewhere: Secondary | ICD-10-CM | POA: Diagnosis not present

## 2022-12-19 DIAGNOSIS — K449 Diaphragmatic hernia without obstruction or gangrene: Secondary | ICD-10-CM | POA: Diagnosis not present

## 2022-12-19 DIAGNOSIS — D591 Autoimmune hemolytic anemia, unspecified: Secondary | ICD-10-CM | POA: Diagnosis not present

## 2022-12-19 DIAGNOSIS — Z1621 Resistance to vancomycin: Secondary | ICD-10-CM | POA: Diagnosis not present

## 2022-12-19 DIAGNOSIS — G4089 Other seizures: Secondary | ICD-10-CM | POA: Diagnosis not present

## 2022-12-21 DIAGNOSIS — G4089 Other seizures: Secondary | ICD-10-CM | POA: Diagnosis not present

## 2022-12-21 DIAGNOSIS — K219 Gastro-esophageal reflux disease without esophagitis: Secondary | ICD-10-CM | POA: Diagnosis not present

## 2022-12-21 DIAGNOSIS — K5790 Diverticulosis of intestine, part unspecified, without perforation or abscess without bleeding: Secondary | ICD-10-CM | POA: Diagnosis not present

## 2022-12-21 DIAGNOSIS — I5030 Unspecified diastolic (congestive) heart failure: Secondary | ICD-10-CM | POA: Diagnosis not present

## 2022-12-21 DIAGNOSIS — G629 Polyneuropathy, unspecified: Secondary | ICD-10-CM | POA: Diagnosis not present

## 2022-12-21 DIAGNOSIS — D591 Autoimmune hemolytic anemia, unspecified: Secondary | ICD-10-CM | POA: Diagnosis not present

## 2022-12-23 DIAGNOSIS — I5030 Unspecified diastolic (congestive) heart failure: Secondary | ICD-10-CM | POA: Diagnosis not present

## 2022-12-23 DIAGNOSIS — K219 Gastro-esophageal reflux disease without esophagitis: Secondary | ICD-10-CM | POA: Diagnosis not present

## 2022-12-23 DIAGNOSIS — G629 Polyneuropathy, unspecified: Secondary | ICD-10-CM | POA: Diagnosis not present

## 2022-12-23 DIAGNOSIS — G4089 Other seizures: Secondary | ICD-10-CM | POA: Diagnosis not present

## 2022-12-23 DIAGNOSIS — D591 Autoimmune hemolytic anemia, unspecified: Secondary | ICD-10-CM | POA: Diagnosis not present

## 2022-12-23 DIAGNOSIS — K5790 Diverticulosis of intestine, part unspecified, without perforation or abscess without bleeding: Secondary | ICD-10-CM | POA: Diagnosis not present

## 2022-12-26 DIAGNOSIS — I5030 Unspecified diastolic (congestive) heart failure: Secondary | ICD-10-CM | POA: Diagnosis not present

## 2022-12-26 DIAGNOSIS — D591 Autoimmune hemolytic anemia, unspecified: Secondary | ICD-10-CM | POA: Diagnosis not present

## 2022-12-26 DIAGNOSIS — G4089 Other seizures: Secondary | ICD-10-CM | POA: Diagnosis not present

## 2022-12-26 DIAGNOSIS — K219 Gastro-esophageal reflux disease without esophagitis: Secondary | ICD-10-CM | POA: Diagnosis not present

## 2022-12-26 DIAGNOSIS — K5790 Diverticulosis of intestine, part unspecified, without perforation or abscess without bleeding: Secondary | ICD-10-CM | POA: Diagnosis not present

## 2022-12-26 DIAGNOSIS — G629 Polyneuropathy, unspecified: Secondary | ICD-10-CM | POA: Diagnosis not present

## 2022-12-27 DIAGNOSIS — I5030 Unspecified diastolic (congestive) heart failure: Secondary | ICD-10-CM | POA: Diagnosis not present

## 2022-12-27 DIAGNOSIS — G4089 Other seizures: Secondary | ICD-10-CM | POA: Diagnosis not present

## 2022-12-27 DIAGNOSIS — G629 Polyneuropathy, unspecified: Secondary | ICD-10-CM | POA: Diagnosis not present

## 2022-12-27 DIAGNOSIS — K219 Gastro-esophageal reflux disease without esophagitis: Secondary | ICD-10-CM | POA: Diagnosis not present

## 2022-12-27 DIAGNOSIS — D591 Autoimmune hemolytic anemia, unspecified: Secondary | ICD-10-CM | POA: Diagnosis not present

## 2022-12-27 DIAGNOSIS — K5790 Diverticulosis of intestine, part unspecified, without perforation or abscess without bleeding: Secondary | ICD-10-CM | POA: Diagnosis not present

## 2022-12-28 DIAGNOSIS — K219 Gastro-esophageal reflux disease without esophagitis: Secondary | ICD-10-CM | POA: Diagnosis not present

## 2022-12-28 DIAGNOSIS — G629 Polyneuropathy, unspecified: Secondary | ICD-10-CM | POA: Diagnosis not present

## 2022-12-28 DIAGNOSIS — D591 Autoimmune hemolytic anemia, unspecified: Secondary | ICD-10-CM | POA: Diagnosis not present

## 2022-12-28 DIAGNOSIS — K5790 Diverticulosis of intestine, part unspecified, without perforation or abscess without bleeding: Secondary | ICD-10-CM | POA: Diagnosis not present

## 2022-12-28 DIAGNOSIS — I5030 Unspecified diastolic (congestive) heart failure: Secondary | ICD-10-CM | POA: Diagnosis not present

## 2022-12-28 DIAGNOSIS — G4089 Other seizures: Secondary | ICD-10-CM | POA: Diagnosis not present

## 2022-12-30 DIAGNOSIS — D591 Autoimmune hemolytic anemia, unspecified: Secondary | ICD-10-CM | POA: Diagnosis not present

## 2022-12-30 DIAGNOSIS — I5030 Unspecified diastolic (congestive) heart failure: Secondary | ICD-10-CM | POA: Diagnosis not present

## 2022-12-30 DIAGNOSIS — G4089 Other seizures: Secondary | ICD-10-CM | POA: Diagnosis not present

## 2022-12-30 DIAGNOSIS — G629 Polyneuropathy, unspecified: Secondary | ICD-10-CM | POA: Diagnosis not present

## 2022-12-30 DIAGNOSIS — K219 Gastro-esophageal reflux disease without esophagitis: Secondary | ICD-10-CM | POA: Diagnosis not present

## 2022-12-30 DIAGNOSIS — K5790 Diverticulosis of intestine, part unspecified, without perforation or abscess without bleeding: Secondary | ICD-10-CM | POA: Diagnosis not present

## 2023-01-02 DIAGNOSIS — G4089 Other seizures: Secondary | ICD-10-CM | POA: Diagnosis not present

## 2023-01-02 DIAGNOSIS — D591 Autoimmune hemolytic anemia, unspecified: Secondary | ICD-10-CM | POA: Diagnosis not present

## 2023-01-02 DIAGNOSIS — K219 Gastro-esophageal reflux disease without esophagitis: Secondary | ICD-10-CM | POA: Diagnosis not present

## 2023-01-02 DIAGNOSIS — G629 Polyneuropathy, unspecified: Secondary | ICD-10-CM | POA: Diagnosis not present

## 2023-01-02 DIAGNOSIS — K5790 Diverticulosis of intestine, part unspecified, without perforation or abscess without bleeding: Secondary | ICD-10-CM | POA: Diagnosis not present

## 2023-01-02 DIAGNOSIS — I5030 Unspecified diastolic (congestive) heart failure: Secondary | ICD-10-CM | POA: Diagnosis not present

## 2023-01-03 DIAGNOSIS — I5030 Unspecified diastolic (congestive) heart failure: Secondary | ICD-10-CM | POA: Diagnosis not present

## 2023-01-03 DIAGNOSIS — G4089 Other seizures: Secondary | ICD-10-CM | POA: Diagnosis not present

## 2023-01-03 DIAGNOSIS — K219 Gastro-esophageal reflux disease without esophagitis: Secondary | ICD-10-CM | POA: Diagnosis not present

## 2023-01-03 DIAGNOSIS — K5790 Diverticulosis of intestine, part unspecified, without perforation or abscess without bleeding: Secondary | ICD-10-CM | POA: Diagnosis not present

## 2023-01-03 DIAGNOSIS — G629 Polyneuropathy, unspecified: Secondary | ICD-10-CM | POA: Diagnosis not present

## 2023-01-03 DIAGNOSIS — D591 Autoimmune hemolytic anemia, unspecified: Secondary | ICD-10-CM | POA: Diagnosis not present

## 2023-01-04 ENCOUNTER — Telehealth: Payer: Self-pay | Admitting: Cardiology

## 2023-01-04 DIAGNOSIS — G629 Polyneuropathy, unspecified: Secondary | ICD-10-CM | POA: Diagnosis not present

## 2023-01-04 DIAGNOSIS — I5030 Unspecified diastolic (congestive) heart failure: Secondary | ICD-10-CM | POA: Diagnosis not present

## 2023-01-04 DIAGNOSIS — G4089 Other seizures: Secondary | ICD-10-CM | POA: Diagnosis not present

## 2023-01-04 DIAGNOSIS — K5790 Diverticulosis of intestine, part unspecified, without perforation or abscess without bleeding: Secondary | ICD-10-CM | POA: Diagnosis not present

## 2023-01-04 DIAGNOSIS — K219 Gastro-esophageal reflux disease without esophagitis: Secondary | ICD-10-CM | POA: Diagnosis not present

## 2023-01-04 DIAGNOSIS — D591 Autoimmune hemolytic anemia, unspecified: Secondary | ICD-10-CM | POA: Diagnosis not present

## 2023-01-04 NOTE — Telephone Encounter (Signed)
Per pt's daughter, she is under Hospice care and is bedridden will delete recall for Dr. Domenic Polite

## 2023-01-06 DIAGNOSIS — I5030 Unspecified diastolic (congestive) heart failure: Secondary | ICD-10-CM | POA: Diagnosis not present

## 2023-01-06 DIAGNOSIS — K5790 Diverticulosis of intestine, part unspecified, without perforation or abscess without bleeding: Secondary | ICD-10-CM | POA: Diagnosis not present

## 2023-01-06 DIAGNOSIS — K219 Gastro-esophageal reflux disease without esophagitis: Secondary | ICD-10-CM | POA: Diagnosis not present

## 2023-01-06 DIAGNOSIS — G4089 Other seizures: Secondary | ICD-10-CM | POA: Diagnosis not present

## 2023-01-06 DIAGNOSIS — D591 Autoimmune hemolytic anemia, unspecified: Secondary | ICD-10-CM | POA: Diagnosis not present

## 2023-01-06 DIAGNOSIS — G629 Polyneuropathy, unspecified: Secondary | ICD-10-CM | POA: Diagnosis not present

## 2023-01-10 DIAGNOSIS — K219 Gastro-esophageal reflux disease without esophagitis: Secondary | ICD-10-CM | POA: Diagnosis not present

## 2023-01-10 DIAGNOSIS — I5030 Unspecified diastolic (congestive) heart failure: Secondary | ICD-10-CM | POA: Diagnosis not present

## 2023-01-10 DIAGNOSIS — G629 Polyneuropathy, unspecified: Secondary | ICD-10-CM | POA: Diagnosis not present

## 2023-01-10 DIAGNOSIS — D591 Autoimmune hemolytic anemia, unspecified: Secondary | ICD-10-CM | POA: Diagnosis not present

## 2023-01-10 DIAGNOSIS — K5790 Diverticulosis of intestine, part unspecified, without perforation or abscess without bleeding: Secondary | ICD-10-CM | POA: Diagnosis not present

## 2023-01-10 DIAGNOSIS — G4089 Other seizures: Secondary | ICD-10-CM | POA: Diagnosis not present

## 2023-01-11 DIAGNOSIS — G629 Polyneuropathy, unspecified: Secondary | ICD-10-CM | POA: Diagnosis not present

## 2023-01-11 DIAGNOSIS — K219 Gastro-esophageal reflux disease without esophagitis: Secondary | ICD-10-CM | POA: Diagnosis not present

## 2023-01-11 DIAGNOSIS — G4089 Other seizures: Secondary | ICD-10-CM | POA: Diagnosis not present

## 2023-01-11 DIAGNOSIS — D591 Autoimmune hemolytic anemia, unspecified: Secondary | ICD-10-CM | POA: Diagnosis not present

## 2023-01-11 DIAGNOSIS — I5030 Unspecified diastolic (congestive) heart failure: Secondary | ICD-10-CM | POA: Diagnosis not present

## 2023-01-11 DIAGNOSIS — K5790 Diverticulosis of intestine, part unspecified, without perforation or abscess without bleeding: Secondary | ICD-10-CM | POA: Diagnosis not present

## 2023-01-13 DIAGNOSIS — I5030 Unspecified diastolic (congestive) heart failure: Secondary | ICD-10-CM | POA: Diagnosis not present

## 2023-01-13 DIAGNOSIS — G629 Polyneuropathy, unspecified: Secondary | ICD-10-CM | POA: Diagnosis not present

## 2023-01-13 DIAGNOSIS — D591 Autoimmune hemolytic anemia, unspecified: Secondary | ICD-10-CM | POA: Diagnosis not present

## 2023-01-13 DIAGNOSIS — K219 Gastro-esophageal reflux disease without esophagitis: Secondary | ICD-10-CM | POA: Diagnosis not present

## 2023-01-13 DIAGNOSIS — G4089 Other seizures: Secondary | ICD-10-CM | POA: Diagnosis not present

## 2023-01-13 DIAGNOSIS — K5790 Diverticulosis of intestine, part unspecified, without perforation or abscess without bleeding: Secondary | ICD-10-CM | POA: Diagnosis not present

## 2023-01-18 DIAGNOSIS — G4089 Other seizures: Secondary | ICD-10-CM | POA: Diagnosis not present

## 2023-01-18 DIAGNOSIS — D591 Autoimmune hemolytic anemia, unspecified: Secondary | ICD-10-CM | POA: Diagnosis not present

## 2023-01-18 DIAGNOSIS — G629 Polyneuropathy, unspecified: Secondary | ICD-10-CM | POA: Diagnosis not present

## 2023-01-18 DIAGNOSIS — K219 Gastro-esophageal reflux disease without esophagitis: Secondary | ICD-10-CM | POA: Diagnosis not present

## 2023-01-18 DIAGNOSIS — K5790 Diverticulosis of intestine, part unspecified, without perforation or abscess without bleeding: Secondary | ICD-10-CM | POA: Diagnosis not present

## 2023-01-18 DIAGNOSIS — I5030 Unspecified diastolic (congestive) heart failure: Secondary | ICD-10-CM | POA: Diagnosis not present

## 2023-01-19 DIAGNOSIS — Z87440 Personal history of urinary (tract) infections: Secondary | ICD-10-CM | POA: Diagnosis not present

## 2023-01-19 DIAGNOSIS — I5030 Unspecified diastolic (congestive) heart failure: Secondary | ICD-10-CM | POA: Diagnosis not present

## 2023-01-19 DIAGNOSIS — M1990 Unspecified osteoarthritis, unspecified site: Secondary | ICD-10-CM | POA: Diagnosis not present

## 2023-01-19 DIAGNOSIS — B952 Enterococcus as the cause of diseases classified elsewhere: Secondary | ICD-10-CM | POA: Diagnosis not present

## 2023-01-19 DIAGNOSIS — M069 Rheumatoid arthritis, unspecified: Secondary | ICD-10-CM | POA: Diagnosis not present

## 2023-01-19 DIAGNOSIS — Z1621 Resistance to vancomycin: Secondary | ICD-10-CM | POA: Diagnosis not present

## 2023-01-19 DIAGNOSIS — G629 Polyneuropathy, unspecified: Secondary | ICD-10-CM | POA: Diagnosis not present

## 2023-01-19 DIAGNOSIS — G4089 Other seizures: Secondary | ICD-10-CM | POA: Diagnosis not present

## 2023-01-19 DIAGNOSIS — K5790 Diverticulosis of intestine, part unspecified, without perforation or abscess without bleeding: Secondary | ICD-10-CM | POA: Diagnosis not present

## 2023-01-19 DIAGNOSIS — K449 Diaphragmatic hernia without obstruction or gangrene: Secondary | ICD-10-CM | POA: Diagnosis not present

## 2023-01-19 DIAGNOSIS — K219 Gastro-esophageal reflux disease without esophagitis: Secondary | ICD-10-CM | POA: Diagnosis not present

## 2023-01-19 DIAGNOSIS — D591 Autoimmune hemolytic anemia, unspecified: Secondary | ICD-10-CM | POA: Diagnosis not present

## 2023-01-20 DIAGNOSIS — G4089 Other seizures: Secondary | ICD-10-CM | POA: Diagnosis not present

## 2023-01-20 DIAGNOSIS — K219 Gastro-esophageal reflux disease without esophagitis: Secondary | ICD-10-CM | POA: Diagnosis not present

## 2023-01-20 DIAGNOSIS — I5030 Unspecified diastolic (congestive) heart failure: Secondary | ICD-10-CM | POA: Diagnosis not present

## 2023-01-20 DIAGNOSIS — D591 Autoimmune hemolytic anemia, unspecified: Secondary | ICD-10-CM | POA: Diagnosis not present

## 2023-01-20 DIAGNOSIS — G629 Polyneuropathy, unspecified: Secondary | ICD-10-CM | POA: Diagnosis not present

## 2023-01-20 DIAGNOSIS — K5790 Diverticulosis of intestine, part unspecified, without perforation or abscess without bleeding: Secondary | ICD-10-CM | POA: Diagnosis not present

## 2023-01-24 DIAGNOSIS — G4089 Other seizures: Secondary | ICD-10-CM | POA: Diagnosis not present

## 2023-01-24 DIAGNOSIS — G629 Polyneuropathy, unspecified: Secondary | ICD-10-CM | POA: Diagnosis not present

## 2023-01-24 DIAGNOSIS — K219 Gastro-esophageal reflux disease without esophagitis: Secondary | ICD-10-CM | POA: Diagnosis not present

## 2023-01-24 DIAGNOSIS — D591 Autoimmune hemolytic anemia, unspecified: Secondary | ICD-10-CM | POA: Diagnosis not present

## 2023-01-24 DIAGNOSIS — I5030 Unspecified diastolic (congestive) heart failure: Secondary | ICD-10-CM | POA: Diagnosis not present

## 2023-01-24 DIAGNOSIS — K5790 Diverticulosis of intestine, part unspecified, without perforation or abscess without bleeding: Secondary | ICD-10-CM | POA: Diagnosis not present

## 2023-01-25 DIAGNOSIS — K5790 Diverticulosis of intestine, part unspecified, without perforation or abscess without bleeding: Secondary | ICD-10-CM | POA: Diagnosis not present

## 2023-01-25 DIAGNOSIS — G629 Polyneuropathy, unspecified: Secondary | ICD-10-CM | POA: Diagnosis not present

## 2023-01-25 DIAGNOSIS — D591 Autoimmune hemolytic anemia, unspecified: Secondary | ICD-10-CM | POA: Diagnosis not present

## 2023-01-25 DIAGNOSIS — G4089 Other seizures: Secondary | ICD-10-CM | POA: Diagnosis not present

## 2023-01-25 DIAGNOSIS — K219 Gastro-esophageal reflux disease without esophagitis: Secondary | ICD-10-CM | POA: Diagnosis not present

## 2023-01-25 DIAGNOSIS — I5030 Unspecified diastolic (congestive) heart failure: Secondary | ICD-10-CM | POA: Diagnosis not present

## 2023-01-27 DIAGNOSIS — D591 Autoimmune hemolytic anemia, unspecified: Secondary | ICD-10-CM | POA: Diagnosis not present

## 2023-01-27 DIAGNOSIS — K5790 Diverticulosis of intestine, part unspecified, without perforation or abscess without bleeding: Secondary | ICD-10-CM | POA: Diagnosis not present

## 2023-01-27 DIAGNOSIS — G4089 Other seizures: Secondary | ICD-10-CM | POA: Diagnosis not present

## 2023-01-27 DIAGNOSIS — G629 Polyneuropathy, unspecified: Secondary | ICD-10-CM | POA: Diagnosis not present

## 2023-01-27 DIAGNOSIS — K219 Gastro-esophageal reflux disease without esophagitis: Secondary | ICD-10-CM | POA: Diagnosis not present

## 2023-01-27 DIAGNOSIS — I5030 Unspecified diastolic (congestive) heart failure: Secondary | ICD-10-CM | POA: Diagnosis not present

## 2023-01-30 DIAGNOSIS — D591 Autoimmune hemolytic anemia, unspecified: Secondary | ICD-10-CM | POA: Diagnosis not present

## 2023-01-30 DIAGNOSIS — I5030 Unspecified diastolic (congestive) heart failure: Secondary | ICD-10-CM | POA: Diagnosis not present

## 2023-01-30 DIAGNOSIS — K5790 Diverticulosis of intestine, part unspecified, without perforation or abscess without bleeding: Secondary | ICD-10-CM | POA: Diagnosis not present

## 2023-01-30 DIAGNOSIS — K219 Gastro-esophageal reflux disease without esophagitis: Secondary | ICD-10-CM | POA: Diagnosis not present

## 2023-01-30 DIAGNOSIS — G629 Polyneuropathy, unspecified: Secondary | ICD-10-CM | POA: Diagnosis not present

## 2023-01-30 DIAGNOSIS — G4089 Other seizures: Secondary | ICD-10-CM | POA: Diagnosis not present

## 2023-02-01 DIAGNOSIS — K5790 Diverticulosis of intestine, part unspecified, without perforation or abscess without bleeding: Secondary | ICD-10-CM | POA: Diagnosis not present

## 2023-02-01 DIAGNOSIS — I5030 Unspecified diastolic (congestive) heart failure: Secondary | ICD-10-CM | POA: Diagnosis not present

## 2023-02-01 DIAGNOSIS — G629 Polyneuropathy, unspecified: Secondary | ICD-10-CM | POA: Diagnosis not present

## 2023-02-01 DIAGNOSIS — K219 Gastro-esophageal reflux disease without esophagitis: Secondary | ICD-10-CM | POA: Diagnosis not present

## 2023-02-01 DIAGNOSIS — D591 Autoimmune hemolytic anemia, unspecified: Secondary | ICD-10-CM | POA: Diagnosis not present

## 2023-02-01 DIAGNOSIS — G4089 Other seizures: Secondary | ICD-10-CM | POA: Diagnosis not present

## 2023-02-03 DIAGNOSIS — G629 Polyneuropathy, unspecified: Secondary | ICD-10-CM | POA: Diagnosis not present

## 2023-02-03 DIAGNOSIS — I5030 Unspecified diastolic (congestive) heart failure: Secondary | ICD-10-CM | POA: Diagnosis not present

## 2023-02-03 DIAGNOSIS — D591 Autoimmune hemolytic anemia, unspecified: Secondary | ICD-10-CM | POA: Diagnosis not present

## 2023-02-03 DIAGNOSIS — K219 Gastro-esophageal reflux disease without esophagitis: Secondary | ICD-10-CM | POA: Diagnosis not present

## 2023-02-03 DIAGNOSIS — G4089 Other seizures: Secondary | ICD-10-CM | POA: Diagnosis not present

## 2023-02-03 DIAGNOSIS — K5790 Diverticulosis of intestine, part unspecified, without perforation or abscess without bleeding: Secondary | ICD-10-CM | POA: Diagnosis not present

## 2023-02-08 DIAGNOSIS — D591 Autoimmune hemolytic anemia, unspecified: Secondary | ICD-10-CM | POA: Diagnosis not present

## 2023-02-08 DIAGNOSIS — K219 Gastro-esophageal reflux disease without esophagitis: Secondary | ICD-10-CM | POA: Diagnosis not present

## 2023-02-08 DIAGNOSIS — G629 Polyneuropathy, unspecified: Secondary | ICD-10-CM | POA: Diagnosis not present

## 2023-02-08 DIAGNOSIS — K5790 Diverticulosis of intestine, part unspecified, without perforation or abscess without bleeding: Secondary | ICD-10-CM | POA: Diagnosis not present

## 2023-02-08 DIAGNOSIS — G4089 Other seizures: Secondary | ICD-10-CM | POA: Diagnosis not present

## 2023-02-08 DIAGNOSIS — I5030 Unspecified diastolic (congestive) heart failure: Secondary | ICD-10-CM | POA: Diagnosis not present

## 2023-02-10 DIAGNOSIS — K5790 Diverticulosis of intestine, part unspecified, without perforation or abscess without bleeding: Secondary | ICD-10-CM | POA: Diagnosis not present

## 2023-02-10 DIAGNOSIS — K219 Gastro-esophageal reflux disease without esophagitis: Secondary | ICD-10-CM | POA: Diagnosis not present

## 2023-02-10 DIAGNOSIS — G629 Polyneuropathy, unspecified: Secondary | ICD-10-CM | POA: Diagnosis not present

## 2023-02-10 DIAGNOSIS — I5030 Unspecified diastolic (congestive) heart failure: Secondary | ICD-10-CM | POA: Diagnosis not present

## 2023-02-10 DIAGNOSIS — G4089 Other seizures: Secondary | ICD-10-CM | POA: Diagnosis not present

## 2023-02-10 DIAGNOSIS — D591 Autoimmune hemolytic anemia, unspecified: Secondary | ICD-10-CM | POA: Diagnosis not present

## 2023-02-15 DIAGNOSIS — K219 Gastro-esophageal reflux disease without esophagitis: Secondary | ICD-10-CM | POA: Diagnosis not present

## 2023-02-15 DIAGNOSIS — D591 Autoimmune hemolytic anemia, unspecified: Secondary | ICD-10-CM | POA: Diagnosis not present

## 2023-02-15 DIAGNOSIS — I5030 Unspecified diastolic (congestive) heart failure: Secondary | ICD-10-CM | POA: Diagnosis not present

## 2023-02-15 DIAGNOSIS — K5790 Diverticulosis of intestine, part unspecified, without perforation or abscess without bleeding: Secondary | ICD-10-CM | POA: Diagnosis not present

## 2023-02-15 DIAGNOSIS — G4089 Other seizures: Secondary | ICD-10-CM | POA: Diagnosis not present

## 2023-02-15 DIAGNOSIS — G629 Polyneuropathy, unspecified: Secondary | ICD-10-CM | POA: Diagnosis not present

## 2023-02-17 DIAGNOSIS — K219 Gastro-esophageal reflux disease without esophagitis: Secondary | ICD-10-CM | POA: Diagnosis not present

## 2023-02-17 DIAGNOSIS — K5790 Diverticulosis of intestine, part unspecified, without perforation or abscess without bleeding: Secondary | ICD-10-CM | POA: Diagnosis not present

## 2023-02-17 DIAGNOSIS — I5030 Unspecified diastolic (congestive) heart failure: Secondary | ICD-10-CM | POA: Diagnosis not present

## 2023-02-17 DIAGNOSIS — G4089 Other seizures: Secondary | ICD-10-CM | POA: Diagnosis not present

## 2023-02-17 DIAGNOSIS — Z1621 Resistance to vancomycin: Secondary | ICD-10-CM | POA: Diagnosis not present

## 2023-02-17 DIAGNOSIS — D591 Autoimmune hemolytic anemia, unspecified: Secondary | ICD-10-CM | POA: Diagnosis not present

## 2023-02-17 DIAGNOSIS — M1990 Unspecified osteoarthritis, unspecified site: Secondary | ICD-10-CM | POA: Diagnosis not present

## 2023-02-17 DIAGNOSIS — Z87440 Personal history of urinary (tract) infections: Secondary | ICD-10-CM | POA: Diagnosis not present

## 2023-02-17 DIAGNOSIS — M069 Rheumatoid arthritis, unspecified: Secondary | ICD-10-CM | POA: Diagnosis not present

## 2023-02-17 DIAGNOSIS — B952 Enterococcus as the cause of diseases classified elsewhere: Secondary | ICD-10-CM | POA: Diagnosis not present

## 2023-02-17 DIAGNOSIS — K449 Diaphragmatic hernia without obstruction or gangrene: Secondary | ICD-10-CM | POA: Diagnosis not present

## 2023-02-17 DIAGNOSIS — G629 Polyneuropathy, unspecified: Secondary | ICD-10-CM | POA: Diagnosis not present

## 2023-02-18 DIAGNOSIS — G4089 Other seizures: Secondary | ICD-10-CM | POA: Diagnosis not present

## 2023-02-18 DIAGNOSIS — K5790 Diverticulosis of intestine, part unspecified, without perforation or abscess without bleeding: Secondary | ICD-10-CM | POA: Diagnosis not present

## 2023-02-18 DIAGNOSIS — K219 Gastro-esophageal reflux disease without esophagitis: Secondary | ICD-10-CM | POA: Diagnosis not present

## 2023-02-18 DIAGNOSIS — G629 Polyneuropathy, unspecified: Secondary | ICD-10-CM | POA: Diagnosis not present

## 2023-02-18 DIAGNOSIS — D591 Autoimmune hemolytic anemia, unspecified: Secondary | ICD-10-CM | POA: Diagnosis not present

## 2023-02-18 DIAGNOSIS — I5030 Unspecified diastolic (congestive) heart failure: Secondary | ICD-10-CM | POA: Diagnosis not present

## 2023-02-19 IMAGING — US US RENAL
1 series · 14 of 25 positions shown · non-contrast
Comparison: Prior ultrasound from 06/20/2005.

CLINICAL DATA: Initial evaluation for pyelonephritis.

EXAM:
RENAL / URINARY TRACT ULTRASOUND COMPLETE

[Series 1: us renal · 14 of 54 slices shown]
[im 1/54]
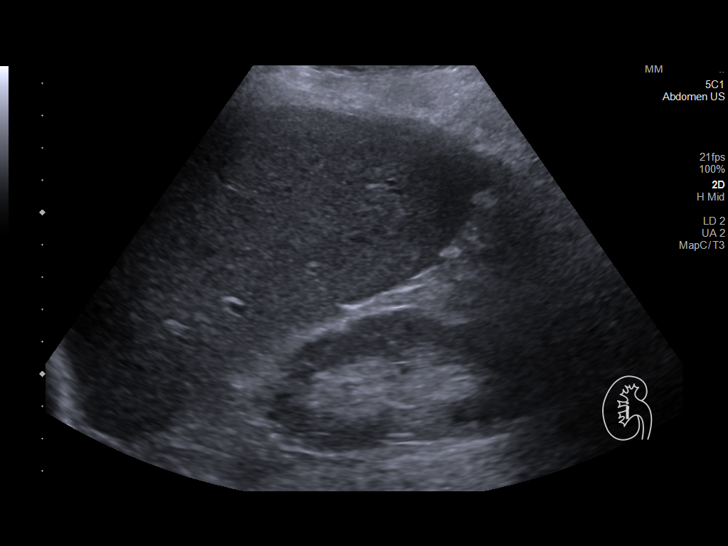
[im 5/54]
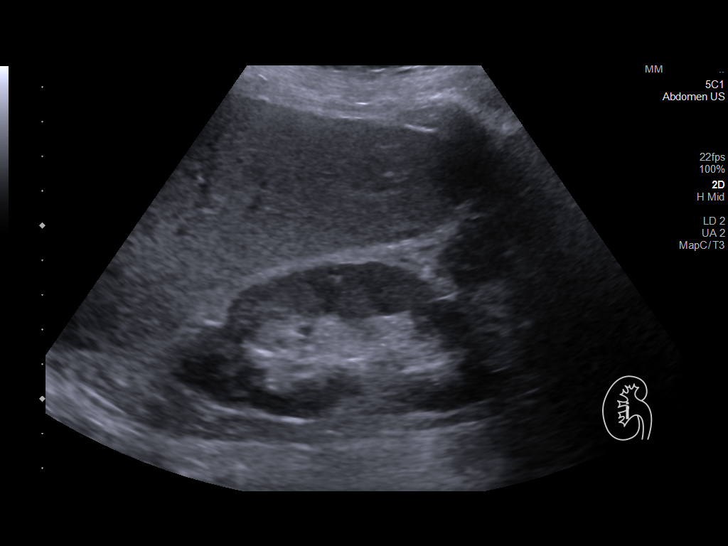
[im 9/54]
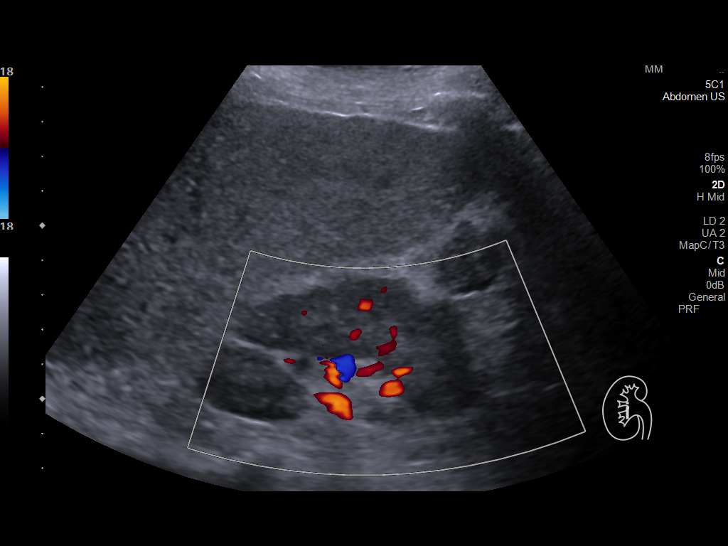
[im 14/54]
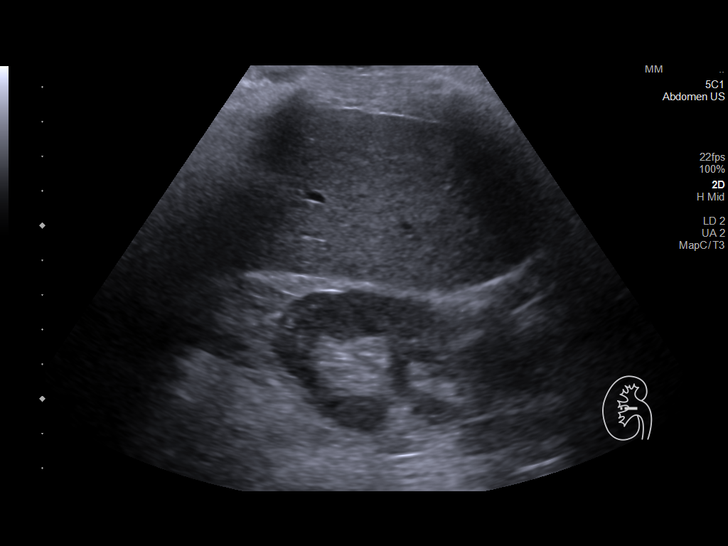
[im 18/54]
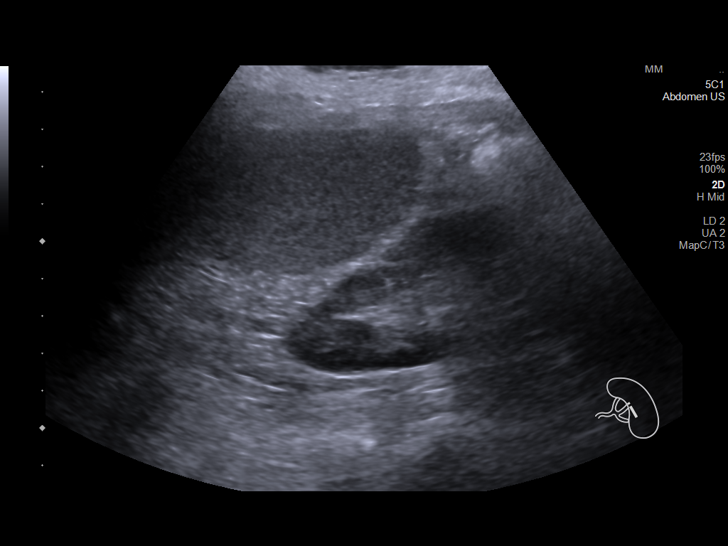
[im 20/54]
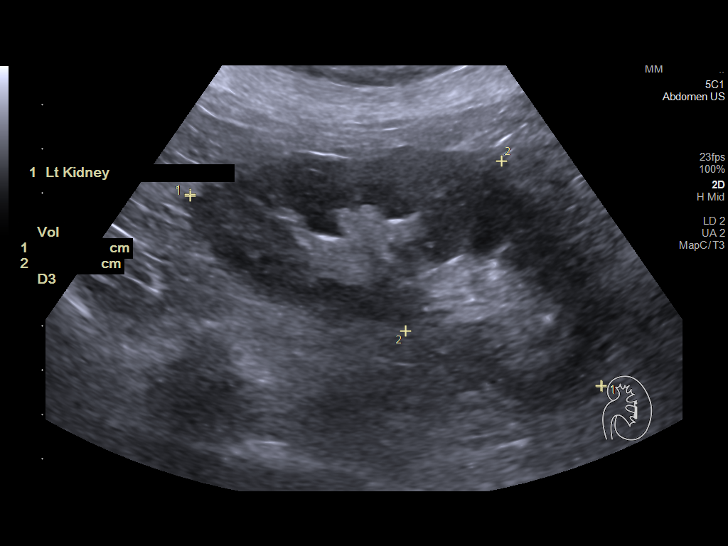
[im 25/54]
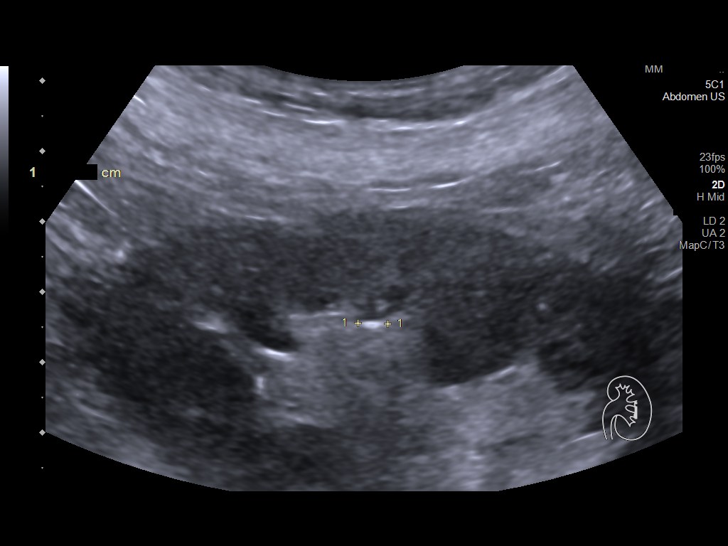
[im 29/54]
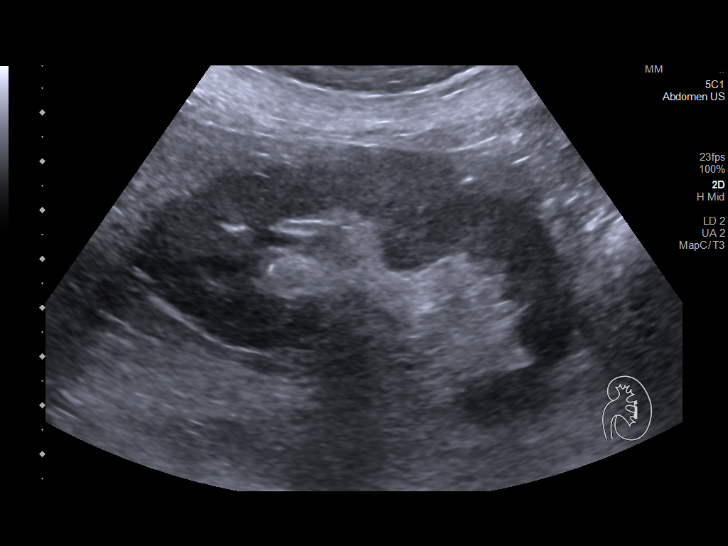
[im 34/54]
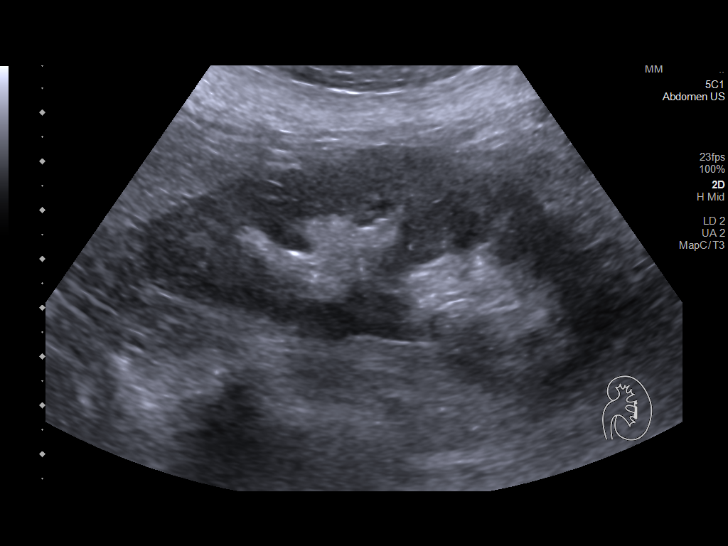
[im 36/54]
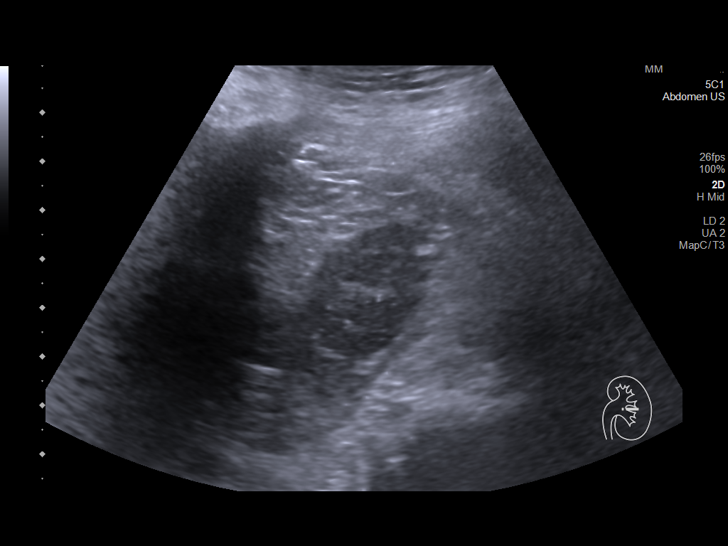
[im 40/54]
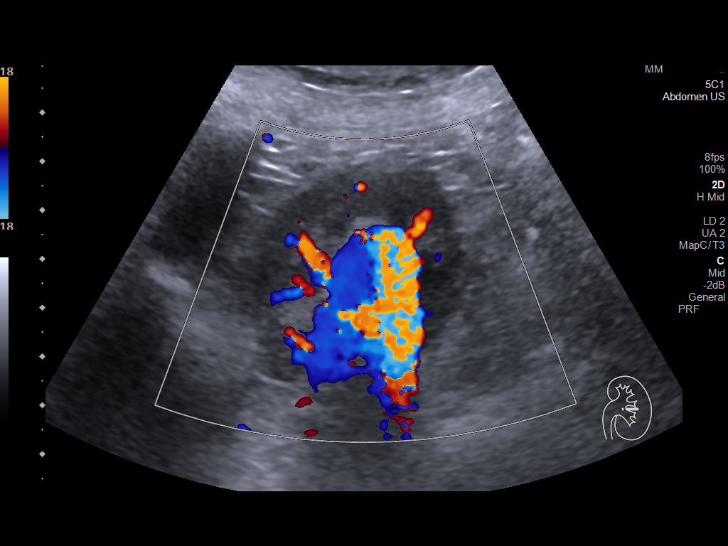
[im 45/54]
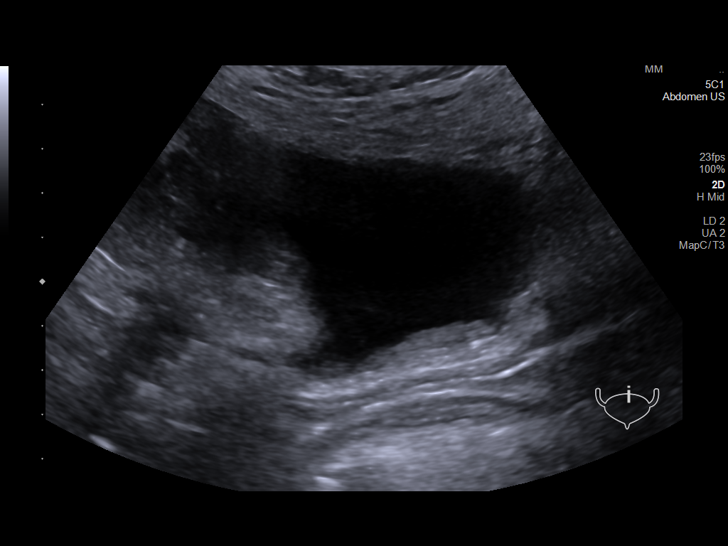
[im 49/54]
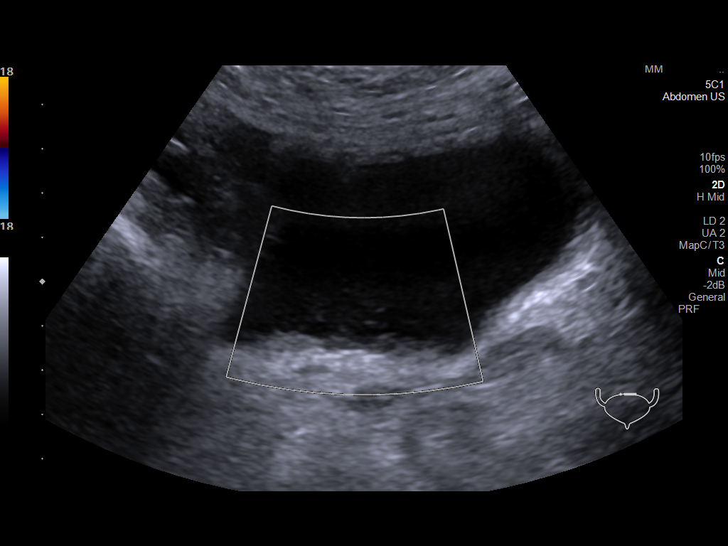
[im 54/54]
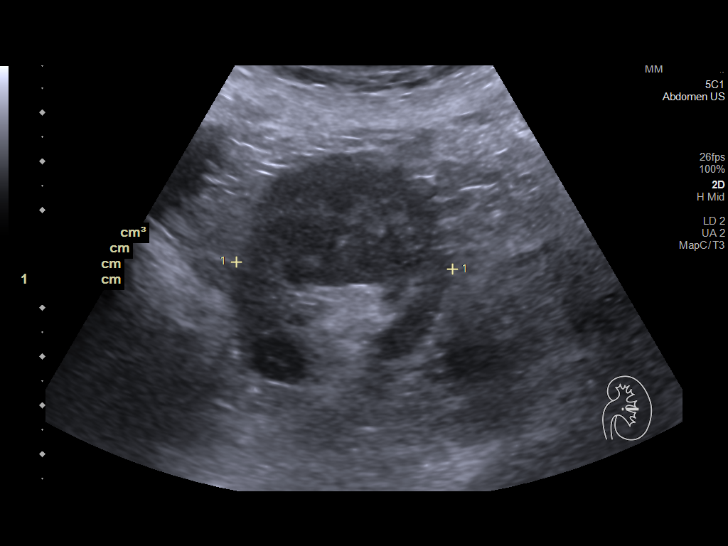

[14 of 25 positions shown; findings below may reference images not displayed]

FINDINGS: Right Kidney:

Renal measurements: 9.6 x 4.3 x 5.3 cm = volume: 113 mL. Renal
echogenicity within normal limits. No nephrolithiasis or
hydronephrosis. No focal renal mass.

Left Kidney:

Renal measurements: 10.2 x 4.4 x 4.4 cm = volume: 105 mL. Renal
echogenicity within normal limits. 4 mm nonobstructive calculus
present at the interpolar region. Additional 3 mm nonobstructive
stone present at the lower pole. No hydronephrosis. No focal renal
mass.

Bladder:

Scattered layering echogenic material seen within the bladder lumen,
which could reflect sedimentation and/or debris.

Other:

None.
IMPRESSION: 1. Nonobstructive left renal nephrolithiasis measuring up to 4 mm as
above. No hydronephrosis.
2. Scattered layering echogenic material within the bladder lumen,
which could reflect sedimentation and/or debris. Correlation with
urinalysis recommended.

## 2023-02-21 DIAGNOSIS — K219 Gastro-esophageal reflux disease without esophagitis: Secondary | ICD-10-CM | POA: Diagnosis not present

## 2023-02-21 DIAGNOSIS — G4089 Other seizures: Secondary | ICD-10-CM | POA: Diagnosis not present

## 2023-02-21 DIAGNOSIS — I5030 Unspecified diastolic (congestive) heart failure: Secondary | ICD-10-CM | POA: Diagnosis not present

## 2023-02-21 DIAGNOSIS — G629 Polyneuropathy, unspecified: Secondary | ICD-10-CM | POA: Diagnosis not present

## 2023-02-21 DIAGNOSIS — K5790 Diverticulosis of intestine, part unspecified, without perforation or abscess without bleeding: Secondary | ICD-10-CM | POA: Diagnosis not present

## 2023-02-21 DIAGNOSIS — D591 Autoimmune hemolytic anemia, unspecified: Secondary | ICD-10-CM | POA: Diagnosis not present

## 2023-02-24 DIAGNOSIS — D591 Autoimmune hemolytic anemia, unspecified: Secondary | ICD-10-CM | POA: Diagnosis not present

## 2023-02-24 DIAGNOSIS — K5790 Diverticulosis of intestine, part unspecified, without perforation or abscess without bleeding: Secondary | ICD-10-CM | POA: Diagnosis not present

## 2023-02-24 DIAGNOSIS — G4089 Other seizures: Secondary | ICD-10-CM | POA: Diagnosis not present

## 2023-02-24 DIAGNOSIS — K219 Gastro-esophageal reflux disease without esophagitis: Secondary | ICD-10-CM | POA: Diagnosis not present

## 2023-02-24 DIAGNOSIS — I5030 Unspecified diastolic (congestive) heart failure: Secondary | ICD-10-CM | POA: Diagnosis not present

## 2023-02-24 DIAGNOSIS — G629 Polyneuropathy, unspecified: Secondary | ICD-10-CM | POA: Diagnosis not present

## 2023-02-25 DIAGNOSIS — G629 Polyneuropathy, unspecified: Secondary | ICD-10-CM | POA: Diagnosis not present

## 2023-02-25 DIAGNOSIS — I5030 Unspecified diastolic (congestive) heart failure: Secondary | ICD-10-CM | POA: Diagnosis not present

## 2023-02-25 DIAGNOSIS — D591 Autoimmune hemolytic anemia, unspecified: Secondary | ICD-10-CM | POA: Diagnosis not present

## 2023-02-25 DIAGNOSIS — G4089 Other seizures: Secondary | ICD-10-CM | POA: Diagnosis not present

## 2023-02-25 DIAGNOSIS — K5790 Diverticulosis of intestine, part unspecified, without perforation or abscess without bleeding: Secondary | ICD-10-CM | POA: Diagnosis not present

## 2023-02-25 DIAGNOSIS — K219 Gastro-esophageal reflux disease without esophagitis: Secondary | ICD-10-CM | POA: Diagnosis not present

## 2023-02-28 DIAGNOSIS — G629 Polyneuropathy, unspecified: Secondary | ICD-10-CM | POA: Diagnosis not present

## 2023-02-28 DIAGNOSIS — G4089 Other seizures: Secondary | ICD-10-CM | POA: Diagnosis not present

## 2023-02-28 DIAGNOSIS — I5030 Unspecified diastolic (congestive) heart failure: Secondary | ICD-10-CM | POA: Diagnosis not present

## 2023-02-28 DIAGNOSIS — D591 Autoimmune hemolytic anemia, unspecified: Secondary | ICD-10-CM | POA: Diagnosis not present

## 2023-02-28 DIAGNOSIS — K5790 Diverticulosis of intestine, part unspecified, without perforation or abscess without bleeding: Secondary | ICD-10-CM | POA: Diagnosis not present

## 2023-02-28 DIAGNOSIS — K219 Gastro-esophageal reflux disease without esophagitis: Secondary | ICD-10-CM | POA: Diagnosis not present

## 2023-03-01 DIAGNOSIS — I5030 Unspecified diastolic (congestive) heart failure: Secondary | ICD-10-CM | POA: Diagnosis not present

## 2023-03-01 DIAGNOSIS — D591 Autoimmune hemolytic anemia, unspecified: Secondary | ICD-10-CM | POA: Diagnosis not present

## 2023-03-01 DIAGNOSIS — G4089 Other seizures: Secondary | ICD-10-CM | POA: Diagnosis not present

## 2023-03-01 DIAGNOSIS — K219 Gastro-esophageal reflux disease without esophagitis: Secondary | ICD-10-CM | POA: Diagnosis not present

## 2023-03-01 DIAGNOSIS — G629 Polyneuropathy, unspecified: Secondary | ICD-10-CM | POA: Diagnosis not present

## 2023-03-01 DIAGNOSIS — K5790 Diverticulosis of intestine, part unspecified, without perforation or abscess without bleeding: Secondary | ICD-10-CM | POA: Diagnosis not present

## 2023-03-03 DIAGNOSIS — I5030 Unspecified diastolic (congestive) heart failure: Secondary | ICD-10-CM | POA: Diagnosis not present

## 2023-03-03 DIAGNOSIS — G4089 Other seizures: Secondary | ICD-10-CM | POA: Diagnosis not present

## 2023-03-03 DIAGNOSIS — K5790 Diverticulosis of intestine, part unspecified, without perforation or abscess without bleeding: Secondary | ICD-10-CM | POA: Diagnosis not present

## 2023-03-03 DIAGNOSIS — G629 Polyneuropathy, unspecified: Secondary | ICD-10-CM | POA: Diagnosis not present

## 2023-03-03 DIAGNOSIS — D591 Autoimmune hemolytic anemia, unspecified: Secondary | ICD-10-CM | POA: Diagnosis not present

## 2023-03-03 DIAGNOSIS — K219 Gastro-esophageal reflux disease without esophagitis: Secondary | ICD-10-CM | POA: Diagnosis not present

## 2023-03-05 DIAGNOSIS — K5790 Diverticulosis of intestine, part unspecified, without perforation or abscess without bleeding: Secondary | ICD-10-CM | POA: Diagnosis not present

## 2023-03-05 DIAGNOSIS — G629 Polyneuropathy, unspecified: Secondary | ICD-10-CM | POA: Diagnosis not present

## 2023-03-05 DIAGNOSIS — G4089 Other seizures: Secondary | ICD-10-CM | POA: Diagnosis not present

## 2023-03-05 DIAGNOSIS — I5030 Unspecified diastolic (congestive) heart failure: Secondary | ICD-10-CM | POA: Diagnosis not present

## 2023-03-05 DIAGNOSIS — D591 Autoimmune hemolytic anemia, unspecified: Secondary | ICD-10-CM | POA: Diagnosis not present

## 2023-03-05 DIAGNOSIS — K219 Gastro-esophageal reflux disease without esophagitis: Secondary | ICD-10-CM | POA: Diagnosis not present

## 2023-03-06 DIAGNOSIS — I5030 Unspecified diastolic (congestive) heart failure: Secondary | ICD-10-CM | POA: Diagnosis not present

## 2023-03-06 DIAGNOSIS — K219 Gastro-esophageal reflux disease without esophagitis: Secondary | ICD-10-CM | POA: Diagnosis not present

## 2023-03-06 DIAGNOSIS — G629 Polyneuropathy, unspecified: Secondary | ICD-10-CM | POA: Diagnosis not present

## 2023-03-06 DIAGNOSIS — K5790 Diverticulosis of intestine, part unspecified, without perforation or abscess without bleeding: Secondary | ICD-10-CM | POA: Diagnosis not present

## 2023-03-06 DIAGNOSIS — G4089 Other seizures: Secondary | ICD-10-CM | POA: Diagnosis not present

## 2023-03-06 DIAGNOSIS — D591 Autoimmune hemolytic anemia, unspecified: Secondary | ICD-10-CM | POA: Diagnosis not present

## 2023-03-08 DIAGNOSIS — D591 Autoimmune hemolytic anemia, unspecified: Secondary | ICD-10-CM | POA: Diagnosis not present

## 2023-03-08 DIAGNOSIS — G4089 Other seizures: Secondary | ICD-10-CM | POA: Diagnosis not present

## 2023-03-08 DIAGNOSIS — G629 Polyneuropathy, unspecified: Secondary | ICD-10-CM | POA: Diagnosis not present

## 2023-03-08 DIAGNOSIS — K219 Gastro-esophageal reflux disease without esophagitis: Secondary | ICD-10-CM | POA: Diagnosis not present

## 2023-03-08 DIAGNOSIS — K5790 Diverticulosis of intestine, part unspecified, without perforation or abscess without bleeding: Secondary | ICD-10-CM | POA: Diagnosis not present

## 2023-03-08 DIAGNOSIS — I5030 Unspecified diastolic (congestive) heart failure: Secondary | ICD-10-CM | POA: Diagnosis not present

## 2023-03-09 DIAGNOSIS — K5790 Diverticulosis of intestine, part unspecified, without perforation or abscess without bleeding: Secondary | ICD-10-CM | POA: Diagnosis not present

## 2023-03-09 DIAGNOSIS — I5030 Unspecified diastolic (congestive) heart failure: Secondary | ICD-10-CM | POA: Diagnosis not present

## 2023-03-09 DIAGNOSIS — G4089 Other seizures: Secondary | ICD-10-CM | POA: Diagnosis not present

## 2023-03-09 DIAGNOSIS — G629 Polyneuropathy, unspecified: Secondary | ICD-10-CM | POA: Diagnosis not present

## 2023-03-09 DIAGNOSIS — D591 Autoimmune hemolytic anemia, unspecified: Secondary | ICD-10-CM | POA: Diagnosis not present

## 2023-03-09 DIAGNOSIS — K219 Gastro-esophageal reflux disease without esophagitis: Secondary | ICD-10-CM | POA: Diagnosis not present

## 2023-03-10 DIAGNOSIS — G4089 Other seizures: Secondary | ICD-10-CM | POA: Diagnosis not present

## 2023-03-10 DIAGNOSIS — D591 Autoimmune hemolytic anemia, unspecified: Secondary | ICD-10-CM | POA: Diagnosis not present

## 2023-03-10 DIAGNOSIS — K219 Gastro-esophageal reflux disease without esophagitis: Secondary | ICD-10-CM | POA: Diagnosis not present

## 2023-03-10 DIAGNOSIS — I5030 Unspecified diastolic (congestive) heart failure: Secondary | ICD-10-CM | POA: Diagnosis not present

## 2023-03-10 DIAGNOSIS — K5790 Diverticulosis of intestine, part unspecified, without perforation or abscess without bleeding: Secondary | ICD-10-CM | POA: Diagnosis not present

## 2023-03-10 DIAGNOSIS — G629 Polyneuropathy, unspecified: Secondary | ICD-10-CM | POA: Diagnosis not present

## 2023-03-11 DIAGNOSIS — G4089 Other seizures: Secondary | ICD-10-CM | POA: Diagnosis not present

## 2023-03-11 DIAGNOSIS — G629 Polyneuropathy, unspecified: Secondary | ICD-10-CM | POA: Diagnosis not present

## 2023-03-11 DIAGNOSIS — I5030 Unspecified diastolic (congestive) heart failure: Secondary | ICD-10-CM | POA: Diagnosis not present

## 2023-03-11 DIAGNOSIS — K5790 Diverticulosis of intestine, part unspecified, without perforation or abscess without bleeding: Secondary | ICD-10-CM | POA: Diagnosis not present

## 2023-03-11 DIAGNOSIS — K219 Gastro-esophageal reflux disease without esophagitis: Secondary | ICD-10-CM | POA: Diagnosis not present

## 2023-03-11 DIAGNOSIS — D591 Autoimmune hemolytic anemia, unspecified: Secondary | ICD-10-CM | POA: Diagnosis not present

## 2023-03-12 DIAGNOSIS — I5030 Unspecified diastolic (congestive) heart failure: Secondary | ICD-10-CM | POA: Diagnosis not present

## 2023-03-12 DIAGNOSIS — K219 Gastro-esophageal reflux disease without esophagitis: Secondary | ICD-10-CM | POA: Diagnosis not present

## 2023-03-12 DIAGNOSIS — G629 Polyneuropathy, unspecified: Secondary | ICD-10-CM | POA: Diagnosis not present

## 2023-03-12 DIAGNOSIS — D591 Autoimmune hemolytic anemia, unspecified: Secondary | ICD-10-CM | POA: Diagnosis not present

## 2023-03-12 DIAGNOSIS — G4089 Other seizures: Secondary | ICD-10-CM | POA: Diagnosis not present

## 2023-03-12 DIAGNOSIS — K5790 Diverticulosis of intestine, part unspecified, without perforation or abscess without bleeding: Secondary | ICD-10-CM | POA: Diagnosis not present

## 2023-03-13 DIAGNOSIS — G629 Polyneuropathy, unspecified: Secondary | ICD-10-CM | POA: Diagnosis not present

## 2023-03-13 DIAGNOSIS — K5790 Diverticulosis of intestine, part unspecified, without perforation or abscess without bleeding: Secondary | ICD-10-CM | POA: Diagnosis not present

## 2023-03-13 DIAGNOSIS — D591 Autoimmune hemolytic anemia, unspecified: Secondary | ICD-10-CM | POA: Diagnosis not present

## 2023-03-13 DIAGNOSIS — G4089 Other seizures: Secondary | ICD-10-CM | POA: Diagnosis not present

## 2023-03-13 DIAGNOSIS — I5030 Unspecified diastolic (congestive) heart failure: Secondary | ICD-10-CM | POA: Diagnosis not present

## 2023-03-13 DIAGNOSIS — K219 Gastro-esophageal reflux disease without esophagitis: Secondary | ICD-10-CM | POA: Diagnosis not present

## 2023-03-14 DIAGNOSIS — K219 Gastro-esophageal reflux disease without esophagitis: Secondary | ICD-10-CM | POA: Diagnosis not present

## 2023-03-14 DIAGNOSIS — G629 Polyneuropathy, unspecified: Secondary | ICD-10-CM | POA: Diagnosis not present

## 2023-03-14 DIAGNOSIS — G4089 Other seizures: Secondary | ICD-10-CM | POA: Diagnosis not present

## 2023-03-14 DIAGNOSIS — K5790 Diverticulosis of intestine, part unspecified, without perforation or abscess without bleeding: Secondary | ICD-10-CM | POA: Diagnosis not present

## 2023-03-14 DIAGNOSIS — D591 Autoimmune hemolytic anemia, unspecified: Secondary | ICD-10-CM | POA: Diagnosis not present

## 2023-03-14 DIAGNOSIS — I5030 Unspecified diastolic (congestive) heart failure: Secondary | ICD-10-CM | POA: Diagnosis not present

## 2023-03-15 DIAGNOSIS — I5030 Unspecified diastolic (congestive) heart failure: Secondary | ICD-10-CM | POA: Diagnosis not present

## 2023-03-15 DIAGNOSIS — G629 Polyneuropathy, unspecified: Secondary | ICD-10-CM | POA: Diagnosis not present

## 2023-03-15 DIAGNOSIS — D591 Autoimmune hemolytic anemia, unspecified: Secondary | ICD-10-CM | POA: Diagnosis not present

## 2023-03-15 DIAGNOSIS — G4089 Other seizures: Secondary | ICD-10-CM | POA: Diagnosis not present

## 2023-03-15 DIAGNOSIS — K219 Gastro-esophageal reflux disease without esophagitis: Secondary | ICD-10-CM | POA: Diagnosis not present

## 2023-03-15 DIAGNOSIS — K5790 Diverticulosis of intestine, part unspecified, without perforation or abscess without bleeding: Secondary | ICD-10-CM | POA: Diagnosis not present

## 2023-03-17 DIAGNOSIS — G629 Polyneuropathy, unspecified: Secondary | ICD-10-CM | POA: Diagnosis not present

## 2023-03-17 DIAGNOSIS — K219 Gastro-esophageal reflux disease without esophagitis: Secondary | ICD-10-CM | POA: Diagnosis not present

## 2023-03-17 DIAGNOSIS — G4089 Other seizures: Secondary | ICD-10-CM | POA: Diagnosis not present

## 2023-03-17 DIAGNOSIS — K5790 Diverticulosis of intestine, part unspecified, without perforation or abscess without bleeding: Secondary | ICD-10-CM | POA: Diagnosis not present

## 2023-03-17 DIAGNOSIS — D591 Autoimmune hemolytic anemia, unspecified: Secondary | ICD-10-CM | POA: Diagnosis not present

## 2023-03-17 DIAGNOSIS — I5030 Unspecified diastolic (congestive) heart failure: Secondary | ICD-10-CM | POA: Diagnosis not present

## 2023-03-18 DIAGNOSIS — I5030 Unspecified diastolic (congestive) heart failure: Secondary | ICD-10-CM | POA: Diagnosis not present

## 2023-03-18 DIAGNOSIS — D591 Autoimmune hemolytic anemia, unspecified: Secondary | ICD-10-CM | POA: Diagnosis not present

## 2023-03-18 DIAGNOSIS — K5790 Diverticulosis of intestine, part unspecified, without perforation or abscess without bleeding: Secondary | ICD-10-CM | POA: Diagnosis not present

## 2023-03-18 DIAGNOSIS — G4089 Other seizures: Secondary | ICD-10-CM | POA: Diagnosis not present

## 2023-03-18 DIAGNOSIS — K219 Gastro-esophageal reflux disease without esophagitis: Secondary | ICD-10-CM | POA: Diagnosis not present

## 2023-03-18 DIAGNOSIS — G629 Polyneuropathy, unspecified: Secondary | ICD-10-CM | POA: Diagnosis not present

## 2023-03-20 DEATH — deceased
